# Patient Record
Sex: Male | Born: 1940
Health system: Southern US, Community
[De-identification: ages and names within clinical notes are randomized; demographics above are authoritative.]

## PROBLEM LIST (undated history)

## (undated) DIAGNOSIS — I484 Atypical atrial flutter: Secondary | ICD-10-CM

## (undated) DIAGNOSIS — K5900 Constipation, unspecified: Secondary | ICD-10-CM

## (undated) DIAGNOSIS — G629 Polyneuropathy, unspecified: Secondary | ICD-10-CM

## (undated) DIAGNOSIS — I739 Peripheral vascular disease, unspecified: Secondary | ICD-10-CM

## (undated) DIAGNOSIS — I2510 Atherosclerotic heart disease of native coronary artery without angina pectoris: Secondary | ICD-10-CM

## (undated) DIAGNOSIS — I779 Disorder of arteries and arterioles, unspecified: Secondary | ICD-10-CM

## (undated) DIAGNOSIS — F419 Anxiety disorder, unspecified: Secondary | ICD-10-CM

## (undated) DIAGNOSIS — R011 Cardiac murmur, unspecified: Secondary | ICD-10-CM

## (undated) DIAGNOSIS — I10 Essential (primary) hypertension: Secondary | ICD-10-CM

## (undated) DIAGNOSIS — Z952 Presence of prosthetic heart valve: Secondary | ICD-10-CM

## (undated) DIAGNOSIS — M1710 Unilateral primary osteoarthritis, unspecified knee: Secondary | ICD-10-CM

## (undated) DIAGNOSIS — H9190 Unspecified hearing loss, unspecified ear: Secondary | ICD-10-CM

## (undated) DIAGNOSIS — I350 Nonrheumatic aortic (valve) stenosis: Secondary | ICD-10-CM

## (undated) HISTORY — DX: Polyneuropathy, unspecified: G62.9

## (undated) HISTORY — PX: COLONOSCOPY: SHX174

## (undated) HISTORY — DX: Cardiac murmur, unspecified: R01.1

## (undated) HISTORY — DX: Nonrheumatic aortic (valve) stenosis: I35.0

## (undated) HISTORY — PX: EYE SURGERY: SHX253

## (undated) HISTORY — DX: Peripheral vascular disease, unspecified: I73.9

## (undated) HISTORY — DX: Disorder of arteries and arterioles, unspecified: I77.9

## (undated) HISTORY — DX: Unilateral primary osteoarthritis, unspecified knee: M17.10

## (undated) HISTORY — DX: Essential (primary) hypertension: I10

## (undated) HISTORY — DX: Anxiety disorder, unspecified: F41.9

## (undated) HISTORY — DX: Atypical atrial flutter: I48.4

## (undated) HISTORY — PX: HERNIA REPAIR: SHX51

---

## 2001-02-07 ENCOUNTER — Other Ambulatory Visit: Admission: RE | Admit: 2001-02-07 | Discharge: 2001-02-07 | Payer: Self-pay | Admitting: Dermatology

## 2003-11-26 ENCOUNTER — Other Ambulatory Visit: Admission: RE | Admit: 2003-11-26 | Discharge: 2003-11-26 | Payer: Self-pay | Admitting: Dermatology

## 2005-04-30 ENCOUNTER — Ambulatory Visit: Payer: Self-pay | Admitting: Gastroenterology

## 2006-06-11 ENCOUNTER — Ambulatory Visit (HOSPITAL_COMMUNITY): Admission: RE | Admit: 2006-06-11 | Discharge: 2006-06-11 | Payer: Self-pay | Admitting: Family Medicine

## 2006-07-12 ENCOUNTER — Ambulatory Visit (HOSPITAL_COMMUNITY): Admission: RE | Admit: 2006-07-12 | Discharge: 2006-07-12 | Payer: Self-pay | Admitting: Family Medicine

## 2006-10-18 ENCOUNTER — Ambulatory Visit: Payer: Self-pay | Admitting: Emergency Medicine

## 2007-09-27 ENCOUNTER — Ambulatory Visit: Payer: Self-pay | Admitting: Family Medicine

## 2008-10-18 ENCOUNTER — Ambulatory Visit (HOSPITAL_COMMUNITY): Admission: RE | Admit: 2008-10-18 | Discharge: 2008-10-18 | Payer: Self-pay | Admitting: Family Medicine

## 2009-04-13 HISTORY — PX: NECK SURGERY: SHX720

## 2010-10-14 ENCOUNTER — Ambulatory Visit: Payer: Self-pay | Admitting: Internal Medicine

## 2011-09-06 ENCOUNTER — Ambulatory Visit: Payer: Self-pay | Admitting: Medical

## 2011-09-14 ENCOUNTER — Ambulatory Visit: Payer: Self-pay

## 2013-02-09 ENCOUNTER — Ambulatory Visit: Payer: Self-pay

## 2013-08-09 ENCOUNTER — Ambulatory Visit: Payer: Self-pay | Admitting: Ophthalmology

## 2013-09-06 ENCOUNTER — Ambulatory Visit: Payer: Self-pay | Admitting: Ophthalmology

## 2013-11-06 LAB — HEMOGLOBIN: Hemoglobin: 14.6 (ref 13–17)

## 2013-11-06 LAB — LIPID PANEL
Chol/HDL Ratio: 2.4
Cholesterol, Total: 171 (ref ?–200)
HDL Cholesterol: 72 (ref 39–?)
LDL Cholesterol: 90 mg/dL (ref ?–100)
Triglycerides: 45 (ref ?–150)
VLDL: 9 mg/dL (ref ?–31)

## 2013-11-06 LAB — PSA: PSA: 2.3 (ref ?–4)

## 2013-11-06 LAB — TESTOSTERONE: Testosterone, Serum (Total): 495 (ref 300–890)

## 2013-12-13 ENCOUNTER — Encounter: Payer: Self-pay | Admitting: Podiatry

## 2013-12-13 ENCOUNTER — Other Ambulatory Visit: Payer: Self-pay | Admitting: *Deleted

## 2013-12-13 ENCOUNTER — Ambulatory Visit (INDEPENDENT_AMBULATORY_CARE_PROVIDER_SITE_OTHER): Payer: Medicare Other | Admitting: Podiatry

## 2013-12-13 VITALS — BP 137/73 | HR 75 | Resp 16 | Ht 74.0 in | Wt 182.0 lb

## 2013-12-13 DIAGNOSIS — M216X9 Other acquired deformities of unspecified foot: Secondary | ICD-10-CM

## 2013-12-13 DIAGNOSIS — M722 Plantar fascial fibromatosis: Secondary | ICD-10-CM

## 2013-12-13 DIAGNOSIS — M2040 Other hammer toe(s) (acquired), unspecified foot: Secondary | ICD-10-CM

## 2013-12-13 DIAGNOSIS — M2020 Hallux rigidus, unspecified foot: Secondary | ICD-10-CM

## 2013-12-13 DIAGNOSIS — M205X9 Other deformities of toe(s) (acquired), unspecified foot: Secondary | ICD-10-CM

## 2013-12-13 NOTE — Progress Notes (Signed)
   Subjective:    Patient ID: James Cardenas, male    DOB: 03-17-1941, 73 y.o.   MRN: [id number]  HPI Comments: Need another pair of orthotics the last pair was made in 2013 , switch them out a lot in different shoes. Had a question about the fungus.  Been using formula 3 on them , denies any pain in feet      Review of Systems     Objective:   Physical Exam: I have reviewed his past mental history medications allergies surgeries social history and review of systems. Pulses are strongly palpable bilateral. Neurologic sensorium is intact per since once the monofilament. Deep tendon reflexes are intact bilateral muscle strength is 5 over 5 dorsiflexors plantar flexors inverters everters all intrinsic musculature is intact. Orthopedic evaluation demonstrates all joints distal to the ankle a full range of motion without crepitation. Cutaneous evaluation demonstrates supple well hydrated cutis. Cavus foot deformity with hammertoe deformities are noted bilateral history of plantar fasciitis is present but not symptomatically this point.        Assessment & Plan:  Assessment: History of plantar fasciitis cavus foot deformity with hammertoe deformities bilateral.  Plan: New set up orthotics were scan today.

## 2014-01-01 ENCOUNTER — Encounter: Payer: Self-pay | Admitting: *Deleted

## 2014-01-17 ENCOUNTER — Ambulatory Visit (INDEPENDENT_AMBULATORY_CARE_PROVIDER_SITE_OTHER): Payer: Medicare Other | Admitting: Podiatry

## 2014-01-17 DIAGNOSIS — M722 Plantar fascial fibromatosis: Secondary | ICD-10-CM

## 2014-01-17 NOTE — Progress Notes (Signed)
Pt presents for orthotic pick up , patient states and feels good

## 2014-01-24 DIAGNOSIS — I10 Essential (primary) hypertension: Secondary | ICD-10-CM | POA: Insufficient documentation

## 2014-01-24 DIAGNOSIS — R011 Cardiac murmur, unspecified: Secondary | ICD-10-CM | POA: Insufficient documentation

## 2015-11-09 LAB — BASIC METABOLIC PANEL
BUN/Creatinine Ratio: 22
BUN: 20
Calcium: 8.9 mg/dL
Carbon Dioxide, Total: 25
Chloride: 101 mmol/L
Creat: 0.89
EGFR (Non-African Amer.): 84
Glucose: 105 — AB (ref ?–99)
Potassium: 4.5 mmol/L
Sodium: 139

## 2015-11-09 LAB — HEMOGLOBIN A1C: Hemoglobin A1C: 5.4 (ref 4.8–5.6)

## 2016-03-11 ENCOUNTER — Encounter: Payer: Self-pay | Admitting: Urology

## 2016-03-11 ENCOUNTER — Ambulatory Visit (INDEPENDENT_AMBULATORY_CARE_PROVIDER_SITE_OTHER): Payer: Medicare Other | Admitting: Urology

## 2016-03-11 ENCOUNTER — Other Ambulatory Visit: Payer: Self-pay

## 2016-03-11 VITALS — BP 156/78 | HR 77 | Ht 74.0 in | Wt 200.0 lb

## 2016-03-11 DIAGNOSIS — N400 Enlarged prostate without lower urinary tract symptoms: Secondary | ICD-10-CM | POA: Insufficient documentation

## 2016-03-11 DIAGNOSIS — N3281 Overactive bladder: Secondary | ICD-10-CM

## 2016-03-11 DIAGNOSIS — N5201 Erectile dysfunction due to arterial insufficiency: Secondary | ICD-10-CM | POA: Diagnosis not present

## 2016-03-11 DIAGNOSIS — N401 Enlarged prostate with lower urinary tract symptoms: Secondary | ICD-10-CM | POA: Diagnosis not present

## 2016-03-11 DIAGNOSIS — R350 Frequency of micturition: Secondary | ICD-10-CM | POA: Diagnosis not present

## 2016-03-11 LAB — BLADDER SCAN AMB NON-IMAGING: Scan Result: 37

## 2016-03-11 MED ORDER — MIRABEGRON ER 25 MG PO TB24: 25.0000 mg | ORAL_TABLET | Freq: Every day | ORAL | 11 refills | Status: DC

## 2016-03-11 NOTE — Progress Notes (Signed)
03/11/2016 11:05 AM   James Cardenas 09-Apr-1941 [id number]  Referring provider: Mariana Arn, MD [address] [place], [place] [address]  Chief Complaint  Patient presents with  . New Patient (Initial Visit)    Frequency    HPI: The patient is a 75 year old gentleman presents today to discuss his BPH with lower urinary tract symptoms. He is currently on Flomax 0.8 mg daily. He was on Flomax 0.4 mg daily this was increased about 1 week ago. He saw no change in his symptoms. His symptoms include urinary frequency up to 10 times per day, urgency with urge incontinence, nocturia 3. He denies any hesitancy, weak stream, intermittency, or having to strain to urinate.  He also takes generic sildenafil for erectile dysfunction which works well for him.   PMH: Past Medical History:  Diagnosis Date  . Hypertension     Surgical History: Past Surgical History:  Procedure Laterality Date  . NECK SURGERY      Home Medications:    Medication List       Accurate as of 03/11/16 11:05 AM. Always use your most recent med list.          ALPRAZolam 0.5 MG tablet Commonly known as:  XANAX TAKE 1 TABLET BY MOUTH 3 TIMES A DAY AS NEEDED FOR ANXIETY   aspirin EC 81 MG tablet Take by mouth.   losartan 50 MG tablet Commonly known as:  COZAAR Take 50 mg by mouth daily.   MULTI-VITAMINS Tabs Take by mouth.   Omega-3 1000 MG Caps Take by mouth.   sildenafil 20 MG tablet Commonly known as:  REVATIO Take 20 mg by mouth 3 (three) times daily.   tamsulosin 0.4 MG Caps capsule Commonly known as:  FLOMAX Take 0.8 mg by mouth daily.   Vitamin B 12 100 MCG Lozg Take by mouth.       Allergies: No Known Allergies  Family History: Family History  Problem Relation Age of Onset  . Prostate cancer Neg Hx   . Bladder Cancer Neg Hx   . Kidney cancer Neg Hx     Social History:  reports that he has never smoked. He has never used smokeless tobacco. He reports that he drinks  about 4.2 oz of alcohol per week . He reports that he does not use drugs.  ROS: UROLOGY Frequent Urination?: Yes Hard to postpone urination?: No Burning/pain with urination?: No Get up at night to urinate?: Yes Leakage of urine?: Yes Urine stream starts and stops?: No Trouble starting stream?: No Do you have to strain to urinate?: No Blood in urine?: No Urinary tract infection?: No Sexually transmitted disease?: No Injury to kidneys or bladder?: No Painful intercourse?: No Weak stream?: No Erection problems?: Yes Penile pain?: No  Gastrointestinal Nausea?: No Vomiting?: No Indigestion/heartburn?: No Diarrhea?: No Constipation?: No  Constitutional Fever: No Night sweats?: No Weight loss?: No Fatigue?: No  Skin Skin rash/lesions?: No Itching?: No  Eyes Blurred vision?: No Double vision?: No  Ears/Nose/Throat Sore throat?: No Sinus problems?: No  Hematologic/Lymphatic Swollen glands?: No Easy bruising?: No  Cardiovascular Leg swelling?: No Chest pain?: No  Respiratory Cough?: No Shortness of breath?: No  Endocrine Excessive thirst?: No  Musculoskeletal Back pain?: No Joint pain?: No  Neurological Headaches?: No Dizziness?: No  Psychologic Depression?: No Anxiety?: Yes  Physical Exam: BP (!) 156/78 (BP Location: Left Arm, Patient Position: Sitting, Cuff Size: Normal)   Pulse 77   Ht 6\' 2"  (1.88 m)   Wt  200 lb (90.7 kg)   BMI 25.68 kg/m   Constitutional:  Alert and oriented, No acute distress. HEENT: [place] AT, moist mucus membranes.  Trachea midline, no masses. Cardiovascular: No clubbing, cyanosis, or edema. Respiratory: Normal respiratory effort, no increased work of breathing. GI: Abdomen is soft, nontender, nondistended, no abdominal masses GU: No CVA tenderness. Normal phallus. Testicles descended bilaterally no masses. DRE: 2+ smooth benign. Skin: No rashes, bruises or suspicious lesions. Lymph: No cervical or inguinal  adenopathy. Neurologic: Grossly intact, no focal deficits, moving all 4 extremities. Psychiatric: Normal mood and affect.  Laboratory Data: No results found for: WBC, HGB, HCT, MCV, PLT  No results found for: CREATININE  No results found for: PSA  No results found for: TESTOSTERONE  No results found for: HGBA1C  Urinalysis No results found for: COLORURINE, APPEARANCEUR, LABSPEC, PHURINE, GLUCOSEU, HGBUR, BILIRUBINUR, KETONESUR, PROTEINUR, UROBILINOGEN, NITRITE, LEUKOCYTESUR   Assessment & Plan:    1. OAB Based on the patient's symptomatology, especially his urge incontinence,  are more consistant with an overactive bladder. We will try him on Myrbetriq 25 mg daily for this condition. He'll contact the office of this medication is too expensive to fill. A follow-up in 3 months to assess his progress. He will continue his Flomax and 0.4 mg a day since he did see some improvement in his symptoms with that but noticed no change with Flomax 0.8 mg.  2. BPH As above  3. ED Continue silidenfil per PCP  No Follow-up on file.  Nickie Retort, MD  [facility] [address] [place], [place] [address] [phone]

## 2016-04-14 ENCOUNTER — Other Ambulatory Visit: Payer: Self-pay | Admitting: Family Medicine

## 2016-04-14 MED ORDER — TAMSULOSIN HCL 0.4 MG PO CAPS: 0.8000 mg | ORAL_CAPSULE | Freq: Every day | ORAL | 3 refills | Status: DC

## 2016-04-15 ENCOUNTER — Ambulatory Visit (INDEPENDENT_AMBULATORY_CARE_PROVIDER_SITE_OTHER): Payer: Medicare Other

## 2016-04-15 ENCOUNTER — Encounter: Payer: Self-pay | Admitting: Podiatry

## 2016-04-15 ENCOUNTER — Ambulatory Visit (INDEPENDENT_AMBULATORY_CARE_PROVIDER_SITE_OTHER): Payer: Medicare Other | Admitting: Podiatry

## 2016-04-15 VITALS — BP 147/81 | HR 62 | Resp 16

## 2016-04-15 DIAGNOSIS — B351 Tinea unguium: Secondary | ICD-10-CM

## 2016-04-15 DIAGNOSIS — M722 Plantar fascial fibromatosis: Secondary | ICD-10-CM

## 2016-04-15 DIAGNOSIS — M79676 Pain in unspecified toe(s): Secondary | ICD-10-CM

## 2016-04-15 NOTE — Progress Notes (Signed)
   Subjective:    Patient ID: James Cardenas, male    DOB: 07/30/1940, 76 y.o.   MRN: [id number]  HPI: He presents today requesting new orthotics and requesting to have helped trimming his toenails. He has a history of plantar fasciitis and hammertoe deformity which she would like to have help with is well.  Review of Systems  Genitourinary: Positive for frequency.  Musculoskeletal: Positive for arthralgias.  All other systems reviewed and are negative.      Objective:   Physical Exam: Vital signs are stable alert and oriented 3. Pulses are palpable. Neurologic sensorium is intact deep tendon reflexes are intact muscle strength is normal bilateral. Orthopedic evaluation demonstrates rigid hammertoe deformities mild HAV deformities bilateral. Reactive tyloma was last flex to the bilateral foot. Mild tenderness on palpation plantar fascial calcaneal exercise bilateral. Radiographs demonstrate hammertoe deformities plan flex metatarsals.     Assessment & Plan:  Assessment: Hammertoe deformities plantar fasciitis bilateral. Painful elongated toenails bilateral.  Plan: Debridement of toenails 1 through 5 bilateral. He was rescanned for per orthotics today.

## 2016-05-04 ENCOUNTER — Other Ambulatory Visit: Payer: Self-pay

## 2016-05-04 DIAGNOSIS — N3281 Overactive bladder: Secondary | ICD-10-CM

## 2016-05-04 MED ORDER — MIRABEGRON ER 25 MG PO TB24: 25.0000 mg | ORAL_TABLET | Freq: Every day | ORAL | 3 refills | Status: DC

## 2016-05-28 DIAGNOSIS — R011 Cardiac murmur, unspecified: Secondary | ICD-10-CM | POA: Diagnosis not present

## 2016-05-28 DIAGNOSIS — N528 Other male erectile dysfunction: Secondary | ICD-10-CM | POA: Diagnosis not present

## 2016-05-28 DIAGNOSIS — I10 Essential (primary) hypertension: Secondary | ICD-10-CM | POA: Diagnosis not present

## 2016-05-28 DIAGNOSIS — N529 Male erectile dysfunction, unspecified: Secondary | ICD-10-CM | POA: Insufficient documentation

## 2016-05-28 DIAGNOSIS — R600 Localized edema: Secondary | ICD-10-CM | POA: Diagnosis not present

## 2016-05-28 DIAGNOSIS — N400 Enlarged prostate without lower urinary tract symptoms: Secondary | ICD-10-CM | POA: Diagnosis not present

## 2016-05-28 DIAGNOSIS — F411 Generalized anxiety disorder: Secondary | ICD-10-CM | POA: Diagnosis not present

## 2016-05-28 DIAGNOSIS — N3281 Overactive bladder: Secondary | ICD-10-CM | POA: Diagnosis not present

## 2016-05-31 DIAGNOSIS — M1611 Unilateral primary osteoarthritis, right hip: Secondary | ICD-10-CM | POA: Diagnosis not present

## 2016-05-31 DIAGNOSIS — M25651 Stiffness of right hip, not elsewhere classified: Secondary | ICD-10-CM | POA: Diagnosis not present

## 2016-05-31 DIAGNOSIS — M1711 Unilateral primary osteoarthritis, right knee: Secondary | ICD-10-CM | POA: Diagnosis not present

## 2016-05-31 DIAGNOSIS — M25561 Pain in right knee: Secondary | ICD-10-CM | POA: Diagnosis not present

## 2016-06-11 ENCOUNTER — Encounter: Payer: Self-pay | Admitting: Urology

## 2016-06-11 ENCOUNTER — Ambulatory Visit (INDEPENDENT_AMBULATORY_CARE_PROVIDER_SITE_OTHER): Payer: Medicare Other | Admitting: Urology

## 2016-06-11 VITALS — BP 156/83 | HR 66 | Ht 74.0 in | Wt 203.1 lb

## 2016-06-11 DIAGNOSIS — N401 Enlarged prostate with lower urinary tract symptoms: Secondary | ICD-10-CM

## 2016-06-11 DIAGNOSIS — N3281 Overactive bladder: Secondary | ICD-10-CM | POA: Diagnosis not present

## 2016-06-11 DIAGNOSIS — N529 Male erectile dysfunction, unspecified: Secondary | ICD-10-CM | POA: Diagnosis not present

## 2016-06-11 LAB — URINALYSIS, COMPLETE
Bilirubin, UA: NEGATIVE
Glucose, UA: NEGATIVE
Ketones, UA: NEGATIVE
Leukocytes, UA: NEGATIVE
Nitrite, UA: NEGATIVE
Protein, UA: NEGATIVE
Specific Gravity, UA: 1.025 (ref 1.005–1.030)
Urobilinogen, Ur: 1 mg/dL (ref 0.2–1.0)
pH, UA: 5.5 (ref 5.0–7.5)

## 2016-06-11 LAB — MICROSCOPIC EXAMINATION
Bacteria, UA: NONE SEEN
Epithelial Cells (non renal): NONE SEEN /hpf (ref 0–10)

## 2016-06-11 NOTE — Progress Notes (Signed)
06/11/2016 11:32 AM   James Cardenas [birth year] [id number]  Referring provider: Sharilyn Sites, MD [address] [place], [place] [address]  Chief Complaint  Patient presents with  . Follow-up    BPH w/lower UTS    HPI: The patient is a 76 year old gentleman presents today to discuss his BPH with lower urinary tract symptoms. He is currently on Flomax 0.8 mg daily. He was on Flomax 0.4 mg daily this was increased about 1 week ago. He saw no change in his symptoms. His symptoms include urinary frequency up to 10 times per day, urgency with urge incontinence, nocturia 3. He denies any hesitancy, weak stream, intermittency, or having to strain to urinate. DRE was 2+ benign. He was started on Myrbetriq 25 mg at his last visit for these symptoms.He has noted dramatic improvement with this medication. I PSS score today now is 3/1. He has minimal urgency and nocturia 1-2. He is very pleased with the change this medication has provider. His urge incontinence has essentially resolved. He decreased his Flomax to 0.4 mg as it was not helping him. He still does not think this medication has ever helped with his issues and is interested in stopping it today.  He also takes generic sildenafil for erectile dysfunction which works well for him.     PMH: Past Medical History:  Diagnosis Date  . Hypertension     Surgical History: Past Surgical History:  Procedure Laterality Date  . NECK SURGERY      Home Medications:  Allergies as of 06/11/2016   No Known Allergies     Medication List       Accurate as of 06/11/16 11:32 AM. Always use your most recent med list.          ALPRAZolam 0.5 MG tablet Commonly known as:  XANAX TAKE 1 TABLET BY MOUTH 3 TIMES A DAY AS NEEDED FOR ANXIETY   aspirin EC 81 MG tablet Take by mouth.   FINACEA 15 % Foam Generic drug:  Azelaic Acid Apply AS directed TO FACE daily   losartan 50 MG tablet Commonly known as:  COZAAR Take 50 mg by mouth  daily.   mirabegron ER 25 MG Tb24 tablet Commonly known as:  MYRBETRIQ Take 1 tablet (25 mg total) by mouth daily.   MULTI-VITAMINS Tabs Take by mouth.   Omega-3 1000 MG Caps Take by mouth.   sildenafil 20 MG tablet Commonly known as:  REVATIO Take 20 mg by mouth 3 (three) times daily.   tamsulosin 0.4 MG Caps capsule Commonly known as:  FLOMAX Take 2 capsules (0.8 mg total) by mouth daily.   Vitamin B 12 100 MCG Lozg Take by mouth.       Allergies: No Known Allergies  Family History: Family History  Problem Relation Age of Onset  . Prostate cancer Neg Hx   . Bladder Cancer Neg Hx   . Kidney cancer Neg Hx     Social History:  reports that he has never smoked. He has never used smokeless tobacco. He reports that he drinks about 4.2 oz of alcohol per week . He reports that he does not use drugs.  ROS: UROLOGY Frequent Urination?: No Hard to postpone urination?: No Burning/pain with urination?: No Get up at night to urinate?: Yes Leakage of urine?: No Urine stream starts and stops?: No Trouble starting stream?: No Do you have to strain to urinate?: No Blood in urine?: No Urinary tract infection?: No Sexually transmitted disease?: No Injury to  kidneys or bladder?: No Painful intercourse?: No Weak stream?: No Erection problems?: Yes Penile pain?: No  Gastrointestinal Nausea?: No Vomiting?: No Indigestion/heartburn?: No Diarrhea?: No Constipation?: Yes  Constitutional Fever: No Night sweats?: No Weight loss?: No Fatigue?: No  Skin Skin rash/lesions?: No Itching?: No  Eyes Blurred vision?: No Double vision?: No  Ears/Nose/Throat Sore throat?: No Sinus problems?: No  Hematologic/Lymphatic Swollen glands?: No Easy bruising?: No  Cardiovascular Leg swelling?: No Chest pain?: No  Respiratory Cough?: No Shortness of breath?: No  Endocrine Excessive thirst?: No  Musculoskeletal Back pain?: No Joint pain?:  No  Neurological Headaches?: No Dizziness?: No  Psychologic Depression?: No Anxiety?: Yes  Physical Exam: BP (!) 156/83   Pulse 66   Ht 6\' 2"  (1.88 m)   Wt 203 lb 1.6 oz (92.1 kg)   BMI 26.08 kg/m   Constitutional:  Alert and oriented, No acute distress. HEENT: [place] AT, moist mucus membranes.  Trachea midline, no masses. Cardiovascular: No clubbing, cyanosis, or edema. Respiratory: Normal respiratory effort, no increased work of breathing. GI: Abdomen is soft, nontender, nondistended, no abdominal masses GU: No CVA tenderness.  Skin: No rashes, bruises or suspicious lesions. Lymph: No cervical or inguinal adenopathy. Neurologic: Grossly intact, no focal deficits, moving all 4 extremities. Psychiatric: Normal mood and affect.  Laboratory Data: No results found for: WBC, HGB, HCT, MCV, PLT  No results found for: CREATININE  No results found for: PSA  No results found for: TESTOSTERONE  No results found for: HGBA1C  Urinalysis No results found for: COLORURINE, APPEARANCEUR, LABSPEC, PHURINE, GLUCOSEU, HGBUR, BILIRUBINUR, KETONESUR, PROTEINUR, UROBILINOGEN, NITRITE, LEUKOCYTESUR    Assessment & Plan:    1. OAB Continue Myrbetriq 25 mg daily  2. BPH This medication has not helped the patient's symptoms and he would like to stop it at this time. I think this is reasonable.  3. ED Continue silidenfil per PCP   Return in about 1 year (around 06/11/2017).  Nickie Retort, MD  [facility] [address] [place], [place] [address] [phone]

## 2016-06-22 DIAGNOSIS — M1711 Unilateral primary osteoarthritis, right knee: Secondary | ICD-10-CM | POA: Diagnosis not present

## 2016-07-02 ENCOUNTER — Encounter: Payer: Self-pay | Admitting: Family Medicine

## 2016-07-02 ENCOUNTER — Ambulatory Visit (INDEPENDENT_AMBULATORY_CARE_PROVIDER_SITE_OTHER): Payer: Medicare Other | Admitting: Family Medicine

## 2016-07-02 ENCOUNTER — Other Ambulatory Visit: Payer: Self-pay | Admitting: *Deleted

## 2016-07-02 VITALS — BP 144/71 | HR 59 | Temp 97.7°F | Resp 16 | Ht 74.0 in | Wt 202.0 lb

## 2016-07-02 DIAGNOSIS — K5909 Other constipation: Secondary | ICD-10-CM

## 2016-07-02 DIAGNOSIS — R011 Cardiac murmur, unspecified: Secondary | ICD-10-CM | POA: Diagnosis not present

## 2016-07-02 DIAGNOSIS — M150 Primary generalized (osteo)arthritis: Secondary | ICD-10-CM

## 2016-07-02 DIAGNOSIS — G609 Hereditary and idiopathic neuropathy, unspecified: Secondary | ICD-10-CM | POA: Diagnosis not present

## 2016-07-02 DIAGNOSIS — F418 Other specified anxiety disorders: Secondary | ICD-10-CM | POA: Insufficient documentation

## 2016-07-02 DIAGNOSIS — Z23 Encounter for immunization: Secondary | ICD-10-CM

## 2016-07-02 DIAGNOSIS — K5900 Constipation, unspecified: Secondary | ICD-10-CM | POA: Insufficient documentation

## 2016-07-02 DIAGNOSIS — M1711 Unilateral primary osteoarthritis, right knee: Secondary | ICD-10-CM | POA: Insufficient documentation

## 2016-07-02 DIAGNOSIS — N3281 Overactive bladder: Secondary | ICD-10-CM

## 2016-07-02 DIAGNOSIS — N400 Enlarged prostate without lower urinary tract symptoms: Secondary | ICD-10-CM | POA: Diagnosis not present

## 2016-07-02 DIAGNOSIS — M8949 Other hypertrophic osteoarthropathy, multiple sites: Secondary | ICD-10-CM

## 2016-07-02 DIAGNOSIS — Z7689 Persons encountering health services in other specified circumstances: Secondary | ICD-10-CM

## 2016-07-02 DIAGNOSIS — I7090 Unspecified atherosclerosis: Secondary | ICD-10-CM | POA: Insufficient documentation

## 2016-07-02 DIAGNOSIS — R600 Localized edema: Secondary | ICD-10-CM | POA: Insufficient documentation

## 2016-07-02 DIAGNOSIS — I10 Essential (primary) hypertension: Secondary | ICD-10-CM

## 2016-07-02 DIAGNOSIS — G629 Polyneuropathy, unspecified: Secondary | ICD-10-CM | POA: Insufficient documentation

## 2016-07-02 DIAGNOSIS — R7301 Impaired fasting glucose: Secondary | ICD-10-CM

## 2016-07-02 DIAGNOSIS — N529 Male erectile dysfunction, unspecified: Secondary | ICD-10-CM | POA: Diagnosis not present

## 2016-07-02 DIAGNOSIS — M159 Polyosteoarthritis, unspecified: Secondary | ICD-10-CM | POA: Insufficient documentation

## 2016-07-02 MED ORDER — LOSARTAN POTASSIUM 100 MG PO TABS: 100.0000 mg | ORAL_TABLET | Freq: Every day | ORAL | 3 refills | Status: DC

## 2016-07-02 MED ORDER — SILDENAFIL CITRATE 20 MG PO TABS: ORAL_TABLET | ORAL | 11 refills | Status: DC

## 2016-07-02 NOTE — Assessment & Plan Note (Signed)
Suspected functional constipation, likely med side effects on Myrbetriq - Recommend improve lifestyle, hydration, fiber - Can try OTC Docusate stool softener nightly 100mg , then if prefer can advance to Miralax OTC half to 1 cap powder full glass water, daily or maintenance or PRN adjustment

## 2016-07-02 NOTE — Assessment & Plan Note (Signed)
Stable chronic problem Now patient concern with reported increased murmur in past 2 years Last ECHO 2015 (LVEF 55%, mild MR, Moderate AS and AR) Will follow-up in future, consider repeat ECHO now in 2018

## 2016-07-02 NOTE — Assessment & Plan Note (Signed)
Consistent with likely age-related, possibly with HTN. Also consider possible neurogenic if has peripheral neuropathy. Otheriwse no DM, no significant vascular disease with normal cholesterol.  Plan: 1. Refilled generic Sildenafil 20mg  tabs, take 1-5 tabs (patient can continue to take 4-5 per dose in 24 hours), cautioned on side effects when to stop and seek immediate treatment - #50 tabs with +11 refills, printed - at discount pharmacy 2. Follow-up as needed

## 2016-07-02 NOTE — Patient Instructions (Signed)
Thank you for coming in to clinic today.  1. Keep up the good work, try to resume regular exercise and healthy  2. Refilled Sildenafil and Losartan - Keep checking BP occasionally, bring it in to review as needed  3. As discussed with anxiety - We can certainly continue your current treatment with Alprazolam as long as you are cautious with taking it only as needed, and not having side effects or complications - Keep using your existing pills for now, we do not prescribe this on the first visit  - Consider the following medications, read about these - Celexa (Citalopram), Lexapro (Escitalopram) - (preferred options) - Other options Zoloft (Sertraline), Duloxetine (Cymbalta)  For the neuropathy as discussed we can consider further evaluation and options - We will check some blood work again to make sure we aren't missing anything as the cause - Treatment options include Gabapentin (nerve pain medicine) start low doses and gradually increase, then the advanced version of this is Lyrica (this is controlled as well   Recommend start taking the baby Aspirin 81mg  daily to reduce risk of heart attack and stroke  Consider future discussion of Statin therapy (cholesterol lowering meds that also reduce risk of stroke/heart attack) - such as Rosuvastatin, Crestor, Atorvastatin, Lipitor   You will be due for FASTING BLOOD WORK (no food or drink after midnight before, only water or coffee without cream/sugar on the morning of)  - Please go ahead and schedule a "Lab Only" visit in the morning at the clinic for lab draw in 3 months before next Annual Physical - Make sure Lab Only appointment is at least 1-2 weeks before your next appointment, so that results will be available  For Lab Results, once available within 2-3 days of blood draw, you can can log in to MyChart online to view your results and a brief explanation. Also, we can discuss results at next follow-up visit.   Please schedule a  follow-up appointment with Dr. Parks Ranger in 3 months for Annual Physical, lab results, Neuropathy/Anxiety  If you have any other questions or concerns, please feel free to call the clinic or send a message through [place]. You may also schedule an earlier appointment if necessary.  [name], DO [place]

## 2016-07-02 NOTE — Assessment & Plan Note (Signed)
Stable chronic problem Improved with compression Follow-up labs, and discussion further at next visit, consider repeat ECHO

## 2016-07-02 NOTE — Assessment & Plan Note (Signed)
Mild elevated BP today, reviewed extensive home BP log well controlled No known complications  Plan: 1. Refilled Losartan 100mg  daily - advised continue this option as opposed to reduce back to 50mg , inc BP 130-140+ at this dose. He is asymptomatic on current dose. 2. Encouraged healthy lifestyle - resume exercise 3. Reviewed prior labs including last chemistry 05/2016, normal Cr 4. Follow-up 3 months for Annual Physical

## 2016-07-02 NOTE — Assessment & Plan Note (Signed)
Stable, without complication Followed by Urology ([facility]) - Last DRE mild +2 (2017), PSA 2.3 (2015) Off Flomax now, without problems Seems LUTS were mostly due to OAB, now improved on Myrbetriq

## 2016-07-02 NOTE — Assessment & Plan Note (Signed)
Stable, chronic >10 year situational anxiety, not consistent with GAD or panic disorder. No co-morbid depression / mood disorder. -GAD7: 1, not difficult / PHQ2: 0 - Controlled on PRN Xanax 0.5 - Failed: Prozac (side effects, did not like "anti-depressant") - No prior Psych / counseling - Checked [place] CSRS for past 1 year today 07/02/16 - only one refilled Xanax in 2017, previously 90 tab rx, no red flags, confirms infrequent use  Plan: 1. Discussion on chronic anxiety management, meds - focus on SSRI vs BDZ. He did not seem to be fully aware of differences, and actually had misinformation regarding safety of SSRI vs BDZ, he was concerned about "dependence" on a daily med. Given his infrequent situational anxiety and well control on low dose Xanax without withdrawal symptoms, seems reasonable to continue this with close monitoring. He understands risks of this medication now with regards to dependence, abuse potential, side effects, not daily long-term option, he understands I do not rx it on first visit, and he can continue his current rx Xanax PRN until next visit - if needing higher doses or other adjustments, may no longer prescribe this 2. Follow-up 3 months for Annual Physical - can review anxiety control at that time again, if needed refill will print at that time for infrequent PRN use only, he is interested in future trial of different SSRI for more stabilizing anxiety and even less use Xanax

## 2016-07-02 NOTE — Assessment & Plan Note (Signed)
Unclear exact etiology, bilateral stocking glove pattern, no other neuro deficits or CNS symptoms. Prior A1c 5.4 without known Pre-DM. Mild improvement on B12 supplement, no testing available. - No other significant imaging work-up, nerve testing, or other treatment  Plan: 1. Reviewed overview of neuropathy today - not focus of visit today, as we were establishing care. Reviewed work-up with labs, NCS, and meds gabapentin etc - he will think about about options 2. Future labs ordered including TSH, Vitamin B12, follow-up as planned 3 months for Annual Physical, consider neuro referral at that time, trial on gabapentin

## 2016-07-02 NOTE — Progress Notes (Signed)
Subjective:    Patient ID: James Cardenas, male    DOB: [birth year], 76 y.o.   MRN: [id number]  James Cardenas is a 76 y.o. male presenting on 07/02/2016 for [place] (pt recently moved from [facility] wanted a new PCP )  Previously established with [facility] as PCP, now here to change by patient preference. He moved from [place] about 1 year ago.  HPI   CHRONIC HTN: Reports BP has been well controlled in past. Has been on variable doses of Losartan either 50 to 100mg , more recently over past >1 year has been stable on Losartan 100mg  daily. He checks BP at home regularly at random intervals different times of day, has recorded readings today, avg 110-120s, occasional 130-140, DBP controlled as well. HR 60-80s - Request refill Losartan 90 days   Reports good compliance, took meds today. Tolerating well, w/o complaints.  OAB / Mild BPH without obstruction/LUTS / Erectile Dysfunction Followed by [facility] (Dr Pilar Jarvis) last saw 06/11/16, he was taken off Flomax 0.4mg  daily since no significant relief, thought unlikely to have significant BPH, last DRE +2 benign, and prior PSA has been normal. He was on higher dose Flomax up to 0.8 but had side effects and no improvement. Additionally taking Myrbetriq 25mg  daily doing well with improved urinary symptoms. - Today he reports doing well overall. No significant urinary complaints of urgency, worsening nocturia, dysuria. - Regarding ED, he takes Sildenafil up to 4-5 tablets per dose with very good results, denies any symptoms of chest pain, dyspnea, or other concerns when taking this, requesting printed refill today, he gets discount rx. He remains sexually active with his girlfriend. He is divorced.  Constipation: - Reports some more recent concerns about constipation over past few months, attributes this to new start Myrbetriq, asking if constipation is side effect. Otherwise no significant dietary changes. - He stays hydrated  mostly with water. Does not drink much soda. - Tried OTC laxative occasionally. Asking what to take now.  H/o Heart Murmur, congenital - Reports a chronic history of heart murmur, initially when young very faint 1/6 reported, then recently about 2 years ago stated was heard to be louder up to 2/6, and has had prior ECHO last in 2015 (results in [place] with LVEF 55%, mild MR, Moderate AS and AR, he was advised to repeat ECHO in 2017 but did not do this. - Denies any new symptoms from murmur today  Atherosclerotic Cardiovascular Risk / No history of Hyperlipidemia: - Reports his cholesterol in previous years has been normal. Elevated good HDL and low other numbers, never on statin therapy. He is taking Omega 3 fish oil twice daily. Last Cholesterol check 10/2013, results entered today. - Asking about resuming baby ASA 86, he took this previously then stopped but interested to resume - No significant history of MI, CVA, HLD  OSTEOARTHRITIS / DJD, Right Knee, Hip, other joints - Reports chronic history of some arthritis, worsening gradually with age. Primarily R knee joint, he is followed by [place] Dorise Hiss PA) last seen 06/22/16, he has been managed with NSAID PRN, and more recently series of steroid injections in R knee, future if not helping plan for synvisc or joint lubricant injection - Takes Ibuprofen 200mg  tabs takes 2 in AM and 2 in PM for knee pain, arthritis - He wears a R knee sleeve often - Uses Blue e-mu for leg and knee pains - Takes Glucosamine Chondroitin regularly - No prior joint  surgery - Previously more active with playing tennis, swimming, and biking, less activity since move to  within pat 1 year  Chronic Anxiety, Situational: - Reports prior anxiety since about 2010, had some minor chronic anxiety in past but was mostly mild, then in 2010 he had neck surgery, and had significant complication and had problems with hyperactivity, had claustrophobia,  easily had flares of anxiety based on certain situations. He describes "flares of anxiety worsening" rarely, intermittent episodes, usually provoked by acute stressor, or situations with lots of people, closed spaces, on airplane, among other scenarios, that trigger his anxiety, and does not experience "full panic attack", otherwise he has minimal mostly controlled anxiety on day to day basis, much improved after retirement. - He has been on Xanax intermittently over past 8+ years, initially was taking 0.5mg  TID for few weeks to months, then tapered down to BID then daily, and then went to PRN only. He has been on PRN xanax 0.5 for several years now and doing very well on it. Takes it variably, sometimes none for few weeks, currently not out, has >50 pills by report from old rx last year, does not believe it is expired. - Additionally other physician has tried to treat him with an "anti-depressant", old records reviewed it was Prozac 20mg . He admits several side effects on this, and ultimately had to stop taking it. - Denies any withdrawal from [place] if does not take it - Denies any depression or sad mood  Peripheral Neuropathy / Leg Swelling - Chronic problem for years, neuropathy in both lower legs ankle to feet, also has some tingling in hands/fingers. Mostly constant some intermittent worsening 70-80% numbness with paresthesia "pins and needles" ankles down to feet. Additionall has soreness and spasm and cramps in legs - He has had work-up in past but no clear answer about his neuropathy, asking several questions today about if swelling causes it. No history of blood sugar problems, never dx with DM - Tried compression socks with good improvement of swelling only. - Only improvement with B12 1000mg  daily, but not sure if low B12 on testing - Never on gabapentin - Admits exercise improves both neuropathy and swelling, states that now both worse since slowed down exercise, tennis, swimming  Health  Maintenance Colonoscopy 2011 - reported normal, no polyps, advised 10 yr next, interested in cologuard in future  Depression screen [facility] 2/9 07/02/2016  Decreased Interest 0  Down, Depressed, Hopeless 0  PHQ - 2 Score 0   GAD 7 : Generalized Anxiety Score 07/02/2016  Nervous, Anxious, on Edge 1  Control/stop worrying 0  Worry too much - different things 0  Trouble relaxing 0  Restless 0  Easily annoyed or irritable 0  Afraid - awful might happen 0  Total GAD 7 Score 1  Anxiety Difficulty Not difficult at all     Past Medical History:  Diagnosis Date  . Hypertension    Past Surgical History:  Procedure Laterality Date  . NECK SURGERY  2011   Social History   Social History  . Marital status: Divorced    Spouse name: N/A  . Number of children: N/A  . Years of education: N/A   Occupational History  . Retired Designer, multimedia, Public affairs consultant / heat treating)    Social History Main Topics  . Smoking status: Never Smoker  . Smokeless tobacco: Never Used  . Alcohol use 4.2 oz/week    7 Glasses of wine per week  . Drug use: No  .  Sexual activity: Yes   Other Topics Concern  . Not on file   Social History Narrative  . No narrative on file   Family History  Problem Relation Age of Onset  . Thyroid disease Mother   . Lung cancer Father   . Pancreatic cancer Brother   . Prostate cancer Neg Hx   . Bladder Cancer Neg Hx   . Kidney cancer Neg Hx    Current Outpatient Prescriptions on File Prior to Visit  Medication Sig  . ALPRAZolam (XANAX) 0.5 MG tablet TAKE 1 TABLET BY MOUTH 3 TIMES A DAY AS NEEDED FOR ANXIETY  . Cyanocobalamin (VITAMIN B 12) 100 MCG LOZG Take by mouth.  Marland Kitchen FINACEA 15 % FOAM Apply AS directed TO FACE daily  . mirabegron ER (MYRBETRIQ) 25 MG TB24 tablet Take 1 tablet (25 mg total) by mouth daily.  . Multiple Vitamin (MULTI-VITAMINS) TABS Take by mouth.  . Omega-3 1000 MG CAPS Take by mouth.  Marland Kitchen aspirin EC 81 MG tablet Take by mouth.   No current  facility-administered medications on file prior to visit.     Review of Systems  Constitutional: Negative for activity change, appetite change, chills, diaphoresis, fatigue, fever and unexpected weight change.  HENT: Negative for congestion, hearing loss and sinus pressure.   Eyes: Negative for visual disturbance.  Respiratory: Negative for cough, chest tightness, shortness of breath and wheezing.   Cardiovascular: Negative for chest pain, palpitations and leg swelling.  Gastrointestinal: Negative for abdominal pain, constipation, diarrhea, nausea and vomiting.  Endocrine: Negative for cold intolerance and polyuria.  Genitourinary: Positive for urgency (improved on medicine). Negative for decreased urine volume, difficulty urinating, dysuria, frequency, hematuria and testicular pain.  Musculoskeletal: Negative for arthralgias, back pain and neck pain.  Skin: Negative for rash.  Allergic/Immunologic: Negative for environmental allergies.  Neurological: Negative for dizziness, weakness, light-headedness, numbness and headaches.  Hematological: Negative for adenopathy.  Psychiatric/Behavioral: Negative for agitation, behavioral problems, decreased concentration, dysphoric mood, self-injury, sleep disturbance and suicidal ideas. The patient is nervous/anxious (mild, with occasional flares based on situations).    Per HPI unless specifically indicated above     Objective:    BP (!) 144/71   Pulse (!) 59   Temp 97.7 F (36.5 C) (Oral)   Resp 16   Ht 6\' 2"  (1.88 m)   Wt 202 lb (91.6 kg)   BMI 25.94 kg/m   Wt Readings from Last 3 Encounters:  07/02/16 202 lb (91.6 kg)  06/11/16 203 lb 1.6 oz (92.1 kg)  03/11/16 200 lb (90.7 kg)    Physical Exam  Constitutional: He is oriented to person, place, and time. He appears well-developed and well-nourished. No distress.  Well-appearing, comfortable, cooperative  HENT:  Head: Normocephalic and atraumatic.  Mouth/Throat: Oropharynx is clear  and moist.  Eyes: Conjunctivae are normal.  Cardiovascular: Normal rate.   Pulmonary/Chest: Effort normal.  Musculoskeletal: Normal range of motion.  Neurological: He is alert and oriented to person, place, and time.  Skin: Skin is warm and dry. No rash noted. He is not diaphoretic. No erythema.  Psychiatric: He has a normal mood and affect. His behavior is normal.  Well groomed, good eye contact, normal speech and thoughts. Mildly anxious appearing at times but overall seems comfortable, seems to have higher energy. Good mood.  Nursing note and vitals reviewed.       Assessment & Plan:   Problem List Items Addressed This Visit    Situational anxiety    Stable, chronic >  10 year situational anxiety, not consistent with GAD or panic disorder. No co-morbid depression / mood disorder. -GAD7: 1, not difficult / PHQ2: 0 - Controlled on PRN Xanax 0.5 - Failed: Prozac (side effects, did not like "anti-depressant") - No prior Psych / counseling - Checked [place] CSRS for past 1 year today 07/02/16 - only one refilled Xanax in 2017, previously 90 tab rx, no red flags, confirms infrequent use  Plan: 1. Discussion on chronic anxiety management, meds - focus on SSRI vs BDZ. He did not seem to be fully aware of differences, and actually had misinformation regarding safety of SSRI vs BDZ, he was concerned about "dependence" on a daily med. Given his infrequent situational anxiety and well control on low dose Xanax without withdrawal symptoms, seems reasonable to continue this with close monitoring. He understands risks of this medication now with regards to dependence, abuse potential, side effects, not daily long-term option, he understands I do not rx it on first visit, and he can continue his current rx Xanax PRN until next visit - if needing higher doses or other adjustments, may no longer prescribe this 2. Follow-up 3 months for Annual Physical - can review anxiety control at that time again, if needed  refill will print at that time for infrequent PRN use only, he is interested in future trial of different SSRI for more stabilizing anxiety and even less use Xanax       Relevant Orders   TSH   Peripheral neuropathy ([place])    Unclear exact etiology, bilateral stocking glove pattern, no other neuro deficits or CNS symptoms. Prior A1c 5.4 without known Pre-DM. Mild improvement on B12 supplement, no testing available. - No other significant imaging work-up, nerve testing, or other treatment  Plan: 1. Reviewed overview of neuropathy today - not focus of visit today, as we were establishing care. Reviewed work-up with labs, NCS, and meds gabapentin etc - he will think about about options 2. Future labs ordered including TSH, Vitamin B12, follow-up as planned 3 months for Annual Physical, consider neuro referral at that time, trial on gabapentin      Relevant Orders   Vitamin B12   TSH   Osteoarthritis of right knee   Osteoarthritis of multiple joints   OAB (overactive bladder)    Stable, improved control on Myrbetriq Followed by Urology ([facility])      Murmur, cardiac    Stable chronic problem Now patient concern with reported increased murmur in past 2 years Last ECHO 2015 (LVEF 55%, mild MR, Moderate AS and AR) Will follow-up in future, consider repeat ECHO now in 2018      Hypertension    Mild elevated BP today, reviewed extensive home BP log well controlled No known complications  Plan: 1. Refilled Losartan 100mg  daily - advised continue this option as opposed to reduce back to 50mg , inc BP 130-140+ at this dose. He is asymptomatic on current dose. 2. Encouraged healthy lifestyle - resume exercise 3. Reviewed prior labs including last chemistry 05/2016, normal Cr 4. Follow-up 3 months for Annual Physical      Relevant Medications   sildenafil (REVATIO) 20 MG tablet   Other Relevant Orders   Lipid panel   ED (erectile dysfunction)    Consistent with likely age-related, possibly  with HTN. Also consider possible neurogenic if has peripheral neuropathy. Otheriwse no DM, no significant vascular disease with normal cholesterol.  Plan: 1. Refilled generic Sildenafil 20mg  tabs, take 1-5 tabs (patient can continue to take 4-5 per  dose in 24 hours), cautioned on side effects when to stop and seek immediate treatment - #50 tabs with +11 refills, printed - at discount pharmacy 2. Follow-up as needed      Relevant Medications   sildenafil (REVATIO) 20 MG tablet   Constipation    Suspected functional constipation, likely med side effects on Myrbetriq - Recommend improve lifestyle, hydration, fiber - Can try OTC Docusate stool softener nightly 100mg , then if prefer can advance to Miralax OTC half to 1 cap powder full glass water, daily or maintenance or PRN adjustment      BPH without urinary obstruction    Stable, without complication Followed by Urology ([facility]) - Last DRE mild +2 (2017), PSA 2.3 (2015) Off Flomax now, without problems Seems LUTS were mostly due to OAB, now improved on Myrbetriq      Bilateral lower extremity edema    Stable chronic problem Improved with compression Follow-up labs, and discussion further at next visit, consider repeat ECHO      Atherosclerosis    No history of HLD. But with HTN, age, male risk factors, calculated ASCVD 10 yr Risk at elevated 18%. - Resume ASA 81mg  daily - Discussion on statin therapy for risk reduction, will defer for now, consider again in future      Relevant Medications   sildenafil (REVATIO) 20 MG tablet    Other Visit Diagnoses    Encounter to establish care with new doctor    -  Primary   Relevant Orders   Lipid panel   Need for pneumococcal vaccination       S/p pneumovax-23 in 2015, never had prevnar-13, now completed pneumonia vaccinations   Relevant Orders   Pneumococcal conjugate vaccine 13-valent IM (Completed)   Impaired fasting glucose       Relevant Orders   Hemoglobin A1c      Meds ordered  this encounter  Medications  . : losartan (COZAAR) 100 MG tablet    Sig: TAKE 1 TABLET (100 MG TOTAL) BY MOUTH EVERY MORNING.    Refill:  1  .           . sildenafil (REVATIO) 20 MG tablet    Sig: Take 1-5 pills about 30 min prior to sex.    Dispense:  50 tablet    Refill:  11  .                   Follow up plan: Return in about 3 months (around 10/02/2016) for Annual Physical, lab results, Neuropathy/Anxiety.  [name], [facility] 07/02/2016, 3:07 PM

## 2016-07-02 NOTE — Assessment & Plan Note (Signed)
Stable, improved control on Myrbetriq Followed by Urology ([facility])

## 2016-07-02 NOTE — Assessment & Plan Note (Signed)
No history of HLD. But with HTN, age, male risk factors, calculated ASCVD 10 yr Risk at elevated 18%. - Resume ASA 81mg  daily - Discussion on statin therapy for risk reduction, will defer for now, consider again in future

## 2016-08-24 DIAGNOSIS — S70361A Insect bite (nonvenomous), right thigh, initial encounter: Secondary | ICD-10-CM | POA: Diagnosis not present

## 2016-08-28 ENCOUNTER — Telehealth: Payer: Self-pay | Admitting: Family Medicine

## 2016-08-28 NOTE — Telephone Encounter (Signed)
Called pt to schedule Annual Wellness Visit with [place] for June :  - knb

## 2016-09-04 DIAGNOSIS — W57XXXD Bitten or stung by nonvenomous insect and other nonvenomous arthropods, subsequent encounter: Secondary | ICD-10-CM | POA: Diagnosis not present

## 2016-09-04 DIAGNOSIS — L089 Local infection of the skin and subcutaneous tissue, unspecified: Secondary | ICD-10-CM | POA: Diagnosis not present

## 2016-09-04 DIAGNOSIS — S70361D Insect bite (nonvenomous), right thigh, subsequent encounter: Secondary | ICD-10-CM | POA: Diagnosis not present

## 2016-09-16 DIAGNOSIS — M1711 Unilateral primary osteoarthritis, right knee: Secondary | ICD-10-CM | POA: Diagnosis not present

## 2016-09-16 DIAGNOSIS — M25562 Pain in left knee: Secondary | ICD-10-CM | POA: Diagnosis not present

## 2016-09-16 DIAGNOSIS — S70261D Insect bite (nonvenomous), right hip, subsequent encounter: Secondary | ICD-10-CM | POA: Diagnosis not present

## 2016-09-16 DIAGNOSIS — W57XXXD Bitten or stung by nonvenomous insect and other nonvenomous arthropods, subsequent encounter: Secondary | ICD-10-CM | POA: Diagnosis not present

## 2016-09-16 DIAGNOSIS — M1712 Unilateral primary osteoarthritis, left knee: Secondary | ICD-10-CM | POA: Diagnosis not present

## 2016-09-29 ENCOUNTER — Ambulatory Visit (INDEPENDENT_AMBULATORY_CARE_PROVIDER_SITE_OTHER): Payer: Medicare Other

## 2016-09-29 ENCOUNTER — Other Ambulatory Visit: Payer: Medicare Other

## 2016-09-29 VITALS — BP 132/70 | HR 78 | Temp 98.2°F | Ht 74.0 in | Wt 196.6 lb

## 2016-09-29 DIAGNOSIS — Z7689 Persons encountering health services in other specified circumstances: Secondary | ICD-10-CM

## 2016-09-29 DIAGNOSIS — I10 Essential (primary) hypertension: Secondary | ICD-10-CM | POA: Diagnosis not present

## 2016-09-29 DIAGNOSIS — F418 Other specified anxiety disorders: Secondary | ICD-10-CM | POA: Diagnosis not present

## 2016-09-29 DIAGNOSIS — R7301 Impaired fasting glucose: Secondary | ICD-10-CM

## 2016-09-29 DIAGNOSIS — Z0000 Encounter for general adult medical examination without abnormal findings: Secondary | ICD-10-CM | POA: Diagnosis not present

## 2016-09-29 DIAGNOSIS — G609 Hereditary and idiopathic neuropathy, unspecified: Secondary | ICD-10-CM

## 2016-09-29 LAB — TSH: TSH: 2.38 mIU/L (ref 0.40–4.50)

## 2016-09-29 NOTE — Patient Instructions (Signed)
James Cardenas , Thank you for taking time to come for your Medicare Wellness Visit. I appreciate your ongoing commitment to your health goals. Please review the following plan we discussed and let me know if I can assist you in the future.   Screening recommendations/referrals: Colonoscopy: Completed 1//2011 Recommended yearly ophthalmology/optometry visit for glaucoma screening and checkup Recommended yearly dental visit for hygiene and checkup  Vaccinations: Influenza vaccine: up to date, due 02/2017 Pneumococcal vaccine: up to date Tdap vaccine: up to date Shingles vaccine: up to date  Advanced directives: Please bring a copy in to the office at your convenience   Conditions/risks identified: Recommend drinking at least 4-5 glasses of water a day   Next appointment: Follow up on 10/06/2016 at [facility] with [place]. Follow up in one year for your annual wellness exam.   Preventive Care 75 Years and Older, Male Preventive care refers to lifestyle choices and visits with your health care provider that can promote health and wellness. What does preventive care include?  A yearly physical exam. This is also called an annual well check.  Dental exams once or twice a year.  Routine eye exams. Ask your health care provider how often you should have your eyes checked.  Personal lifestyle choices, including:  Daily care of your teeth and gums.  Regular physical activity.  Eating a healthy diet.  Avoiding tobacco and drug use.  Limiting alcohol use.  Practicing safe sex.  Taking low doses of aspirin every day.  Taking vitamin and mineral supplements as recommended by your health care provider. What happens during an annual well check? The services and screenings done by your health care provider during your annual well check will depend on your age, overall health, lifestyle risk factors, and family history of disease. Counseling  Your health care provider may ask you questions  about your:  Alcohol use.  Tobacco use.  Drug use.  Emotional well-being.  Home and relationship well-being.  Sexual activity.  Eating habits.  History of falls.  Memory and ability to understand (cognition).  Work and work Statistician. Screening  You may have the following tests or measurements:  Height, weight, and BMI.  Blood pressure.  Lipid and cholesterol levels. These may be checked every 5 years, or more frequently if you are over 70 years old.  Skin check.  Lung cancer screening. You may have this screening every year starting at age 30 if you have a 30-pack-year history of smoking and currently smoke or have quit within the past 15 years.  Fecal occult blood test (FOBT) of the stool. You may have this test every year starting at age 75.  Flexible sigmoidoscopy or colonoscopy. You may have a sigmoidoscopy every 5 years or a colonoscopy every 10 years starting at age 75.  Prostate cancer screening. Recommendations will vary depending on your family history and other risks.  Hepatitis C blood test.  Hepatitis B blood test.  Sexually transmitted disease (STD) testing.  Diabetes screening. This is done by checking your blood sugar (glucose) after you have not eaten for a while (fasting). You may have this done every 1-3 years.  Abdominal aortic aneurysm (AAA) screening. You may need this if you are a current or former smoker.  Osteoporosis. You may be screened starting at age 75 if you are at high risk. Talk with your health care provider about your test results, treatment options, and if necessary, the need for more tests. Vaccines  Your health care provider may recommend  certain vaccines, such as:  Influenza vaccine. This is recommended every year.  Tetanus, diphtheria, and acellular pertussis (Tdap, Td) vaccine. You may need a Td booster every 10 years.  Zoster vaccine. You may need this after age 75.  Pneumococcal 13-valent conjugate (PCV13)  vaccine. One dose is recommended after age 75.  Pneumococcal polysaccharide (PPSV23) vaccine. One dose is recommended after age 75. Talk to your health care provider about which screenings and vaccines you need and how often you need them. This information is not intended to replace advice given to you by your health care provider. Make sure you discuss any questions you have with your health care provider. Document Released: 04/26/2015 Document Revised: 12/18/2015 Document Reviewed: 01/29/2015 Elsevier Interactive Patient Education  2017 [place] Prevention in the Home Falls can cause injuries. They can happen to people of all ages. There are many things you can do to make your home safe and to help prevent falls. What can I do on the outside of my home?  Regularly fix the edges of walkways and driveways and fix any cracks.  Remove anything that might make you trip as you walk through a door, such as a raised step or threshold.  Trim any bushes or trees on the path to your home.  Use bright outdoor lighting.  Clear any walking paths of anything that might make someone trip, such as rocks or tools.  Regularly check to see if handrails are loose or broken. Make sure that both sides of any steps have handrails.  Any raised decks and porches should have guardrails on the edges.  Have any leaves, snow, or ice cleared regularly.  Use sand or salt on walking paths during winter.  Clean up any spills in your garage right away. This includes oil or grease spills. What can I do in the bathroom?  Use night lights.  Install grab bars by the toilet and in the tub and shower. Do not use towel bars as grab bars.  Use non-skid mats or decals in the tub or shower.  If you need to sit down in the shower, use a plastic, non-slip stool.  Keep the floor dry. Clean up any water that spills on the floor as soon as it happens.  Remove soap buildup in the tub or shower  regularly.  Attach bath mats securely with double-sided non-slip rug tape.  Do not have throw rugs and other things on the floor that can make you trip. What can I do in the bedroom?  Use night lights.  Make sure that you have a light by your bed that is easy to reach.  Do not use any sheets or blankets that are too big for your bed. They should not hang down onto the floor.  Have a firm chair that has side arms. You can use this for support while you get dressed.  Do not have throw rugs and other things on the floor that can make you trip. What can I do in the kitchen?  Clean up any spills right away.  Avoid walking on wet floors.  Keep items that you use a lot in easy-to-reach places.  If you need to reach something above you, use a strong step stool that has a grab bar.  Keep electrical cords out of the way.  Do not use floor polish or wax that makes floors slippery. If you must use wax, use non-skid floor wax.  Do not have throw rugs and other  things on the floor that can make you trip. What can I do with my stairs?  Do not leave any items on the stairs.  Make sure that there are handrails on both sides of the stairs and use them. Fix handrails that are broken or loose. Make sure that handrails are as long as the stairways.  Check any carpeting to make sure that it is firmly attached to the stairs. Fix any carpet that is loose or worn.  Avoid having throw rugs at the top or bottom of the stairs. If you do have throw rugs, attach them to the floor with carpet tape.  Make sure that you have a light switch at the top of the stairs and the bottom of the stairs. If you do not have them, ask someone to add them for you. What else can I do to help prevent falls?  Wear shoes that:  Do not have high heels.  Have rubber bottoms.  Are comfortable and fit you well.  Are closed at the toe. Do not wear sandals.  If you use a stepladder:  Make sure that it is fully  opened. Do not climb a closed stepladder.  Make sure that both sides of the stepladder are locked into place.  Ask someone to hold it for you, if possible.  Clearly mark and make sure that you can see:  Any grab bars or handrails.  First and last steps.  Where the edge of each step is.  Use tools that help you move around (mobility aids) if they are needed. These include:  Canes.  Walkers.  Scooters.  Crutches.  Turn on the lights when you go into a dark area. Replace any light bulbs as soon as they burn out.  Set up your furniture so you have a clear path. Avoid moving your furniture around.  If any of your floors are uneven, fix them.  If there are any pets around you, be aware of where they are.  Review your medicines with your doctor. Some medicines can make you feel dizzy. This can increase your chance of falling. Ask your doctor what other things that you can do to help prevent falls. This information is not intended to replace advice given to you by your health care provider. Make sure you discuss any questions you have with your health care provider. Document Released: 01/24/2009 Document Revised: 09/05/2015 Document Reviewed: 05/04/2014 Elsevier Interactive Patient Education  2017 [employer].

## 2016-09-29 NOTE — Progress Notes (Signed)
Subjective:   James Cardenas is a 76 y.o. male who presents for Medicare Annual/Subsequent preventive examination.  Review of Systems:   Cardiac Risk Factors include: male gender;hypertension;advanced age (>61men, >95 women)     Objective:    Vitals: BP 132/70 (BP Location: Left Arm, Patient Position: Sitting)   Pulse 78   Temp 98.2 F (36.8 C)   Ht 6\' 2"  (1.88 m)   Wt 196 lb 9.6 oz (89.2 kg)   BMI 25.24 kg/m   Body mass index is 25.24 kg/m.  Tobacco History  Smoking Status  . Never Smoker  Smokeless Tobacco  . Never Used     Counseling given: Not Answered   Past Medical History:  Diagnosis Date  . Arthritis of knee   . Hypertension   . Neuropathy    Past Surgical History:  Procedure Laterality Date  . NECK SURGERY  2011   Family History  Problem Relation Age of Onset  . Thyroid disease Mother   . Lung cancer Father   . Pancreatic cancer Brother   . Prostate cancer Neg Hx   . Bladder Cancer Neg Hx   . Kidney cancer Neg Hx    History  Sexual Activity  . Sexual activity: Yes    Outpatient Encounter Prescriptions as of 09/29/2016  Medication Sig  . ALPRAZolam (XANAX) 0.5 MG tablet TAKE 1 TABLET BY MOUTH 3 TIMES A DAY AS NEEDED FOR ANXIETY  . Cyanocobalamin (VITAMIN B 12) 100 MCG LOZG Take by mouth.  Marland Kitchen FINACEA 15 % FOAM Apply AS directed TO FACE daily  . glucosamine-chondroitin 500-400 MG tablet Take 1 tablet by mouth 3 (three) times daily.  Marland Kitchen ibuprofen (ADVIL,MOTRIN) 200 MG tablet Take 200 mg by mouth every 6 (six) hours as needed.  Marland Kitchen losartan (COZAAR) 100 MG tablet Take 1 tablet (100 mg total) by mouth daily.  . Menthol, Topical Analgesic, (BLUE-EMU MAXIMUM STRENGTH EX) Apply topically.  . mirabegron ER (MYRBETRIQ) 25 MG TB24 tablet Take 1 tablet (25 mg total) by mouth daily.  . Multiple Vitamin (MULTI-VITAMINS) TABS Take by mouth.  . Omega-3 1000 MG CAPS Take by mouth.  . sildenafil (REVATIO) 20 MG tablet Take 1-5 pills about 30 min prior to sex.    . tamsulosin (FLOMAX) 0.4 MG CAPS capsule Take 0.4 mg by mouth.  . triamcinolone cream (KENALOG) 0.1 % APPLY TO AFFECTED AREAS OF LEGS AND CHEST TWICE A DAY AS NEEDED 2WEEK ON 2 WEEKS OFF  . aspirin EC 81 MG tablet Take by mouth.   No facility-administered encounter medications on file as of 09/29/2016.     Activities of Daily Living In your present state of health, do you have any difficulty performing the following activities: 09/29/2016 07/02/2016  Hearing? N N  Vision? N N  Difficulty concentrating or making decisions? N N  Walking or climbing stairs? N Y  Dressing or bathing? N N  Doing errands, shopping? N N  Preparing Food and eating ? N -  Using the Toilet? N -  In the past six months, have you accidently leaked urine? N -  Do you have problems with loss of bowel control? N -  Managing your Medications? N -  Managing your Finances? N -  Housekeeping or managing your Housekeeping? N -  Some recent data might be hidden    Patient Care Team: Olin Hauser, DO as PCP - General (Family Medicine) Renata Caprice as Physician Assistant (Orthopedic Surgery)   Assessment:  Exercise Activities and Dietary recommendations Current Exercise Habits: Home exercise routine, Type of exercise: walking;strength training/weights, Time (Minutes): > 60, Frequency (Times/Week): 6, Weekly Exercise (Minutes/Week): 0, Intensity: Mild, Exercise limited by: None identified  Goals    . Increase water intake          Recommend drinking at least 4-5 glasses of water a day       Fall Risk Fall Risk  09/29/2016 07/02/2016  Falls in the past year? No No   Depression Screen PHQ 2/9 Scores 09/29/2016 07/02/2016  PHQ - 2 Score 0 0    Cognitive Function     6CIT Screen 09/29/2016  What Year? 0 points  What month? 0 points  What time? 0 points  Count back from 20 0 points  Months in reverse 0 points  Repeat phrase 0 points  Total Score 0    Immunization History   Administered Date(s) Administered  . Influenza-Unspecified 11/12/2015  . Pneumococcal Conjugate-13 07/02/2016  . Pneumococcal Polysaccharide-23 05/24/2008  . Tetanus 08/01/2010  . Zoster 04/13/2009   Screening Tests Health Maintenance  Topic Date Due  . INFLUENZA VACCINE  11/11/2016  . PNA vac Low Risk Adult (2 of 2 - PPSV23) 07/02/2017  . COLONOSCOPY  04/14/2019  . TETANUS/TDAP  07/23/2020      Plan:    I have personally reviewed and addressed the Medicare Annual Wellness questionnaire and have noted the following in the patient's chart:  A. Medical and social history B. Use of alcohol, tobacco or illicit drugs  C. Current medications and supplements D. Functional ability and status E.  Nutritional status F.  Physical activity G. Advance directives H. List of other physicians I.  Hospitalizations, surgeries, and ER visits in previous 12 months J.  [place] such as hearing and vision if needed, cognitive and depression L. Referrals and appointments   In addition, I have reviewed and discussed with patient certain preventive protocols, quality metrics, and best practice recommendations. A written personalized care plan for preventive services as well as general preventive health recommendations were provided to patient.   Signed,  Tyler Aas, LPN Nurse Health Advisor   MD Recommendations: none

## 2016-09-30 LAB — VITAMIN B12: Vitamin B-12: 920 pg/mL (ref 200–1100)

## 2016-09-30 LAB — LIPID PANEL
Cholesterol: 171 mg/dL (ref <200)
HDL: 73 mg/dL (ref >40)
LDL Cholesterol: 90 mg/dL (ref <100)
Total CHOL/HDL Ratio: 2.3 Ratio (ref <5.0)
Triglycerides: 40 mg/dL (ref <150)
VLDL: 8 mg/dL (ref <30)

## 2016-09-30 LAB — HEMOGLOBIN A1C
Hgb A1c MFr Bld: 5.4 % (ref <5.7)
Mean Plasma Glucose: 108 mg/dL

## 2016-10-06 ENCOUNTER — Ambulatory Visit (INDEPENDENT_AMBULATORY_CARE_PROVIDER_SITE_OTHER): Payer: Medicare Other | Admitting: Family Medicine

## 2016-10-06 ENCOUNTER — Encounter: Payer: Self-pay | Admitting: Family Medicine

## 2016-10-06 VITALS — BP 106/55 | HR 56 | Ht 74.0 in | Wt 193.2 lb

## 2016-10-06 DIAGNOSIS — R011 Cardiac murmur, unspecified: Secondary | ICD-10-CM

## 2016-10-06 DIAGNOSIS — I7090 Unspecified atherosclerosis: Secondary | ICD-10-CM | POA: Diagnosis not present

## 2016-10-06 DIAGNOSIS — G609 Hereditary and idiopathic neuropathy, unspecified: Secondary | ICD-10-CM

## 2016-10-06 DIAGNOSIS — F418 Other specified anxiety disorders: Secondary | ICD-10-CM | POA: Diagnosis not present

## 2016-10-06 DIAGNOSIS — I10 Essential (primary) hypertension: Secondary | ICD-10-CM

## 2016-10-06 MED ORDER — DULOXETINE HCL 30 MG PO CPEP: 30.0000 mg | ORAL_CAPSULE | Freq: Every day | ORAL | 5 refills | Status: DC

## 2016-10-06 MED ORDER — ALPRAZOLAM 0.5 MG PO TABS: ORAL_TABLET | ORAL | 2 refills | Status: DC

## 2016-10-06 NOTE — Assessment & Plan Note (Addendum)
Stable, chronic >10 year situational anxiety, not consistent with GAD or panic disorder. No co-morbid depression / mood disorder. -GAD7: unchanged 1, not difficult / PHQ2: 0 - Controlled on PRN Xanax 0.5, not daily dosing - Failed: Prozac (side effects) - No prior Psych / counseling - Checked [place] CSRS for past 1 year - only one refilled Xanax in 2017, previously 90 tab rx, no red flags, confirms infrequent use  Plan: 1. Reviewed again briefly discussion from last visit on chronic anxiety management, meds - focus on SSRI vs BDZ. See last A&P for details. 2. Agree to start on SNRI with Cymbalta 30mg  daily - new rx sent, counseling on potential side effects, benefits with nerve pain and anxiety management, hope to use less Xanax 3. Refilled Xanax #60 instead of 90, +2 refills, take BID PRN - advised on avoiding withdrawal and future taper if needed 4. Follow-up 3 months for Anxiety PHQ/GAD7 - consider switch to Lexapro for better anxiety control if needed

## 2016-10-06 NOTE — Assessment & Plan Note (Signed)
No hyperlipidemia on labs, controlled on lifestyle and fish oil ASCVD 10 year risk 16.2% based on age primarily, actually lower than average population at 18.6%  Plan: 1. Again advised to start ASA 81mg  daily as primary prevention 2. Agree to hold off on statin - patient concerned with rx med and side effects, consider this in future if risk increases, very low dose intermittent likely 3. Follow-up as planned

## 2016-10-06 NOTE — Patient Instructions (Addendum)
Thank you for coming to the clinic today.  1. Recommend to take the baby Aspirin 81mg  daily for reduce risk of future heart attack and stroke, cardiovascular risk, since we are not deciding to start the Statin cholesterol medicine at this time.  2. For anxiety and for neuropathy  Start treatment with Duloxetine (Cymbalta), take 30mg  capsule daily for next 4-6 weeks. As discussed most anxiety medications are also used for mood disorders such as depression, because they work on similar chemicals in your brain. It may take up to 3-4 weeks for the medicine to take full effect and for you to notice a difference, sometimes you may notice it working sooner, otherwise we may need to adjust the dose.  If after 4 weeks, not significantly improved anxiety control or if neuropathy not fully improved, we can increase to max dose of Cymbalta 60mg .  If need to stop, and only on this for 1-2 weeks, you can STOP this immediately.  If you are on it for >4 weeks then take it [place]  I have refilled the Xanax prescription for now, TWICE daily 60 pills,+2 refills for 3 month supply - this should be effective for you for a long time for now.  You can try the NerveFix if needed, it should not cause a problem. But prefer to start with new medication first. You may try ONLY Alpha Lipoic Acid if you prefer to take this supplement for nerve.  Please schedule a Follow-up Appointment to: Return in about 3 months (around 01/06/2017) for Anxiety PHQ/GAD7, Neuropathy.  If you have any other questions or concerns, please feel free to call the clinic or send a message through [place]. You may also schedule an earlier appointment if necessary.  Additionally, you may be receiving a survey about your experience at our clinic within a few days to 1 week by e-mail or mail. We value your feedback.  [name], DO [place]

## 2016-10-06 NOTE — Assessment & Plan Note (Signed)
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 Start Cymbalta 30mg  daily - consider titrate up to 60mg  daily 4-6 weeks or wait 3 months 2. Follow-up 3 months - consider other options as well such as gabapentin, TCA low dose, muscle relaxant - however patient less inclined to take multiple rx medications, he prefers supplement. May consider ginseng, ginger, however he is already on supplements for nerve health. May add separate Alpha Lipoic Acid instead of NerveFix if needed. Lastly further work-up with referral to Neurology, N[facility] if needed

## 2016-10-06 NOTE — Assessment & Plan Note (Signed)
Improved control No known complications  Plan: 1. Continue Losartan 100mg  daily 2. Encouraged healthy lifestyle - resume exercise 3. Follow-up 3 months

## 2016-10-06 NOTE — Assessment & Plan Note (Signed)
Stable chronic problem, still with murmur Last ECHO 2015 (LVEF 55%, mild MR, Moderate AS and AR)  Will follow-up in future, consider repeat ECHO within 1 year, or q 3 years as discussed, sooner if symptoms

## 2016-10-06 NOTE — Progress Notes (Signed)
Subjective:    Patient ID: James Cardenas, male    DOB: [birth year], 76 y.o.   MRN: [id number]  James Cardenas is a 76 y.o. male presenting on 10/06/2016 for Annual Exam  HPI   Specialist: Urology - Dr Pilar Jarvis ([facility]) Orthopedics - Dorise Hiss PA, [facility]  CHRONIC HTN: Improved control BP at home, monitoring BP Meds - Losartan 100mg  daily Reports good compliance, took meds today. Tolerating well, w/o complaints.  PMH - OAB / Mild BPH without obstruction/LUTS / Erectile Dysfunction. OA/DJD  H/o Heart Murmur, congenital - Reports a chronic history of heart murmur, initially when young very faint 1/6 reported, then recently about 2 years ago stated was heard to be louder up to 2/6, and has had prior ECHO last in 2015 (results in [place] with LVEF 55%, mild MR, Moderate AS and AR, he was advised to repeat ECHO in 2017. - Denies any new symptoms from murmur today  Atherosclerotic Cardiovascular Risk / No history of Hyperlipidemia: - Today again he admits that he is not interested in statin. He continues to take Omega 3 Fish Oil supplement - He has resumed Aspirin 81mg  daily - Recent cholesterol results 09/2016, normal range - No significant history of MI, CVA, HLD  Chronic Anxiety, Situational: - Last visit with me 07/02/16, for same problem to establish care, he was not refilled on Xanax that time, had enough to last, recommended SSRI therapy, he was given recommendations, see prior notes for background information. - Today patient reports he still is interested to continue Xanax, but open to trying new med as well for stabilizing anxiety, as he only takes Xanax PRN still, often 90 pills may last several months, rarely takes twice daily. Denies any symptoms while on medicine, no dizziness, grogginess. Denies any withdrawal symptoms if does not take benzo for weeks. - Failed prozac in past - Denies any depression or sad mood  Peripheral Neuropathy / Leg Swelling - Last visit with  me 07/02/16, for same problem when established care, had initial labs for diagnostics, see prior notes for background information. - Interval update with lab results Vitamin B12, TSH, A1c all in normal range, unlikely etiology for neuropathy - Today patient reports concern that this may be "idiopathic" and unable to identify cause from his prior research and discussions with other doctors, he has never seen Neurology before. He has tried taking other supplement OTC NerveFix includes Alpha Lipoic Acid - Still taking Vitamin B12 1000mg  daily - Never on gabapentin - Admits exercise improves both neuropathy and swelling, states that now both worse since slowed down exercise, tennis, swimming  Health Maintenance Colon CA Screening: last colonoscopy 2011 - reported normal, no polyps, advised 10 yr next, interested in cologuard in future Prostate CA Screening: Followed by Urology   Depression screen [facility] 2/9 10/06/2016 09/29/2016 07/02/2016  Decreased Interest 0 0 0  Down, Depressed, Hopeless 0 0 0  PHQ - 2 Score 0 0 0   GAD 7 : Generalized Anxiety Score 10/06/2016 07/02/2016  Nervous, Anxious, on Edge 1 1  Control/stop worrying 0 0  Worry too much - different things 0 0  Trouble relaxing 0 0  Restless 0 0  Easily annoyed or irritable 0 0  Afraid - awful might happen 0 0  Total GAD 7 Score 1 1  Anxiety Difficulty Not difficult at all Not difficult at all     Past Medical History:  Diagnosis Date  . Arthritis of knee   . Hypertension   .  Neuropathy    Past Surgical History:  Procedure Laterality Date  . NECK SURGERY  2011   Social History   Social History  . Marital status: Divorced    Spouse name: N/A  . Number of children: N/A  . Years of education: N/A   Occupational History  . Retired Designer, multimedia, Public affairs consultant / heat treating)    Social History Main Topics  . Smoking status: Never Smoker  . Smokeless tobacco: Never Used  . Alcohol use 4.8 oz/week    7 Glasses of wine, 1  Cans of beer per week  . Drug use: No  . Sexual activity: Yes   Other Topics Concern  . Not on file   Social History Narrative  . No narrative on file   Family History  Problem Relation Age of Onset  . Thyroid disease Mother   . Lung cancer Father   . Pancreatic cancer Brother   . Prostate cancer Neg Hx   . Bladder Cancer Neg Hx   . Kidney cancer Neg Hx    Current Outpatient Prescriptions on File Prior to Visit  Medication Sig  . aspirin EC 81 MG tablet Take by mouth.  . Cyanocobalamin (VITAMIN B 12) 100 MCG LOZG Take by mouth.  Marland Kitchen FINACEA 15 % FOAM Apply AS directed TO FACE daily  . glucosamine-chondroitin 500-400 MG tablet Take 1 tablet by mouth 3 (three) times daily.  Marland Kitchen ibuprofen (ADVIL,MOTRIN) 200 MG tablet Take 200 mg by mouth every 6 (six) hours as needed.  Marland Kitchen losartan (COZAAR) 100 MG tablet Take 1 tablet (100 mg total) by mouth daily.  . Menthol, Topical Analgesic, (BLUE-EMU MAXIMUM STRENGTH EX) Apply topically.  . mirabegron ER (MYRBETRIQ) 25 MG TB24 tablet Take 1 tablet (25 mg total) by mouth daily.  . Multiple Vitamin (MULTI-VITAMINS) TABS Take by mouth.  . Omega-3 1000 MG CAPS Take by mouth.  . sildenafil (REVATIO) 20 MG tablet Take 1-5 pills about 30 min prior to sex.  . triamcinolone cream (KENALOG) 0.1 % APPLY TO AFFECTED AREAS OF LEGS AND CHEST TWICE A DAY AS NEEDED 2WEEK ON 2 WEEKS OFF   No current facility-administered medications on file prior to visit.     Review of Systems  Constitutional: Negative for activity change, appetite change, chills, diaphoresis, fatigue, fever and unexpected weight change.  HENT: Negative for congestion, hearing loss and sinus pressure.   Eyes: Negative for visual disturbance.  Respiratory: Negative for cough, chest tightness, shortness of breath and wheezing.   Cardiovascular: Negative for chest pain, palpitations and leg swelling.  Gastrointestinal: Negative for abdominal pain, constipation, diarrhea, nausea and vomiting.    Endocrine: Negative for cold intolerance and polyuria.  Genitourinary: Positive for urgency (improved on medicine). Negative for decreased urine volume, difficulty urinating, dysuria, frequency, hematuria and testicular pain.  Musculoskeletal: Negative for arthralgias, back pain and neck pain.  Skin: Negative for rash.  Allergic/Immunologic: Negative for environmental allergies.  Neurological: Negative for dizziness, weakness, light-headedness, numbness and headaches.  Hematological: Negative for adenopathy.  Psychiatric/Behavioral: Negative for agitation, behavioral problems, decreased concentration, dysphoric mood, self-injury, sleep disturbance and suicidal ideas. The patient is nervous/anxious (mild, with occasional flares based on situations).    Per HPI unless specifically indicated above     Objective:    BP (!) 106/55 (BP Location: Left Arm, Patient Position: Sitting, Cuff Size: Normal)   Pulse (!) 56   Ht 6\' 2"  (1.88 m)   Wt 193 lb 3.2 oz (87.6 kg)   BMI 24.81 kg/m  Wt Readings from Last 3 Encounters:  10/06/16 193 lb 3.2 oz (87.6 kg)  09/29/16 196 lb 9.6 oz (89.2 kg)  07/02/16 202 lb (91.6 kg)    Physical Exam  Constitutional: He is oriented to person, place, and time. He appears well-developed and well-nourished. No distress.  Well-appearing, comfortable, cooperative  HENT:  Head: Normocephalic and atraumatic.  Mouth/Throat: Oropharynx is clear and moist.  Frontal / maxillary sinuses non-tender. Nares patent without purulence or edema. Bilateral TMs clear without erythema, effusion or bulging. Oropharynx clear without erythema, exudates, edema or asymmetry.  Eyes: Conjunctivae are normal. Right eye exhibits no discharge. Left eye exhibits no discharge.  Neck: Normal range of motion. Neck supple. No thyromegaly present.  Carotid bruit with radiation from heart murmur bilateral  Cardiovascular: Normal rate, regular rhythm and intact distal pulses.   Murmur (2/6  systolic murmur loudest at left sternal border with radiation to carotids) heard. Pulmonary/Chest: Effort normal and breath sounds normal. No respiratory distress. He has no wheezes. He has no rales.  Musculoskeletal: Normal range of motion. He exhibits no edema.  Lymphadenopathy:    He has no cervical adenopathy.  Neurological: He is alert and oriented to person, place, and time.  Skin: Skin is warm and dry. No rash noted. He is not diaphoretic. No erythema.  Psychiatric: He has a normal mood and affect. His behavior is normal.  Well groomed, good eye contact, normal speech and thoughts. Mildly anxious appearing at times but overall seems comfortable, seems to have higher energy. Good mood.  Nursing note and vitals reviewed.   Recent Results (from the past 2160 hour(s))  Lipid panel     Status: None   Collection Time: 09/29/16  8:48 AM  Result Value Ref Range   Cholesterol 171 <200 mg/dL   Triglycerides 40 <150 mg/dL   HDL 73 >40 mg/dL   Total CHOL/HDL Ratio 2.3 <5.0 Ratio   VLDL 8 <30 mg/dL   LDL Cholesterol 90 <100 mg/dL  Vitamin B12     Status: None   Collection Time: 09/29/16  8:48 AM  Result Value Ref Range   Vitamin B-12 920 200 - 1,100 pg/mL  TSH     Status: None   Collection Time: 09/29/16  8:48 AM  Result Value Ref Range   TSH 2.38 0.40 - 4.50 mIU/L  Hemoglobin A1c     Status: None   Collection Time: 09/29/16  8:48 AM  Result Value Ref Range   Hgb A1c MFr Bld 5.4 <5.7 %    Comment:   For the purpose of screening for the presence of diabetes:   <5.7%       Consistent with the absence of diabetes 5.7-6.4 %   Consistent with increased risk for diabetes (prediabetes) >=6.5 %     Consistent with diabetes   This assay result is consistent with a decreased risk of diabetes.   Currently, no consensus exists regarding use of hemoglobin A1c for diagnosis of diabetes in children.   According to American Diabetes Association (ADA) guidelines, hemoglobin A1c <7.0%  represents optimal control in non-pregnant diabetic patients. Different metrics may apply to specific patient populations. Standards of Medical Care in Diabetes (ADA).      Mean Plasma Glucose 108 mg/dL        Assessment & Plan:   Problem List Items Addressed This Visit    Situational anxiety    Stable, chronic >10 year situational anxiety, not consistent with GAD or panic disorder. No co-morbid depression / mood  disorder. -GAD7: unchanged 1, not difficult / PHQ2: 0 - Controlled on PRN Xanax 0.5, not daily dosing - Failed: Prozac (side effects) - No prior Psych / counseling - Checked [place] CSRS for past 1 year - only one refilled Xanax in 2017, previously 90 tab rx, no red flags, confirms infrequent use  Plan: 1. Reviewed again briefly discussion from last visit on chronic anxiety management, meds - focus on SSRI vs BDZ. See last A&P for details. 2. Agree to start on SNRI with Cymbalta 30mg  daily - new rx sent, counseling on potential side effects, benefits with nerve pain and anxiety management, hope to use less Xanax 3. Refilled Xanax #60 instead of 90, +2 refills, take BID PRN - advised on avoiding withdrawal and future taper if needed 4. Follow-up 3 months for Anxiety PHQ/GAD7 - consider switch to Lexapro for better anxiety control if needed      Relevant Medications   DULoxetine (CYMBALTA) 30 MG capsule   ALPRAZolam (XANAX) 0.5 MG tablet   Peripheral neuropathy - Primary    Still remains unclear exact etiology, suspected idiopathic, bilateral stocking glove pattern - Labs unremarkable B12, A1c, TSH, chemistry  Plan: 1. Start Cymbalta 30mg  daily - consider titrate up to 60mg  daily 4-6 weeks or wait 3 months 2. Follow-up 3 months - consider other options as well such as gabapentin, TCA low dose, muscle relaxant - however patient less inclined to take multiple rx medications, he prefers supplement. May consider ginseng, ginger, however he is already on supplements for nerve  health. May add separate Alpha Lipoic Acid instead of NerveFix if needed. Lastly further work-up with referral to Neurology, NCS if needed      Relevant Medications   DULoxetine (CYMBALTA) 30 MG capsule   ALPRAZolam (XANAX) 0.5 MG tablet   Murmur, cardiac    Stable chronic problem, still with murmur Last ECHO 2015 (LVEF 55%, mild MR, Moderate AS and AR)  Will follow-up in future, consider repeat ECHO within 1 year, or q 3 years as discussed, sooner if symptoms      Hypertension    Improved control No known complications  Plan: 1. Continue Losartan 100mg  daily 2. Encouraged healthy lifestyle - resume exercise 3. Follow-up 3 months      Atherosclerosis    No hyperlipidemia on labs, controlled on lifestyle and fish oil ASCVD 10 year risk 16.2% based on age primarily, actually lower than average population at 18.6%  Plan: 1. Again advised to start ASA 81mg  daily as primary prevention 2. Agree to hold off on statin - patient concerned with rx med and side effects, consider this in future if risk increases, very low dose intermittent likely 3. Follow-up as planned         Meds ordered this encounter  Medications  .           .           .           . docusate sodium (COLACE) 100 MG capsule    Sig: Take 100 mg by mouth daily as needed for mild constipation.  . Polyethylene Glycol 3350 (MIRALAX PO)    Sig: Take by mouth as needed.  . DULoxetine (CYMBALTA) 30 MG capsule    Sig: Take 1 capsule (30 mg total) by mouth daily.    Dispense:  30 capsule    Refill:  5  . ALPRAZolam (XANAX) 0.5 MG tablet    Sig: TAKE 1 TABLET BY MOUTH 2 TIMES A  DAY AS NEEDED FOR ANXIETY    Dispense:  60 tablet    Refill:  2    Follow up plan: Return in about 3 months (around 01/06/2017) for Anxiety PHQ/GAD7, Neuropathy.  [name], [facility] 10/06/2016, 11:46 PM

## 2016-11-09 ENCOUNTER — Ambulatory Visit (INDEPENDENT_AMBULATORY_CARE_PROVIDER_SITE_OTHER): Payer: Medicare Other | Admitting: Family Medicine

## 2016-11-09 ENCOUNTER — Encounter: Payer: Self-pay | Admitting: Family Medicine

## 2016-11-09 VITALS — BP 119/54 | HR 68 | Temp 98.6°F | Resp 16 | Ht 74.0 in | Wt 188.0 lb

## 2016-11-09 DIAGNOSIS — G609 Hereditary and idiopathic neuropathy, unspecified: Secondary | ICD-10-CM

## 2016-11-09 DIAGNOSIS — F418 Other specified anxiety disorders: Secondary | ICD-10-CM | POA: Diagnosis not present

## 2016-11-09 MED ORDER — ESCITALOPRAM OXALATE 10 MG PO TABS: 10.0000 mg | ORAL_TABLET | Freq: Every day | ORAL | 3 refills | Status: DC

## 2016-11-09 NOTE — Assessment & Plan Note (Signed)
Suspected idiopathic, bilateral stocking glove pattern - Labs unremarkable B12, A1c, TSH, chemistry - Failed SNRI Duloxetine (side effects)  Plan: 1. Taper off Duloxetine now - then switch to Lexapro for anxiety less likely to help neuropathy 2. Consider other options such as gabapentin, TCA low dose, muscle relaxant - however patient less inclined to take multiple rx medications, he prefers supplement. May consider ginseng, ginger, however he is already on supplements for nerve health. May try his NerveFix med OTC vs add Alpha Lipoic Acid if needed 3. Follow-up 6 weeks - will offer referral to Neurology, NCS if needed

## 2016-11-09 NOTE — Assessment & Plan Note (Addendum)
Stable without worsening chronic >10 year situational anxiety, not consistent with GAD or panic disorder. No co-morbid depression / mood disorder. -GAD7: unchanged - Failed: Prozac and Duloxetine (side effects) - No prior Psych / counseling  Plan: 1. Taper off Duloxetine qod for 1 week then off for 1-2 weeks then start new SSRI Lexapro 10mg  -  Reviewed benefits/risk/potential side effects,  2. May continue Alprazolam PRN - current rx from last visit should last >6 to 12 months - advised not permanent solution, reviewed counseling on withdrawal and risk symptoms 3. Follow-up 6 weeks for Anxiety PHQ/GAD7 - if fails another SSRI agent, may consider monitoring for period of time, may offer [facility] Controlled Substance Contract for monitoring Alprazolam in interval

## 2016-11-09 NOTE — Patient Instructions (Addendum)
Thank you for coming to the clinic today.   1. Taper down off Duloxetine 30mg  - take one every OTHER day for 1 week, then stop completely.  2. Wait 2 weeks then start new medicine Escitalopram (Lexapro) 10mg  - take once daily in morning with food everyday, and it may take up to 4-6 weeks for full effect, goal to stabilize anxiety, and reduce minor anxieties. This one has limited side effects and should be well tolerated. - If you do get side effects by about 1-2 weeks on med recommend to reduce dose by HALF - cut tabs in HALF for once daily 5mg  for about 4 more weeks.  Can continue to take Alprazolam as needed for now  May go ahead and try the NerveFix medicine and let me know how this goes, if not improving we will likely consider referral to Neurology for nerve conduction testing.  Please schedule a Follow-up Appointment to: Return in about 6 weeks (around 12/21/2016) for Anxiety GAD7, peripheral neuropathy.  If you have any other questions or concerns, please feel free to call the clinic or send a message through [place]. You may also schedule an earlier appointment if necessary.  Additionally, you may be receiving a survey about your experience at our clinic within a few days to 1 week by e-mail or mail. We value your feedback.  [name], DO [place]

## 2016-11-09 NOTE — Progress Notes (Signed)
Subjective:    Patient ID: James Cardenas, male    DOB: 01/29/1941, 76 y.o.   MRN: [id number]  James Cardenas is a 76 y.o. male presenting on 11/09/2016 for Medication Problem (pt was Rx Duloxetine gets light HA, anxiety not improved making  him lethargic)   HPI  FOLLOW-UP Chronic Anxiety, Situational: - Last visit with me 10/06/16, for same problem, he was started on SNRI Duloxetine 30mg  daily with plan to control anxiety and also help peripheral neuropathy, also refilled on Xanax for rare PRN use, see prior notes for background information. - Interval update he tried Duloxetine but had difficulty tolerating it, he has continued to take it for past 4 weeks, but experiencing mild dizzy spells intermittently, mild headaches, described tired and "lethargic" in his words, felt like it did not help any of his minor anxieties at all, and he was concerned it would not help against any major anxiety. He did note some marginal improvement to neuropathy see below. - He also reviews prior SSRI treatment with Fluoxetine 20mg  in past per other PCP in [place] before transferring care here, however those symptoms were more severe with dizziness feeling like he was "spinning" and anxiety was worse, causing panic - Today he is asking about DC Duloxetine due to side effects, and asking proper way to do this - He has not taken any Alprazolam since last visit - He states no significant major stressor since last visit. He reiterates that he only needs Alprazolam if significant anxiety with major events such as divorce, air travel, expected events with large crowds or if panic attack - Denies any depression or sad mood  Peripheral Neuropathy - Last visit with me 10/06/16, for same problem, had initial labs for diagnostics negative, unclear etiology, see prior notes for background information. - He tried Duloxetine with only marginal improvement of neuropathy, otherwise still persistent and chronic neuropathy  without change. He has not tried the OTC NerveFix supplement yet, waiting to ask today - Still taking Vitamin B12 1000mg  daily - Never on gabapentin - Admits exercise improves both neuropathy and swelling - Admits some intermittent episodes of pain and numbness with neuropathy - Denies injury,   GAD 7 : Generalized Anxiety Score 10/06/2016 07/02/2016  Nervous, Anxious, on Edge 1 1  Control/stop worrying 0 0  Worry too much - different things 0 0  Trouble relaxing 0 0  Restless 0 0  Easily annoyed or irritable 0 0  Afraid - awful might happen 0 0  Total GAD 7 Score 1 1  Anxiety Difficulty Not difficult at all Not difficult at all     Social History  Substance Use Topics  . Smoking status: Never Smoker  . Smokeless tobacco: Never Used  . Alcohol use 4.8 oz/week    7 Glasses of wine, 1 Cans of beer per week    Review of Systems Per HPI unless specifically indicated above     Objective:    BP (!) 119/54   Pulse 68   Temp 98.6 F (37 C) (Oral)   Resp 16   Ht 6\' 2"  (1.88 m)   Wt 188 lb (85.3 kg)   BMI 24.14 kg/m   Wt Readings from Last 3 Encounters:  11/09/16 188 lb (85.3 kg)  10/06/16 193 lb 3.2 oz (87.6 kg)  09/29/16 196 lb 9.6 oz (89.2 kg)    Physical Exam  Constitutional: He is oriented to person, place, and time. He appears well-developed and well-nourished. No distress.  Well-appearing, comfortable, cooperative  HENT:  Head: Normocephalic and atraumatic.  Mouth/Throat: Oropharynx is clear and moist.  Eyes: Conjunctivae are normal. Right eye exhibits no discharge. Left eye exhibits no discharge.  Cardiovascular: Normal rate, regular rhythm and intact distal pulses.   Pulmonary/Chest: Effort normal and breath sounds normal. No respiratory distress. He has no wheezes. He has no rales.  Musculoskeletal: Normal range of motion. He exhibits no edema.  Neurological: He is alert and oriented to person, place, and time.  Skin: Skin is warm and dry. No rash noted. He is  not diaphoretic. No erythema.  Psychiatric: He has a normal mood and affect. His behavior is normal.  Well groomed, good eye contact, normal speech and thoughts. Mildly anxious appearing at times but seems less anxious today, but overall seems comfortable, seems to have stable high energy similar to previous apt. Good mood.  Nursing note and vitals reviewed.  Results for orders placed or performed in visit on 09/29/16  Lipid panel  Result Value Ref Range   Cholesterol 171 <200 mg/dL   Triglycerides 40 <150 mg/dL   HDL 73 >40 mg/dL   Total CHOL/HDL Ratio 2.3 <5.0 Ratio   VLDL 8 <30 mg/dL   LDL Cholesterol 90 <100 mg/dL  Vitamin B12  Result Value Ref Range   Vitamin B-12 920 200 - 1,100 pg/mL  TSH  Result Value Ref Range   TSH 2.38 0.40 - 4.50 mIU/L  Hemoglobin A1c  Result Value Ref Range   Hgb A1c MFr Bld 5.4 <5.7 %   Mean Plasma Glucose 108 mg/dL      Assessment & Plan:   Problem List Items Addressed This Visit    Situational anxiety - Primary    Stable without worsening chronic >10 year situational anxiety, not consistent with GAD or panic disorder. No co-morbid depression / mood disorder. -GAD7: unchanged - Failed: Prozac and Duloxetine (side effects) - No prior Psych / counseling  Plan: 1. Taper off Duloxetine qod for 1 week then off for 1-2 weeks then start new SSRI Lexapro 10mg  -  Reviewed benefits/risk/potential side effects,  2. May continue Alprazolam PRN - current rx from last visit should last >6 to 12 months - advised not permanent solution, reviewed counseling on withdrawal and risk symptoms 3. Follow-up 6 weeks for Anxiety PHQ/GAD7 - if fails another SSRI agent, may consider monitoring for period of time, may offer [facility] [employer] Substance Contract for monitoring Alprazolam in interval      Relevant Medications   escitalopram (LEXAPRO) 10 MG tablet   Peripheral neuropathy    Suspected idiopathic, bilateral stocking glove pattern - Labs unremarkable B12, A1c,  TSH, chemistry - Failed SNRI Duloxetine (side effects)  Plan: 1. Taper off Duloxetine now - then switch to Lexapro for anxiety less likely to help neuropathy 2. Consider other options such as gabapentin, TCA low dose, muscle relaxant - however patient less inclined to take multiple rx medications, he prefers supplement. May consider ginseng, ginger, however he is already on supplements for nerve health. May try his NerveFix med OTC vs add Alpha Lipoic Acid if needed 3. Follow-up 6 weeks - will offer referral to Neurology, NCS if needed      Relevant Medications   escitalopram (LEXAPRO) 10 MG tablet      Meds ordered this encounter  Medications  . escitalopram (LEXAPRO) 10 MG tablet    Sig: Take 1 tablet (10 mg total) by mouth daily.    Dispense:  30 tablet    Refill:  3      Follow up plan: Return in about 6 weeks (around 12/21/2016) for Anxiety GAD7, peripheral neuropathy.  [name], [facility] 11/09/2016, 10:22 PM

## 2017-02-12 DIAGNOSIS — Z23 Encounter for immunization: Secondary | ICD-10-CM | POA: Diagnosis not present

## 2017-03-15 DIAGNOSIS — B001 Herpesviral vesicular dermatitis: Secondary | ICD-10-CM | POA: Diagnosis not present

## 2017-03-15 DIAGNOSIS — L300 Nummular dermatitis: Secondary | ICD-10-CM | POA: Diagnosis not present

## 2017-03-15 DIAGNOSIS — L718 Other rosacea: Secondary | ICD-10-CM | POA: Diagnosis not present

## 2017-03-15 DIAGNOSIS — D0439 Carcinoma in situ of skin of other parts of face: Secondary | ICD-10-CM | POA: Diagnosis not present

## 2017-03-15 DIAGNOSIS — X32XXXA Exposure to sunlight, initial encounter: Secondary | ICD-10-CM | POA: Diagnosis not present

## 2017-03-15 DIAGNOSIS — D225 Melanocytic nevi of trunk: Secondary | ICD-10-CM | POA: Diagnosis not present

## 2017-03-15 DIAGNOSIS — D485 Neoplasm of uncertain behavior of skin: Secondary | ICD-10-CM | POA: Diagnosis not present

## 2017-03-15 DIAGNOSIS — L570 Actinic keratosis: Secondary | ICD-10-CM | POA: Diagnosis not present

## 2017-04-14 ENCOUNTER — Telehealth: Payer: Self-pay | Admitting: Urology

## 2017-04-14 DIAGNOSIS — L03032 Cellulitis of left toe: Secondary | ICD-10-CM | POA: Diagnosis not present

## 2017-04-14 NOTE — Telephone Encounter (Signed)
Pt called about a refill of Myrbetriq 25 mg, that he would like to be raised to 50 mg, if possible.  He will run out before his next appt the end of February.  Please call pt at [phone]

## 2017-04-15 MED ORDER — MIRABEGRON ER 50 MG PO TB24: 50.0000 mg | ORAL_TABLET | Freq: Every day | ORAL | 1 refills | Status: DC

## 2017-04-15 NOTE — Telephone Encounter (Signed)
Per Dr. Pilar Jarvis pt can have myrbetriq 50mg . Rx sent to mail order pharmacy.

## 2017-04-16 ENCOUNTER — Ambulatory Visit (INDEPENDENT_AMBULATORY_CARE_PROVIDER_SITE_OTHER): Payer: Medicare Other | Admitting: Family Medicine

## 2017-04-16 ENCOUNTER — Encounter: Payer: Self-pay | Admitting: Family Medicine

## 2017-04-16 VITALS — BP 132/68 | HR 67 | Temp 98.2°F | Resp 16 | Ht 74.0 in | Wt 194.0 lb

## 2017-04-16 DIAGNOSIS — F418 Other specified anxiety disorders: Secondary | ICD-10-CM | POA: Diagnosis not present

## 2017-04-16 DIAGNOSIS — G609 Hereditary and idiopathic neuropathy, unspecified: Secondary | ICD-10-CM

## 2017-04-16 MED ORDER — BUSPIRONE HCL 5 MG PO TABS: 5.0000 mg | ORAL_TABLET | Freq: Two times a day (BID) | ORAL | 2 refills | Status: DC

## 2017-04-16 NOTE — Progress Notes (Signed)
Subjective:    Patient ID: James Cardenas, male    DOB: [birth year], 77 y.o.   MRN: [id number]  James Cardenas is a 77 y.o. male presenting on 04/16/2017 for No chief complaint on file.   HPI   FOLLOW-UP Chronic Anxiety, Situational: - Last visit with me 11/09/16, for same problem, he was switched off SNRI Duloxetine 30mg  daily to Lexapro 10mg  instead with plan to control anxiety continued on rare Xanax PRN only situational, see prior notes for background information. - Interval update he has stopped Lexapro as well, failed both Duloxetine and Lexapro describes constellation of side effects dizzy, headache and feeling "lethargic", lexapro was not as severe as Duloxetine, in past failed Fluoxetine with worst symptoms - Today he is asking about long-term Xanax for PRN use only, see below says he has 0 symptoms of daily anxiety/depression, only situational rare occurrences  - He states no significant major stressor since last visit. He reiterates that he only needs Alprazolam if significant anxiety with major events such as divorce, air travel, expected events with large crowds or if panic attack - Denies any depression or sad mood  Peripheral Neuropathy, Chronic - Last visit with me 09/2016 and 10/2016 for same topic discussed at those visits, work up with lab tests showed unremarkable normal results for B12, TSH, A1c and chemistry, he has declined rx management and preferred to use OTC herbal / supplemental treatment instead, see prior notes for background information. - Interval update with persistent problem for years - Today patient reports he is interested in further testing and rx treatment, he has tried OTC CBD Oil instead of the NerveFix supplement, seems to help some with pain but does not resolve the numbness and tingling - Describes Bottoms of feet feel "half asleep numb", sometimes involving hands, mostly lower extremity, also has episodic muscle cramps in lower legs, used to be more  frequent now only few times or less most days - Taking OTC B12 supplement, asking if he should stop since lab was normal - Never on gabapentin - Admits exercise improves both neuropathy and swelling - Admits some intermittent episodes of pain and numbness with neuropathy - Denies injury, trauma redness rash  OSTEOARTHRITIS / DJD, Right Knee, Hip, other joints - Reports no change since last visit. Still takes Ibuprofen PRN, uses Blue emu and Glucosamine Chondroitin, see last note for background details  Additional complaint: Constipation - he takes regular colace most days limited relief, tries miralax has to take up to 2 capfuls to get good results, clean out, then will stop taking and uses intermittently  Depression screen [facility] 2/9 04/16/2017 10/06/2016 09/29/2016  Decreased Interest 0 0 0  Down, Depressed, Hopeless 0 0 0  PHQ - 2 Score 0 0 0  Altered sleeping 0 - -  Tired, decreased energy 0 - -  Change in appetite 0 - -  Feeling bad or failure about yourself  0 - -  Trouble concentrating 0 - -  Moving slowly or fidgety/restless 0 - -  Suicidal thoughts 0 - -  PHQ-9 Score 0 - -  Difficult doing work/chores Not difficult at all - -   GAD 7 : Generalized Anxiety Score 04/16/2017 10/06/2016 07/02/2016  Nervous, Anxious, on Edge 0 1 1  Control/stop worrying 0 0 0  Worry too much - different things 0 0 0  Trouble relaxing 0 0 0  Restless 0 0 0  Easily annoyed or irritable - 0 0  Afraid - awful might  happen 0 0 0  Total GAD 7 Score - 1 1  Anxiety Difficulty Not difficult at all Not difficult at all Not difficult at all    Social History   Tobacco Use  . Smoking status: Never Smoker  . Smokeless tobacco: Never Used  Substance Use Topics  . Alcohol use: Yes    Alcohol/week: 4.8 oz    Types: 7 Glasses of wine, 1 Cans of beer per week  . Drug use: No    Review of Systems Per HPI unless specifically indicated above     Objective:    BP 132/68   Pulse 67   Temp 98.2 F (36.8  C) (Oral)   Resp 16   Ht 6\' 2"  (1.88 m)   Wt 194 lb (88 kg)   BMI 24.91 kg/m   Wt Readings from Last 3 Encounters:  04/16/17 194 lb (88 kg)  11/09/16 188 lb (85.3 kg)  10/06/16 193 lb 3.2 oz (87.6 kg)    Physical Exam  Constitutional: He is oriented to person, place, and time. He appears well-developed and well-nourished. No distress.  Well-appearing, comfortable, cooperative  HENT:  Head: Normocephalic and atraumatic.  Mouth/Throat: Oropharynx is clear and moist.  Eyes: Conjunctivae are normal. Right eye exhibits no discharge. Left eye exhibits no discharge.  Cardiovascular: Normal rate, regular rhythm and intact distal pulses.  Pulmonary/Chest: Effort normal and breath sounds normal. No respiratory distress. He has no wheezes. He has no rales.  Musculoskeletal: Normal range of motion. He exhibits no edema.  Neurological: He is alert and oriented to person, place, and time.  Skin: Skin is warm and dry. No rash noted. He is not diaphoretic. No erythema.  Psychiatric: He has a normal mood and affect. His behavior is normal.  Well groomed, good eye contact, normal speech and thoughts. Mildly anxious appearing at times but seems stable, stable high energy similar to previous apt. Good mood.  Nursing note and vitals reviewed.  Results for orders placed or performed in visit on 09/29/16  Lipid panel  Result Value Ref Range   Cholesterol 171 <200 mg/dL   Triglycerides 40 <150 mg/dL   HDL 73 >40 mg/dL   Total CHOL/HDL Ratio 2.3 <5.0 Ratio   VLDL 8 <30 mg/dL   LDL Cholesterol 90 <100 mg/dL  Vitamin B12  Result Value Ref Range   Vitamin B-12 920 200 - 1,100 pg/mL  TSH  Result Value Ref Range   TSH 2.38 0.40 - 4.50 mIU/L  Hemoglobin A1c  Result Value Ref Range   Hgb A1c MFr Bld 5.4 <5.7 %   Mean Plasma Glucose 108 mg/dL      Assessment & Plan:   Problem List Items Addressed This Visit    Peripheral neuropathy - Primary    Suspected idiopathic, bilateral stocking glove  pattern, no improvement so far - Labs unremarkable B12, A1c, TSH, chemistry - Failed SNRI Duloxetine (side effects), Lexapro  Plan: 1. Discussed multiple possible etiology - likely needs more diagnostic testing, and offered treatment he declines rx med at this time will discuss with Neurologist, he preferred to use OTC herbal/supplements - now on CBD Oil, stopped NerveFix - Referral to [facility] for further evaluation and Nerve Conduction Study, diagnostic and treat - would consider gabapentin, low dose TCA, muscle relaxant - however he is very sensitive to med side effects - Follow-up as needed within 3 months for update      Relevant Medications   busPIRone (BUSPAR) 5 MG tablet  Other Relevant Orders   Ambulatory referral to Neurology   Situational anxiety    Stable without worsening chronic >10 year situational anxiety, not consistent with GAD or panic disorder. No co-morbid depression / mood disorder. -GAD7 and PHQ both 0 - Failed: Prozac 20, Duloxetine 30, Lexapro 10 (side effects) - No prior Psych / counseling  Plan: 1. Switch from SSRI SNRI to now trial on Buspar 5mg  BID daily initially few weeks if not effective or if prefer only PRN use may switch to PRN - may need dose titration 2. May continue Alprazolam PRN - current rx from last visit should last >6 to 12 months - advised not permanent solution, reviewed counseling on withdrawal and risk symptoms 3. Follow-up within 3 months Anxiety PHQ/GAD7      Relevant Medications   busPIRone (BUSPAR) 5 MG tablet      Orders Placed This Encounter  Procedures  . Ambulatory referral to Neurology    Referral Priority:   Routine    Referral Type:   Consultation    Referral Reason:   Specialty Services Required    Requested Specialty:   Neurology    Number of Visits Requested:   1     Meds ordered this encounter  Medications  . busPIRone (BUSPAR) 5 MG tablet    Sig: Take 1 tablet (5 mg total) by mouth 2 (two)  times daily. Take every day 3-4 weeks, if preferred switch to only for situational anxiety    Dispense:  60 tablet    Refill:  2    Follow up plan: Return in about 3 months (around 07/15/2017) for neuropathy, anxiety med adjust.  [name], DO [facility] 04/17/2017, 10:17 AM

## 2017-04-16 NOTE — Patient Instructions (Addendum)
Thank you for coming to the office today.   1.  Referral to Neurology specialist for evaluation and Nerve Conduction Testing  [facility]   Address: [address] [place], [place] [address] Hours: 8AM-5PM Phone: [phone]  STOP Vitamin B12 for now to see if any changes  CBD Oil is fine if it helps  STOP Lexapro  Start Buspar 5mg  take one pill twice daily for now if you feel like helping or need higher dose let me know, or if too much and want to reduce to only as needed for situational anxiety that is fine too  May continue existing Alprazolam we can continue this in future if no other med works  2. Leg cramps - Try spoonful of yellow mustard to relieve leg cramps or try daily to prevent the problem  - OTC natural option is Hyland's Leg Cramps (Dissolving tablet) take as needed for muscle cramps  Please schedule a Follow-up Appointment to: Return in about 3 months (around 07/15/2017) for neuropathy, anxiety med adjust.  If you have any other questions or concerns, please feel free to call the office or send a message through [facility]. You may also schedule an earlier appointment if necessary.  Additionally, you may be receiving a survey about your experience at our office within a few days to 1 week by e-mail or mail. We value your feedback.  James Putnam, DO [facility]

## 2017-04-17 NOTE — Assessment & Plan Note (Signed)
Stable without worsening chronic >10 year situational anxiety, not consistent with GAD or panic disorder. No co-morbid depression / mood disorder. -GAD7 and PHQ both 0 - Failed: Prozac 20, Duloxetine 30, Lexapro 10 (side effects) - No prior Psych / counseling  Plan: 1. Switch from SSRI SNRI to now trial on Buspar 5mg  BID daily initially few weeks if not effective or if prefer only PRN use may switch to PRN - may need dose titration 2. May continue Alprazolam PRN - current rx from last visit should last >6 to 12 months - advised not permanent solution, reviewed counseling on withdrawal and risk symptoms 3. Follow-up within 3 months Anxiety PHQ/GAD7

## 2017-04-17 NOTE — Assessment & Plan Note (Signed)
Suspected idiopathic, bilateral stocking glove pattern, no improvement so far - Labs unremarkable B12, A1c, TSH, chemistry - Failed SNRI Duloxetine (side effects), Lexapro  Plan: 1. Discussed multiple possible etiology - likely needs more diagnostic testing, and offered treatment he declines rx med at this time will discuss with Neurologist, he preferred to use OTC herbal/supplements - now on CBD Oil, stopped NerveFix - Referral to [facility] for further evaluation and Nerve Conduction Study, diagnostic and treat - would consider gabapentin, low dose TCA, muscle relaxant - however he is very sensitive to med side effects - Follow-up as needed within 3 months for update

## 2017-04-26 ENCOUNTER — Encounter: Payer: Self-pay | Admitting: Podiatry

## 2017-04-26 ENCOUNTER — Ambulatory Visit (INDEPENDENT_AMBULATORY_CARE_PROVIDER_SITE_OTHER): Payer: Medicare Other | Admitting: Podiatry

## 2017-04-26 DIAGNOSIS — B351 Tinea unguium: Secondary | ICD-10-CM

## 2017-04-26 DIAGNOSIS — M79676 Pain in unspecified toe(s): Secondary | ICD-10-CM | POA: Diagnosis not present

## 2017-04-26 NOTE — Progress Notes (Signed)
He presents today concerned about the third toe on his left foot lateral border.  He states that is still red and swollen and has been that way for the past couple of weeks.  He states that he was prescribed some antibiotics which seemed to have made it considerably better.  And he was able to trim the nail back a little bit further.  Objective: Vital signs are stable he is alert and oriented x3.  Pulses are palpable.  Toenails are all trimmed back and many of them are bleeding plate.  He has hammertoe deformities are rigid in nature that are resulting in distal clavi and nail dystrophy's.  Assessment: Deformity nail dystrophy's.  Plan: Discussed nail care with him in great detail today and discussed surgical intervention regarding the deformities.  Follow-up with him on an as-needed basis.

## 2017-05-05 ENCOUNTER — Telehealth: Payer: Self-pay | Admitting: Urology

## 2017-05-05 NOTE — Telephone Encounter (Signed)
Pt called [facility] x2 asking for someone to please call him back, he has questions about his Rx. Pt did not leave any specifics at all except name D.O.B and return #. Please advise. Thanks.

## 2017-05-05 NOTE — Telephone Encounter (Signed)
No answer

## 2017-05-07 NOTE — Telephone Encounter (Signed)
Spoke with pt in reference to medication questions. All questions answered. Pt voiced understanding.

## 2017-05-10 DIAGNOSIS — D0439 Carcinoma in situ of skin of other parts of face: Secondary | ICD-10-CM | POA: Diagnosis not present

## 2017-05-10 DIAGNOSIS — L905 Scar conditions and fibrosis of skin: Secondary | ICD-10-CM | POA: Diagnosis not present

## 2017-05-13 ENCOUNTER — Telehealth: Payer: Self-pay | Admitting: Family Medicine

## 2017-05-13 NOTE — Telephone Encounter (Signed)
Left message for patient to call back  

## 2017-05-13 NOTE — Telephone Encounter (Signed)
Pt said he was contacted bu [facility] and it's going to be a while before he can be seen.  He asked to have another referral done to somewhere closer and maybe he can be seen sooner [phone]

## 2017-05-13 NOTE — Telephone Encounter (Addendum)
Reviewed chart.  Please notify patient of my recommendations:   The appointment with [facility] is scheduled for 06/14/17 with Dr Andrey Spearman.  This is 1 month away. I would strongly recommend that he keep this appointment. As this is a chronic problem. I do not think we can get him established any sooner with local Neurology, such as [facility]  If he absolutely just does not want to travel to [place]. He may let [place] know and we can switch the referral, but it may be the same time period >4 weeks or possibly even longer.  [name], [facility] 05/13/2017, 12:32 PM

## 2017-05-14 NOTE — Telephone Encounter (Signed)
Pt refused to go to [facility] due to transportation,  paperwork were faxed to [facility] neurology on 05/14/2017.

## 2017-05-20 ENCOUNTER — Ambulatory Visit (INDEPENDENT_AMBULATORY_CARE_PROVIDER_SITE_OTHER): Payer: Medicare Other | Admitting: Urology

## 2017-05-20 ENCOUNTER — Encounter: Payer: Self-pay | Admitting: Urology

## 2017-05-20 VITALS — BP 142/82 | HR 70 | Ht 74.0 in | Wt 196.6 lb

## 2017-05-20 DIAGNOSIS — N3281 Overactive bladder: Secondary | ICD-10-CM | POA: Diagnosis not present

## 2017-05-20 DIAGNOSIS — N529 Male erectile dysfunction, unspecified: Secondary | ICD-10-CM

## 2017-05-20 MED ORDER — SILDENAFIL CITRATE 20 MG PO TABS: ORAL_TABLET | ORAL | 6 refills | Status: DC

## 2017-05-20 NOTE — Progress Notes (Signed)
05/20/2017 8:59 AM   James Cardenas 1941/04/05 [id number]  Referring provider: Sharilyn Sites, MD [address] [place], [place] [address]  Chief Complaint  Patient presents with  . Benign Prostatic Hypertrophy    HPI: The patient is a 77 year old gentleman presents today to discuss his BPH with lower urinary tract symptoms. Hehas been on Flomax 0.8 mg daily in the past. He saw no change in his symptoms. His previous symptoms included urinary frequency up to 10 times per day, urgency with urge incontinence, nocturia 3. He denies any hesitancy, weak stream, intermittency, or having to strain to urinate. DRE previously was 2+ benign. He was started on Myrbetriq 25 mg previously.He noted a dramatic improvement with this medication. IPSS score at last visit was 3/1. He had minimal urgency and nocturia 1-2. He was very pleased with the change this medication had provided. His urge incontinence has essentially resolved. He stopped his flomax at his previous visit.  He recently had to increase his dose to 50 mg as it stopped working as well.  Since being on Myrbetriq 50 mg, he voids very well.  His frequency, nocturia, and urge incontinence is for the most part well controlled.  He notes 80% improvement since starting this medication.  He is very happy with it.  He also takes generic sildenafil for erectile dysfunction which works well for him.    He is requesting a refill at this time.  He does not take nitrates for chest pain.  He has no cardiac issues.  5 pills works well for him.    PMH: Past Medical History:  Diagnosis Date  . Arthritis of knee   . Hypertension   . Neuropathy     Surgical History: Past Surgical History:  Procedure Laterality Date  . NECK SURGERY  2011    Home Medications:  Allergies as of 05/20/2017   No Known Allergies     Medication List        Accurate as of 05/20/17  8:59 AM. Always use your most recent med list.          ALPRAZolam 0.5 MG  tablet Commonly known as:  XANAX TAKE 1 TABLET BY MOUTH 2 TIMES A DAY AS NEEDED FOR ANXIETY   aspirin EC 81 MG tablet Take by mouth.   BLUE-EMU MAXIMUM STRENGTH EX Apply topically.   busPIRone 5 MG tablet Commonly known as:  BUSPAR Take 1 tablet (5 mg total) by mouth 2 (two) times daily. Take every day 3-4 weeks, if preferred switch to only for situational anxiety   clindamycin 1 % gel Commonly known as:  CLINDAGEL APPLY TWICE A DAY AS DIRECTED   docusate sodium 100 MG capsule Commonly known as:  COLACE Take 100 mg by mouth daily as needed for mild constipation.   FINACEA 15 % Foam Generic drug:  Azelaic Acid Apply AS directed TO FACE daily   glucosamine-chondroitin 500-400 MG tablet Take 1 tablet by mouth 3 (three) times daily.   ibuprofen 200 MG tablet Commonly known as:  ADVIL,MOTRIN Take 200 mg by mouth every 6 (six) hours as needed.   losartan 100 MG tablet Commonly known as:  COZAAR Take 1 tablet (100 mg total) by mouth daily.   mirabegron ER 50 MG Tb24 tablet Commonly known as:  MYRBETRIQ Take 1 tablet (50 mg total) by mouth daily.   MIRALAX PO Take by mouth as needed.   MULTI-VITAMINS Tabs Take by mouth.   Omega-3 1000 MG Caps Take by mouth.  sildenafil 20 MG tablet Commonly known as:  REVATIO Take 1-5 pills about 30 min prior to sex.   sildenafil 20 MG tablet Commonly known as:  REVATIO Take 1 to 5 tabs PO daily prn   tretinoin 0.1 % cream Commonly known as:  RETIN-A APPLY A PEA SIZE AMOUNT TO FACE NIGHTLY   triamcinolone cream 0.1 % Commonly known as:  KENALOG APPLY TO AFFECTED AREAS OF LEGS AND CHEST TWICE A DAY AS NEEDED 2WEEK ON 2 WEEKS OFF   Vitamin B 12 100 MCG Lozg Take by mouth.       Allergies: No Known Allergies  Family History: Family History  Problem Relation Age of Onset  . Thyroid disease Mother   . Lung cancer Father   . Pancreatic cancer Brother   . Prostate cancer Neg Hx   . Bladder Cancer Neg Hx   . Kidney  cancer Neg Hx     Social History:  reports that  has never smoked. he has never used smokeless tobacco. He reports that he drinks about 4.8 oz of alcohol per week. He reports that he does not use drugs.  ROS: UROLOGY Frequent Urination?: No Hard to postpone urination?: No Burning/pain with urination?: No Get up at night to urinate?: No Leakage of urine?: No Urine stream starts and stops?: No Trouble starting stream?: No Do you have to strain to urinate?: No Blood in urine?: No Urinary tract infection?: No Sexually transmitted disease?: No Injury to kidneys or bladder?: No Painful intercourse?: No Weak stream?: No Erection problems?: No Penile pain?: No  Gastrointestinal Nausea?: No Vomiting?: No Indigestion/heartburn?: No Diarrhea?: No Constipation?: Yes  Constitutional Fever: No Night sweats?: No Weight loss?: No Fatigue?: No  Skin Skin rash/lesions?: No Itching?: No  Eyes Blurred vision?: No Double vision?: No  Ears/Nose/Throat Sore throat?: No Sinus problems?: No  Hematologic/Lymphatic Swollen glands?: No Easy bruising?: No  Cardiovascular Leg swelling?: No Chest pain?: No  Respiratory Cough?: No Shortness of breath?: No  Endocrine Excessive thirst?: No  Musculoskeletal Back pain?: No Joint pain?: No  Neurological Headaches?: No Dizziness?: No  Psychologic Depression?: No Anxiety?: No  Physical Exam: BP (!) 142/82 (BP Location: Right Arm, Patient Position: Sitting, Cuff Size: Normal)   Pulse 70   Ht 6\' 2"  (1.88 m)   Wt 196 lb 9.6 oz (89.2 kg)   BMI 25.24 kg/m   Constitutional:  Alert and oriented, No acute distress. HEENT: [place] AT, moist mucus membranes.  Trachea midline, no masses. Cardiovascular: No clubbing, cyanosis, or edema. Respiratory: Normal respiratory effort, no increased work of breathing. GI: Abdomen is soft, nontender, nondistended, no abdominal masses GU: No CVA tenderness.  Skin: No rashes, bruises or suspicious  lesions. Lymph: No cervical or inguinal adenopathy. Neurologic: Grossly intact, no focal deficits, moving all 4 extremities. Psychiatric: Normal mood and affect.  Laboratory Data: No results found for: WBC, HGB, HCT, MCV, PLT  Lab Results  Component Value Date   CREATININE 0.89 11/09/2015    Lab Results  Component Value Date   PSA 2.30 11/06/2013    No results found for: TESTOSTERONE  Lab Results  Component Value Date   HGBA1C 5.4 09/29/2016    Urinalysis    Component Value Date/Time   APPEARANCEUR Clear 06/11/2016 1117   GLUCOSEU Negative 06/11/2016 1117   BILIRUBINUR Negative 06/11/2016 1117   PROTEINUR Negative 06/11/2016 1117   NITRITE Negative 06/11/2016 1117   LEUKOCYTESUR Negative 06/11/2016 1117     Assessment & Plan:    1.  OAB Continue Myrbetriq 50 mg daily  2. ED Will refill sildenafil today  Return in about 1 year (around 05/20/2018).  Nickie Retort, MD  [facility] [address], [place], [place] [address] [phone]

## 2017-05-24 ENCOUNTER — Ambulatory Visit (INDEPENDENT_AMBULATORY_CARE_PROVIDER_SITE_OTHER): Payer: Medicare Other | Admitting: Podiatry

## 2017-05-24 ENCOUNTER — Encounter: Payer: Self-pay | Admitting: Podiatry

## 2017-05-24 DIAGNOSIS — M2041 Other hammer toe(s) (acquired), right foot: Secondary | ICD-10-CM

## 2017-05-24 DIAGNOSIS — M79675 Pain in left toe(s): Secondary | ICD-10-CM

## 2017-05-24 DIAGNOSIS — L84 Corns and callosities: Secondary | ICD-10-CM | POA: Diagnosis not present

## 2017-05-24 DIAGNOSIS — B351 Tinea unguium: Secondary | ICD-10-CM | POA: Diagnosis not present

## 2017-05-24 DIAGNOSIS — M2042 Other hammer toe(s) (acquired), left foot: Secondary | ICD-10-CM

## 2017-05-24 NOTE — Progress Notes (Signed)
This patient presents the office with chief complaint of painful toenail on the outside border third toe.  He was seen by Dr. Milinda Pointer approximately 4 weeks ago who worked on his nail and told him to make an appointment with me.  He presents the office today stating that he is having pain and discomfort on the tip of his third toe, left foot. He says Dr. Milinda Pointer worked on his nail, but the pain continues.  He presents the office today for an evaluation of his painful third toe, left foot.  Patient says he has neuropathy.   General Appearance  Alert, conversant and in no acute stress.  Vascular  Dorsalis pedis right is palpable and dorsalis pedis left foot is not palpable. Posterior tibial pulse right is not  palpable and is  palpable left foot.  Capillary return is within normal limits  bilaterally. Temperature is within normal limits  Bilaterally.  Neurologic  Senn-Weinstein monofilament wire test within normal limits  bilaterally. Muscle power within normal limits bilaterally.  Nails Normal appearing nails both feet which are ingrowing except hallux nails  The hallux nails are thick disfigured and ingrown. Marked incurvation lateral border third toe left foot.  Orthopedic  No limitations of motion of motion feet bilaterally.  No crepitus or effusions noted.  Hammer toes 2-5  B/L.  Skin  normotropic skin with no porokeratosis noted bilaterally.  No signs of infections or ulcers noted. Clavi noted distal aspect 2,3 left foot.   Clavi left foot  Ingrowing toenail 3rd toenail .    ROV  Debride nail 3rd left.  Debride clavi 3rd toe left foot.   RTC 10 weeks.   Gardiner Barefoot DPM

## 2017-06-07 ENCOUNTER — Ambulatory Visit (INDEPENDENT_AMBULATORY_CARE_PROVIDER_SITE_OTHER): Payer: Medicare Other | Admitting: Podiatry

## 2017-06-07 ENCOUNTER — Ambulatory Visit: Payer: Medicare Other

## 2017-06-07 ENCOUNTER — Encounter: Payer: Self-pay | Admitting: Podiatry

## 2017-06-07 DIAGNOSIS — M2042 Other hammer toe(s) (acquired), left foot: Secondary | ICD-10-CM

## 2017-06-07 DIAGNOSIS — M2041 Other hammer toe(s) (acquired), right foot: Secondary | ICD-10-CM

## 2017-06-07 DIAGNOSIS — L97521 Non-pressure chronic ulcer of other part of left foot limited to breakdown of skin: Secondary | ICD-10-CM

## 2017-06-07 DIAGNOSIS — L84 Corns and callosities: Secondary | ICD-10-CM

## 2017-06-07 NOTE — Progress Notes (Signed)
This patient presents the office with chief complaint of painful toenail on the fourth toe left foot.  He has been having pain to the third and fourth toes left foot when pressure is applied by the bedsheet.  He initially had problems with this nail was seen by Dr. Milinda Pointer and there was seen by myself for the same.  He now returns 2 weeks later and still is having difficulty with the third and fourth toenails on the left foot .  He points to the outside toenail on the third toe  left foot and points to the inside of the fourth toe left foot as the sites of his pain  E says he is returning to the office today discuss further treatment for these 2 toes on his left foot.   General Appearance  Alert, conversant and in no acute stress.  Vascular  Dorsalis pedis right is palpable and dorsalis pedis left foot is not palpable. Posterior tibial pulse right is not  palpable and is  palpable left foot.  Capillary return is within normal limits  bilaterally. Temperature is within normal limits  Bilaterally.  Neurologic  Senn-Weinstein monofilament wire test within normal limits  bilaterally. Muscle power within normal limits bilaterally.  Nails Normal appearing nails both feet which are ingrowing except hallux nails  The hallux nails are thick disfigured and ingrown. Marked incurvation lateral border third toe left foot.  Orthopedic  No limitations of motion of motion feet bilaterally.  No crepitus or effusions noted.  Hammer toes 2-5  B/L.  Skin  normotropic skin with no porokeratosis noted bilaterally.  No signs of infections or ulcers noted. Clavi noted distal aspect ,3 left foot. Small  ulceration noted at the distal aspect of the medial nail groove fourth toe left foot.  No infection or drainage noted. Callus noted at the site of the lateral border third toenail, left foot.    Ulceration fourth toe left foot.   ROV  Debride callus third toe left.  Neosporin/DSD applied fourth toe left.  Neosporin/DSD.   Patient was told to use Vaseline at the site of the callus third toe and to peroxide the fourth toe, followed by bandaging.   RTC 10 weeks.   Gardiner Barefoot DPM

## 2017-06-08 DIAGNOSIS — G629 Polyneuropathy, unspecified: Secondary | ICD-10-CM | POA: Diagnosis not present

## 2017-06-08 DIAGNOSIS — R202 Paresthesia of skin: Secondary | ICD-10-CM | POA: Diagnosis not present

## 2017-06-08 DIAGNOSIS — M720 Palmar fascial fibromatosis [Dupuytren]: Secondary | ICD-10-CM | POA: Diagnosis not present

## 2017-06-08 DIAGNOSIS — Z79899 Other long term (current) drug therapy: Secondary | ICD-10-CM | POA: Diagnosis not present

## 2017-06-08 DIAGNOSIS — M79604 Pain in right leg: Secondary | ICD-10-CM | POA: Diagnosis not present

## 2017-06-08 DIAGNOSIS — R200 Anesthesia of skin: Secondary | ICD-10-CM | POA: Diagnosis not present

## 2017-06-08 DIAGNOSIS — E559 Vitamin D deficiency, unspecified: Secondary | ICD-10-CM | POA: Diagnosis not present

## 2017-06-08 DIAGNOSIS — M79605 Pain in left leg: Secondary | ICD-10-CM | POA: Diagnosis not present

## 2017-06-10 DIAGNOSIS — Z79899 Other long term (current) drug therapy: Secondary | ICD-10-CM | POA: Diagnosis not present

## 2017-06-14 ENCOUNTER — Ambulatory Visit: Payer: Medicare Other | Admitting: Diagnostic Neuroimaging

## 2017-06-15 DIAGNOSIS — M79605 Pain in left leg: Secondary | ICD-10-CM | POA: Diagnosis not present

## 2017-06-15 DIAGNOSIS — M79604 Pain in right leg: Secondary | ICD-10-CM | POA: Diagnosis not present

## 2017-06-15 DIAGNOSIS — Z79899 Other long term (current) drug therapy: Secondary | ICD-10-CM | POA: Diagnosis not present

## 2017-06-16 DIAGNOSIS — M720 Palmar fascial fibromatosis [Dupuytren]: Secondary | ICD-10-CM | POA: Diagnosis not present

## 2017-06-16 DIAGNOSIS — M67442 Ganglion, left hand: Secondary | ICD-10-CM | POA: Diagnosis not present

## 2017-06-30 ENCOUNTER — Encounter: Payer: Self-pay | Admitting: Oncology

## 2017-06-30 ENCOUNTER — Inpatient Hospital Stay: Payer: Medicare Other

## 2017-06-30 ENCOUNTER — Other Ambulatory Visit: Payer: Self-pay

## 2017-06-30 ENCOUNTER — Inpatient Hospital Stay: Payer: Medicare Other | Attending: Oncology | Admitting: Oncology

## 2017-06-30 VITALS — BP 145/75 | HR 68 | Temp 97.6°F | Resp 18 | Wt 199.0 lb

## 2017-06-30 DIAGNOSIS — Z79899 Other long term (current) drug therapy: Secondary | ICD-10-CM | POA: Diagnosis not present

## 2017-06-30 DIAGNOSIS — D890 Polyclonal hypergammaglobulinemia: Principal | ICD-10-CM

## 2017-06-30 DIAGNOSIS — G629 Polyneuropathy, unspecified: Secondary | ICD-10-CM

## 2017-06-30 DIAGNOSIS — D472 Monoclonal gammopathy: Secondary | ICD-10-CM

## 2017-06-30 DIAGNOSIS — Z800 Family history of malignant neoplasm of digestive organs: Secondary | ICD-10-CM

## 2017-06-30 DIAGNOSIS — Z801 Family history of malignant neoplasm of trachea, bronchus and lung: Secondary | ICD-10-CM

## 2017-06-30 LAB — CBC WITH DIFFERENTIAL/PLATELET
Basophils Absolute: 0 10*3/uL (ref 0–0.1)
Basophils Relative: 1 %
Eosinophils Absolute: 0.3 10*3/uL (ref 0–0.7)
Eosinophils Relative: 6 %
HCT: 40.5 % (ref 40.0–52.0)
Hemoglobin: 14 g/dL (ref 13.0–18.0)
Lymphocytes Relative: 16 %
Lymphs Abs: 0.7 10*3/uL — ABNORMAL LOW (ref 1.0–3.6)
MCH: 33.4 pg (ref 26.0–34.0)
MCHC: 34.5 g/dL (ref 32.0–36.0)
MCV: 96.8 fL (ref 80.0–100.0)
Monocytes Absolute: 0.5 10*3/uL (ref 0.2–1.0)
Monocytes Relative: 11 %
Neutro Abs: 2.8 10*3/uL (ref 1.4–6.5)
Neutrophils Relative %: 66 %
Platelets: 188 10*3/uL (ref 150–440)
RBC: 4.18 MIL/uL — ABNORMAL LOW (ref 4.40–5.90)
RDW: 13.7 % (ref 11.5–14.5)
WBC: 4.3 10*3/uL (ref 3.8–10.6)

## 2017-06-30 LAB — COMPREHENSIVE METABOLIC PANEL
ALT: 34 U/L (ref 17–63)
AST: 43 U/L — ABNORMAL HIGH (ref 15–41)
Albumin: 3.9 g/dL (ref 3.5–5.0)
Alkaline Phosphatase: 77 U/L (ref 38–126)
Anion gap: 7 (ref 5–15)
BUN: 25 mg/dL — ABNORMAL HIGH (ref 6–20)
CO2: 23 mmol/L (ref 22–32)
Calcium: 9 mg/dL (ref 8.9–10.3)
Chloride: 106 mmol/L (ref 101–111)
Creatinine, Ser: 0.81 mg/dL (ref 0.61–1.24)
GFR calc Af Amer: >60 mL/min (ref >60)
GFR calc non Af Amer: >60 mL/min (ref >60)
Glucose, Bld: 109 mg/dL — ABNORMAL HIGH (ref 65–99)
Potassium: 4.3 mmol/L (ref 3.5–5.1)
Sodium: 136 mmol/L (ref 135–145)
Total Bilirubin: 1.2 mg/dL (ref 0.3–1.2)
Total Protein: 7.4 g/dL (ref 6.5–8.1)

## 2017-06-30 NOTE — Progress Notes (Signed)
Hematology/Oncology Consult note [name] [phone] [phone]   Patient Care Team: Olin Hauser, DO as PCP - General (Family Medicine) Renata Caprice as Physician Assistant (Orthopedic Surgery)  REFERRING PROVIDER: Dr.Shah  CHIEF COMPLAINTS/PURPOSE OF CONSULTATION:  Evaluation of elevated M spike.  HISTORY OF PRESENTING ILLNESS:  James Cardenas is a  77 y.o.  male with PMH listed below who was referred to me for evaluation of elevated M spike.  Patient follows up with [facility] clinic Neurology Dr. Brigitte Pulse for evaluation of neuropathy.  Patient states that symptom has started in 2060 has slightly progressed.  Mostly focus on posterior lower extremity, feels like bilateral aching, soreness, tightness.  Intermittently he will also have numbness and tingling in his feet without burning and pain.  He has had extensive workup done including normal thyroid function, normal B12, ESR, ANA, rheumatoid factor, diabetes screening, heavy metal screening.Marland Kitchen  He has had SPEP and immunofixation which showed IgG monoclonal antibody with kappa light chain specificity, M spike 0.7 g/dL.  Patient denies any bone pain.  He has good energy level and remains very active.  No raynaud's syndrome    Review of Systems  Constitutional: Negative for chills, fever, malaise/fatigue and weight loss.  HENT: Negative for ear discharge and ear pain.   Eyes: Negative for blurred vision, double vision and pain.  Respiratory: Negative for cough, hemoptysis and sputum production.   Cardiovascular: Negative for chest pain and orthopnea.  Gastrointestinal: Negative for nausea and vomiting.  Genitourinary: Negative for dysuria and urgency.  Musculoskeletal: Negative for myalgias and neck pain.  Skin: Negative for rash.  Neurological: Negative for tingling, tremors and focal weakness.  Psychiatric/Behavioral: Negative for depression.    MEDICAL HISTORY:  Past  Medical History:  Diagnosis Date  . Arthritis of knee   . Hypertension   . Neuropathy     SURGICAL HISTORY: Past Surgical History:  Procedure Laterality Date  . NECK SURGERY  2011    SOCIAL HISTORY: Social History   Socioeconomic History  . Marital status: Divorced    Spouse name: Not on file  . Number of children: Not on file  . Years of education: Not on file  . Highest education level: Not on file  Social Needs  . Financial resource strain: Not on file  . Food insecurity - worry: Not on file  . Food insecurity - inability: Not on file  . Transportation needs - medical: Not on file  . Transportation needs - non-medical: Not on file  Occupational History  . Occupation: Retired Designer, multimedia, Public affairs consultant / heat treating)  Tobacco Use  . Smoking status: Never Smoker  . Smokeless tobacco: Never Used  Substance and Sexual Activity  . Alcohol use: Yes    Alcohol/week: 4.8 oz    Types: 7 Glasses of wine, 1 Cans of beer per week  . Drug use: No  . Sexual activity: Yes  Other Topics Concern  . Not on file  Social History Narrative  . Not on file    FAMILY HISTORY: Family History  Problem Relation Age of Onset  . Thyroid disease Mother   . Lung cancer Father   . Pancreatic cancer Brother   . Prostate cancer Neg Hx   . Bladder Cancer Neg Hx   . Kidney cancer Neg Hx     ALLERGIES:  has No Known Allergies.  MEDICATIONS:  Current Outpatient Medications  Medication Sig Dispense Refill  . ALPRAZolam (XANAX) 0.5 MG tablet TAKE  1 TABLET BY MOUTH 2 TIMES A DAY AS NEEDED FOR ANXIETY 60 tablet 2  . Cyanocobalamin (VITAMIN B 12) 100 MCG LOZG Take by mouth.    Marland Kitchen FINACEA 15 % FOAM Apply AS directed TO FACE daily  6  . glucosamine-chondroitin 500-400 MG tablet Take 1 tablet by mouth 3 (three) times daily.    Marland Kitchen ibuprofen (ADVIL,MOTRIN) 200 MG tablet Take 200 mg by mouth every 6 (six) hours as needed.    Marland Kitchen losartan (COZAAR) 100 MG tablet Take 1 tablet (100 mg total) by mouth  daily. 90 tablet 3  . mirabegron ER (MYRBETRIQ) 50 MG TB24 tablet Take 1 tablet (50 mg total) by mouth daily. 90 tablet 1  . Multiple Vitamin (MULTI-VITAMINS) TABS Take by mouth.    . Omega-3 1000 MG CAPS Take by mouth.    . sildenafil (REVATIO) 20 MG tablet Take 1 to 5 tabs PO daily prn 50 tablet 6  . triamcinolone cream (KENALOG) 0.1 % APPLY TO AFFECTED AREAS OF LEGS AND CHEST TWICE A DAY AS NEEDED 2WEEK ON 2 WEEKS OFF  0  . Vitamin D, Ergocalciferol, (DRISDOL) 50000 units CAPS capsule Take by mouth.    . busPIRone (BUSPAR) 5 MG tablet Take 1 tablet (5 mg total) by mouth 2 (two) times daily. Take every day 3-4 weeks, if preferred switch to only for situational anxiety (Patient not taking: Reported on 06/30/2017) 60 tablet 2  . clindamycin (CLINDAGEL) 1 % gel APPLY TWICE A DAY AS DIRECTED  3  . docusate sodium (COLACE) 100 MG capsule Take 100 mg by mouth daily as needed for mild constipation.    . Menthol, Topical Analgesic, (BLUE-EMU MAXIMUM STRENGTH EX) Apply topically.    . Polyethylene Glycol 3350 (MIRALAX PO) Take by mouth as needed.    . tretinoin (RETIN-A) 0.1 % cream APPLY A PEA SIZE AMOUNT TO FACE NIGHTLY  0   No current facility-administered medications for this visit.      PHYSICAL EXAMINATION: ECOG PERFORMANCE STATUS: 0 - Asymptomatic Vitals:   06/30/17 1125  BP: (!) 145/75  Pulse: 68  Resp: 18  Temp: 97.6 F (36.4 C)   Filed Weights   06/30/17 1125  Weight: 199 lb (90.3 kg)    Physical Exam  Constitutional: He is oriented to person, place, and time. No distress.  HENT:  Head: Normocephalic and atraumatic.  Mouth/Throat: No oropharyngeal exudate.  Eyes: Conjunctivae and EOM are normal. Pupils are equal, round, and reactive to light. No scleral icterus.  Neck: Normal range of motion. Neck supple.  Cardiovascular:  Murmur heard. Pulmonary/Chest: Effort normal and breath sounds normal. No respiratory distress. He has no wheezes.  Abdominal: Soft. Bowel sounds are  normal. He exhibits no distension.  Musculoskeletal: Normal range of motion. He exhibits no edema.  Lymphadenopathy:    He has no cervical adenopathy.  Neurological: He is oriented to person, place, and time. Coordination normal.  Skin: Skin is warm.  Psychiatric: Affect and judgment normal.     LABORATORY DATA:  I have reviewed the data as listed Lab Results  Component Value Date   WBC 4.3 06/30/2017   HGB 14.0 06/30/2017   HCT 40.5 06/30/2017   MCV 96.8 06/30/2017   PLT 188 06/30/2017   Recent Labs    06/30/17 1210  NA 136  K 4.3  CL 106  CO2 23  GLUCOSE 109*  BUN 25*  CREATININE 0.81  CALCIUM 9.0  GFRNONAA >60  GFRAA >60  PROT 7.4  ALBUMIN 3.9  AST 43*  ALT 34  ALKPHOS 77  BILITOT 1.2       ASSESSMENT & PLAN:  1. Gammopathy with multiple M spikes   2. Neuropathy    Discussed with patient will repeat his SPEP, IFE, light chain ratio to confirm diagnosis. Likely MGUS, and explained to him that this is a pre myeloma state. Only very small portion of patient progressed to active multiple myeloma, roughly 1% per year.  Will discuss with him about results in 2 weeks.   All questions were answered. The patient knows to call the clinic with any problems questions or concerns.  Return of visit: 2 weeks.  Thank you for this kind referral and the opportunity to participate in the care of this patient. A copy of today's note is routed to referring provider    Earlie Server, MD, PhD Hematology Oncology [facility] Pager- [phone] 06/30/2017

## 2017-06-30 NOTE — Progress Notes (Signed)
Pt in as a new patient visit.  Pt states "abnormal blood test".

## 2017-07-01 LAB — KAPPA/LAMBDA LIGHT CHAINS
Kappa free light chain: 20.6 mg/L — ABNORMAL HIGH (ref 3.3–19.4)
Kappa, lambda light chain ratio: 1.57 (ref 0.26–1.65)
Lambda free light chains: 13.1 mg/L (ref 5.7–26.3)

## 2017-07-02 LAB — MULTIPLE MYELOMA PANEL, SERUM
Albumin SerPl Elph-Mcnc: 3.9 g/dL (ref 2.9–4.4)
Albumin/Glob SerPl: 1.4 (ref 0.7–1.7)
Alpha 1: 0.2 g/dL (ref 0.0–0.4)
Alpha2 Glob SerPl Elph-Mcnc: 0.5 g/dL (ref 0.4–1.0)
B-Globulin SerPl Elph-Mcnc: 1 g/dL (ref 0.7–1.3)
Gamma Glob SerPl Elph-Mcnc: 1.3 g/dL (ref 0.4–1.8)
Globulin, Total: 3 g/dL (ref 2.2–3.9)
IgA: 263 mg/dL (ref 61–437)
IgG (Immunoglobin G), Serum: 1459 mg/dL (ref 700–1600)
IgM (Immunoglobulin M), Srm: 41 mg/dL (ref 15–143)
M Protein SerPl Elph-Mcnc: 0.5 g/dL — ABNORMAL HIGH
Total Protein ELP: 6.9 g/dL (ref 6.0–8.5)

## 2017-07-06 ENCOUNTER — Encounter (INDEPENDENT_AMBULATORY_CARE_PROVIDER_SITE_OTHER): Payer: Self-pay | Admitting: Vascular Surgery

## 2017-07-06 ENCOUNTER — Ambulatory Visit (INDEPENDENT_AMBULATORY_CARE_PROVIDER_SITE_OTHER): Payer: Medicare Other | Admitting: Vascular Surgery

## 2017-07-06 ENCOUNTER — Encounter (INDEPENDENT_AMBULATORY_CARE_PROVIDER_SITE_OTHER): Payer: Medicare Other | Admitting: Vascular Surgery

## 2017-07-06 VITALS — BP 118/59 | HR 59 | Resp 16 | Ht 74.0 in | Wt 200.0 lb

## 2017-07-06 DIAGNOSIS — M79604 Pain in right leg: Secondary | ICD-10-CM

## 2017-07-06 DIAGNOSIS — I10 Essential (primary) hypertension: Secondary | ICD-10-CM | POA: Diagnosis not present

## 2017-07-06 DIAGNOSIS — G609 Hereditary and idiopathic neuropathy, unspecified: Secondary | ICD-10-CM | POA: Diagnosis not present

## 2017-07-06 DIAGNOSIS — M79605 Pain in left leg: Secondary | ICD-10-CM | POA: Diagnosis not present

## 2017-07-06 DIAGNOSIS — M79609 Pain in unspecified limb: Secondary | ICD-10-CM | POA: Insufficient documentation

## 2017-07-06 DIAGNOSIS — R600 Localized edema: Secondary | ICD-10-CM | POA: Diagnosis not present

## 2017-07-06 NOTE — Patient Instructions (Signed)
Peripheral Vascular Disease Peripheral vascular disease (PVD) is a disease of the blood vessels that are not part of your heart and brain. A simple term for PVD is poor circulation. In most cases, PVD narrows the blood vessels that carry blood from your heart to the rest of your body. This can result in a decreased supply of blood to your arms, legs, and internal organs, like your stomach or kidneys. However, it most often affects a person's lower legs and feet. There are two types of PVD.  Organic PVD. This is the more common type. It is caused by damage to the structure of blood vessels.  Functional PVD. This is caused by conditions that make blood vessels contract and tighten (spasm).  Without treatment, PVD tends to get worse over time. PVD can also lead to acute ischemic limb. This is when an arm or limb suddenly has trouble getting enough blood. This is a medical emergency. What are the causes? Each type of PVD has many different causes. The most common cause of PVD is buildup of a fatty material (plaque) inside of your arteries (atherosclerosis). Small amounts of plaque can break off from the walls of the blood vessels and become lodged in a smaller artery. This blocks blood flow and can cause acute ischemic limb. Other common causes of PVD include:  Blood clots that form inside of blood vessels.  Injuries to blood vessels.  Diseases that cause inflammation of blood vessels or cause blood vessel spasms.  Health behaviors and health history that increase your risk of developing PVD.  What increases the risk? You may have a greater risk of PVD if you:  Have a family history of PVD.  Have certain medical conditions, including: ? High cholesterol. ? Diabetes. ? High blood pressure (hypertension). ? Coronary heart disease. ? Past problems with blood clots. ? Past injury, such as burns or a broken bone. These may have damaged blood vessels in your limbs. ? Buerger disease. This is  caused by inflamed blood vessels in your hands and feet. ? Some forms of arthritis. ? Rare birth defects that affect the arteries in your legs.  Use tobacco.  Do not get enough exercise.  Are obese.  Are age 50 or older.  What are the signs or symptoms? PVD may cause many different symptoms. Your symptoms depend on what part of your body is not getting enough blood. Some common signs and symptoms include:  Cramps in your lower legs. This may be a symptom of poor leg circulation (claudication).  Pain and weakness in your legs while you are physically active that goes away when you rest (intermittent claudication).  Leg pain when at rest.  Leg numbness, tingling, or weakness.  Coldness in a leg or foot, especially when compared with the other leg.  Skin or hair changes. These can include: ? Hair loss. ? Shiny skin. ? Pale or bluish skin. ? Thick toenails.  Inability to get or maintain an erection (erectile dysfunction).  People with PVD are more prone to developing ulcers and sores on their toes, feet, or legs. These may take longer than normal to heal. How is this diagnosed? Your health care provider may diagnose PVD from your signs and symptoms. The health care provider will also do a physical exam. You may have tests to find out what is causing your PVD and determine its severity. Tests may include:  Blood pressure recordings from your arms and legs and measurements of the strength of your pulses (  pulse volume recordings).  Imaging studies using sound waves to take pictures of the blood flow through your blood vessels (Doppler ultrasound).  Injecting a dye into your blood vessels before having imaging studies using: ? X-rays (angiogram or arteriogram). ? Computer-generated X-rays (CT angiogram). ? A powerful electromagnetic field and a computer (magnetic resonance angiogram or MRA).  How is this treated? Treatment for PVD depends on the cause of your condition and the  severity of your symptoms. It also depends on your age. Underlying causes need to be treated and controlled. These include long-lasting (chronic) conditions, such as diabetes, high cholesterol, and high blood pressure. You may need to first try making lifestyle changes and taking medicines. Surgery may be needed if these do not work. Lifestyle changes may include:  Quitting smoking.  Exercising regularly.  Following a low-fat, low-cholesterol diet.  Medicines may include:  Blood thinners to prevent blood clots.  Medicines to improve blood flow.  Medicines to improve your blood cholesterol levels.  Surgical procedures may include:  A procedure that uses an inflated balloon to open a blocked artery and improve blood flow (angioplasty).  A procedure to put in a tube (stent) to keep a blocked artery open (stent implant).  Surgery to reroute blood flow around a blocked artery (peripheral bypass surgery).  Surgery to remove dead tissue from an infected wound on the affected limb.  Amputation. This is surgical removal of the affected limb. This may be necessary in cases of acute ischemic limb that are not improved through medical or surgical treatments.  Follow these instructions at home:  Take medicines only as directed by your health care provider.  Do not use any tobacco products, including cigarettes, chewing tobacco, or electronic cigarettes. If you need help quitting, ask your health care provider.  Lose weight if you are overweight, and maintain a healthy weight as directed by your health care provider.  Eat a diet that is low in fat and cholesterol. If you need help, ask your health care provider.  Exercise regularly. Ask your health care provider to suggest some good activities for you.  Use compression stockings or other mechanical devices as directed by your health care provider.  Take good care of your feet. ? Wear comfortable shoes that fit well. ? Check your feet  often for any cuts or sores. Contact a health care provider if:  You have cramps in your legs while walking.  You have leg pain when you are at rest.  You have coldness in a leg or foot.  Your skin changes.  You have erectile dysfunction.  You have cuts or sores on your feet that are not healing. Get help right away if:  Your arm or leg turns cold and blue.  Your arms or legs become red, warm, swollen, painful, or numb.  You have chest pain or trouble breathing.  You suddenly have weakness in your face, arm, or leg.  You become very confused or lose the ability to speak.  You suddenly have a very bad headache or lose your vision. This information is not intended to replace advice given to you by your health care provider. Make sure you discuss any questions you have with your health care provider. Document Released: 05/07/2004 Document Revised: 09/05/2015 Document Reviewed: 09/07/2013 Elsevier Interactive Patient Education  2017 [employer]  

## 2017-07-06 NOTE — Assessment & Plan Note (Signed)
Unclear the etiology.  Compression stockings and leg elevation.  Venous reflux study as below.

## 2017-07-06 NOTE — Assessment & Plan Note (Signed)
Although vascular disease is rarely a primary cause, it could be exacerbating his peripheral neuropathy.

## 2017-07-06 NOTE — Assessment & Plan Note (Signed)

## 2017-07-06 NOTE — Progress Notes (Signed)
Patient ID: James Cardenas, male   DOB: Aug 28, 1940, 77 y.o.   MRN: [id number]  Chief Complaint  Patient presents with  . New Patient (Initial Visit)    BLE Pain    HPI James Cardenas is a 77 y.o. male.  I am asked to see the patient by E. Ouma, NP for evaluation of leg pain.  The patient reports an ongoing workup for neuropathy.  This largely affects his legs but he does get some tingling and numbness in his hands as well.  This has been a progressive problems over months to years.  No sign of diabetes.  No clear cause of the symptoms.  He complains of puffiness in his feet as well as a pins and needles sensation in his feet.  He denies ulceration or infection.  He stays fairly active although he does have chronic burning in his legs.  To assess his vascular status as a potential cause or exacerbating status, he was referred to our office.   Past Medical History:  Diagnosis Date  . Anxiety   . Arthritis of knee   . Hypertension   . Murmur, cardiac   . Neuropathy     Past Surgical History:  Procedure Laterality Date  . NECK SURGERY  2011    Family History  Problem Relation Age of Onset  . Thyroid disease Mother   . Lung cancer Father   . Pancreatic cancer Brother   . Prostate cancer Neg Hx   . Bladder Cancer Neg Hx   . Kidney cancer Neg Hx     Social History Social History   Tobacco Use  . Smoking status: Never Smoker  . Smokeless tobacco: Never Used  Substance Use Topics  . Alcohol use: Yes    Alcohol/week: 4.8 oz    Types: 7 Glasses of wine, 1 Cans of beer per week  . Drug use: No    No Known Allergies  Current Outpatient Medications  Medication Sig Dispense Refill  . ALPRAZolam (XANAX) 0.5 MG tablet TAKE 1 TABLET BY MOUTH 2 TIMES A DAY AS NEEDED FOR ANXIETY 60 tablet 2  . busPIRone (BUSPAR) 5 MG tablet Take 1 tablet (5 mg total) by mouth 2 (two) times daily. Take every day 3-4 weeks, if preferred switch to only for situational anxiety (Patient not taking:  Reported on 06/30/2017) 60 tablet 2  . clindamycin (CLINDAGEL) 1 % gel APPLY TWICE A DAY AS DIRECTED  3  . Cyanocobalamin (VITAMIN B 12) 100 MCG LOZG Take by mouth.    . docusate sodium (COLACE) 100 MG capsule Take 100 mg by mouth daily as needed for mild constipation.    Marland Kitchen FINACEA 15 % FOAM Apply AS directed TO FACE daily  6  . glucosamine-chondroitin 500-400 MG tablet Take 1 tablet by mouth 3 (three) times daily.    Marland Kitchen ibuprofen (ADVIL,MOTRIN) 200 MG tablet Take 200 mg by mouth every 6 (six) hours as needed.    Marland Kitchen losartan (COZAAR) 100 MG tablet Take 1 tablet (100 mg total) by mouth daily. 90 tablet 3  . Menthol, Topical Analgesic, (BLUE-EMU MAXIMUM STRENGTH EX) Apply topically.    . mirabegron ER (MYRBETRIQ) 50 MG TB24 tablet Take 1 tablet (50 mg total) by mouth daily. 90 tablet 1  . Multiple Vitamin (MULTI-VITAMINS) TABS Take by mouth.    . Omega-3 1000 MG CAPS Take by mouth.    . Polyethylene Glycol 3350 (MIRALAX PO) Take by mouth as needed.    Marland Kitchen  sildenafil (REVATIO) 20 MG tablet Take 1 to 5 tabs PO daily prn 50 tablet 6  . tretinoin (RETIN-A) 0.1 % cream APPLY A PEA SIZE AMOUNT TO FACE NIGHTLY  0  . triamcinolone cream (KENALOG) 0.1 % APPLY TO AFFECTED AREAS OF LEGS AND CHEST TWICE A DAY AS NEEDED 2WEEK ON 2 WEEKS OFF  0  . Vitamin D, Ergocalciferol, (DRISDOL) 50000 units CAPS capsule Take by mouth.     No current facility-administered medications for this visit.       REVIEW OF SYSTEMS (Negative unless checked)  Constitutional: []Weight loss  []Fever  []Chills Cardiac: []Chest pain   []Chest pressure   []Palpitations   []Shortness of breath when laying flat   []Shortness of breath at rest   []Shortness of breath with exertion. Vascular:  []Pain in legs with walking   []Pain in legs at rest   []Pain in legs when laying flat   []Claudication   []Pain in feet when walking  []Pain in feet at rest  []Pain in feet when laying flat   []History of DVT   []Phlebitis   []Swelling in legs    []Varicose veins   []Non-healing ulcers Pulmonary:   []Uses home oxygen   []Productive cough   []Hemoptysis   []Wheeze  []COPD   []Asthma Neurologic:  []Dizziness  []Blackouts   []Seizures   []History of stroke   []History of TIA  []Aphasia   []Temporary blindness   []Dysphagia   []Weakness or numbness in arms   [x]Weakness or numbness in legs Musculoskeletal:  [x]Arthritis   []Joint swelling   []Joint pain   []Low back pain Hematologic:  []Easy bruising  []Easy bleeding   []Hypercoagulable state   []Anemic  []Hepatitis Gastrointestinal:  []Blood in stool   []Vomiting blood  []Gastroesophageal reflux/heartburn   []Abdominal pain Genitourinary:  []Chronic kidney disease   []Difficult urination  []Frequent urination  []Burning with urination   []Hematuria Skin:  []Rashes   []Ulcers   []Wounds Psychological:  []History of anxiety   [] History of major depression.    Physical Exam BP (!) 118/59 (BP Location: Right Arm, Patient Position: Sitting)   Pulse (!) 59   Resp 16   Ht 6' 2" (1.88 m)   Wt 200 lb (90.7 kg)   BMI 25.68 kg/m  Gen:  WD/WN, NAD.  Appears younger than stated age Head: [place]/AT, No temporalis wasting. Prominent temp pulse not noted. Ear/Nose/Throat: Hearing grossly intact, nares w/o erythema or drainage, oropharynx w/o Erythema/Exudate Eyes: Conjunctiva clear, sclera non-icteric  Neck: trachea midline.   Pulmonary:  Good air movement, respirations not labored, no use of accessory muscles Cardiac: RRR, no JVD Vascular:  Vessel Right Left  Radial Palpable Palpable                          PT Palpable  1+ palpable  DP Palpable Palpable    Musculoskeletal: M/S 5/5 throughout.  Extremities without ischemic changes.  No deformity or atrophy.  Trace lower extremity edema. Neurologic: Sensation grossly intact in extremities.  Symmetrical.  Speech is fluent. Motor exam as listed above. Psychiatric: Judgment intact, Mood & affect appropriate for pt's clinical  situation. Dermatologic: No rashes or ulcers noted.  No cellulitis or open wounds.  Radiology No results found.  Labs Recent Results (from the past 2160 hour(s))  Multiple Myeloma Panel (SPEP&IFE w/QIG)     Status: Abnormal   Collection Time: 06/30/17 12:05 PM  Result Value Ref  Range   IgG (Immunoglobin G), Serum 1,459 700 - 1,600 mg/dL   IgA 263 61 - 437 mg/dL   IgM (Immunoglobulin M), Srm 41 15 - 143 mg/dL   Total Protein ELP 6.9 6.0 - 8.5 g/dL   Albumin SerPl Elph-Mcnc 3.9 2.9 - 4.4 g/dL   Alpha 1 0.2 0.0 - 0.4 g/dL   Alpha2 Glob SerPl Elph-Mcnc 0.5 0.4 - 1.0 g/dL   B-Globulin SerPl Elph-Mcnc 1.0 0.7 - 1.3 g/dL   Gamma Glob SerPl Elph-Mcnc 1.3 0.4 - 1.8 g/dL   M Protein SerPl Elph-Mcnc 0.5 (H) Not Observed g/dL   Globulin, Total 3.0 2.2 - 3.9 g/dL   Albumin/Glob SerPl 1.4 0.7 - 1.7   IFE 1 Comment     Comment: (NOTE) Immunofixation shows IgG monoclonal protein with kappa light chain specificity. Please note that samples from patients receiving DARZALEX(R) (daratumumab) treatment can appear as an "IgG kappa" and mask a complete response. If this patient is receiving DARA, this IFE assay interference can be removed by ordering test number [id number]-"Immunofixation, Daratumumab-Specific, Serum" and submitting a new sample for testing or by calling the lab to add this test to the current sample.    Please Note Comment     Comment: (NOTE) Protein electrophoresis scan will follow via computer, mail, or courier delivery. Performed At: [facility] [place], [place] [id number] Rush Farmer MD [id number] Performed at [facility], [address] [place], [place] [address]   CBC with Differential/Platelet     Status: Abnormal   Collection Time: 06/30/17 12:10 PM  Result Value Ref Range   WBC 4.3 3.8 - 10.6 K/uL   RBC 4.18 (L) 4.40 - 5.90 MIL/uL   Hemoglobin 14.0 13.0 - 18.0 g/dL   HCT 40.5 40.0 - 52.0 %   MCV 96.8 80.0 - 100.0 fL   MCH  33.4 26.0 - 34.0 pg   MCHC 34.5 32.0 - 36.0 g/dL   RDW 13.7 11.5 - 14.5 %   Platelets 188 150 - 440 K/uL   Neutrophils Relative % 66 %   Neutro Abs 2.8 1.4 - 6.5 K/uL   Lymphocytes Relative 16 %   Lymphs Abs 0.7 (L) 1.0 - 3.6 K/uL   Monocytes Relative 11 %   Monocytes Absolute 0.5 0.2 - 1.0 K/uL   Eosinophils Relative 6 %   Eosinophils Absolute 0.3 0 - 0.7 K/uL   Basophils Relative 1 %   Basophils Absolute 0.0 0 - 0.1 K/uL    Comment: Performed at [facility], [address] [place], [place] [address]  Kappa/lambda light chains     Status: Abnormal   Collection Time: 06/30/17 12:10 PM  Result Value Ref Range   Kappa free light chain 20.6 (H) 3.3 - 19.4 mg/L   Lamda free light chains 13.1 5.7 - 26.3 mg/L   Kappa, lamda light chain ratio 1.57 0.26 - 1.65    Comment: (NOTE) Performed At: [facility] [place], [place] [id number] Rush Farmer MD [phone] Performed at [facility], [address] [place], [place] [address]   Comprehensive metabolic panel     Status: Abnormal   Collection Time: 06/30/17 12:10 PM  Result Value Ref Range   Sodium 136 135 - 145 mmol/L   Potassium 4.3 3.5 - 5.1 mmol/L   Chloride 106 101 - 111 mmol/L   CO2 23 22 - 32 mmol/L   Glucose, Bld 109 (H) 65 - 99 mg/dL   BUN 25 (H)  6 - 20 mg/dL   Creatinine, Ser 0.81 0.61 - 1.24 mg/dL   Calcium 9.0 8.9 - 10.3 mg/dL   Total Protein 7.4 6.5 - 8.1 g/dL   Albumin 3.9 3.5 - 5.0 g/dL   AST 43 (H) 15 - 41 U/L   ALT 34 17 - 63 U/L   Alkaline Phosphatase 77 38 - 126 U/L   Total Bilirubin 1.2 0.3 - 1.2 mg/dL   GFR calc non Af Amer >60 >60 mL/min   GFR calc Af Amer >60 >60 mL/min    Comment: (NOTE) The eGFR has been calculated using the CKD EPI equation. This calculation has not been validated in all clinical situations. eGFR's persistently <60 mL/min signify possible Chronic Kidney Disease.    Anion gap 7 5 - 15    Comment: Performed at [facility], [address] [place], [place] [address]    Assessment/Plan:  Hypertension blood pressure control important in reducing the progression of atherosclerotic disease. On appropriate oral medications.   Peripheral neuropathy (HCC) Although vascular disease is rarely a primary cause, it could be exacerbating his peripheral neuropathy.  Bilateral lower extremity edema Unclear the etiology.  Compression stockings and leg elevation.  Venous reflux study as below.  Pain in limb  Recommend:  The patient has atypical pain symptoms for pure atherosclerotic disease. However, on physical exam there is evidence of mixed venous and arterial disease, given the diminished pulses and the edema associated with venous changes of the legs.  Noninvasive studies including ABI's and venous ultrasound of the legs will be obtained and the patient will follow up with me to review these studies.  The patient should continue walking and begin a more formal exercise program. The patient should continue his antiplatelet therapy and aggressive treatment of the lipid abnormalities.  The patient should begin wearing graduated compression socks 15-20 mmHg strength to control edema.       Leotis Pain 07/06/2017, 3:35 PM   This note was created with Dragon medical transcription system.  Any errors from dictation are unintentional.

## 2017-07-06 NOTE — Assessment & Plan Note (Signed)
blood pressure control important in reducing the progression of atherosclerotic disease. On appropriate oral medications.  

## 2017-07-19 ENCOUNTER — Other Ambulatory Visit: Payer: Self-pay | Admitting: Oncology

## 2017-07-19 ENCOUNTER — Other Ambulatory Visit: Payer: Self-pay

## 2017-07-19 ENCOUNTER — Inpatient Hospital Stay: Payer: Medicare Other | Attending: Oncology | Admitting: Oncology

## 2017-07-19 ENCOUNTER — Encounter: Payer: Self-pay | Admitting: Oncology

## 2017-07-19 VITALS — BP 152/75 | HR 67 | Temp 97.8°F | Wt 194.2 lb

## 2017-07-19 DIAGNOSIS — D472 Monoclonal gammopathy: Secondary | ICD-10-CM | POA: Diagnosis not present

## 2017-07-19 DIAGNOSIS — G629 Polyneuropathy, unspecified: Secondary | ICD-10-CM | POA: Diagnosis not present

## 2017-07-19 NOTE — Progress Notes (Signed)
Patient here today for follow up.   

## 2017-07-19 NOTE — Progress Notes (Signed)
Hematology/Oncology follow up  note [facility] [phone] [phone]) [phone]   Patient Care Team: Olin Hauser, DO as PCP - General (Family Medicine) Renata Caprice as Physician Assistant (Orthopedic Surgery)  REFERRING PROVIDER: Dr.Shah  CHIEF COMPLAINTS/PURPOSE OF CONSULTATION:  Evaluation of elevated M spike.  HISTORY OF PRESENTING ILLNESS:  James Cardenas is a  77 y.o.  male with PMH listed below who was referred to me for evaluation of elevated M spike.  Patient follows up with [facility] Neurology Dr. Bobbye Riggs for evaluation of neuropathy.  Patient states that symptom has started in 2060 has slightly progressed.  Mostly focus on posterior lower extremity, feels like bilateral aching, soreness, tightness.  Intermittently he will also have numbness and tingling in his feet without burning and pain.  He has had extensive workup done including normal thyroid function, normal B12, ESR, ANA, rheumatoid factor, diabetes screening, heavy metal screening.Marland Kitchen  He has had SPEP and immunofixation which showed IgG monoclonal antibody with kappa light chain specificity, M spike 0.7 g/dL.  Patient denies any bone pain.  He has good energy level and remains very active.  No raynaud's syndrome   INTERVAL HISTORY James Cardenas is a 77 y.o. male who has above history reviewed by me today presents for follow up visit for management of elevated M spike. He has had lab work done last visit and presents to discuss lab results. He has no new complaints. Repeat SPEP showed monoclonal Spike at 0.5g/dl, IgG Kappa. Free light chain ration is normal.   Review of Systems  Constitutional: Negative for chills, fever, malaise/fatigue and weight loss.  HENT: Negative for ear discharge and ear pain.   Eyes: Negative for blurred vision, double vision and pain.  Respiratory: Negative for cough, hemoptysis and sputum production.   Cardiovascular: Negative for chest  pain and orthopnea.  Gastrointestinal: Negative for nausea and vomiting.  Genitourinary: Negative for dysuria and urgency.  Musculoskeletal: Negative for myalgias and neck pain.  Skin: Negative for rash.  Neurological: Negative for tingling, tremors and focal weakness.  Psychiatric/Behavioral: Negative for depression.    MEDICAL HISTORY:  Past Medical History:  Diagnosis Date  . Anxiety   . Arthritis of knee   . Hypertension   . Murmur, cardiac   . Neuropathy     SURGICAL HISTORY: Past Surgical History:  Procedure Laterality Date  . NECK SURGERY  2011    SOCIAL HISTORY: Social History   Socioeconomic History  . Marital status: Divorced    Spouse name: Not on file  . Number of children: Not on file  . Years of education: Not on file  . Highest education level: Not on file  Occupational History  . Occupation: Retired Designer, multimedia, Public affairs consultant / heat treating)  Social Needs  . Financial resource strain: Not on file  . Food insecurity:    Worry: Not on file    Inability: Not on file  . Transportation needs:    Medical: Not on file    Non-medical: Not on file  Tobacco Use  . Smoking status: Never Smoker  . Smokeless tobacco: Never Used  Substance and Sexual Activity  . Alcohol use: Yes    Alcohol/week: 4.8 oz    Types: 7 Glasses of wine, 1 Cans of beer per week  . Drug use: No  . Sexual activity: Yes  Lifestyle  . Physical activity:    Days per week: Not on file    Minutes per session: Not on  file  . Stress: Not on file  Relationships  . Social connections:    Talks on phone: Not on file    Gets together: Not on file    Attends religious service: Not on file    Active member of club or organization: Not on file    Attends meetings of clubs or organizations: Not on file    Relationship status: Not on file  . Intimate partner violence:    Fear of current or ex partner: Not on file    Emotionally abused: Not on file    Physically abused: Not on file     Forced sexual activity: Not on file  Other Topics Concern  . Not on file  Social History Narrative  . Not on file    FAMILY HISTORY: Family History  Problem Relation Age of Onset  . Thyroid disease Mother   . Lung cancer Father   . Pancreatic cancer Brother   . Prostate cancer Neg Hx   . Bladder Cancer Neg Hx   . Kidney cancer Neg Hx     ALLERGIES:  has No Known Allergies.  MEDICATIONS:  Current Outpatient Medications  Medication Sig Dispense Refill  . ALPRAZolam (XANAX) 0.5 MG tablet TAKE 1 TABLET BY MOUTH 2 TIMES A DAY AS NEEDED FOR ANXIETY 60 tablet 2  . busPIRone (BUSPAR) 5 MG tablet Take 1 tablet (5 mg total) by mouth 2 (two) times daily. Take every day 3-4 weeks, if preferred switch to only for situational anxiety (Patient not taking: Reported on 06/30/2017) 60 tablet 2  . clindamycin (CLINDAGEL) 1 % gel APPLY TWICE A DAY AS DIRECTED  3  . Cyanocobalamin (VITAMIN B 12) 100 MCG LOZG Take by mouth.    . docusate sodium (COLACE) 100 MG capsule Take 100 mg by mouth daily as needed for mild constipation.    Marland Kitchen FINACEA 15 % FOAM Apply AS directed TO FACE daily  6  . glucosamine-chondroitin 500-400 MG tablet Take 1 tablet by mouth 3 (three) times daily.    Marland Kitchen ibuprofen (ADVIL,MOTRIN) 200 MG tablet Take 200 mg by mouth every 6 (six) hours as needed.    Marland Kitchen losartan (COZAAR) 100 MG tablet Take 1 tablet (100 mg total) by mouth daily. 90 tablet 3  . Menthol, Topical Analgesic, (BLUE-EMU MAXIMUM STRENGTH EX) Apply topically.    . mirabegron ER (MYRBETRIQ) 50 MG TB24 tablet Take 1 tablet (50 mg total) by mouth daily. 90 tablet 1  . Multiple Vitamin (MULTI-VITAMINS) TABS Take by mouth.    . Omega-3 1000 MG CAPS Take by mouth.    . Polyethylene Glycol 3350 (MIRALAX PO) Take by mouth as needed.    . sildenafil (REVATIO) 20 MG tablet Take 1 to 5 tabs PO daily prn 50 tablet 6  . tretinoin (RETIN-A) 0.1 % cream APPLY A PEA SIZE AMOUNT TO FACE NIGHTLY  0  . triamcinolone cream (KENALOG) 0.1 %  APPLY TO AFFECTED AREAS OF LEGS AND CHEST TWICE A DAY AS NEEDED 2WEEK ON 2 WEEKS OFF  0  . Vitamin D, Ergocalciferol, (DRISDOL) 50000 units CAPS capsule Take by mouth.     No current facility-administered medications for this visit.      PHYSICAL EXAMINATION: ECOG PERFORMANCE STATUS: 0 - Asymptomatic Vitals:   07/19/17 1450  BP: (!) 152/75  Pulse: 67  Temp: 97.8 F (36.6 C)   Filed Weights   07/19/17 1450  Weight: 194 lb 4 oz (88.1 kg)    Physical Exam  Constitutional: He  is oriented to person, place, and time. No distress.  HENT:  Head: Normocephalic and atraumatic.  Mouth/Throat: No oropharyngeal exudate.  Eyes: Pupils are equal, round, and reactive to light. Conjunctivae and EOM are normal. No scleral icterus.  Neck: Normal range of motion. Neck supple. No JVD present.  Cardiovascular:  Murmur heard. Pulmonary/Chest: Effort normal and breath sounds normal. No respiratory distress. He has no wheezes.  Abdominal: Soft. Bowel sounds are normal. He exhibits no distension. There is no rebound and no guarding.  Musculoskeletal: Normal range of motion. He exhibits no edema.  Lymphadenopathy:    He has no cervical adenopathy.  Neurological: He is oriented to person, place, and time. Coordination normal.  Skin: Skin is warm. No rash noted. No erythema.  Psychiatric: Memory, affect and judgment normal.     LABORATORY DATA:  I have reviewed the data as listed Lab Results  Component Value Date   WBC 4.3 06/30/2017   HGB 14.0 06/30/2017   HCT 40.5 06/30/2017   MCV 96.8 06/30/2017   PLT 188 06/30/2017   Recent Labs    06/30/17 1210  NA 136  K 4.3  CL 106  CO2 23  GLUCOSE 109*  BUN 25*  CREATININE 0.81  CALCIUM 9.0  GFRNONAA >60  GFRAA >60  PROT 7.4  ALBUMIN 3.9  AST 43*  ALT 34  ALKPHOS 77  BILITOT 1.2       ASSESSMENT & PLAN:  1. MGUS (monoclonal gammopathy of unknown significance)   2. Neuropathy    Lab work up discussed with patient, he has IgG  Kappa MGUS, and this is a pre myeloma state. Each year a small portion of patient progressed to active multiple myeloma, roughly 1% per year.  MGUS not explaining his neuropathy, continue follow up with neurology for evaluation and mangemant.   For his MGUS, I recommend surveillance and follow up with [place] every 6 months. With repeat labs.   All questions were answered. The patient knows to call the clinic with any problems questions or concerns.  Return of visit: 6 months.  Thank you for this kind referral and the opportunity to participate in the care of this patient. A copy of today's note is routed to referring provider    Earlie Server, MD, PhD Hematology Oncology [facility] Pager- [phone] 07/19/2017

## 2017-07-27 ENCOUNTER — Encounter (INDEPENDENT_AMBULATORY_CARE_PROVIDER_SITE_OTHER): Payer: Self-pay | Admitting: Vascular Surgery

## 2017-07-27 ENCOUNTER — Ambulatory Visit (INDEPENDENT_AMBULATORY_CARE_PROVIDER_SITE_OTHER): Payer: Medicare Other

## 2017-07-27 ENCOUNTER — Ambulatory Visit (INDEPENDENT_AMBULATORY_CARE_PROVIDER_SITE_OTHER): Payer: Medicare Other | Admitting: Vascular Surgery

## 2017-07-27 VITALS — BP 138/74 | HR 57 | Resp 14 | Ht 75.0 in | Wt 196.0 lb

## 2017-07-27 DIAGNOSIS — R600 Localized edema: Secondary | ICD-10-CM | POA: Diagnosis not present

## 2017-07-27 DIAGNOSIS — M79604 Pain in right leg: Secondary | ICD-10-CM

## 2017-07-27 DIAGNOSIS — G609 Hereditary and idiopathic neuropathy, unspecified: Secondary | ICD-10-CM | POA: Diagnosis not present

## 2017-07-27 DIAGNOSIS — M79605 Pain in left leg: Secondary | ICD-10-CM

## 2017-07-27 DIAGNOSIS — I10 Essential (primary) hypertension: Secondary | ICD-10-CM | POA: Diagnosis not present

## 2017-07-27 NOTE — Assessment & Plan Note (Signed)
His noninvasive studies today show completely normal arterial waveforms with elevated pressures which may be due to noncompressible vessels, but no evidence of significant arterial insufficiency in either lower extremity.  Venous duplex reveals no evidence of deep venous thrombosis or superficial thrombophlebitis in either lower extremity.  There was some deep venous reflux and reflux at the saphenofemoral junction on the right but no significant superficial venous reflux was identified. It does not appear in his case that there is any evidence of significant vascular disease contributing to his lower extremity symptoms.  I will see him back on an as-needed basis.

## 2017-07-27 NOTE — Progress Notes (Signed)
MRN : [id number]  James Cardenas is a 77 y.o. (July 20, 1940) male who presents with chief complaint of  Chief Complaint  Patient presents with  . Follow-up    ABI and Reflux u/s f/u  .  History of Present Illness: Patient returns today in follow up of lower extremity pain and neuropathy.  No major changes from his last visit a few weeks ago.  His noninvasive studies today show completely normal arterial waveforms with elevated pressures which may be due to noncompressible vessels, but no evidence of significant arterial insufficiency in either lower extremity.  Venous duplex reveals no evidence of deep venous thrombosis or superficial thrombophlebitis in either lower extremity.  There was some deep venous reflux and reflux at the saphenofemoral junction on the right but no significant superficial venous reflux was identified.  Past Medical History:  Diagnosis Date  . Anxiety   . Arthritis of knee   . Hypertension   . Murmur, cardiac   . Neuropathy          Past Surgical History:  Procedure Laterality Date  . NECK SURGERY  2011         Family History  Problem Relation Age of Onset  . Thyroid disease Mother   . Lung cancer Father   . Pancreatic cancer Brother   . Prostate cancer Neg Hx   . Bladder Cancer Neg Hx   . Kidney cancer Neg Hx     Social History Social History        Tobacco Use  . Smoking status: Never Smoker  . Smokeless tobacco: Never Used  Substance Use Topics  . Alcohol use: Yes    Alcohol/week: 4.8 oz    Types: 7 Glasses of wine, 1 Cans of beer per week  . Drug use: No    No Known Allergies        Current Outpatient Medications  Medication Sig Dispense Refill  . ALPRAZolam (XANAX) 0.5 MG tablet TAKE 1 TABLET BY MOUTH 2 TIMES A DAY AS NEEDED FOR ANXIETY 60 tablet 2  . busPIRone (BUSPAR) 5 MG tablet Take 1 tablet (5 mg total) by mouth 2 (two) times daily. Take every day 3-4 weeks, if preferred switch to only for  situational anxiety (Patient not taking: Reported on 06/30/2017) 60 tablet 2  . clindamycin (CLINDAGEL) 1 % gel APPLY TWICE A DAY AS DIRECTED  3  . Cyanocobalamin (VITAMIN B 12) 100 MCG LOZG Take by mouth.    . docusate sodium (COLACE) 100 MG capsule Take 100 mg by mouth daily as needed for mild constipation.    Marland Kitchen FINACEA 15 % FOAM Apply AS directed TO FACE daily  6  . glucosamine-chondroitin 500-400 MG tablet Take 1 tablet by mouth 3 (three) times daily.    Marland Kitchen ibuprofen (ADVIL,MOTRIN) 200 MG tablet Take 200 mg by mouth every 6 (six) hours as needed.    Marland Kitchen losartan (COZAAR) 100 MG tablet Take 1 tablet (100 mg total) by mouth daily. 90 tablet 3  . Menthol, Topical Analgesic, (BLUE-EMU MAXIMUM STRENGTH EX) Apply topically.    . mirabegron ER (MYRBETRIQ) 50 MG TB24 tablet Take 1 tablet (50 mg total) by mouth daily. 90 tablet 1  . Multiple Vitamin (MULTI-VITAMINS) TABS Take by mouth.    . Omega-3 1000 MG CAPS Take by mouth.    . Polyethylene Glycol 3350 (MIRALAX PO) Take by mouth as needed.    . sildenafil (REVATIO) 20 MG tablet Take 1 to 5 tabs PO daily  prn 50 tablet 6  . tretinoin (RETIN-A) 0.1 % cream APPLY A PEA SIZE AMOUNT TO FACE NIGHTLY  0  . triamcinolone cream (KENALOG) 0.1 % APPLY TO AFFECTED AREAS OF LEGS AND CHEST TWICE A DAY AS NEEDED 2WEEK ON 2 WEEKS OFF  0  . Vitamin D, Ergocalciferol, (DRISDOL) 50000 units CAPS capsule Take by mouth.     No current facility-administered medications for this visit.       REVIEW OF SYSTEMS (Negative unless checked)  Constitutional: '[]' Weight loss  '[]' Fever  '[]' Chills Cardiac: '[]' Chest pain   '[]' Chest pressure   '[]' Palpitations   '[]' Shortness of breath when laying flat   '[]' Shortness of breath at rest   '[]' Shortness of breath with exertion. Vascular:  '[]' Pain in legs with walking   '[]' Pain in legs at rest   '[]' Pain in legs when laying flat   '[]' Claudication   '[]' Pain in feet when walking  '[]' Pain in feet at rest  '[]' Pain in feet when  laying flat   '[]' History of DVT   '[]' Phlebitis   '[]' Swelling in legs   '[]' Varicose veins   '[]' Non-healing ulcers Pulmonary:   '[]' Uses home oxygen   '[]' Productive cough   '[]' Hemoptysis   '[]' Wheeze  '[]' COPD   '[]' Asthma Neurologic:  '[]' Dizziness  '[]' Blackouts   '[]' Seizures   '[]' History of stroke   '[]' History of TIA  '[]' Aphasia   '[]' Temporary blindness   '[]' Dysphagia   '[]' Weakness or numbness in arms   '[x]' Weakness or numbness in legs Musculoskeletal:  '[x]' Arthritis   '[]' Joint swelling   '[]' Joint pain   '[]' Low back pain Hematologic:  '[]' Easy bruising  '[]' Easy bleeding   '[]' Hypercoagulable state   '[]' Anemic  '[]' Hepatitis Gastrointestinal:  '[]' Blood in stool   '[]' Vomiting blood  '[]' Gastroesophageal reflux/heartburn   '[]' Abdominal pain Genitourinary:  '[]' Chronic kidney disease   '[]' Difficult urination  '[]' Frequent urination  '[]' Burning with urination   '[]' Hematuria Skin:  '[]' Rashes   '[]' Ulcers   '[]' Wounds Psychological:  '[]' History of anxiety   '[]'  History of major depression.    Physical Examination  BP 138/74 (BP Location: Right Arm, Patient Position: Sitting)   Pulse (!) 57   Resp 14   Ht '6\' 3"'  (1.905 m)   Wt 88.9 kg (196 lb)   BMI 24.50 kg/m  Gen:  WD/WN, NAD.  Appears younger than stated age Head: [place]/AT, No temporalis wasting. Ear/Nose/Throat: Hearing grossly intact, nares w/o erythema or drainage Eyes: Conjunctiva clear. Sclera non-icteric Neck: Supple.  Trachea midline Pulmonary:  Good air movement, no use of accessory muscles.  Cardiac: RRR, no JVD Vascular:  Vessel Right Left  Radial Palpable Palpable                          PT Palpable  1+ palpable  DP Palpable Palpable    Musculoskeletal: M/S 5/5 throughout.  No deformity or atrophy.  No significant lower extremity edema today. Neurologic: Sensation grossly intact in extremities.  Symmetrical.  Speech is fluent.  Psychiatric: Judgment intact, Mood & affect appropriate for pt's clinical situation. Dermatologic: No rashes or ulcers noted.  No cellulitis or open  wounds.       Labs Recent Results (from the past 2160 hour(s))  Multiple Myeloma Panel (SPEP&IFE w/QIG)     Status: Abnormal   Collection Time: 06/30/17 12:05 PM  Result Value Ref Range   IgG (Immunoglobin G), Serum 1,459 700 - 1,600 mg/dL   IgA 263 61 - 437 mg/dL   IgM (Immunoglobulin M), Srm 41 15 - 143  mg/dL   Total Protein ELP 6.9 6.0 - 8.5 g/dL   Albumin SerPl Elph-Mcnc 3.9 2.9 - 4.4 g/dL   Alpha 1 0.2 0.0 - 0.4 g/dL   Alpha2 Glob SerPl Elph-Mcnc 0.5 0.4 - 1.0 g/dL   B-Globulin SerPl Elph-Mcnc 1.0 0.7 - 1.3 g/dL   Gamma Glob SerPl Elph-Mcnc 1.3 0.4 - 1.8 g/dL   M Protein SerPl Elph-Mcnc 0.5 (H) Not Observed g/dL   Globulin, Total 3.0 2.2 - 3.9 g/dL   Albumin/Glob SerPl 1.4 0.7 - 1.7   IFE 1 Comment     Comment: (NOTE) Immunofixation shows IgG monoclonal protein with kappa light chain specificity. Please note that samples from patients receiving DARZALEX(R) (daratumumab) treatment can appear as an "IgG kappa" and mask a complete response. If this patient is receiving DARA, this IFE assay interference can be removed by ordering test number [id number]-"Immunofixation, Daratumumab-Specific, Serum" and submitting a new sample for testing or by calling the lab to add this test to the current sample.    Please Note Comment     Comment: (NOTE) Protein electrophoresis scan will follow via computer, mail, or courier delivery. Performed At: [facility] [place], [place] [id number] Rush Farmer MD [id number] Performed at [facility], [address] [place], [place] [address]   CBC with Differential/Platelet     Status: Abnormal   Collection Time: 06/30/17 12:10 PM  Result Value Ref Range   WBC 4.3 3.8 - 10.6 K/uL   RBC 4.18 (L) 4.40 - 5.90 MIL/uL   Hemoglobin 14.0 13.0 - 18.0 g/dL   HCT 40.5 40.0 - 52.0 %   MCV 96.8 80.0 - 100.0 fL   MCH 33.4 26.0 - 34.0 pg   MCHC 34.5 32.0 - 36.0 g/dL   RDW 13.7 11.5 - 14.5 %   Platelets 188 150 -  440 K/uL   Neutrophils Relative % 66 %   Neutro Abs 2.8 1.4 - 6.5 K/uL   Lymphocytes Relative 16 %   Lymphs Abs 0.7 (L) 1.0 - 3.6 K/uL   Monocytes Relative 11 %   Monocytes Absolute 0.5 0.2 - 1.0 K/uL   Eosinophils Relative 6 %   Eosinophils Absolute 0.3 0 - 0.7 K/uL   Basophils Relative 1 %   Basophils Absolute 0.0 0 - 0.1 K/uL    Comment: Performed at [facility], [address] [place], [place] [address]  Kappa/lambda light chains     Status: Abnormal   Collection Time: 06/30/17 12:10 PM  Result Value Ref Range   Kappa free light chain 20.6 (H) 3.3 - 19.4 mg/L   Lamda free light chains 13.1 5.7 - 26.3 mg/L   Kappa, lamda light chain ratio 1.57 0.26 - 1.65    Comment: (NOTE) Performed At: [facility] [place], [place] [id number] Rush Farmer MD [phone] Performed at [facility], [address] [place], [place] [address]   Comprehensive metabolic panel     Status: Abnormal   Collection Time: 06/30/17 12:10 PM  Result Value Ref Range   Sodium 136 135 - 145 mmol/L   Potassium 4.3 3.5 - 5.1 mmol/L   Chloride 106 101 - 111 mmol/L   CO2 23 22 - 32 mmol/L   Glucose, Bld 109 (H) 65 - 99 mg/dL   BUN 25 (H) 6 - 20 mg/dL   Creatinine, Ser 0.81 0.61 - 1.24 mg/dL   Calcium 9.0 8.9 - 10.3 mg/dL   Total Protein 7.4 6.5 - 8.1 g/dL  Albumin 3.9 3.5 - 5.0 g/dL   AST 43 (H) 15 - 41 U/L   ALT 34 17 - 63 U/L   Alkaline Phosphatase 77 38 - 126 U/L   Total Bilirubin 1.2 0.3 - 1.2 mg/dL   GFR calc non Af Amer >60 >60 mL/min   GFR calc Af Amer >60 >60 mL/min    Comment: (NOTE) The eGFR has been calculated using the CKD EPI equation. This calculation has not been validated in all clinical situations. eGFR's persistently <60 mL/min signify possible Chronic Kidney Disease.    Anion gap 7 5 - 15    Comment: Performed at [facility], [address], [place], [place] [address]    Radiology No results  found.  Assessment/Plan Hypertension blood pressure control important in reducing the progression of atherosclerotic disease. On appropriate oral medications.   Peripheral neuropathy (HCC) Although vascular disease is rarely a primary cause, it could be exacerbating his peripheral neuropathy.  In his case, with his reasonably normal studies I do not think that is likely.   Pain in limb His noninvasive studies today show completely normal arterial waveforms with elevated pressures which may be due to noncompressible vessels, but no evidence of significant arterial insufficiency in either lower extremity.  Venous duplex reveals no evidence of deep venous thrombosis or superficial thrombophlebitis in either lower extremity.  There was some deep venous reflux and reflux at the saphenofemoral junction on the right but no significant superficial venous reflux was identified. It does not appear in his case that there is any evidence of significant vascular disease contributing to his lower extremity symptoms.  I will see him back on an as-needed basis.    Leotis Pain, MD  07/27/2017 3:50 PM    This note was created with Dragon medical transcription system.  Any errors from dictation are purely unintentional

## 2017-07-29 ENCOUNTER — Ambulatory Visit (INDEPENDENT_AMBULATORY_CARE_PROVIDER_SITE_OTHER): Payer: Medicare Other | Admitting: Podiatry

## 2017-07-29 ENCOUNTER — Encounter: Payer: Self-pay | Admitting: Podiatry

## 2017-07-29 DIAGNOSIS — B351 Tinea unguium: Secondary | ICD-10-CM | POA: Diagnosis not present

## 2017-07-29 DIAGNOSIS — M79676 Pain in unspecified toe(s): Secondary | ICD-10-CM

## 2017-07-29 NOTE — Progress Notes (Signed)
Complaint:  Visit Type: Patient returns to my office for continued preventative foot care services. Complaint: Patient states" my nails have grown long and thick and become painful to walk and wear shoes" Patient has been previously treated for clavi/ulcer third toe left foot which has healed. The patient presents for preventative foot care services. No changes to ROS  Podiatric Exam: Vascular: dorsalis pedis and posterior tibial pulses are palpable bilateral. Capillary return is immediate. Temperature gradient is WNL. Skin turgor WNL  Sensorium: Normal Semmes Weinstein monofilament test. Normal tactile sensation bilaterally. Nail Exam: Pt has thick disfigured discolored nails with subungual debris noted bilateral entire nail hallux through fifth toenails Ulcer Exam: There is no evidence of ulcer or pre-ulcerative changes or infection. Orthopedic Exam: Muscle tone and strength are WNL. No limitations in general ROM. No crepitus or effusions noted. Foot type and digits show no abnormalities. Bony prominences are unremarkable. Skin: No Porokeratosis. No infection or ulcers.  Healing ulcer 3rd toe left foot.  Diagnosis:  Onychomycosis, , Pain in right toe, pain in left toes  Treatment & Plan Procedures and Treatment: Consent by patient was obtained for treatment procedures.   Debridement of mycotic and hypertrophic toenails, 1 through 5 bilateral and clearing of subungual debris. No ulceration, no infection noted.  Return Visit-Office Procedure: Patient instructed to return to the office for a follow up visit 3 months for continued evaluation and treatment.    Gardiner Barefoot DPM

## 2017-08-02 ENCOUNTER — Ambulatory Visit: Payer: Medicare Other | Admitting: Podiatry

## 2017-08-10 DIAGNOSIS — D485 Neoplasm of uncertain behavior of skin: Secondary | ICD-10-CM | POA: Diagnosis not present

## 2017-08-10 DIAGNOSIS — L570 Actinic keratosis: Secondary | ICD-10-CM | POA: Diagnosis not present

## 2017-08-10 DIAGNOSIS — M720 Palmar fascial fibromatosis [Dupuytren]: Secondary | ICD-10-CM | POA: Diagnosis not present

## 2017-08-10 DIAGNOSIS — L300 Nummular dermatitis: Secondary | ICD-10-CM | POA: Diagnosis not present

## 2017-08-10 DIAGNOSIS — K130 Diseases of lips: Secondary | ICD-10-CM | POA: Diagnosis not present

## 2017-08-10 DIAGNOSIS — C44319 Basal cell carcinoma of skin of other parts of face: Secondary | ICD-10-CM | POA: Diagnosis not present

## 2017-08-10 DIAGNOSIS — F411 Generalized anxiety disorder: Secondary | ICD-10-CM | POA: Diagnosis not present

## 2017-08-10 DIAGNOSIS — D472 Monoclonal gammopathy: Secondary | ICD-10-CM | POA: Diagnosis not present

## 2017-08-10 DIAGNOSIS — G600 Hereditary motor and sensory neuropathy: Secondary | ICD-10-CM | POA: Diagnosis not present

## 2017-08-10 DIAGNOSIS — X32XXXA Exposure to sunlight, initial encounter: Secondary | ICD-10-CM | POA: Diagnosis not present

## 2017-08-10 DIAGNOSIS — E559 Vitamin D deficiency, unspecified: Secondary | ICD-10-CM | POA: Diagnosis not present

## 2017-08-13 ENCOUNTER — Other Ambulatory Visit: Payer: Self-pay | Admitting: Family Medicine

## 2017-08-13 DIAGNOSIS — I10 Essential (primary) hypertension: Secondary | ICD-10-CM

## 2017-10-04 ENCOUNTER — Encounter: Payer: Self-pay | Admitting: Podiatry

## 2017-10-04 ENCOUNTER — Ambulatory Visit (INDEPENDENT_AMBULATORY_CARE_PROVIDER_SITE_OTHER): Payer: Medicare Other | Admitting: Podiatry

## 2017-10-04 DIAGNOSIS — B351 Tinea unguium: Secondary | ICD-10-CM

## 2017-10-04 DIAGNOSIS — M79676 Pain in unspecified toe(s): Secondary | ICD-10-CM | POA: Diagnosis not present

## 2017-10-04 DIAGNOSIS — M2041 Other hammer toe(s) (acquired), right foot: Secondary | ICD-10-CM

## 2017-10-04 DIAGNOSIS — M2042 Other hammer toe(s) (acquired), left foot: Secondary | ICD-10-CM

## 2017-10-04 NOTE — Progress Notes (Signed)
Complaint:  Visit Type: Patient returns to my office for continued preventative foot care services. Complaint: Patient states" my nails have grown long and thick and become painful to walk and wear shoes" Patient has been previously treated for clavi/ulcer third toe left foot which has healed. The patient presents for preventative foot care services. No changes to ROS  Podiatric Exam: Vascular: dorsalis pedis and posterior tibial pulses are palpable bilateral. Capillary return is immediate. Temperature gradient is WNL. Skin turgor WNL  Sensorium: Normal Semmes Weinstein monofilament test. Normal tactile sensation bilaterally. Nail Exam: Pt has thick disfigured discolored nails with subungual debris noted bilateral entire nail hallux through fifth toenails Ulcer Exam: There is no evidence of ulcer or pre-ulcerative changes or infection. Orthopedic Exam: Muscle tone and strength are WNL. No limitations in general ROM. No crepitus or effusions noted. Foot type and digits show no abnormalities. Hallux malleus  B/L  Hammer toes  B/l. Skin: No Porokeratosis. No infection or ulcers.    Diagnosis:  Onychomycosis, , Pain in right toe, pain in left toes  Treatment & Plan Procedures and Treatment: Consent by patient was obtained for treatment procedures.   Debridement of mycotic and hypertrophic toenails, 1 through 5 bilateral and clearing of subungual debris. No ulceration, no infection noted.  Return Visit-Office Procedure: Patient instructed to return to the office for a follow up visit 3 months for continued evaluation and treatment.    Gardiner Barefoot DPM

## 2017-10-05 DIAGNOSIS — C44319 Basal cell carcinoma of skin of other parts of face: Secondary | ICD-10-CM | POA: Diagnosis not present

## 2017-10-12 ENCOUNTER — Encounter: Payer: Self-pay | Admitting: Family Medicine

## 2017-10-12 ENCOUNTER — Ambulatory Visit (INDEPENDENT_AMBULATORY_CARE_PROVIDER_SITE_OTHER): Payer: Medicare Other | Admitting: Family Medicine

## 2017-10-12 VITALS — BP 156/72 | HR 61 | Temp 98.4°F | Resp 16 | Ht 75.0 in | Wt 196.0 lb

## 2017-10-12 DIAGNOSIS — F418 Other specified anxiety disorders: Secondary | ICD-10-CM

## 2017-10-12 DIAGNOSIS — R011 Cardiac murmur, unspecified: Secondary | ICD-10-CM | POA: Diagnosis not present

## 2017-10-12 DIAGNOSIS — M720 Palmar fascial fibromatosis [Dupuytren]: Secondary | ICD-10-CM

## 2017-10-12 DIAGNOSIS — D472 Monoclonal gammopathy: Secondary | ICD-10-CM | POA: Diagnosis not present

## 2017-10-12 DIAGNOSIS — R7309 Other abnormal glucose: Secondary | ICD-10-CM | POA: Diagnosis not present

## 2017-10-12 DIAGNOSIS — I10 Essential (primary) hypertension: Secondary | ICD-10-CM | POA: Diagnosis not present

## 2017-10-12 DIAGNOSIS — N529 Male erectile dysfunction, unspecified: Secondary | ICD-10-CM | POA: Diagnosis not present

## 2017-10-12 DIAGNOSIS — I350 Nonrheumatic aortic (valve) stenosis: Secondary | ICD-10-CM | POA: Diagnosis not present

## 2017-10-12 DIAGNOSIS — G609 Hereditary and idiopathic neuropathy, unspecified: Secondary | ICD-10-CM

## 2017-10-12 MED ORDER — SILDENAFIL CITRATE 20 MG PO TABS: ORAL_TABLET | ORAL | 8 refills | Status: DC

## 2017-10-12 MED ORDER — ALPRAZOLAM 0.5 MG PO TABS: ORAL_TABLET | ORAL | 2 refills | Status: DC

## 2017-10-12 NOTE — Patient Instructions (Addendum)
Thank you for coming to the office today.  Keep up good work with following Neurology for further neuropathy testing, I agree with testing so far.  Refilled Alprazolam directly to [facility]  Rx refilled Sildenafil as needed  They will call you with E[facility]cardiogram at the [facility] - stay tuned for results, and then we may determine if need Cardiology input - this is for Aortic Valve, calcification or stenosis   A1c sugar check today - stay tuned for mychart result    Please schedule a Follow-up Appointment to: Return in about 6 months (around 04/14/2018) for Updates neuropathy, ECHO/aortic stenosis, anxiety, f/u Oncology.  If you have any other questions or concerns, please feel free to call the office or send a message through [place]. You may also schedule an earlier appointment if necessary.  Additionally, you may be receiving a survey about your experience at our office within a few days to 1 week by e-mail or mail. We value your feedback.  [name], DO [place]

## 2017-10-12 NOTE — Assessment & Plan Note (Signed)
Suspected idiopathic vs hereditary etiology Followed by [facility] Neurology Rufina Falco NP Has had extensive work-up EMG, heavy metal, A1c, B12, TSH, other labs, SPEP/UPEP, vascular studies - Failed SNRI Duloxetine (side effects), Lexapro, supplements  Plan: 1. Continue current plan per Neurology with Alpha Lipoic Acid, and monitoring and f/u in 6 months - Re-advised him that may not be able to give exact diagnosis, but our goal was to exclude other diagnoses

## 2017-10-12 NOTE — Assessment & Plan Note (Signed)
Incidentally diagnosed with work-up for neuropathy Followed by [facility] Dr Tasia Catchings Agree with surveillance q 6 months, patient to continue labs through them

## 2017-10-12 NOTE — Progress Notes (Signed)
Subjective:    Patient ID: James Cardenas, male    DOB: [birth year], 77 y.o.   MRN: [id number]  James Cardenas is a 77 y.o. male presenting on 10/12/2017 for Peripheral Neuropathy and Anxiety   HPI   FOLLOW-UPChronic Anxiety, Situational: - Last visit with me 04/16/17, for same problem, treated with previous failures of duloxetine and lexapro, and then switched from SNRI/SSRI to Buspar trial, see prior notes for background information. - Interval update with again he has intolerance on chronic med for anxiety, he had side effects felt "loopy and in [employer]" while on Buspar, tried 51m BID then down to daily and it was still not helping prevent his acute situational anxiety - Today patient reports he remains off Buspar and asking about alternatives or if he can just remain on Alprazolam as back up plan. Similar to previous discussions, he only takes Alprazolam (Xanax) 0.564mBID PRN only situational anxiety, often he can go weeks without taking it, but certain circumstances, doctors visit dentist visit and certain trips he may need it. He was given prior rx 60 pills with 2 refills has lasted 1 year approximately. He describes panic type symptoms if this flares up. - He denies ever having any withdrawal from these medications before - Denies any depression or sad mood  Peripheral Neuropathy, Chronic Previously referred by me to neurology 04/2017, [facility], last visit 08/10/17, interval updates with s/p EMG studies 06/2017 showed abnormal reading consistent with generalized severe sensorimotor polyneuropathy, has taken Alpha lipoic acid with improvement mildly, he was referred to Vascular Dr DeLucky Cowboyor ABI and venous [place] was essentially negative for any significant vascular disease. Of note other testing for heavy metals for neurotoxicity was initial mild abnormal, upon stopping fish oil repeat was normal. - Today he reports no new concerns, still has  persistent neuropathy. Taking Alpha Lipoic acid, some improvement. He is just observing it's course now, and will follow-up with Neuro, they think it is more hereditary or idiopathic problem at this time. - Admits some intermittent episodes of pain and numbness with neuropathy - Denies injury,trauma redness rash  MGUS Other blood work showed mild elevated M protein spike 0.7 on protein electrophoresis SPEP, referred to Oncology, [facility] YuTasia Catchingsrepeat SPEP showed 0.5 and discussion was that this is a type of "Pre-myeloma state" with IgG Kappa MGUS, and rarely does this advance to progress to Multiple Myeloma. They will follow-up with labs in future, with surveillance q 6 months for now.  Aortic Stenosis / regurgitation, Moderate / Heart Murmur Chronic problem, he reports prior heart murmur for years, was told that this has not changed much, and no new concerns. He is asking about update on this. [facility] FaUbaldo Glassinghowed moderate aortic stenosis, tricuspid valve and also moderate aortic regurg with preserved LVEF >55% Denies shortness of breath, chest pain, dizziness, near syncope  HTN Home readings normal 110-120/60-70s, and elevated acutely in office. He attributes to anxiety, situational Taking Losartan 10013maily Admits rarely episode of dizziness  Vitamin D Deficiency Previous lab work-up with Vitamin D was low at 27.5 in 05/2017, he was given rx Vitamin D3 50k weekly for 6 weeks, and then advised to reduce dose to Vitamin D3 600iu daily maintenance, he has not taken this.  Dupuytren's Flexion Contracture, Left hand 4th finger [facility], has been advised  that he may monitor this problem and if worsening contracture of tendon then may require surgical fix.  Additional complaint: Constipation - he still takes medicines including colace regularly and sometimes either sennokot or miralax with better  results   Health Maintenance:  Colon CA Screening: last colonoscopy 2011 - reported normal, no polyps, advised 10 yr next, interested in cologuard in future - he agrees reconsider Cologuard by year 2020.  Prostate CA Screening: Followed by Urology   Depression screen [facility] 2/9 10/12/2017 04/16/2017 10/06/2016  Decreased Interest 0 0 0  Down, Depressed, Hopeless 0 0 0  PHQ - 2 Score 0 0 0  Altered sleeping - 0 -  Tired, decreased energy - 0 -  Change in appetite - 0 -  Feeling bad or failure about yourself  - 0 -  Trouble concentrating - 0 -  Moving slowly or fidgety/restless - 0 -  Suicidal thoughts - 0 -  PHQ-9 Score - 0 -  Difficult doing work/chores - Not difficult at all -   GAD 7 : Generalized Anxiety Score 04/16/2017 10/06/2016 07/02/2016  Nervous, Anxious, on Edge 0 1 1  Control/stop worrying 0 0 0  Worry too much - different things 0 0 0  Trouble relaxing 0 0 0  Restless 0 0 0  Easily annoyed or irritable - 0 0  Afraid - awful might happen 0 0 0  Total GAD 7 Score - 1 1  Anxiety Difficulty Not difficult at all Not difficult at all Not difficult at all    Past Medical History:  Diagnosis Date  . Anxiety   . Arthritis of knee   . Hypertension   . Murmur, cardiac   . Neuropathy    Past Surgical History:  Procedure Laterality Date  . NECK SURGERY  2011   Social History   Socioeconomic History  . Marital status: Divorced    Spouse name: Not on file  . Number of children: Not on file  . Years of education: Not on file  . Highest education level: Not on file  Occupational History  . Occupation: Retired Designer, multimedia, Public affairs consultant / heat treating)  Social Needs  . Financial resource strain: Not on file  . Food insecurity:    Worry: Not on file    Inability: Not on file  . Transportation needs:    Medical: Not on file    Non-medical: Not on file  Tobacco Use  . Smoking status: Never Smoker  . Smokeless tobacco: Never Used  Substance and Sexual Activity  .  Alcohol use: Yes    Alcohol/week: 4.8 oz    Types: 7 Glasses of wine, 1 Cans of beer per week  . Drug use: No  . Sexual activity: Yes  Lifestyle  . Physical activity:    Days per week: Not on file    Minutes per session: Not on file  . Stress: Not on file  Relationships  . Social connections:    Talks on phone: Not on file    Gets together: Not on file    Attends religious service: Not on file    Active member of club or organization: Not on file    Attends meetings of clubs or organizations: Not on file    Relationship status: Not on file  . Intimate partner violence:    Fear of current or ex partner: Not on file    Emotionally abused: Not on file    Physically abused: Not on file  Forced sexual activity: Not on file  Other Topics Concern  . Not on file  Social History Narrative  . Not on file   Family History  Problem Relation Age of Onset  . Thyroid disease Mother   . Lung cancer Father   . Pancreatic cancer Brother   . Prostate cancer Neg Hx   . Bladder Cancer Neg Hx   . Kidney cancer Neg Hx    Current Outpatient Medications on File Prior to Visit  Medication Sig  . Acetylcarn-Alpha Lipoic Acid 400-200 MG CAPS Take by mouth.  Marland Kitchen aspirin EC 81 MG tablet Take 81 mg by mouth daily.  . Calcium Carbonate-Vitamin D 600-200 MG-UNIT TABS Take by mouth.  . clindamycin (CLINDAGEL) 1 % gel APPLY TWICE A DAY AS DIRECTED  . docusate sodium (COLACE) 100 MG capsule Take 100 mg by mouth daily as needed for mild constipation.  Marland Kitchen FINACEA 15 % FOAM Apply AS directed TO FACE daily  . glucosamine-chondroitin 500-400 MG tablet Take 1 tablet by mouth 3 (three) times daily.  Marland Kitchen losartan (COZAAR) 100 MG tablet TAKE ONE TABLET BY MOUTH ONCE DAILY  . mirabegron ER (MYRBETRIQ) 50 MG TB24 tablet Take 1 tablet (50 mg total) by mouth daily.  . Multiple Vitamin (MULTI-VITAMINS) TABS Take by mouth.  . Omega-3 1000 MG CAPS Take by mouth.  . Polyethylene Glycol 3350 (MIRALAX PO) Take by mouth as  needed.  . tretinoin (RETIN-A) 0.1 % cream APPLY A PEA SIZE AMOUNT TO FACE NIGHTLY  . Cyanocobalamin (VITAMIN B 12) 100 MCG LOZG Take by mouth.  Marland Kitchen ibuprofen (ADVIL,MOTRIN) 200 MG tablet Take 200 mg by mouth every 6 (six) hours as needed.  . Menthol, Topical Analgesic, (BLUE-EMU MAXIMUM STRENGTH EX) Apply topically.  . triamcinolone cream (KENALOG) 0.1 % APPLY TO AFFECTED AREAS OF LEGS AND CHEST TWICE A DAY AS NEEDED 2WEEK ON 2 WEEKS OFF  . Vitamin D, Ergocalciferol, (DRISDOL) 50000 units CAPS capsule Take 50,000 Units by mouth every 7 (seven) days.   No current facility-administered medications on file prior to visit.     Review of Systems  Constitutional: Negative for activity change, appetite change, chills, diaphoresis, fatigue and fever.  HENT: Negative for congestion and hearing loss.   Eyes: Negative for visual disturbance.  Respiratory: Negative for apnea, cough, choking, chest tightness, shortness of breath and wheezing.   Cardiovascular: Negative for chest pain, palpitations and leg swelling.  Gastrointestinal: Negative for abdominal pain, anal bleeding, blood in stool, constipation, diarrhea, nausea and vomiting.  Endocrine: Negative for cold intolerance.  Genitourinary: Negative for difficulty urinating, dysuria, frequency and hematuria.  Musculoskeletal: Negative for arthralgias, back pain and neck pain.  Skin: Negative for rash.  Allergic/Immunologic: Negative for environmental allergies.  Neurological: Negative for dizziness, weakness, light-headedness, numbness and headaches.       Neuropathy with tingling, numbness pain at times  Hematological: Negative for adenopathy.  Psychiatric/Behavioral: Negative for behavioral problems, dysphoric mood, self-injury, sleep disturbance and suicidal ideas. The patient is nervous/anxious.    Per HPI unless specifically indicated above     Objective:    BP (!) 156/72   Pulse 61   Temp 98.4 F (36.9 C) (Oral)   Resp 16   Ht 6' 3"  (1.905 m)   Wt 196 lb (88.9 kg)   BMI 24.50 kg/m   Wt Readings from Last 3 Encounters:  10/12/17 196 lb (88.9 kg)  07/27/17 196 lb (88.9 kg)  07/19/17 194 lb 4 oz (88.1 kg)  Physical Exam  Constitutional: He is oriented to person, place, and time. He appears well-developed and well-nourished. No distress.  Well-appearing, comfortable, cooperative  HENT:  Head: Normocephalic and atraumatic.  Mouth/Throat: Oropharynx is clear and moist.  Frontal / maxillary sinuses non-tender. Nares patent without purulence or edema. Bilateral TMs clear without significant cerumen only debris noted, also R TM with some clear to opaque effusion, without erythema or bulging. Oropharynx clear without erythema, exudates, edema or asymmetry.  Eyes: Pupils are equal, round, and reactive to light. Conjunctivae and EOM are normal. Right eye exhibits no discharge. Left eye exhibits no discharge.  Neck: Normal range of motion. Neck supple. No thyromegaly present.  Bilateral Carotid bruit heard radiating from heart  Cardiovascular: Normal rate, regular rhythm and intact distal pulses.  Murmur (2/6 systolic, increased over 2nd ICS space and radiates to carotids) heard. Pulmonary/Chest: Effort normal and breath sounds normal. No respiratory distress. He has no wheezes. He has no rales.  Abdominal: Soft. Bowel sounds are normal. He exhibits no distension and no mass. There is no tenderness.  Musculoskeletal: Normal range of motion. He exhibits no edema or tenderness.  Upper / Lower Extremities: - Normal muscle tone, strength bilateral upper extremities 5/5, lower extremities 5/5  Left hand - 4th finger palmar aspect with very tight flexor tendon consistent with dupuytren's flexion contracture, non tender, and also cyst of tendon 5th finger, still has some mobility  Lymphadenopathy:    He has no cervical adenopathy.  Neurological: He is alert and oriented to person, place, and time.  Distal sensation intact to light  touch all extremities  Skin: Skin is warm and dry. No rash noted. He is not diaphoretic. No erythema.  Psychiatric: He has a normal mood and affect. His behavior is normal.  Well groomed, good eye contact, normal speech and thoughts  Nursing note and vitals reviewed.  Results for orders placed or performed in visit on 06/30/17  CBC with Differential/Platelet  Result Value Ref Range   WBC 4.3 3.8 - 10.6 K/uL   RBC 4.18 (L) 4.40 - 5.90 MIL/uL   Hemoglobin 14.0 13.0 - 18.0 g/dL   HCT 40.5 40.0 - 52.0 %   MCV 96.8 80.0 - 100.0 fL   MCH 33.4 26.0 - 34.0 pg   MCHC 34.5 32.0 - 36.0 g/dL   RDW 13.7 11.5 - 14.5 %   Platelets 188 150 - 440 K/uL   Neutrophils Relative % 66 %   Neutro Abs 2.8 1.4 - 6.5 K/uL   Lymphocytes Relative 16 %   Lymphs Abs 0.7 (L) 1.0 - 3.6 K/uL   Monocytes Relative 11 %   Monocytes Absolute 0.5 0.2 - 1.0 K/uL   Eosinophils Relative 6 %   Eosinophils Absolute 0.3 0 - 0.7 K/uL   Basophils Relative 1 %   Basophils Absolute 0.0 0 - 0.1 K/uL  Kappa/lambda light chains  Result Value Ref Range   Kappa free light chain 20.6 (H) 3.3 - 19.4 mg/L   Lamda free light chains 13.1 5.7 - 26.3 mg/L   Kappa, lamda light chain ratio 1.57 0.26 - 1.65  Multiple Myeloma Panel (SPEP&IFE w/QIG)  Result Value Ref Range   IgG (Immunoglobin G), Serum 1,459 700 - 1,600 mg/dL   IgA 263 61 - 437 mg/dL   IgM (Immunoglobulin M), Srm 41 15 - 143 mg/dL   Total Protein ELP 6.9 6.0 - 8.5 g/dL   Albumin SerPl Elph-Mcnc 3.9 2.9 - 4.4 g/dL   Alpha 1 0.2 0.0 -  0.4 g/dL   Alpha2 Glob SerPl Elph-Mcnc 0.5 0.4 - 1.0 g/dL   B-Globulin SerPl Elph-Mcnc 1.0 0.7 - 1.3 g/dL   Gamma Glob SerPl Elph-Mcnc 1.3 0.4 - 1.8 g/dL   M Protein SerPl Elph-Mcnc 0.5 (H) Not Observed g/dL   Globulin, Total 3.0 2.2 - 3.9 g/dL   Albumin/Glob SerPl 1.4 0.7 - 1.7   IFE 1 Comment    Please Note Comment   Comprehensive metabolic panel  Result Value Ref Range   Sodium 136 135 - 145 mmol/L   Potassium 4.3 3.5 - 5.1 mmol/L    Chloride 106 101 - 111 mmol/L   CO2 23 22 - 32 mmol/L   Glucose, Bld 109 (H) 65 - 99 mg/dL   BUN 25 (H) 6 - 20 mg/dL   Creatinine, Ser 0.81 0.61 - 1.24 mg/dL   Calcium 9.0 8.9 - 10.3 mg/dL   Total Protein 7.4 6.5 - 8.1 g/dL   Albumin 3.9 3.5 - 5.0 g/dL   AST 43 (H) 15 - 41 U/L   ALT 34 17 - 63 U/L   Alkaline Phosphatase 77 38 - 126 U/L   Total Bilirubin 1.2 0.3 - 1.2 mg/dL   GFR calc non Af Amer >60 >60 mL/min   GFR calc Af Amer >60 >60 mL/min   Anion gap 7 5 - 15      Assessment & Plan:   Problem List Items Addressed This Visit    Dupuytren's contracture of left hand    Clinically consistent with Dupuytren's Flexion Contracture Left 4th finger Moderate severity still has good range of motion and function He will follow-up as planned with Ortho in future, especially sooner if progressive problem, understands may require surgical intervention      ED (erectile dysfunction)    Consistent with likely age-related, possibly with HTN. Also consider possible neurogenic if has peripheral neuropathy. Otheriwse no DM, no significant vascular disease with normal cholesterol.  Plan: 1. Refilled generic Sildenafil 52m tabs, take up to 5 tabs with #50 pills and refills since med is effective - printed, he will take to pharmacy      Relevant Medications   sildenafil (REVATIO) 20 MG tablet   Hypertension    Mildly elevated initial BP, decline repeat manual, home readings are normal Most consistent with acute situational anxiety elevating BP No known complications    Plan:  1. Continue current BP regimen - Losartan 1075mdaily - agree to not reduce dose from 5079mnless symptomatic with hypotension or other side effects 2. Encourage improved lifestyle - low sodium diet, regular exercise 3. Continue monitor BP outside office, bring readings to next visit, if persistently >140/90 or new symptoms notify office sooner 4. Follow-up 6 months       Relevant Medications   sildenafil  (REVATIO) 20 MG tablet   aspirin EC 81 MG tablet   MGUS (monoclonal gammopathy of unknown significance)    Incidentally diagnosed with work-up for neuropathy [facility] Yu Tasia Catchingsree with surveillance q 6 months, patient to continue labs through them      Moderate aortic stenosis    Stable without evidence of worsening symptoms [facility] FatUbaldo Glassingven persistent heart murmur and concern, request repeat ECHO for surveillance on aortic valve Ordered ECHOcardiogram first, and then pending result will consider referral to Cardiology he request [facility] in future      Relevant Medications   sildenafil (REVATIO) 20 MG tablet   aspirin  EC 81 MG tablet   Other Relevant Orders   ECHOCARDIOGRAM COMPLETE   Murmur, cardiac    See A&P for aortic stenosis - Ordered ECHO      Relevant Orders   ECHOCARDIOGRAM COMPLETE   Peripheral neuropathy    Suspected idiopathic vs hereditary etiology Follow[facility] Rufina Falco NP Has had extensive work-up EMG, heavy metal, A1c, B12, TSH, other labs, SPEP/UPEP, vascular studies - Failed SNRI Duloxetine (side effects), Lexapro, supplements  Plan: 1. Continue current plan per Neurology with Alpha Lipoic Acid, and monitoring and f/u in 6 months - Re-advised him that may not be able to give exact diagnosis, but our goal was to exclude other diagnoses      Relevant Medications   ALPRAZolam (XANAX) 0.5 MG tablet   Situational anxiety - Primary    Stable without worsening Chronic situational anxiety with some panic symptoms rarely  Controlled on PRN Alprazolam (Xanax) only No co-morbid depression / mood disorder. - Failed: Prozac 20, Duloxetine 30, Lexapro 10 (side effects), Buspar 5 1-2 times daily (side effect/intolerance) - No prior Psych / counseling  Plan: 1. Discussion on chronic use BDZ Alprazolam (Xanax) again and discussed potential risk for withdrawal and side effects or other  complications - Limited other options, and confirmed with [place] CSRS PMP Aware that he is taking appropriately and infrequently filling rx - Agree to rx controlled Alprazolam 0.80m BID #60 +2 refills - lasting him up to 1 year - Written, [facility] and [facility] - printed given patient copy and one we will scan - In future may check UDS Follow-up 6 months      Relevant Medications   ALPRAZolam (XANAX) 0.5 MG tablet    Other Visit Diagnoses    Abnormal glucose       Prior normal A1c 5.4 in past but still elevated glucose on chemistry test, will draw non fasting lab A1c today, defer other blood work at this time   Relevant Orders   Hemoglobin A1c      Meds ordered this encounter  Medications  . ALPRAZolam (XANAX) 0.5 MG tablet    Sig: TAKE 1 TABLET BY MOUTH 2 TIMES A DAY AS NEEDED FOR ANXIETY    Dispense:  60 tablet    Refill:  2  . sildenafil (REVATIO) 20 MG tablet    Sig: Take up to 5 pills about 30 min prior to sex    Dispense:  50 tablet    Refill:  8   Orders Placed This Encounter  Procedures  . Hemoglobin A1c  . ECHOCARDIOGRAM COMPLETE    Standing Status:   Future    Standing Expiration Date:   01/13/2019    Order Specific Question:   Where [facility]    [facility]   Order Specific Question:   Please indicate who you request to read the echo results.    Answer[facility]   [facility]    Order Specific Question:   Perflutren DEFINITY (image enhancing agent) should be administered unless hypersensitivity or allergy exist    Answer:   Administer Perflutren    Order Specific Question:   Expected Date:    Answer:   1 month    Follow up plan: Return in about 6 months (around 04/14/2018) for Updates neuropathy, ECHO/aortic stenosis, anxiety, f/u Oncology.  ANobie Putnam [facility] Group 10/12/2017, 3:46 PM

## 2017-10-12 NOTE — Assessment & Plan Note (Addendum)
Clinically consistent with Dupuytren's Flexion Contracture Left 4th finger Moderate severity still has good range of motion and function He will follow-up as planned with Ortho in future, especially sooner if progressive problem, understands may require surgical intervention

## 2017-10-12 NOTE — Assessment & Plan Note (Signed)
Stable without worsening Chronic situational anxiety with some panic symptoms rarely  Controlled on PRN Alprazolam (Xanax) only No co-morbid depression / mood disorder. - Failed: Prozac 20, Duloxetine 30, Lexapro 10 (side effects), Buspar 5 1-2 times daily (side effect/intolerance) - No prior Psych / counseling  Plan: 1. Discussion on chronic use BDZ Alprazolam (Xanax) again and discussed potential risk for withdrawal and side effects or other complications - Limited other options, and confirmed with  CSRS PMP Aware that he is taking appropriately and infrequently filling rx - Agree to rx controlled Alprazolam 0.5mg  BID #60 +2 refills - lasting him up to 1 year - Written, reviewed and signed [facility] Non Opioid Controlled Substance agreement - printed given patient copy and one we will scan - In future may check UDS Follow-up 6 months

## 2017-10-12 NOTE — Assessment & Plan Note (Signed)
Mildly elevated initial BP, decline repeat manual, home readings are normal Most consistent with acute situational anxiety elevating BP No known complications    Plan:  1. Continue current BP regimen - Losartan 100mg  daily - agree to not reduce dose from 50mg  unless symptomatic with hypotension or other side effects 2. Encourage improved lifestyle - low sodium diet, regular exercise 3. Continue monitor BP outside office, bring readings to next visit, if persistently >140/90 or new symptoms notify office sooner 4. Follow-up 6 months

## 2017-10-12 NOTE — Assessment & Plan Note (Signed)
See A&P for aortic stenosis - Ordered ECHO

## 2017-10-12 NOTE — Assessment & Plan Note (Signed)
Consistent with likely age-related, possibly with HTN. Also consider possible neurogenic if has peripheral neuropathy. Otheriwse no DM, no significant vascular disease with normal cholesterol.  Plan: 1. Refilled generic Sildenafil 20mg  tabs, take up to 5 tabs with #50 pills and refills since med is effective - printed, he will take to pharmacy

## 2017-10-12 NOTE — Assessment & Plan Note (Signed)
Stable without evidence of worsening symptoms Previously established on ECHO [facility] Dr Ubaldo Glassing Given persistent heart murmur and concern, request repeat ECHO for surveillance on aortic valve Ordered ECHOcardiogram first, and then pending result will consider referral to Cardiology he request [facility] in future

## 2017-10-13 LAB — HEMOGLOBIN A1C
Hgb A1c MFr Bld: 5.5 % of total Hgb (ref <5.7)
Mean Plasma Glucose: 111 (calc)
eAG (mmol/L): 6.2 (calc)

## 2017-11-05 ENCOUNTER — Telehealth: Payer: Self-pay | Admitting: Urology

## 2017-11-05 MED ORDER — MIRABEGRON ER 50 MG PO TB24: 50.0000 mg | ORAL_TABLET | Freq: Every day | ORAL | 3 refills | Status: DC

## 2017-11-05 NOTE — Telephone Encounter (Signed)
Patient let his RX for myrbetriq run out and is now waiting for a refill for his 90 days supply to be filled by his mail order pharmacy, they are supposed to be sending that today some time? In the mean time he wants to know if he can get a 30 day supply sent over to the local [facility] until this can be done or get samples today. He said he will be out in a few days.  Please call the patient with an answer. I explained that we are in clinic today and that we require a 48-72 hour refill request but I would see what we could do.   Sharyn Lull

## 2017-11-05 NOTE — Telephone Encounter (Signed)
Patient notified Rx was sent to pharmacy and he will come get sample until the mail order is sent.

## 2017-11-05 NOTE — Telephone Encounter (Signed)
Pt left message today about Rx refill.  He didn't mention which medication.  He said he needs an emergency 30 day supply to get him through.  I assume he may use a mail order pharmacy and is going to run out before he gets meds.  Please give pt a call.

## 2017-11-22 ENCOUNTER — Telehealth: Payer: Self-pay | Admitting: Urology

## 2017-11-22 NOTE — Telephone Encounter (Signed)
Pharmacy calling on behalf of pt, states they have not received Rx refill for Mybetriq 50mg  even tho chart states pharmacy received the fax. Pharmacy asks if someone one to actually call the Rx in to [phone], phramacist apologizes that he hasn't received the original order via Escribe. Please advise. Thanks.

## 2017-11-23 ENCOUNTER — Ambulatory Visit (INDEPENDENT_AMBULATORY_CARE_PROVIDER_SITE_OTHER): Payer: Medicare Other

## 2017-11-23 VITALS — BP 130/72 | HR 74 | Temp 98.4°F | Resp 16 | Ht 75.0 in | Wt 203.6 lb

## 2017-11-23 DIAGNOSIS — Z0000 Encounter for general adult medical examination without abnormal findings: Secondary | ICD-10-CM | POA: Diagnosis not present

## 2017-11-23 NOTE — Addendum Note (Signed)
Addended by: Tyler Aas A on: 11/23/2017 01:48 PM   Modules accepted: Orders

## 2017-11-23 NOTE — Progress Notes (Signed)
Subjective:   James Cardenas is a 77 y.o. male who presents for Medicare Annual/Subsequent preventive examination.  Review of Systems:    Cardiac Risk Factors include: advanced age (>69men, >43 women);dyslipidemia;hypertension;male gender     Objective:    Vitals: BP 130/72 (BP Location: Left Arm, Patient Position: Sitting)   Pulse 74   Temp 98.4 F (36.9 C) (Oral)   Resp 16   Ht 6\' 3"  (1.905 m)   Wt 203 lb 9.6 oz (92.4 kg)   BMI 25.45 kg/m   Body mass index is 25.45 kg/m.  Advanced Directives 11/23/2017 07/19/2017 06/30/2017 09/29/2016  Does Patient Have a Medical Advance Directive? Yes Yes Yes Yes  Type of Paramedic of [place];Living will Living will;Healthcare Power of [place];Living will [place];Living will  Copy of [place] in Chart? No - copy requested - - No - copy requested    Tobacco Social History   Tobacco Use  Smoking Status Never Smoker  Smokeless Tobacco Never Used     Counseling given: Not Answered   Clinical Intake:  Pre-visit preparation completed: Yes  Pain : No/denies pain     Nutritional Status: BMI 25 -29 Overweight Nutritional Risks: None Diabetes: No  How often do you need to have someone help you when you read instructions, pamphlets, or other written materials from your doctor or pharmacy?: 1 - Never What is the last grade level you completed in school?: bachelors   Interpreter Needed?: No  Information entered by :: Antoni Stefan,LPN   Past Medical History:  Diagnosis Date  . Anxiety   . Arthritis of knee   . Hypertension   . Murmur, cardiac   . Neuropathy    Past Surgical History:  Procedure Laterality Date  . NECK SURGERY  2011   Family History  Problem Relation Age of Onset  . Thyroid disease Mother   . Lung cancer Father   . Pancreatic cancer Brother   . Alzheimer's disease Sister   . Prostate cancer Neg Hx   . Bladder  Cancer Neg Hx   . Kidney cancer Neg Hx    Social History   Socioeconomic History  . Marital status: Divorced    Spouse name: Not on file  . Number of children: Not on file  . Years of education: Not on file  . Highest education level: Bachelor's degree (e.g., BA, AB, BS)  Occupational History  . Occupation: Retired Designer, multimedia, Public affairs consultant / heat treating)  Social Needs  . Financial resource strain: Not hard at all  . Food insecurity:    Worry: Never true    Inability: Never true  . Transportation needs:    Medical: No    Non-medical: No  Tobacco Use  . Smoking status: Never Smoker  . Smokeless tobacco: Never Used  Substance and Sexual Activity  . Alcohol use: Yes    Alcohol/week: 8.0 standard drinks    Types: 7 Glasses of wine, 1 Cans of beer per week  . Drug use: No  . Sexual activity: Yes  Lifestyle  . Physical activity:    Days per week: 3 days    Minutes per session: 30 min  . Stress: Not at all  Relationships  . Social connections:    Talks on phone: More than three times a week    Gets together: More than three times a week    Attends religious service: More than 4 times per year  Active member of club or organization: No    Attends meetings of clubs or organizations: Never    Relationship status: Divorced  Other Topics Concern  . Not on file  Social History Narrative  . Not on file    Outpatient Encounter Medications as of 11/23/2017  Medication Sig  . Acetylcarn-Alpha Lipoic Acid 400-200 MG CAPS Take by mouth.  . ALPRAZolam (XANAX) 0.5 MG tablet TAKE 1 TABLET BY MOUTH 2 TIMES A DAY AS NEEDED FOR ANXIETY  . aspirin EC 81 MG tablet Take 81 mg by mouth daily.  . Cholecalciferol (VITAMIN D3) 1000 units CAPS Take 500 Units by mouth.  . Cyanocobalamin (VITAMIN B 12) 100 MCG LOZG Take by mouth.  . docusate sodium (COLACE) 100 MG capsule Take 100 mg by mouth daily as needed for mild constipation.  Marland Kitchen FINACEA 15 % FOAM Apply AS directed TO FACE daily  .  glucosamine-chondroitin 500-400 MG tablet Take 1 tablet by mouth 3 (three) times daily.  Marland Kitchen ibuprofen (ADVIL,MOTRIN) 200 MG tablet Take 200 mg by mouth every 6 (six) hours as needed.  Marland Kitchen losartan (COZAAR) 100 MG tablet TAKE ONE TABLET BY MOUTH ONCE DAILY  . Menthol, Topical Analgesic, (BLUE-EMU MAXIMUM STRENGTH EX) Apply topically.  . mirabegron ER (MYRBETRIQ) 50 MG TB24 tablet Take 1 tablet (50 mg total) by mouth daily.  . Multiple Vitamin (MULTI-VITAMINS) TABS Take by mouth.  . Omega-3 1000 MG CAPS Take by mouth.  . Polyethylene Glycol 3350 (MIRALAX PO) Take by mouth as needed.  . sildenafil (REVATIO) 20 MG tablet Take up to 5 pills about 30 min prior to sex  . tretinoin (RETIN-A) 0.1 % cream APPLY A PEA SIZE AMOUNT TO FACE NIGHTLY  . triamcinolone cream (KENALOG) 0.1 % APPLY TO AFFECTED AREAS OF LEGS AND CHEST TWICE A DAY AS NEEDED 2WEEK ON 2 WEEKS OFF  . Calcium Carbonate-Vitamin D 600-200 MG-UNIT TABS Take by mouth.  . clindamycin (CLINDAGEL) 1 % gel APPLY TWICE A DAY AS DIRECTED  . Vitamin D, Ergocalciferol, (DRISDOL) 50000 units CAPS capsule Take 50,000 Units by mouth every 7 (seven) days.   No facility-administered encounter medications on file as of 11/23/2017.     Activities of Daily Living In your present state of health, do you have any difficulty performing the following activities: 11/23/2017 10/12/2017  Hearing? Y N  Comment difficulty with high frequency noises  -  Vision? N N  Difficulty concentrating or making decisions? N N  Walking or climbing stairs? N N  Dressing or bathing? N N  Doing errands, shopping? N N  Preparing Food and eating ? N -  Using the Toilet? N -  In the past six months, have you accidently leaked urine? N -  Do you have problems with loss of bowel control? N -  Managing your Medications? N -  Managing your Finances? N -  Housekeeping or managing your Housekeeping? N -  Some recent data might be hidden    Patient Care Team: Olin Hauser, DO as PCP - General (Family Medicine) Renata Caprice as Physician Assistant (Orthopedic Surgery) Nickie Retort, MD as Consulting Physician (Urology) Gardiner Barefoot, DPM as Consulting Physician (Podiatry) Stark Klein Bing Neighbors, NP as Nurse Practitioner (Neurology)   Assessment:   This is a routine wellness examination for Ona.  Exercise Activities and Dietary recommendations Current Exercise Habits: Home exercise routine(works outside 3 days a week as well ), Time (Minutes): 30, Frequency (Times/Week): 3, Weekly Exercise (Minutes/Week): 90, Intensity:  Moderate, Exercise limited by: None identified  Goals    . Increase water intake     Recommend drinking at least 6-8 glasses of water a day        Fall Risk Fall Risk  11/23/2017 10/12/2017 09/29/2016 07/02/2016  Falls in the past year? No No No No   Is the patient's home free of loose throw rugs in walkways, pet beds, electrical cords, etc?   yes      Grab bars in the bathroom? no      Handrails on the stairs?   yes      Adequate lighting?   yes  Timed Get Up and Go Performed: Completed in  8 seconds with no use of assistive devices, steady gait. No intervention needed at this time.   Depression Screen PHQ 2/9 Scores 11/23/2017 10/12/2017 04/16/2017 10/06/2016  PHQ - 2 Score 0 0 0 0  PHQ- 9 Score - - 0 -    Cognitive Function     6CIT Screen 11/23/2017 09/29/2016  What Year? 0 points 0 points  What month? 0 points 0 points  What time? 0 points 0 points  Count back from 20 0 points 0 points  Months in reverse 0 points 0 points  Repeat phrase 2 points 0 points  Total Score 2 0    Immunization History  Administered Date(s) Administered  . Influenza, High Dose Seasonal PF 02/12/2017  . Influenza-Unspecified 11/12/2015  . Pneumococcal Conjugate-13 07/02/2016  . Pneumococcal Polysaccharide-23 05/24/2008  . Tetanus 08/01/2010  . Zoster 04/13/2009    Qualifies for Shingles Vaccine? Yes, discussed shingrix  vaccine   Screening Tests Health Maintenance  Topic Date Due  . INFLUENZA VACCINE  11/11/2017  . TETANUS/TDAP  07/31/2020  . PNA vac Low Risk Adult  Completed   Cancer Screenings: Lung: Low Dose CT Chest recommended if Age 52-80 years, 30 pack-year currently smoking OR have quit w/in 15years. Patient does not qualify. Colorectal: no longer required  Additional Screenings:  Hepatitis C Screening:not indicated       Plan:    I have personally reviewed and addressed the Medicare Annual Wellness questionnaire and have noted the following in the patient's chart:  A. Medical and social history B. Use of alcohol, tobacco or illicit drugs  C. Current medications and supplements D. Functional ability and status E.  Nutritional status F.  Physical activity G. Advance directives H. List of other physicians I.  Hospitalizations, surgeries, and ER visits in previous 12 months J.  [place] such as hearing and vision if needed, cognitive and depression L. Referrals and appointments   In addition, I have reviewed and discussed with patient certain preventive protocols, quality metrics, and best practice recommendations. A written personalized care plan for preventive services as well as general preventive health recommendations were provided to patient.   Signed,  Tyler Aas, LPN Nurse Health Advisor   Nurse Notes:none

## 2017-11-23 NOTE — Patient Instructions (Addendum)
James Cardenas , Thank you for taking time to come for yourMedicare Wellness Visit. I appreciate your ongoing commitment to your health goals. Please review the following plan we discussed and let me know if I can assist you in the future.   Screening recommendations/referrals: Colonoscopy: Completed 04/2009, no longer required Recommended yearly ophthalmology/optometry visit for glaucoma screening and checkup Recommended yearly dental visit for hygiene and checkup  Vaccinations: Influenza vaccine: up to date, due 12/2017 Pneumococcal vaccine: up to date  Tdap vaccine: up to date Shingles vaccine: shingrix eligible, check with your insurance company for coverage    Advanced directives: Please bring a copy of your health care power of attorney and living will to the office at your convenience.  Conditions/risks identified: Recommend drinking at least 6-8 glasses of water a day   Next appointment: Follow up on 04/19/2018 at 10:40am with Dr.Karamalegos. Follow up in one year for your annual wellness exam.   Preventive Care 65 Years and Older, Male Preventive care refers to lifestyle choices and visits with your health care provider that can promote health and wellness. What does preventive care include?  A yearly physical exam. This is also called an annual well check.  Dental exams once or twice a year.  Routine eye exams. Ask your health care provider how often you should have your eyes checked.  Personal lifestyle choices, including:  Daily care of your teeth and gums.  Regular physical activity.  Eating a healthy diet.  Avoiding tobacco and drug use.  Limiting alcohol use.  Practicing safe sex.  Taking low doses of aspirin every day.  Taking vitamin and mineral supplements as recommended by your health care provider. What happens during an annual well check? The services and screenings done by your health care provider during your annual well check will depend on your  age, overall health, lifestyle risk factors, and family history of disease. Counseling  Your health care provider may ask you questions about your:  Alcohol use.  Tobacco use.  Drug use.  Emotional well-being.  Home and relationship well-being.  Sexual activity.  Eating habits.  History of falls.  Memory and ability to understand (cognition).  Work and work Statistician. Screening  You may have the following tests or measurements:  Height, weight, and BMI.  Blood pressure.  Lipid and cholesterol levels. These may be checked every 5 years, or more frequently if you are over 43 years old.  Skin check.  Lung cancer screening. You may have this screening every year starting at age 10 if you have a 30-pack-year history of smoking and currently smoke or have quit within the past 15 years.  Fecal occult blood test (FOBT) of the stool. You may have this test every year starting at age 47.  Flexible sigmoidoscopy or colonoscopy. You may have a sigmoidoscopy every 5 years or a colonoscopy every 10 years starting at age 84.  Prostate cancer screening. Recommendations will vary depending on your family history and other risks.  Hepatitis C blood test.  Hepatitis B blood test.  Sexually transmitted disease (STD) testing.  Diabetes screening. This is done by checking your blood sugar (glucose) after you have not eaten for a while (fasting). You may have this done every 1-3 years.  Abdominal aortic aneurysm (AAA) screening. You may need this if you are a current or former smoker.  Osteoporosis. You may be screened starting at age 27 if you are at high risk. Talk with your health care provider about your test  results, treatment options, and if necessary, the need for more tests. Vaccines  Your health care provider may recommend certain vaccines, such as:  Influenza vaccine. This is recommended every year.  Tetanus, diphtheria, and acellular pertussis (Tdap, Td) vaccine. You  may need a Td booster every 10 years.  Zoster vaccine. You may need this after age 75.  Pneumococcal 13-valent conjugate (PCV13) vaccine. One dose is recommended after age 39.  Pneumococcal polysaccharide (PPSV23) vaccine. One dose is recommended after age 51. Talk to your health care provider about which screenings and vaccines you need and how often you need them. This information is not intended to replace advice given to you by your health care provider. Make sure you discuss any questions you have with your health care provider. Document Released: 04/26/2015 Document Revised: 12/18/2015 Document Reviewed: 01/29/2015 Elsevier Interactive Patient Education  2017 [place] Prevention in the Home Falls can cause injuries. They can happen to people of all ages. There are many things you can do to make your home safe and to help prevent falls. What can I do on the outside of my home?  Regularly fix the edges of walkways and driveways and fix any cracks.  Remove anything that might make you trip as you walk through a door, such as a raised step or threshold.  Trim any bushes or trees on the path to your home.  Use bright outdoor lighting.  Clear any walking paths of anything that might make someone trip, such as rocks or tools.  Regularly check to see if handrails are loose or broken. Make sure that both sides of any steps have handrails.  Any raised decks and porches should have guardrails on the edges.  Have any leaves, snow, or ice cleared regularly.  Use sand or salt on walking paths during winter.  Clean up any spills in your garage right away. This includes oil or grease spills. What can I do in the bathroom?  Use night lights.  Install grab bars by the toilet and in the tub and shower. Do not use towel bars as grab bars.  Use non-skid mats or decals in the tub or shower.  If you need to sit down in the shower, use a plastic, non-slip stool.  Keep the floor  dry. Clean up any water that spills on the floor as soon as it happens.  Remove soap buildup in the tub or shower regularly.  Attach bath mats securely with double-sided non-slip rug tape.  Do not have throw rugs and other things on the floor that can make you trip. What can I do in the bedroom?  Use night lights.  Make sure that you have a light by your bed that is easy to reach.  Do not use any sheets or blankets that are too big for your bed. They should not hang down onto the floor.  Have a firm chair that has side arms. You can use this for support while you get dressed.  Do not have throw rugs and other things on the floor that can make you trip. What can I do in the kitchen?  Clean up any spills right away.  Avoid walking on wet floors.  Keep items that you use a lot in easy-to-reach places.  If you need to reach something above you, use a strong step stool that has a grab bar.  Keep electrical cords out of the way.  Do not use floor polish or wax that makes  floors slippery. If you must use wax, use non-skid floor wax.  Do not have throw rugs and other things on the floor that can make you trip. What can I do with my stairs?  Do not leave any items on the stairs.  Make sure that there are handrails on both sides of the stairs and use them. Fix handrails that are broken or loose. Make sure that handrails are as long as the stairways.  Check any carpeting to make sure that it is firmly attached to the stairs. Fix any carpet that is loose or worn.  Avoid having throw rugs at the top or bottom of the stairs. If you do have throw rugs, attach them to the floor with carpet tape.  Make sure that you have a light switch at the top of the stairs and the bottom of the stairs. If you do not have them, ask someone to add them for you. What else can I do to help prevent falls?  Wear shoes that:  Do not have high heels.  Have rubber bottoms.  Are comfortable and fit you  well.  Are closed at the toe. Do not wear sandals.  If you use a stepladder:  Make sure that it is fully opened. Do not climb a closed stepladder.  Make sure that both sides of the stepladder are locked into place.  Ask someone to hold it for you, if possible.  Clearly mark and make sure that you can see:  Any grab bars or handrails.  First and last steps.  Where the edge of each step is.  Use tools that help you move around (mobility aids) if they are needed. These include:  Canes.  Walkers.  Scooters.  Crutches.  Turn on the lights when you go into a dark area. Replace any light bulbs as soon as they burn out.  Set up your furniture so you have a clear path. Avoid moving your furniture around.  If any of your floors are uneven, fix them.  If there are any pets around you, be aware of where they are.  Review your medicines with your doctor. Some medicines can make you feel dizzy. This can increase your chance of falling. Ask your doctor what other things that you can do to help prevent falls. This information is not intended to replace advice given to you by your health care provider. Make sure you discuss any questions you have with your health care provider. Document Released: 01/24/2009 Document Revised: 09/05/2015 Document Reviewed: 05/04/2014 Elsevier Interactive Patient Education  2017 [employer].

## 2017-12-01 DIAGNOSIS — H6121 Impacted cerumen, right ear: Secondary | ICD-10-CM | POA: Diagnosis not present

## 2017-12-01 DIAGNOSIS — H93299 Other abnormal auditory perceptions, unspecified ear: Secondary | ICD-10-CM | POA: Diagnosis not present

## 2017-12-06 ENCOUNTER — Encounter: Payer: Self-pay | Admitting: Podiatry

## 2017-12-06 ENCOUNTER — Ambulatory Visit (INDEPENDENT_AMBULATORY_CARE_PROVIDER_SITE_OTHER): Payer: Medicare Other | Admitting: Podiatry

## 2017-12-06 DIAGNOSIS — M2041 Other hammer toe(s) (acquired), right foot: Secondary | ICD-10-CM | POA: Diagnosis not present

## 2017-12-06 DIAGNOSIS — M2042 Other hammer toe(s) (acquired), left foot: Secondary | ICD-10-CM | POA: Diagnosis not present

## 2017-12-06 DIAGNOSIS — B351 Tinea unguium: Secondary | ICD-10-CM | POA: Diagnosis not present

## 2017-12-06 DIAGNOSIS — M79676 Pain in unspecified toe(s): Secondary | ICD-10-CM | POA: Diagnosis not present

## 2017-12-06 NOTE — Progress Notes (Signed)
Complaint:  Visit Type: Patient returns to my office for continued preventative foot care services. Complaint: Patient states" my nails have grown long and thick and become painful to walk and wear shoes" Patient has been previously treated for clavi/ulcer third toe left foot which has healed. The patient presents for preventative foot care services. No changes to ROS  Podiatric Exam: Vascular: dorsalis pedis and posterior tibial pulses are palpable bilateral. Capillary return is immediate. Temperature gradient is WNL. Skin turgor WNL  Sensorium: Normal Semmes Weinstein monofilament test. Normal tactile sensation bilaterally. Nail Exam: Pt has thick disfigured discolored nails with subungual debris noted bilateral entire nail hallux through fifth toenails Ulcer Exam: There is no evidence of ulcer or pre-ulcerative changes or infection. Orthopedic Exam: Muscle tone and strength are WNL. No limitations in general ROM. No crepitus or effusions noted. Foot type and digits show no abnormalities. Hallux malleus  B/L  Hammer toes  B/l. Skin: No Porokeratosis. No infection or ulcers.    Diagnosis:  Onychomycosis, , Pain in right toe, pain in left toes  Treatment & Plan Procedures and Treatment: Consent by patient was obtained for treatment procedures.   Debridement of mycotic and hypertrophic toenails, 1 through 5 bilateral and clearing of subungual debris. No ulceration, no infection noted.  Return Visit-Office Procedure: Patient instructed to return to the office for a follow up visit 10 weeks  for continued evaluation and treatment.    Gardiner Barefoot DPM

## 2017-12-15 DIAGNOSIS — H6980 Other specified disorders of Eustachian tube, unspecified ear: Secondary | ICD-10-CM | POA: Diagnosis not present

## 2017-12-15 DIAGNOSIS — H903 Sensorineural hearing loss, bilateral: Secondary | ICD-10-CM | POA: Diagnosis not present

## 2017-12-15 DIAGNOSIS — H60391 Other infective otitis externa, right ear: Secondary | ICD-10-CM | POA: Diagnosis not present

## 2017-12-15 DIAGNOSIS — H6121 Impacted cerumen, right ear: Secondary | ICD-10-CM | POA: Diagnosis not present

## 2017-12-31 DIAGNOSIS — Z23 Encounter for immunization: Secondary | ICD-10-CM | POA: Diagnosis not present

## 2018-01-11 DIAGNOSIS — D485 Neoplasm of uncertain behavior of skin: Secondary | ICD-10-CM | POA: Diagnosis not present

## 2018-01-11 DIAGNOSIS — D2271 Melanocytic nevi of right lower limb, including hip: Secondary | ICD-10-CM | POA: Diagnosis not present

## 2018-01-11 DIAGNOSIS — D225 Melanocytic nevi of trunk: Secondary | ICD-10-CM | POA: Diagnosis not present

## 2018-01-11 DIAGNOSIS — D2261 Melanocytic nevi of right upper limb, including shoulder: Secondary | ICD-10-CM | POA: Diagnosis not present

## 2018-01-11 DIAGNOSIS — X32XXXA Exposure to sunlight, initial encounter: Secondary | ICD-10-CM | POA: Diagnosis not present

## 2018-01-11 DIAGNOSIS — L438 Other lichen planus: Secondary | ICD-10-CM | POA: Diagnosis not present

## 2018-01-11 DIAGNOSIS — Z85828 Personal history of other malignant neoplasm of skin: Secondary | ICD-10-CM | POA: Diagnosis not present

## 2018-01-11 DIAGNOSIS — D2262 Melanocytic nevi of left upper limb, including shoulder: Secondary | ICD-10-CM | POA: Diagnosis not present

## 2018-01-11 DIAGNOSIS — D2272 Melanocytic nevi of left lower limb, including hip: Secondary | ICD-10-CM | POA: Diagnosis not present

## 2018-01-11 DIAGNOSIS — L570 Actinic keratosis: Secondary | ICD-10-CM | POA: Diagnosis not present

## 2018-01-11 DIAGNOSIS — Z08 Encounter for follow-up examination after completed treatment for malignant neoplasm: Secondary | ICD-10-CM | POA: Diagnosis not present

## 2018-01-11 DIAGNOSIS — C4441 Basal cell carcinoma of skin of scalp and neck: Secondary | ICD-10-CM | POA: Diagnosis not present

## 2018-01-11 DIAGNOSIS — L821 Other seborrheic keratosis: Secondary | ICD-10-CM | POA: Diagnosis not present

## 2018-01-17 ENCOUNTER — Other Ambulatory Visit: Payer: Self-pay

## 2018-01-17 ENCOUNTER — Encounter: Payer: Self-pay | Admitting: Oncology

## 2018-01-17 ENCOUNTER — Inpatient Hospital Stay: Payer: Medicare Other | Attending: Oncology | Admitting: Oncology

## 2018-01-17 ENCOUNTER — Inpatient Hospital Stay: Payer: Medicare Other

## 2018-01-17 VITALS — BP 115/66 | HR 61 | Temp 96.8°F | Resp 18 | Wt 197.7 lb

## 2018-01-17 DIAGNOSIS — G629 Polyneuropathy, unspecified: Secondary | ICD-10-CM

## 2018-01-17 DIAGNOSIS — D72810 Lymphocytopenia: Secondary | ICD-10-CM

## 2018-01-17 DIAGNOSIS — D472 Monoclonal gammopathy: Secondary | ICD-10-CM

## 2018-01-17 DIAGNOSIS — Z79899 Other long term (current) drug therapy: Secondary | ICD-10-CM | POA: Insufficient documentation

## 2018-01-17 LAB — CBC WITH DIFFERENTIAL/PLATELET
Basophils Absolute: 0 10*3/uL (ref 0–0.1)
Basophils Relative: 1 %
Eosinophils Absolute: 0.3 10*3/uL (ref 0–0.7)
Eosinophils Relative: 6 %
HCT: 40.9 % (ref 40.0–52.0)
Hemoglobin: 13.9 g/dL (ref 13.0–18.0)
Lymphocytes Relative: 17 %
Lymphs Abs: 0.8 10*3/uL — ABNORMAL LOW (ref 1.0–3.6)
MCH: 33.3 pg (ref 26.0–34.0)
MCHC: 33.9 g/dL (ref 32.0–36.0)
MCV: 98 fL (ref 80.0–100.0)
Monocytes Absolute: 0.3 10*3/uL (ref 0.2–1.0)
Monocytes Relative: 8 %
Neutro Abs: 3.1 10*3/uL (ref 1.4–6.5)
Neutrophils Relative %: 68 %
Platelets: 190 10*3/uL (ref 150–440)
RBC: 4.17 MIL/uL — ABNORMAL LOW (ref 4.40–5.90)
RDW: 13.5 % (ref 11.5–14.5)
WBC: 4.6 10*3/uL (ref 3.8–10.6)

## 2018-01-17 LAB — COMPREHENSIVE METABOLIC PANEL
ALT: 21 U/L (ref 0–44)
AST: 28 U/L (ref 15–41)
Albumin: 4.1 g/dL (ref 3.5–5.0)
Alkaline Phosphatase: 75 U/L (ref 38–126)
Anion gap: 6 (ref 5–15)
BUN: 21 mg/dL (ref 8–23)
CO2: 27 mmol/L (ref 22–32)
Calcium: 9.1 mg/dL (ref 8.9–10.3)
Chloride: 103 mmol/L (ref 98–111)
Creatinine, Ser: 1.02 mg/dL (ref 0.61–1.24)
GFR calc Af Amer: >60 mL/min (ref >60)
GFR calc non Af Amer: >60 mL/min (ref >60)
Glucose, Bld: 155 mg/dL — ABNORMAL HIGH (ref 70–99)
Potassium: 4.4 mmol/L (ref 3.5–5.1)
Sodium: 136 mmol/L (ref 135–145)
Total Bilirubin: 1 mg/dL (ref 0.3–1.2)
Total Protein: 7.1 g/dL (ref 6.5–8.1)

## 2018-01-17 NOTE — Progress Notes (Signed)
Patient here for follow up. No concerns voiced.  °

## 2018-01-17 NOTE — Progress Notes (Signed)
Hematology/Oncology follow up  note [facility] [phone] [phone]   Patient Care Team: Olin Hauser, DO as PCP - General (Family Medicine) Renata Caprice as Physician Assistant (Orthopedic Surgery) Nickie Retort, MD as Consulting Physician (Urology) Gardiner Barefoot, DPM as Consulting Physician (Podiatry) Stark Klein Bing Neighbors, NP as Nurse Practitioner (Neurology)  REFERRING PROVIDER: Dr.Shah  CHIEF COMPLAINTS/PURPOSE OF CONSULTATION:  Evaluation of elevated M spike.  HISTORY OF PRESENTING ILLNESS:  James Cardenas is a  77 y.o.  male with PMH listed below who was referred to me for evaluation of elevated M spike.  Patient follows up with [facility] clinic Neurology Dr. Bobbye Riggs for evaluation of neuropathy.  Patient states that symptom has started in 2060 has slightly progressed.  Mostly focus on posterior lower extremity, feels like bilateral aching, soreness, tightness.  Intermittently he will also have numbness and tingling in his feet without burning and pain.  He has had extensive workup done including normal thyroid function, normal B12, ESR, ANA, rheumatoid factor, diabetes screening, heavy metal screening.Marland Kitchen  He has had SPEP and immunofixation which showed IgG monoclonal antibody with kappa light chain specificity, M spike 0.7 g/dL.  Patient denies any bone pain.  He has good energy level and remains very active.  No raynaud's syndrome   INTERVAL HISTORY James Cardenas is a 77 y.o. male who has above history reviewed by me today present for follow-up visit for management of MGUS. During interval, patient denies any recent admissions.  Feeling well at baseline. Reports that he has chronic lower extremity neuropathy has been stable.  No other new complaints.   Review of Systems  Constitutional: Negative for chills, fever, malaise/fatigue and weight loss.  HENT: Negative for ear discharge, ear pain, nosebleeds and  sore throat.   Eyes: Negative for blurred vision, double vision, photophobia, pain and redness.  Respiratory: Negative for cough, hemoptysis, sputum production, shortness of breath and wheezing.   Cardiovascular: Negative for chest pain, palpitations and orthopnea.  Gastrointestinal: Negative for abdominal pain, blood in stool, nausea and vomiting.  Genitourinary: Negative for dysuria and urgency.  Musculoskeletal: Negative for back pain, myalgias and neck pain.  Skin: Negative for itching and rash.  Neurological: Negative for dizziness, tingling, tremors and focal weakness.  Endo/Heme/Allergies: Negative for environmental allergies. Does not bruise/bleed easily.  Psychiatric/Behavioral: Negative for depression.    MEDICAL HISTORY:  Past Medical History:  Diagnosis Date  . Anxiety   . Arthritis of knee   . Hypertension   . Murmur, cardiac   . Neuropathy     SURGICAL HISTORY: Past Surgical History:  Procedure Laterality Date  . NECK SURGERY  2011    SOCIAL HISTORY: Social History   Socioeconomic History  . Marital status: Divorced    Spouse name: Not on file  . Number of children: Not on file  . Years of education: Not on file  . Highest education level: Bachelor's degree (e.g., BA, AB, BS)  Occupational History  . Occupation: Retired Designer, multimedia, Public affairs consultant / heat treating)  Social Needs  . Financial resource strain: Not hard at all  . Food insecurity:    Worry: Never true    Inability: Never true  . Transportation needs:    Medical: No    Non-medical: No  Tobacco Use  . Smoking status: Never Smoker  . Smokeless tobacco: Never Used  Substance and Sexual Activity  . Alcohol use: Yes    Alcohol/week: 8.0 standard drinks    Types:  7 Glasses of wine, 1 Cans of beer per week  . Drug use: No  . Sexual activity: Yes  Lifestyle  . Physical activity:    Days per week: 3 days    Minutes per session: 30 min  . Stress: Not at all  Relationships  . Social  connections:    Talks on phone: More than three times a week    Gets together: More than three times a week    Attends religious service: More than 4 times per year    Active member of club or organization: No    Attends meetings of clubs or organizations: Never    Relationship status: Divorced  . Intimate partner violence:    Fear of current or ex partner: No    Emotionally abused: No    Physically abused: No    Forced sexual activity: No  Other Topics Concern  . Not on file  Social History Narrative  . Not on file    FAMILY HISTORY: Family History  Problem Relation Age of Onset  . Thyroid disease Mother   . Lung cancer Father   . Pancreatic cancer Brother   . Alzheimer's disease Sister   . Prostate cancer Neg Hx   . Bladder Cancer Neg Hx   . Kidney cancer Neg Hx     ALLERGIES:  has No Known Allergies.  MEDICATIONS:  Current Outpatient Medications  Medication Sig Dispense Refill  . Acetylcarn-Alpha Lipoic Acid 400-200 MG CAPS Take by mouth daily.     Marland Kitchen ALPRAZolam (XANAX) 0.5 MG tablet TAKE 1 TABLET BY MOUTH 2 TIMES A DAY AS NEEDED FOR ANXIETY 60 tablet 2  . aspirin EC 81 MG tablet Take 81 mg by mouth daily.    . Cholecalciferol (VITAMIN D3) 1000 units CAPS Take 500 Units by mouth.    . clindamycin (CLINDAGEL) 1 % gel APPLY TWICE A DAY AS DIRECTED  3  . docusate sodium (COLACE) 100 MG capsule Take 100 mg by mouth daily as needed for mild constipation.    Marland Kitchen FINACEA 15 % FOAM Apply AS directed TO FACE daily  6  . glucosamine-chondroitin 500-400 MG tablet Take 1 tablet by mouth 3 (three) times daily.    Marland Kitchen ibuprofen (ADVIL,MOTRIN) 200 MG tablet Take 200 mg by mouth every 6 (six) hours as needed.    Marland Kitchen losartan (COZAAR) 100 MG tablet TAKE ONE TABLET BY MOUTH ONCE DAILY 90 tablet 2  . mirabegron ER (MYRBETRIQ) 50 MG TB24 tablet Take 1 tablet (50 mg total) by mouth daily. 90 tablet 3  . Multiple Vitamin (MULTI-VITAMINS) TABS Take by mouth.    . Omega-3 1000 MG CAPS Take by  mouth.    . Polyethylene Glycol 3350 (MIRALAX PO) Take by mouth as needed.    . sildenafil (REVATIO) 20 MG tablet Take up to 5 pills about 30 min prior to sex 50 tablet 8  . Calcium Carbonate-Vitamin D 600-200 MG-UNIT TABS Take by mouth.    . Cyanocobalamin (VITAMIN B 12) 100 MCG LOZG Take by mouth.    . fluticasone (FLONASE) 50 MCG/ACT nasal spray SPRAY 2 SPRAYS INTO EACH NOSTRIL EVERY DAY  12  . Menthol, Topical Analgesic, (BLUE-EMU MAXIMUM STRENGTH EX) Apply topically.    . tretinoin (RETIN-A) 0.1 % cream APPLY A PEA SIZE AMOUNT TO FACE NIGHTLY  0  . triamcinolone cream (KENALOG) 0.1 % APPLY TO AFFECTED AREAS OF LEGS AND CHEST TWICE A DAY AS NEEDED 2WEEK ON 2 WEEKS OFF  0  .  Vitamin D, Ergocalciferol, (DRISDOL) 50000 units CAPS capsule Take 50,000 Units by mouth every 7 (seven) days.     No current facility-administered medications for this visit.      PHYSICAL EXAMINATION: ECOG PERFORMANCE STATUS: 0 - Asymptomatic Vitals:   01/17/18 1436  BP: 115/66  Pulse: 61  Resp: 18  Temp: (!) 96.8 F (36 C)   Filed Weights   01/17/18 1436  Weight: 197 lb 11.2 oz (89.7 kg)    Physical Exam  Constitutional: He is oriented to person, place, and time. No distress.  HENT:  Head: Normocephalic and atraumatic.  Nose: Nose normal.  Mouth/Throat: Oropharynx is clear and moist. No oropharyngeal exudate.  Eyes: Pupils are equal, round, and reactive to light. Conjunctivae and EOM are normal. No scleral icterus.  Neck: Normal range of motion. Neck supple. No JVD present.  Cardiovascular: Normal rate and regular rhythm.  Murmur heard. Pulmonary/Chest: Effort normal and breath sounds normal. No respiratory distress. He has no wheezes. He has no rales. He exhibits no tenderness.  Abdominal: Soft. Bowel sounds are normal. He exhibits no distension. There is no tenderness. There is no rebound and no guarding.  Musculoskeletal: Normal range of motion. He exhibits no edema.  Lymphadenopathy:    He  has no cervical adenopathy.  Neurological: He is alert and oriented to person, place, and time. No cranial nerve deficit. He exhibits normal muscle tone. Coordination normal.  Skin: Skin is warm and dry. No rash noted. He is not diaphoretic. No erythema.  Psychiatric: Memory, affect and judgment normal.     LABORATORY DATA:  I have reviewed the data as listed Lab Results  Component Value Date   WBC 4.6 01/17/2018   HGB 13.9 01/17/2018   HCT 40.9 01/17/2018   MCV 98.0 01/17/2018   PLT 190 01/17/2018   Recent Labs    06/30/17 1210 01/17/18 1411  NA 136 136  K 4.3 4.4  CL 106 103  CO2 23 27  GLUCOSE 109* 155*  BUN 25* 21  CREATININE 0.81 1.02  CALCIUM 9.0 9.1  GFRNONAA >60 >60  GFRAA >60 >60  PROT 7.4 7.1  ALBUMIN 3.9 4.1  AST 43* 28  ALT 34 21  ALKPHOS 77 75  BILITOT 1.2 1.0   09/29/2017 Vitamin B12 920 Labs done at [facility] 06/08/2017 Folate 22.3    ASSESSMENT & PLAN:  1. MGUS (monoclonal gammopathy of unknown significance)   2. Lymphocytopenia    #Lab work-up reviewed and discussed with patient. IgG Kappa MGUS, and this is a pre myeloma state. Each year a small portion of patient progressed to active multiple myeloma, roughly 1% per year. Recommend lab surveillance and follow up every 6 months.   CBC showed chronic lymphocytopenia, persistent. Check hepatitis panel, flowcytometry  All questions were answered. The patient knows to call the clinic with any problems questions or concerns.  Return of visit: 6 months.  Earlie Server, MD, PhD Hematology Oncology [facility] at [facility] Pager- [phone] 01/17/2018

## 2018-01-18 LAB — MULTIPLE MYELOMA PANEL, SERUM
Albumin SerPl Elph-Mcnc: 3.7 g/dL (ref 2.9–4.4)
Albumin/Glob SerPl: 1.3 (ref 0.7–1.7)
Alpha 1: 0.2 g/dL (ref 0.0–0.4)
Alpha2 Glob SerPl Elph-Mcnc: 0.5 g/dL (ref 0.4–1.0)
B-Globulin SerPl Elph-Mcnc: 1 g/dL (ref 0.7–1.3)
Gamma Glob SerPl Elph-Mcnc: 1.2 g/dL (ref 0.4–1.8)
Globulin, Total: 2.9 g/dL (ref 2.2–3.9)
IgA: 242 mg/dL (ref 61–437)
IgG (Immunoglobin G), Serum: 1357 mg/dL (ref 700–1600)
IgM (Immunoglobulin M), Srm: 30 mg/dL (ref 15–143)
M Protein SerPl Elph-Mcnc: 0.6 g/dL — ABNORMAL HIGH
Total Protein ELP: 6.6 g/dL (ref 6.0–8.5)

## 2018-02-08 DIAGNOSIS — G63 Polyneuropathy in diseases classified elsewhere: Secondary | ICD-10-CM | POA: Diagnosis not present

## 2018-02-08 DIAGNOSIS — D472 Monoclonal gammopathy: Secondary | ICD-10-CM | POA: Diagnosis not present

## 2018-02-14 ENCOUNTER — Encounter: Payer: Self-pay | Admitting: Podiatry

## 2018-02-14 ENCOUNTER — Ambulatory Visit (INDEPENDENT_AMBULATORY_CARE_PROVIDER_SITE_OTHER): Payer: Medicare Other | Admitting: Podiatry

## 2018-02-14 DIAGNOSIS — M79676 Pain in unspecified toe(s): Secondary | ICD-10-CM

## 2018-02-14 DIAGNOSIS — B351 Tinea unguium: Secondary | ICD-10-CM

## 2018-02-14 DIAGNOSIS — G629 Polyneuropathy, unspecified: Secondary | ICD-10-CM

## 2018-02-14 NOTE — Progress Notes (Signed)
Complaint:  Visit Type: Patient returns to my office for continued preventative foot care services. Complaint: Patient states" my nails have grown long and thick and become painful to walk and wear shoes" Patient has been previously treated for clavi/ulcer third toe left foot which has healed. The patient presents for preventative foot care services. No changes to ROS  Podiatric Exam: Vascular: dorsalis pedis and posterior tibial pulses are palpable bilateral. Capillary return is immediate. Temperature gradient is WNL. Skin turgor WNL  Sensorium: Normal Semmes Weinstein monofilament test. Normal tactile sensation bilaterally. Nail Exam: Pt has thick disfigured discolored nails with subungual debris noted bilateral entire nail hallux through fifth toenails Ulcer Exam: There is no evidence of ulcer or pre-ulcerative changes or infection. Orthopedic Exam: Muscle tone and strength are WNL. No limitations in general ROM. No crepitus or effusions noted. Foot type and digits show no abnormalities. Hallux malleus  B/L  Hammer toes  B/l. Skin: No Porokeratosis. No infection or ulcers.    Diagnosis:  Onychomycosis, , Pain in right toe, pain in left toes  Treatment & Plan Procedures and Treatment: Consent by patient was obtained for treatment procedures.   Debridement of mycotic and hypertrophic toenails, 1 through 5 bilateral and clearing of subungual debris. No ulceration, no infection noted.  Patient relates cramping big toe left foot. Return Visit-Office Procedure: Patient instructed to return to the office for a follow up visit 10 weeks  for continued evaluation and treatment.    Samarrah Tranchina DPM 

## 2018-02-23 DIAGNOSIS — L538 Other specified erythematous conditions: Secondary | ICD-10-CM | POA: Diagnosis not present

## 2018-02-23 DIAGNOSIS — L230 Allergic contact dermatitis due to metals: Secondary | ICD-10-CM | POA: Diagnosis not present

## 2018-02-23 DIAGNOSIS — C4441 Basal cell carcinoma of skin of scalp and neck: Secondary | ICD-10-CM | POA: Diagnosis not present

## 2018-02-23 DIAGNOSIS — L820 Inflamed seborrheic keratosis: Secondary | ICD-10-CM | POA: Diagnosis not present

## 2018-04-19 ENCOUNTER — Encounter: Payer: Self-pay | Admitting: Family Medicine

## 2018-04-19 ENCOUNTER — Other Ambulatory Visit: Payer: Self-pay | Admitting: Family Medicine

## 2018-04-19 ENCOUNTER — Ambulatory Visit (INDEPENDENT_AMBULATORY_CARE_PROVIDER_SITE_OTHER): Payer: Medicare Other | Admitting: Family Medicine

## 2018-04-19 VITALS — BP 114/67 | HR 61 | Temp 98.2°F | Resp 16 | Ht 75.0 in | Wt 200.6 lb

## 2018-04-19 DIAGNOSIS — R7309 Other abnormal glucose: Secondary | ICD-10-CM

## 2018-04-19 DIAGNOSIS — I350 Nonrheumatic aortic (valve) stenosis: Secondary | ICD-10-CM | POA: Diagnosis not present

## 2018-04-19 DIAGNOSIS — M159 Polyosteoarthritis, unspecified: Secondary | ICD-10-CM

## 2018-04-19 DIAGNOSIS — M150 Primary generalized (osteo)arthritis: Secondary | ICD-10-CM

## 2018-04-19 DIAGNOSIS — N3281 Overactive bladder: Secondary | ICD-10-CM

## 2018-04-19 DIAGNOSIS — N400 Enlarged prostate without lower urinary tract symptoms: Secondary | ICD-10-CM

## 2018-04-19 DIAGNOSIS — I10 Essential (primary) hypertension: Secondary | ICD-10-CM

## 2018-04-19 DIAGNOSIS — F418 Other specified anxiety disorders: Secondary | ICD-10-CM

## 2018-04-19 DIAGNOSIS — G609 Hereditary and idiopathic neuropathy, unspecified: Secondary | ICD-10-CM

## 2018-04-19 DIAGNOSIS — D472 Monoclonal gammopathy: Secondary | ICD-10-CM | POA: Diagnosis not present

## 2018-04-19 DIAGNOSIS — E559 Vitamin D deficiency, unspecified: Secondary | ICD-10-CM

## 2018-04-19 DIAGNOSIS — M8949 Other hypertrophic osteoarthropathy, multiple sites: Secondary | ICD-10-CM

## 2018-04-19 MED ORDER — LOSARTAN POTASSIUM 100 MG PO TABS: 100.0000 mg | ORAL_TABLET | Freq: Every day | ORAL | 1 refills | Status: DC

## 2018-04-19 NOTE — Assessment & Plan Note (Signed)
Mildly elevated initial BP, decline repeat manual, home readings are normal - with known white coat hypertension Most consistent with acute situational anxiety elevating BP No known complications     Plan:  1. Continue current BP regimen - Losartan 100mg  daily 2. Encourage improved lifestyle - low sodium diet, regular exercise 3. Continue monitor BP outside office, bring readings to next visit, if persistently >140/90 or new symptoms notify office sooner 4. Follow-up 6 months with labs

## 2018-04-19 NOTE — Patient Instructions (Addendum)
Thank you for coming to the office today.  Re ordered Losartan 100mg  daily - sent refill to [facility]  Ordered an ECHOcardiogram again - sent to Cardiologist at [facility] - they should contact you with scheduling this, if not heard back in 2 weeks then feel free to call them and arrange this test.  [facility]) [facility] [place], [place] [address] [address]: [phone]   - Follow-up with other specialists Urology, Neurology, Podiatry, Oncology  DUE for [place] (no food or drink after midnight before the lab appointment, only water or coffee without cream/sugar on the morning of)  SCHEDULE "Lab Only" visit in the morning at the clinic for lab draw in 6 MONTHS   - Make sure Lab Only appointment is at about 1 week before your next appointment, so that results will be available  For Lab Results, once available within 2-3 days of blood draw, you can can log in to MyChart online to view your results and a brief explanation. Also, we can discuss results at next follow-up visit.   Please schedule a Follow-up Appointment to: Return in about 6 months (around 10/18/2018) for Yearly Medicare Checkup.  If you have any other questions or concerns, please feel free to call the office or send a message through [facility]. You may also schedule an earlier appointment if necessary.  Additionally, you may be receiving a survey about your experience at our office within a few days to 1 week by e-mail or mail. We value your feedback.  [name], DO [place]

## 2018-04-19 NOTE — Progress Notes (Signed)
Subjective:    Patient ID: James Cardenas, male    DOB: 01-31-1941, 78 y.o.   MRN: [id number]  James Cardenas is a 78 y.o. male presenting on 04/19/2018 for Peripheral Neuropathy   HPI  FOLLOW-UPChronic Anxiety, Situational: Last visit 10/2017 see background information, at that time signed controlled non opioid agreement He is doing well on Alprazolam, has plenty left, expires 10/2018, rarely takes, he has never had dependence or withdrawal - see prior visit for other SSRI SNRI meds tried and failed w/ side effects, also with buspar - He only takes Alprazolam (Xanax) 0.68m BID PRN only situational anxiety, often he can go weeks without taking it, but certain circumstances, doctors visit dentist visit and certain trips he may need it. He was given prior rx 60 pills with 2 refills has lasted 1 year approximately. He describes panic type symptoms if this flares up. - He denies ever having any withdrawal from these medications before - Denies any depression or sad mood  Peripheral Neuropathy, Chronic Previously had vascular work up negative with imaging [facility], continued on Alpha Lipoic Acid and Vitamin D3 daily - Today he states now that symptoms of neuropathy have changed. No longer has the upper thigh sharper pain, and now he has more lower extremity symptoms of some numbness - Still has some difficulty with ambulation and walking. Also he admits history of used to play tennis but had L knee problem w/ arthritis in past, he has stopped tennis and this type of activity - He has had work-up from Vascular Specialist had Ultrasound and imaging and ultimately advised did not have vascular disease - He continues with current course, no other concerns at this time.  [facility] YTasia Catchings last seen 01/2016, advised for q 6 month lab monitoring surveillance for IgG Kappa MGUS as a pre-myeloma state.  Aortic Stenosis / regurgitation, Moderate / Heart  Murmur Last visit 10/2017 we discussed this chronic problem, see background note for last discussion. ECHOcardiogram was ordered but never scheduled due to a scheduling que error. However patient did not follow-up on this issue and he is interested to have it tested now. - this is a chronic problem - he denies any significant change or new symptoms - [facility] see prior results Denies shortness of breath, chest pain, dizziness, near syncope  CHRONIC HTN with white coat hypertension Home readings normal, he did not take Alprazolam today this morning before coming. He admits white coat hypertension usually feels anxious coming to doctor. Home readings normal 110-120/60-70s on avg reviewed home record, attributed to situational anxiety Taking Losartan 1073mdaily Denies CP, dyspnea, HA, edema, dizziness / lightheadedness  OAB / Urinary Urge Followed by [facility] - he was given Myrbetriq 2589maily worked well and then seemed to be less effective, then they have increased it up to 71m82mth better results again now he questions what to do next if this doesn't work, he has apt with them in follow-up  Health Maintenance: UTD Flu vaccine 03/2018  Depression screen PHQ [facility] 04/19/2018 04/19/2018 11/23/2017  Decreased Interest 0 0 0  Down, Depressed, Hopeless 0 0 0  PHQ - 2 Score 0 0 0  Altered sleeping 0 - -  Tired, decreased energy 0 - -  Change in appetite 0 - -  Feeling bad or failure about yourself  0 - -  Trouble concentrating 0 - -  Moving slowly or  fidgety/restless 0 - -  Suicidal thoughts 0 - -  PHQ-9 Score 0 - -  Difficult doing work/chores Not difficult at all - -   GAD 7 : Generalized Anxiety Score 04/16/2017 10/06/2016 07/02/2016  Nervous, Anxious, on Edge 0 1 1  Control/stop worrying 0 0 0  Worry too much - different things 0 0 0  Trouble relaxing 0 0 0  Restless 0 0 0  Easily annoyed or irritable - 0 0  Afraid - awful might happen 0 0 0  Total GAD 7 Score  - 1 1  Anxiety Difficulty Not difficult at all Not difficult at all Not difficult at all     Social History   Tobacco Use  . Smoking status: Never Smoker  . Smokeless tobacco: Never Used  Substance Use Topics  . Alcohol use: Yes    Alcohol/week: 8.0 standard drinks    Types: 7 Glasses of wine, 1 Cans of beer per week  . Drug use: No    Review of Systems Per HPI unless specifically indicated above     Objective:    BP 114/67 (BP Location: Left Arm, Cuff Size: Normal)   Pulse 61   Temp 98.2 F (36.8 C) (Oral)   Resp 16   Ht _0  (1.905 m)   Wt 200 lb 9.6 oz (91 kg)   BMI 25.07 kg/m   Wt Readings from Last 3 Encounters:  04/19/18 200 lb 9.6 oz (91 kg)  01/17/18 197 lb 11.2 oz (89.7 kg)  11/23/17 203 lb 9.6 oz (92.4 kg)    Physical Exam Vitals signs and nursing note reviewed.  Constitutional:      General: He is not in acute distress.    Appearance: He is well-developed. He is not diaphoretic.     Comments: Well-appearing, comfortable, cooperative  HENT:     Head: Normocephalic and atraumatic.  Eyes:     Conjunctiva/sclera: Conjunctivae normal.  Neck:     Musculoskeletal: Normal range of motion and neck supple.     Thyroid: No thyromegaly.     Comments: Bilateral Carotid bruit heard radiating from heart Cardiovascular:     Rate and Rhythm: Normal rate and regular rhythm.     Heart sounds: Murmur (2/6 systolic, increased over 2nd ICS space and radiates to carotids - stable from previous) present.  Pulmonary:     Effort: Pulmonary effort is normal. No respiratory distress.     Breath sounds: Normal breath sounds. No wheezing or rales.  Musculoskeletal: Normal range of motion.     Comments: Upper / Lower Extremities: - Normal muscle tone, strength bilateral upper extremities 5/5, lower extremities 5/5  Lymphadenopathy:     Cervical: No cervical adenopathy.  Skin:    General: Skin is warm and dry.     Findings: No erythema or rash.  Neurological:     Mental  Status: He is alert and oriented to person, place, and time.     Comments: Distal sensation intact to light touch all extremities  Psychiatric:        Behavior: Behavior normal.     Comments: Well groomed, good eye contact, normal speech and thoughts. Mildly anxious at baseline.    Results for orders placed or performed in visit on 01/17/18  Multiple Myeloma Panel (SPEP&IFE w/QIG)  Result Value Ref Range   IgG (Immunoglobin G), Serum 1,357 700 - 1,600 mg/dL   IgA 242 61 - 437 mg/dL   IgM (Immunoglobulin M), Srm 30 15 - 143 mg/dL  Total Protein ELP 6.6 6.0 - 8.5 g/dL   Albumin SerPl Elph-Mcnc 3.7 2.9 - 4.4 g/dL   Alpha 1 0.2 0.0 - 0.4 g/dL   Alpha2 Glob SerPl Elph-Mcnc 0.5 0.4 - 1.0 g/dL   B-Globulin SerPl Elph-Mcnc 1.0 0.7 - 1.3 g/dL   Gamma Glob SerPl Elph-Mcnc 1.2 0.4 - 1.8 g/dL   M Protein SerPl Elph-Mcnc 0.6 (H) Not Observed g/dL   Globulin, Total 2.9 2.2 - 3.9 g/dL   Albumin/Glob SerPl 1.3 0.7 - 1.7   IFE 1 Comment    Please Note Comment   Comprehensive metabolic panel  Result Value Ref Range   Sodium 136 135 - 145 mmol/L   Potassium 4.4 3.5 - 5.1 mmol/L   Chloride 103 98 - 111 mmol/L   CO2 27 22 - 32 mmol/L   Glucose, Bld 155 (H) 70 - 99 mg/dL   BUN 21 8 - 23 mg/dL   Creatinine, Ser 1.02 0.61 - 1.24 mg/dL   Calcium 9.1 8.9 - 10.3 mg/dL   Total Protein 7.1 6.5 - 8.1 g/dL   Albumin 4.1 3.5 - 5.0 g/dL   AST 28 15 - 41 U/L   ALT 21 0 - 44 U/L   Alkaline Phosphatase 75 38 - 126 U/L   Total Bilirubin 1.0 0.3 - 1.2 mg/dL   GFR calc non Af Amer >60 >60 mL/min   GFR calc Af Amer >60 >60 mL/min   Anion gap 6 5 - 15  CBC with Differential/Platelet  Result Value Ref Range   WBC 4.6 3.8 - 10.6 K/uL   RBC 4.17 (L) 4.40 - 5.90 MIL/uL   Hemoglobin 13.9 13.0 - 18.0 g/dL   HCT 40.9 40.0 - 52.0 %   MCV 98.0 80.0 - 100.0 fL   MCH 33.3 26.0 - 34.0 pg   MCHC 33.9 32.0 - 36.0 g/dL   RDW 13.5 11.5 - 14.5 %   Platelets 190 150 - 440 K/uL   Neutrophils Relative % 68 %   Neutro Abs  3.1 1.4 - 6.5 K/uL   Lymphocytes Relative 17 %   Lymphs Abs 0.8 (L) 1.0 - 3.6 K/uL   Monocytes Relative 8 %   Monocytes Absolute 0.3 0.2 - 1.0 K/uL   Eosinophils Relative 6 %   Eosinophils Absolute 0.3 0 - 0.7 K/uL   Basophils Relative 1 %   Basophils Absolute 0.0 0 - 0.1 K/uL      Assessment & Plan:   Problem List Items Addressed This Visit    Hypertension - Primary    Mildly elevated initial BP, decline repeat manual, home readings are normal - with known white coat hypertension Most consistent with acute situational anxiety elevating BP No known complications     Plan:  1. Continue current BP regimen - Losartan 142m daily 2. Encourage improved lifestyle - low sodium diet, regular exercise 3. Continue monitor BP outside office, bring readings to next visit, if persistently >140/90 or new symptoms notify office sooner 4. Follow-up 6 months with labs      Relevant Medications   losartan (COZAAR) 100 MG tablet   MGUS (monoclonal gammopathy of unknown significance)    [facility] YTasia CatchingsWith q 6 month lab surveillance      Moderate aortic stenosis    Stable without evidence of worsening symptoms clinically [facility]  He never had ECHO scheduled from 10/2017  Plan Re ordered ECHOcardiogram -  arranged to be at [facility] reading, and notified our referral coordinator, they will schedule this and confirm w/ patient now  Follow-up results of ECHO sooner, and may warrant referral to Cardiology for 2nd opinion if needed      Relevant Medications   losartan (COZAAR) 100 MG tablet   Other Relevant Orders   ECHOCARDIOGRAM COMPLETE   OAB (overactive bladder)    Remains improved on higher dose Myrbetriq 39m daily Follow-up as scheduled w/ Urology - may consider alternative options if needed      Peripheral neuropathy    Changed in neuropathy symptoms, without worsening Suspected  idiopathic vs hereditary etiology still, has ruled out several other possibilities [facility] Has had extensive work-up EMG, heavy metal, A1c, B12, TSH, other labs, SPEP/UPEP, vascular studies - Failed SNRI Duloxetine (side effects), Lexapro, supplements  Plan: 1. Continue current plan per Neurology with Alpha Lipoic Acid and Vitamin D - follow as scheduled      Situational anxiety    Stable without worsening Chronic situational anxiety with some panic symptoms rarely  Controlled on PRN Alprazolam (Xanax) only No co-morbid depression / mood disorder. - Failed: Prozac 20, Duloxetine 30, Lexapro 10 (side effects), Buspar 5 1-2 times daily (side effect/intolerance) - No prior Psych / counseling  Last controlled non opiate contract 10/2017. Reviewed PMP AWARE for past 2 years, appropriate  Plan: 1. Discussion on chronic use BDZ Alprazolam (Xanax) again and discussed potential risk for withdrawal and side effects or other complications - Agree to continue current Alprazolam 0.562mBID #60 +2 refills - lasting him up to 1 year - no new rx at this time, if needed he may notify our office, current rx will expire 10/2018 Follow-up 6 months         Meds ordered this encounter  Medications  . losartan (COZAAR) 100 MG tablet    Sig: Take 1 tablet (100 mg total) by mouth daily.    Dispense:  90 tablet    Refill:  1    Add refills   Orders Placed This Encounter  Procedures  . ECHOCARDIOGRAM COMPLETE    Standing Status:   Future    Standing Expiration Date:   07/19/2019    Order Specific Question:   Where should this test be performed    Answer:   [facility]    Order Specific Question:   Perflutren DEFINITY (image enhancing agent) should be administered unless hypersensitivity or allergy exist    Answer:   Administer Perflutren    Order Specific Question:   Reason for exam-Echo    Answer:   Aortic Stenosis 424.1 / 135.0    Order Specific Question:   Reason for exam-Echo     Answer:   Murmur  785.2 / R01.1    Follow up plan: Return in about 6 months (around 10/18/2018) for Yearly Medicare Checkup.  Future labs ordered for 10/2018 - only A1c, Lipid, PSA, Lipid - other labs drawn by Heme/Onc and other specialists  [facility] 04/19/2018, 11:17 AM

## 2018-04-19 NOTE — Assessment & Plan Note (Signed)
Remains improved on higher dose Myrbetriq 50mg  daily Follow-up as scheduled w/ Urology - may consider alternative options if needed

## 2018-04-19 NOTE — Assessment & Plan Note (Signed)
Stable without evidence of worsening symptoms clinically Previously established on ECHO 2014 per [facility] Dr Ubaldo Glassing with last ECHO  He never had ECHO scheduled from 10/2017  Plan Re ordered ECHOcardiogram - arranged to be at [facility] reading, and notified our referral coordinator, they will schedule this and confirm w/ patient now  Follow-up results of ECHO sooner, and may warrant referral to Cardiology for 2nd opinion if needed

## 2018-04-19 NOTE — Assessment & Plan Note (Signed)
Changed in neuropathy symptoms, without worsening Suspected idiopathic vs hereditary etiology still, has ruled out several other possibilities Followed by [facility] Has had extensive work-up EMG, heavy metal, A1c, B12, TSH, other labs, SPEP/UPEP, vascular studies - Failed SNRI Duloxetine (side effects), Lexapro, supplements  Plan: 1. Continue current plan per Neurology with Alpha Lipoic Acid and Vitamin D - follow as scheduled 

## 2018-04-19 NOTE — Assessment & Plan Note (Signed)
Followed by [facility]/Onc Dr Yu With q 6 month lab surveillance 

## 2018-04-19 NOTE — Assessment & Plan Note (Signed)
Stable without worsening Chronic situational anxiety with some panic symptoms rarely  Controlled on PRN Alprazolam (Xanax) only No co-morbid depression / mood disorder. - Failed: Prozac 20, Duloxetine 30, Lexapro 10 (side effects), Buspar 5 1-2 times daily (side effect/intolerance) - No prior Psych / counseling  Last controlled non opiate contract 10/2017. Reviewed PMP AWARE for past 2 years, appropriate  Plan: 1. Discussion on chronic use BDZ Alprazolam (Xanax) again and discussed potential risk for withdrawal and side effects or other complications - Agree to continue current Alprazolam 0.5mg  BID #60 +2 refills - lasting him up to 1 year - no new rx at this time, if needed he may notify our office, current rx will expire 10/2018 Follow-up 6 months

## 2018-04-29 ENCOUNTER — Ambulatory Visit
Admission: RE | Admit: 2018-04-29 | Discharge: 2018-04-29 | Disposition: A | Discharge status: Home / Self Care | Payer: Medicare Other | Source: Ambulatory Visit | Attending: Family Medicine | Admitting: Family Medicine

## 2018-04-29 DIAGNOSIS — I350 Nonrheumatic aortic (valve) stenosis: Secondary | ICD-10-CM | POA: Insufficient documentation

## 2018-04-29 DIAGNOSIS — I10 Essential (primary) hypertension: Secondary | ICD-10-CM | POA: Diagnosis not present

## 2018-04-29 DIAGNOSIS — I272 Pulmonary hypertension, unspecified: Secondary | ICD-10-CM | POA: Diagnosis not present

## 2018-04-29 DIAGNOSIS — F419 Anxiety disorder, unspecified: Secondary | ICD-10-CM | POA: Diagnosis not present

## 2018-04-29 DIAGNOSIS — I351 Nonrheumatic aortic (valve) insufficiency: Secondary | ICD-10-CM | POA: Diagnosis not present

## 2018-04-29 NOTE — Progress Notes (Signed)
*  PRELIMINARY RESULTS* Echocardiogram 2D Echocardiogram has been performed.  James Cardenas 04/29/2018, 10:30 AM

## 2018-05-02 ENCOUNTER — Encounter: Payer: Self-pay | Admitting: Podiatry

## 2018-05-02 ENCOUNTER — Ambulatory Visit (INDEPENDENT_AMBULATORY_CARE_PROVIDER_SITE_OTHER): Payer: Medicare Other | Admitting: Podiatry

## 2018-05-02 ENCOUNTER — Encounter: Payer: Self-pay | Admitting: Family Medicine

## 2018-05-02 ENCOUNTER — Telehealth: Payer: Self-pay | Admitting: Family Medicine

## 2018-05-02 DIAGNOSIS — I272 Pulmonary hypertension, unspecified: Secondary | ICD-10-CM | POA: Insufficient documentation

## 2018-05-02 DIAGNOSIS — B351 Tinea unguium: Secondary | ICD-10-CM

## 2018-05-02 DIAGNOSIS — I350 Nonrheumatic aortic (valve) stenosis: Secondary | ICD-10-CM

## 2018-05-02 DIAGNOSIS — M79676 Pain in unspecified toe(s): Secondary | ICD-10-CM | POA: Diagnosis not present

## 2018-05-02 DIAGNOSIS — I119 Hypertensive heart disease without heart failure: Secondary | ICD-10-CM | POA: Insufficient documentation

## 2018-05-02 DIAGNOSIS — G629 Polyneuropathy, unspecified: Secondary | ICD-10-CM

## 2018-05-02 DIAGNOSIS — I351 Nonrheumatic aortic (valve) insufficiency: Secondary | ICD-10-CM

## 2018-05-02 NOTE — Telephone Encounter (Signed)
Patient was notified of ECHOcardiogram results, see result note for further details. Briefly with moderate-severe aortic stenosis, I have recommended referral to [facility] for 2nd opinion and he agrees, will place referral now.  [name], [facility] 05/02/2018, 3:28 PM

## 2018-05-02 NOTE — Progress Notes (Signed)
Complaint:  Visit Type: Patient returns to my office for continued preventative foot care services. Complaint: Patient states" my nails have grown long and thick and become painful to walk and wear shoes" Patient has been previously treated for clavi/ulcer third toe left foot which has healed. The patient presents for preventative foot care services. No changes to ROS  Podiatric Exam: Vascular: dorsalis pedis and posterior tibial pulses are palpable bilateral. Capillary return is immediate. Temperature gradient is WNL. Skin turgor WNL  Sensorium: Normal Semmes Weinstein monofilament test. Normal tactile sensation bilaterally. Nail Exam: Pt has thick disfigured discolored nails with subungual debris noted bilateral entire nail hallux through fifth toenails Ulcer Exam: There is no evidence of ulcer or pre-ulcerative changes or infection. Orthopedic Exam: Muscle tone and strength are WNL. No limitations in general ROM. No crepitus or effusions noted. Foot type and digits show no abnormalities. Hallux malleus  B/L  Hammer toes  B/l. Skin: No Porokeratosis. No infection or ulcers.    Diagnosis:  Onychomycosis, , Pain in right toe, pain in left toes  Treatment & Plan Procedures and Treatment: Consent by patient was obtained for treatment procedures.   Debridement of mycotic and hypertrophic toenails, 1 through 5 bilateral and clearing of subungual debris. No ulceration, no infection noted.  Patient relates cramping big toe left foot. Return Visit-Office Procedure: Patient instructed to return to the office for a follow up visit 10 weeks  for continued evaluation and treatment.    Gardiner Barefoot DPM

## 2018-05-11 DIAGNOSIS — H903 Sensorineural hearing loss, bilateral: Secondary | ICD-10-CM | POA: Diagnosis not present

## 2018-05-11 DIAGNOSIS — H6980 Other specified disorders of Eustachian tube, unspecified ear: Secondary | ICD-10-CM | POA: Diagnosis not present

## 2018-05-21 NOTE — Progress Notes (Signed)
05/23/2018  2:37 PM   James Cardenas 02/09/1941 [id number]  Referring provider: Olin Hauser, DO [address] [place], [place] [address]  Chief Complaint  Patient presents with  . Follow-up    HPI: James Cardenas is a 78 y.o. male who presents today for the care and management of BPH with lower urinary tract symptoms and erectile dysfunction.  He was previously seen at this practice by Dr. Pilar Jarvis, and had success with Myrbetriq 50 mg and sildenafil.    Patient reports that the Myrbetriq is currently working well; his IPSS today is 8/2-3.  Patient was concerned if any of his medications conflicted with Myrbetriq, and about side effects.  He notes he has had constipation more regularly than he used to, once or twice a week, as well as back aches.    Patient admits that the sildenafil is inconsistently effective and wondered if there might be an alternative.  IPSS    Row Name 05/23/18 1400         International Prostate Symptom Score   How often have you had the sensation of not emptying your bladder?  Less than 1 in 5     How often have you had to urinate less than every two hours?  About half the time     How often have you found you stopped and started again several times when you urinated?  Less than 1 in 5 times     How often have you found it difficult to postpone urination?  Less than 1 in 5 times     How often have you had a weak urinary stream?  Less than 1 in 5 times     How often have you had to strain to start urination?  Not at All     How many times did you typically get up at night to urinate?  1 Time     Total IPSS Score  8       Quality of Life due to urinary symptoms   If you were to spend the rest of your life with your urinary condition just the way it is now how would you feel about that?  Mixed        Score:  1-7 Mild 8-19 Moderate 20-35 Severe   PMH: Past Medical History:  Diagnosis Date  . Anxiety   . Arthritis of knee   . Hypertension     . Murmur, cardiac   . Neuropathy     Surgical History: Past Surgical History:  Procedure Laterality Date  . NECK SURGERY  2011    Home Medications:  Allergies as of 05/23/2018   No Known Allergies     Medication List       Accurate as of May 23, 2018  2:37 PM. Always use your most recent med list.        Acetylcarn-Alpha Lipoic Acid 400-200 MG Caps Take by mouth daily.   ALPRAZolam 0.5 MG tablet Commonly known as:  XANAX TAKE 1 TABLET BY MOUTH 2 TIMES A DAY AS NEEDED FOR ANXIETY   aspirin EC 81 MG tablet Take 81 mg by mouth daily.   Azelaic Acid 15 % cream APPLY TOPICALLY TO THE AFFECTED AREA OF THE FACE EVERY DAY   BLUE-EMU MAXIMUM STRENGTH EX Apply topically.   clindamycin 1 % gel Commonly known as:  CLINDAGEL APPLY TWICE A DAY AS DIRECTED   fluticasone 50 MCG/ACT nasal spray Commonly known as:  FLONASE SPRAY 2  SPRAYS INTO EACH NOSTRIL EVERY DAY   glucosamine-chondroitin 500-400 MG tablet Take 1 tablet by mouth 3 (three) times daily.   ibuprofen 200 MG tablet Commonly known as:  ADVIL,MOTRIN Take 200 mg by mouth every 6 (six) hours as needed.   losartan 100 MG tablet Commonly known as:  COZAAR Take 1 tablet (100 mg total) by mouth daily.   mirabegron ER 50 MG Tb24 tablet Commonly known as:  MYRBETRIQ Take 1 tablet (50 mg total) by mouth daily.   MIRALAX PO Take by mouth as needed.   MULTI-VITAMINS Tabs Take by mouth.   Omega-3 1000 MG Caps Take by mouth.   sildenafil 20 MG tablet Commonly known as:  REVATIO Take up to 5 pills about 30 min prior to sex   tretinoin 0.1 % cream Commonly known as:  RETIN-A APPLY A PEA SIZE AMOUNT TO FACE NIGHTLY       Allergies: No Known Allergies  Family History: Family History  Problem Relation Age of Onset  . Thyroid disease Mother   . Lung cancer Father   . Pancreatic cancer Brother   . Alzheimer's disease Sister   . Prostate cancer Neg Hx   . Bladder Cancer Neg Hx   . Kidney cancer  Neg Hx     Social History:  reports that he has never smoked. He has never used smokeless tobacco. He reports current alcohol use of about 8.0 standard drinks of alcohol per week. He reports that he does not use drugs.  ROS: UROLOGY Frequent Urination?: Yes Hard to postpone urination?: No Burning/pain with urination?: No Get up at night to urinate?: Yes Leakage of urine?: No Urine stream starts and stops?: No Trouble starting stream?: No Do you have to strain to urinate?: No Blood in urine?: No Urinary tract infection?: No Sexually transmitted disease?: No Injury to kidneys or bladder?: No Painful intercourse?: No Weak stream?: No Erection problems?: Yes Penile pain?: No  Gastrointestinal Nausea?: No Vomiting?: No Indigestion/heartburn?: No Diarrhea?: No Constipation?: Yes  Constitutional Fever: No Night sweats?: No Weight loss?: No Fatigue?: No  Skin Skin rash/lesions?: No Itching?: No  Eyes Blurred vision?: No Double vision?: No  Ears/Nose/Throat Sore throat?: No Sinus problems?: Yes  Hematologic/Lymphatic Swollen glands?: No Easy bruising?: No  Cardiovascular Leg swelling?: No Chest pain?: No  Respiratory Cough?: No Shortness of breath?: No  Endocrine Excessive thirst?: No  Musculoskeletal Back pain?: No Joint pain?: No  Neurological Headaches?: No Dizziness?: No  Psychologic Depression?: No Anxiety?: No  Physical Exam: BP 135/75 (BP Location: Left Arm, Patient Position: Sitting, Cuff Size: Normal)   Pulse 64   Ht 6\' 3"  (1.905 m)   Wt 194 lb (88 kg)   BMI 24.25 kg/m   Constitutional:  Well nourished. Alert and oriented, No acute distress. Cardiovascular: No clubbing, cyanosis, or edema. Respiratory: Normal respiratory effort, no increased work of breathing. Skin: No rashes, bruises or suspicious lesions. Neurologic: Grossly intact, no focal deficits, moving all 4 extremities. Psychiatric: Normal mood and affect.  Laboratory  Data: Lab Results  Component Value Date   WBC 4.6 01/17/2018   HGB 13.9 01/17/2018   HCT 40.9 01/17/2018   MCV 98.0 01/17/2018   PLT 190 01/17/2018   Lab Results  Component Value Date   CREATININE 1.02 01/17/2018   Lab Results  Component Value Date   HGBA1C 5.5 10/12/2017   Assessment & Plan:    1. Overactive bladder - Continue Myrbetriq 50 mg daily  2. Erectile dysfunction - Continue sildenafil;  he was informed that PDE 5 inhibitors across the board are equally effective and this would be his least costly medication for ED  Return in about 1 year (around 05/24/2019) for Follow up.  Abbie Sons, [place] [address], [place], [place] [address] [phone]  I, Adele Schilder, am acting as a scribe for John Giovanni, MD.    I, Abbie Sons, MD, have reviewed all documentation for this visit. The documentation on 05/23/18 for the exam, diagnosis, procedures, and orders are all accurate and complete.

## 2018-05-23 ENCOUNTER — Encounter: Payer: Self-pay | Admitting: Urology

## 2018-05-23 ENCOUNTER — Ambulatory Visit (INDEPENDENT_AMBULATORY_CARE_PROVIDER_SITE_OTHER): Payer: Medicare Other | Admitting: Urology

## 2018-05-23 VITALS — BP 135/75 | HR 64 | Ht 75.0 in | Wt 194.0 lb

## 2018-05-23 DIAGNOSIS — N3281 Overactive bladder: Secondary | ICD-10-CM | POA: Diagnosis not present

## 2018-05-23 DIAGNOSIS — N401 Enlarged prostate with lower urinary tract symptoms: Secondary | ICD-10-CM | POA: Diagnosis not present

## 2018-05-23 DIAGNOSIS — N529 Male erectile dysfunction, unspecified: Secondary | ICD-10-CM

## 2018-05-23 NOTE — Patient Instructions (Signed)
Benign Prostatic Hyperplasia ° °Benign prostatic hyperplasia (BPH) is an enlarged prostate gland that is caused by the normal aging process and not by cancer. The prostate is a walnut-sized gland that is involved in the production of semen. It is located in front of the rectum and below the bladder. The bladder stores urine and the urethra is the tube that carries the urine out of the body. The prostate may get bigger as a man gets older. °An enlarged prostate can press on the urethra. This can make it harder to pass urine. The build-up of urine in the bladder can cause infection. Back pressure and infection may progress to bladder damage and kidney (renal) failure. °What are the causes? °This condition is part of a normal aging process. However, not all men develop problems from this condition. If the prostate enlarges away from the urethra, urine flow will not be blocked. If it enlarges toward the urethra and compresses it, there will be problems passing urine. °What increases the risk? °This condition is more likely to develop in men over the age of 50 years. °What are the signs or symptoms? °Symptoms of this condition include: °· Getting up often during the night to urinate. °· Needing to urinate frequently during the day. °· Difficulty starting urine flow. °· Decrease in size and strength of your urine stream. °· Leaking (dribbling) after urinating. °· Inability to pass urine. This needs immediate treatment. °· Inability to completely empty your bladder. °· Pain when you pass urine. This is more common if there is also an infection. °· Urinary tract infection (UTI). °How is this diagnosed? °This condition is diagnosed based on your medical history, a physical exam, and your symptoms. Tests will also be done, such as: °· A post-void bladder scan. This measures any amount of urine that may remain in your bladder after you finish urinating. °· A digital rectal exam. In a rectal exam, your health care provider  checks your prostate by putting a lubricated, gloved finger into your rectum to feel the back of your prostate gland. This exam detects the size of your gland and any abnormal lumps or growths. °· An exam of your urine (urinalysis). °· A prostate specific antigen (PSA) screening. This is a blood test used to screen for prostate cancer. °· An ultrasound. This test uses sound waves to electronically produce a picture of your prostate gland. °Your health care provider may refer you to a specialist in kidney and prostate diseases (urologist). °How is this treated? °Once symptoms begin, your health care provider will monitor your condition (active surveillance or watchful waiting). Treatment for this condition will depend on the severity of your condition. Treatment may include: °· Observation and yearly exams. This may be the only treatment needed if your condition and symptoms are mild. °· Medicines to relieve your symptoms, including: °? Medicines to shrink the prostate. °? Medicines to relax the muscle of the prostate. °· Surgery in severe cases. Surgery may include: °? Prostatectomy. In this procedure, the prostate tissue is removed completely through an open incision or with a laparascope or robotics. °? Transurethral resection of the prostate (TURP). In this procedure, a tool is inserted through the opening at the tip of the penis (urethra). It is used to cut away tissue of the inner core of the prostate. The pieces are removed through the same opening of the penis. This removes the blockage. °? Transurethral incision (TUIP). In this procedure, small cuts are made in the prostate. This lessens   the prostate's pressure on the urethra. ? Transurethral microwave thermotherapy (TUMT). This procedure uses microwaves to create heat. The heat destroys and removes a small amount of prostate tissue. ? Transurethral needle ablation (TUNA). This procedure uses radio frequencies to destroy and remove a small amount of  prostate tissue. ? Interstitial laser coagulation ([place]). This procedure uses a laser to destroy and remove a small amount of prostate tissue. ? Transurethral electrovaporization (TUVP). This procedure uses electrodes to destroy and remove a small amount of prostate tissue. ? Prostatic urethral lift. This procedure inserts an implant to push the lobes of the prostate away from the urethra. Follow these instructions at home:  Take over-the-counter and prescription medicines only as told by your health care provider.  Monitor your symptoms for any changes. Contact your health care provider with any changes.  Avoid drinking large amounts of liquid before going to bed or out in public.  Avoid or reduce how much caffeine or alcohol you drink.  Give yourself time when you urinate.  Keep all follow-up visits as told by your health care provider. This is important. Contact a health care provider if:  You have unexplained back pain.  Your symptoms do not get better with treatment.  You develop side effects from the medicine you are taking.  Your urine becomes very dark or has a bad smell.  Your lower abdomen becomes distended and you have trouble passing your urine. Get help right away if:  You have a fever or chills.  You suddenly cannot urinate.  You feel lightheaded, or very dizzy, or you faint.  There are large amounts of blood or clots in the urine.  Your urinary problems become hard to manage.  You develop moderate to severe low back or flank pain. The flank is the side of your body between the ribs and the hip. These symptoms may represent a serious problem that is an emergency. Do not wait to see if the symptoms will go away. Get medical help right away. Call your local emergency services (911 in the [place] Do not drive yourself to the hospital. Summary  Benign prostatic hyperplasia (BPH) is an enlarged prostate that is caused by the normal aging process and not by  cancer.  An enlarged prostate can press on the urethra. This can make it hard to pass urine.  This condition is part of a normal aging process and is more likely to develop in men over the age of 55 years.  Get help right away if you suddenly cannot urinate. This information is not intended to replace advice given to you by your health care provider. Make sure you discuss any questions you have with your health care provider. Document Released: 03/30/2005 Document Revised: 05/04/2016 Document Reviewed: 05/04/2016 Elsevier Interactive Patient Education  2019 [employer] Overactive Bladder, Adult  Overactive bladder refers to a condition in which a person has a sudden need to pass urine. The person may leak urine if he or she cannot get to the bathroom fast enough (urinary incontinence). A person with this condition may also wake up several times in the night to go to the bathroom. Overactive bladder is associated with poor nerve signals between your bladder and your brain. Your bladder may get the signal to empty before it is full. You may also have very sensitive muscles that make your bladder squeeze too soon. These symptoms might interfere with daily work or social activities. What are the causes? This condition may be associated with or  caused by:  Urinary tract infection.  Infection of nearby tissues, such as the prostate.  Prostate enlargement.  Surgery on the uterus or urethra.  Bladder stones, inflammation, or tumors.  Drinking too much caffeine or alcohol.  Certain medicines, especially medicines that get rid of extra fluid in the body (diuretics).  Muscle or nerve weakness, especially from: ? A spinal cord injury. ? Stroke. ? Multiple sclerosis. ? Parkinson's disease.  Diabetes.  Constipation. What increases the risk? You may be at greater risk for overactive bladder if you:  Are an older adult.  Smoke.  Are going through menopause.  Have prostate  problems.  Have a neurological disease, such as stroke, dementia, Parkinson's disease, or multiple sclerosis (MS).  Eat or drink things that irritate the bladder. These include alcohol, spicy food, and caffeine.  Are overweight or obese. What are the signs or symptoms? Symptoms of this condition include:  Sudden, strong urge to urinate.  Leaking urine.  Urinating 8 or more times a day.  Waking up to urinate 2 or more times a night. How is this diagnosed? Your health care provider may suspect overactive bladder based on your symptoms. He or she will diagnose this condition by:  A physical exam and medical history.  Blood or urine tests. You might need bladder or urine tests to help determine what is causing your overactive bladder. You might also need to see a health care provider who specializes in urinary tract problems (urologist). How is this treated? Treatment for overactive bladder depends on the cause of your condition and whether it is mild or severe. You can also make lifestyle changes at home. Options include:  Bladder training. This may include: ? Learning to control the urge to urinate by following a schedule that directs you to urinate at regular intervals (timed voiding). ? Doing Kegel exercises to strengthen your pelvic floor muscles, which support your bladder. Toning these muscles can help you control urination, even if your bladder muscles are overactive.  Special devices. This may include: ? Biofeedback, which uses sensors to help you become aware of your body's signals. ? Electrical stimulation, which uses electrodes placed inside the body (implanted) or outside the body. These electrodes send gentle pulses of electricity to strengthen the nerves or muscles that control the bladder. ? Women may use a plastic device that fits into the vagina and supports the bladder (pessary).  Medicines. ? Antibiotics to treat bladder infection. ? Antispasmodics to stop the  bladder from releasing urine at the wrong time. ? Tricyclic antidepressants to relax bladder muscles. ? Injections of botulinum toxin type A directly into the bladder tissue to relax bladder muscles.  Lifestyle changes. This may include: ? Weight loss. Talk to your health care provider about weight loss methods that would work best for you. ? Diet changes. This may include reducing how much alcohol and caffeine you consume, or drinking fluids at different times of the day. ? Not smoking. Do not use any products that contain nicotine or tobacco, such as cigarettes and e-cigarettes. If you need help quitting, ask your health care provider.  Surgery. ? A device may be implanted to help manage the nerve signals that control urination. ? An electrode may be implanted to stimulate electrical signals in the bladder. ? A procedure may be done to change the shape of the bladder. This is done only in very severe cases. Follow these instructions at home: Lifestyle  Make any diet or lifestyle changes that are recommended by  your health care provider. These may include: ? Drinking less fluid or drinking fluids at different times of the day. ? Cutting down on caffeine or alcohol. ? Doing Kegel exercises. ? Losing weight if needed. ? Eating a healthy and balanced diet to prevent constipation. This may include:  Eating foods that are high in fiber, such as fresh fruits and vegetables, whole grains, and beans.  Limiting foods that are high in fat and processed sugars, such as fried and sweet foods. General instructions  Take over-the-counter and prescription medicines only as told by your health care provider.  If you were prescribed an antibiotic medicine, take it as told by your health care provider. Do not stop taking the antibiotic even if you start to feel better.  Use any implants or pessary as told by your health care provider.  If needed, wear pads to absorb urine leakage.  Keep a journal  or log to track how much and when you drink and when you feel the need to urinate. This will help your health care provider monitor your condition.  Keep all follow-up visits as told by your health care provider. This is important. Contact a health care provider if:  You have a fever.  Your symptoms do not get better with treatment.  Your pain and discomfort get worse.  You have more frequent urges to urinate. Get help right away if:  You are not able to control your bladder. Summary  Overactive bladder refers to a condition in which a person has a sudden need to pass urine.  Several conditions may lead to an overactive bladder.  Treatment for overactive bladder depends on the cause and severity of your condition.  Follow your health care provider's instructions about lifestyle changes, doing Kegel exercises, keeping a journal, and taking medicines. This information is not intended to replace advice given to you by your health care provider. Make sure you discuss any questions you have with your health care provider. Document Released: 01/24/2009 Document Revised: 04/15/2017 Document Reviewed: 04/15/2017 Elsevier Interactive Patient Education  2019 [employer].

## 2018-05-24 ENCOUNTER — Ambulatory Visit: Payer: Medicare Other | Admitting: Urology

## 2018-06-21 DIAGNOSIS — H26493 Other secondary cataract, bilateral: Secondary | ICD-10-CM | POA: Diagnosis not present

## 2018-06-28 DIAGNOSIS — I340 Nonrheumatic mitral (valve) insufficiency: Secondary | ICD-10-CM | POA: Diagnosis not present

## 2018-06-28 DIAGNOSIS — I2510 Atherosclerotic heart disease of native coronary artery without angina pectoris: Secondary | ICD-10-CM | POA: Diagnosis not present

## 2018-06-28 DIAGNOSIS — R9431 Abnormal electrocardiogram [ECG] [EKG]: Secondary | ICD-10-CM | POA: Diagnosis not present

## 2018-06-28 DIAGNOSIS — R071 Chest pain on breathing: Secondary | ICD-10-CM | POA: Diagnosis not present

## 2018-06-28 DIAGNOSIS — I10 Essential (primary) hypertension: Secondary | ICD-10-CM | POA: Diagnosis not present

## 2018-06-28 DIAGNOSIS — I350 Nonrheumatic aortic (valve) stenosis: Secondary | ICD-10-CM | POA: Diagnosis not present

## 2018-06-28 DIAGNOSIS — E782 Mixed hyperlipidemia: Secondary | ICD-10-CM | POA: Diagnosis not present

## 2018-06-28 DIAGNOSIS — I493 Ventricular premature depolarization: Secondary | ICD-10-CM | POA: Diagnosis not present

## 2018-06-28 DIAGNOSIS — I351 Nonrheumatic aortic (valve) insufficiency: Secondary | ICD-10-CM | POA: Diagnosis not present

## 2018-06-30 ENCOUNTER — Encounter: Payer: Self-pay | Admitting: Cardiovascular Disease

## 2018-06-30 DIAGNOSIS — R079 Chest pain, unspecified: Secondary | ICD-10-CM | POA: Diagnosis not present

## 2018-07-11 ENCOUNTER — Telehealth: Payer: Self-pay | Admitting: Cardiovascular Disease

## 2018-07-11 ENCOUNTER — Ambulatory Visit: Payer: 59 | Admitting: Podiatry

## 2018-07-11 NOTE — Telephone Encounter (Signed)
Left voicemail message for patient to call back for screening questions and review of appointment information.

## 2018-07-12 NOTE — Telephone Encounter (Signed)
Spoke with patient regarding current situation and needing to switch to a virtual visit with either telephone or video. He verbalized understanding and was agreeable with video visit if provider sends him text with link to access. He spoke at length about his previous testing, results, and options for potential surgery. So he reports that he has seen doctor at [facility] and also at [facility] He states that at [facility] he had a stress test and EKG done and would like [place] to have those records for provider to review. Advised that we would try to get those for the provider to review if possible. Reviewed that Dr. Rockey Situ would review all of his testing, history, and would discuss this with him at visit. We were on the phone for 37 minutes. Obtained verbal consent for the video visit and he had no further questions.   YOUR CARDIOLOGY TEAM HAS ARRANGED FOR AN E-VISIT FOR YOUR APPOINTMENT - PLEASE REVIEW IMPORTANT INFORMATION BELOW SEVERAL DAYS PRIOR TO YOUR APPOINTMENT  Due to the recent COVID-19 pandemic, we are transitioning in-person office visits to tele-medicine visits in an effort to decrease unnecessary exposure to our patients and staff. Medicare and most insurances are covering these visits without a copay needed. We also encourage you to sign up for MyChart if you have not already done so. You will need a smartphone if possible. For patients that do not have this, we can still complete the visit using a regular telephone but do prefer a smartphone to enable video when possible. You may have a close family member that lives with you that can help. If possible, we also ask that you have a blood pressure cuff and scale at home to measure your blood pressure, heart rate and weight prior to your scheduled appointment. Patients with clinical needs that need an in-person evaluation and testing will still be able to come to the office if absolutely necessary. If you have any questions, feel free to call our office.  2-3  DAYS BEFORE YOUR APPOINTMENT  You will receive a telephone call from one of our [place] team members - your caller ID may say "Unknown caller." If this is a video visit, we will confirm that you have been able to download the WebEx app. We will remind you check your blood pressure, heart rate and weight prior to your scheduled appointment. If you have an Apple Watch or Kardia, please upload any pertinent ECG strips the day before or morning of your appointment to [place]. Our staff will also make sure you have reviewed the consent and agree to move forward with your scheduled tele-health visit.     THE DAY OF YOUR APPOINTMENT  Approximately 15 minutes prior to your scheduled appointment, you will receive a telephone call from one of [place] team - your caller ID may say "Unknown caller."  Our staff will confirm medications, vital signs for the day and any symptoms you may be experiencing. Please have this information available prior to the time of visit start. It may also be helpful for you to have a pad of paper and pen handy for any instructions given during your visit. They will also walk you through joining the WebEx smartphone meeting if this is a video visit.    CONSENT FOR TELE-HEALTH VISIT - PLEASE RVIEW  I hereby voluntarily request, consent and authorize [facility] and its employed or contracted physicians, physician assistants, nurse practitioners or other licensed health care professionals (the Practitioner), to provide me with telemedicine health care  services (the "Services") as deemed necessary by the treating Practitioner. I acknowledge and consent to receive the Services by the Practitioner via telemedicine. I understand that the telemedicine visit will involve communicating with the Practitioner through live audiovisual communication technology and the disclosure of certain medical information by electronic transmission. I acknowledge that I have been given the opportunity to  request an in-person assessment or other available alternative prior to the telemedicine visit and am voluntarily participating in the telemedicine visit.  I understand that I have the right to withhold or withdraw my consent to the use of telemedicine in the course of my care at any time, without affecting my right to future care or treatment, and that the Practitioner or I may terminate the telemedicine visit at any time. I understand that I have the right to inspect all information obtained and/or recorded in the course of the telemedicine visit and may receive copies of available information for a reasonable fee.  I understand that some of the potential risks of receiving the Services via telemedicine include:  [place] Kitchen Delay or interruption in medical evaluation due to technological equipment failure or disruption; . Information transmitted may not be sufficient (e.g. poor resolution of images) to allow for appropriate medical decision making by the Practitioner; and/or  . In rare instances, security protocols could fail, causing a breach of personal health information.  Furthermore, I acknowledge that it is my responsibility to provide information about my medical history, conditions and care that is complete and accurate to the best of my ability. I acknowledge that Practitioner's advice, recommendations, and/or decision may be based on factors not within their control, such as incomplete or inaccurate data provided by me or distortions of diagnostic images or specimens that may result from electronic transmissions. I understand that the practice of medicine is not an exact science and that Practitioner makes no warranties or guarantees regarding treatment outcomes. I acknowledge that I will receive a copy of this consent concurrently upon execution via email to the email address I last provided but may also request a printed copy by calling the office of [facility].    I understand that my insurance  will be billed for this visit.   I have read or had this consent read to me. . I understand the contents of this consent, which adequately explains the benefits and risks of the Services being provided via telemedicine.  . I have been provided ample opportunity to ask questions regarding this consent and the Services and have had my questions answered to my satisfaction. . I give my informed consent for the services to be provided through the use of telemedicine in my medical care  By participating in this telemedicine visit I agree to the above.

## 2018-07-15 NOTE — Progress Notes (Addendum)
Virtual Visit via Video Note   This visit type was conducted due to national recommendations for restrictions regarding the COVID-19 Pandemic (e.g. social distancing) in an effort to limit this patient's exposure and mitigate transmission in our community.  Due to her co-morbid illnesses, this patient is at least at moderate risk for complications without adequate follow up.  This format is felt to be most appropriate for this patient at this time.  All issues noted in this document were discussed and addressed.  A limited physical exam was performed with this format.  Please refer to the patient's chart for her consent to telehealth for [facility].    Date:  07/18/2018   ID:  James Cardenas, DOB Mar 06, 1941, MRN [id number]  Patient Location:  [address] [address]   Provider location:   [facility], [place] office  PCP:  Olin Hauser, DO  Cardiologist:  Arvid Right [facility]  Chief Complaint: Severe aortic valve stenosis, chest tightness  New Patient  History of Present Illness:    James Cardenas is a 78 y.o. male who presents via audio/video conferencing for a telehealth visit today.   The patient does not symptoms concerning for COVID-19 infection (fever, chills, cough, or new SHORTNESS OF BREATH).   Patient has a past medical history of Aortic valve stenosis , severe Anxiety GERD Essential hypertension Overactive bladder Peripheral neuropathy MGUS (monoclonal gammopathy of unknown significance) Who presents by referral from Dr. Parks Ranger for new patient evaluation for aortic valve stenosis, shortness of breath chest tightness  Reports that he was very active for many years with swimming running biking Has slowed down in recent years secondary to neuropathy in legs starting 3 years ago,  GERD sx, congestion sleeping on left side He does have anxiety  Recent echocardiogram ordered by primary care through the hospital system  [facility] Read by outside physician documenting severe aortic valve stenosis, Normal ejection fraction Images reviewed personally by myself showing mean gradient 43 peak gradient 71 peak velocity 422 cm/s estimated valve area 0.5  Reports this is a surprise to him, Newly had a murmur but did not appreciate this pathology documented  Reports having some fullness left side of his chest, sometimes when he is laying on his left side in bed has more fullness and pressure Some symptoms on exertion as well   Prior CV studies:   The following studies were reviewed today:  Testing details below  Outside stress test June 30, 2018 Reported as equivocal with normal ejection fraction ejection fraction 54% small mild fixed inferior wall defect Performed by Dr. Humphrey Rolls  Echocardiogram April 29, 2018 Left ventricle: The cavity size was mildly dilated. There was   moderate concentric hypertrophy. Systolic function was normal.   The estimated ejection fraction was 55%. Wall motion was normal;   there were no regional wall motion abnormalities. Doppler   parameters are consistent with abnormal left ventricular   relaxation (grade 1 diastolic dysfunction). - Aortic valve: There was moderate regurgitation. - Pulmonary arteries: PA peak pressure: 46 mm Hg (S).  - The right ventricular systolic pressure was increased consistent   with moderate pulmonary hypertension. [place] normal LVEF,   moderate LVH, with mild MR, and mild to moderate AR and severe   aortic stenosis.  Echocardiogram 2015 performed through [facility] Moderate aortic valve stenosis peak gradient 44 mmHg, moderate aortic valve regurgitation, mild MR Normal ejection fraction EF greater than 55%  Past Medical History:  Diagnosis Date  .  Anxiety   . Arthritis of knee   . Hypertension   . Murmur, cardiac   . Neuropathy    Past Surgical History:  Procedure Laterality Date  . NECK SURGERY  2011     Current Meds  Medication Sig   . Acetylcarn-Alpha Lipoic Acid 400-200 MG CAPS Take by mouth daily.   Marland Kitchen ALPRAZolam (XANAX) 0.5 MG tablet TAKE 1 TABLET BY MOUTH 2 TIMES A DAY AS NEEDED FOR ANXIETY  . aspirin EC 81 MG tablet Take 81 mg by mouth daily.  . Azelaic Acid 15 % cream APPLY TOPICALLY TO THE AFFECTED AREA OF THE FACE EVERY DAY  . clindamycin (CLINDAGEL) 1 % gel APPLY TWICE A DAY AS DIRECTED  . fluticasone (FLONASE) 50 MCG/ACT nasal spray SPRAY 2 SPRAYS INTO EACH NOSTRIL EVERY DAY  . glucosamine-chondroitin 500-400 MG tablet Take 1 tablet by mouth 3 (three) times daily.  Marland Kitchen ibuprofen (ADVIL,MOTRIN) 200 MG tablet Take 200 mg by mouth every 6 (six) hours as needed.  Marland Kitchen losartan (COZAAR) 100 MG tablet Take 1 tablet (100 mg total) by mouth daily.  . Menthol, Topical Analgesic, (BLUE-EMU MAXIMUM STRENGTH EX) Apply topically.  . mirabegron ER (MYRBETRIQ) 50 MG TB24 tablet Take 1 tablet (50 mg total) by mouth daily.  . Multiple Vitamin (MULTI-VITAMINS) TABS Take by mouth.  . Omega-3 1000 MG CAPS Take by mouth.  . Polyethylene Glycol 3350 (MIRALAX PO) Take by mouth as needed.  . sildenafil (REVATIO) 20 MG tablet Take up to 5 pills about 30 min prior to sex  . tretinoin (RETIN-A) 0.1 % cream APPLY A PEA SIZE AMOUNT TO FACE NIGHTLY     Allergies:   Patient has no known allergies.   Social History   Tobacco Use  . Smoking status: Never Smoker  . Smokeless tobacco: Never Used  Substance Use Topics  . Alcohol use: Yes    Alcohol/week: 8.0 standard drinks    Types: 7 Glasses of wine, 1 Cans of beer per week  . Drug use: No     Current Outpatient Medications on File Prior to Visit  Medication Sig Dispense Refill  . Acetylcarn-Alpha Lipoic Acid 400-200 MG CAPS Take by mouth daily.     Marland Kitchen ALPRAZolam (XANAX) 0.5 MG tablet TAKE 1 TABLET BY MOUTH 2 TIMES A DAY AS NEEDED FOR ANXIETY 60 tablet 2  . aspirin EC 81 MG tablet Take 81 mg by mouth daily.    . Azelaic Acid 15 % cream APPLY TOPICALLY TO THE AFFECTED AREA OF THE FACE  EVERY DAY    . clindamycin (CLINDAGEL) 1 % gel APPLY TWICE A DAY AS DIRECTED  3  . fluticasone (FLONASE) 50 MCG/ACT nasal spray SPRAY 2 SPRAYS INTO EACH NOSTRIL EVERY DAY  12  . glucosamine-chondroitin 500-400 MG tablet Take 1 tablet by mouth 3 (three) times daily.    Marland Kitchen ibuprofen (ADVIL,MOTRIN) 200 MG tablet Take 200 mg by mouth every 6 (six) hours as needed.    Marland Kitchen losartan (COZAAR) 100 MG tablet Take 1 tablet (100 mg total) by mouth daily. 90 tablet 1  . Menthol, Topical Analgesic, (BLUE-EMU MAXIMUM STRENGTH EX) Apply topically.    . mirabegron ER (MYRBETRIQ) 50 MG TB24 tablet Take 1 tablet (50 mg total) by mouth daily. 90 tablet 3  . Multiple Vitamin (MULTI-VITAMINS) TABS Take by mouth.    . Omega-3 1000 MG CAPS Take by mouth.    . Polyethylene Glycol 3350 (MIRALAX PO) Take by mouth as needed.    . sildenafil (  REVATIO) 20 MG tablet Take up to 5 pills about 30 min prior to sex 50 tablet 8  . tretinoin (RETIN-A) 0.1 % cream APPLY A PEA SIZE AMOUNT TO FACE NIGHTLY  0   No current facility-administered medications on file prior to visit.      Family Hx: The patient's family history includes Alzheimer's disease in his sister; Lung cancer in his father; Pancreatic cancer in his brother; Thyroid disease in his mother. There is no history of Prostate cancer, Bladder Cancer, or Kidney cancer.  ROS:   Please see the history of present illness.    Review of Systems  Constitutional: Negative.   Respiratory: Positive for shortness of breath.   Cardiovascular: Negative.        Chest tightness  Gastrointestinal: Negative.   Musculoskeletal: Negative.   Neurological: Negative.   Psychiatric/Behavioral: Negative.   All other systems reviewed and are negative.     Labs/Other Tests and Data Reviewed:    Recent Labs: 01/17/2018: ALT 21; BUN 21; Creatinine, Ser 1.02; Hemoglobin 13.9; Platelets 190; Potassium 4.4; Sodium 136   Recent Lipid Panel Lab Results  Component Value Date/Time   CHOL 171  09/29/2016 08:48 AM   CHOL 171 11/06/2013   TRIG 40 09/29/2016 08:48 AM   TRIG 45 11/06/2013   HDL 73 09/29/2016 08:48 AM   HDL 72 11/06/2013   CHOLHDL 2.3 09/29/2016 08:48 AM   LDLCALC 90 09/29/2016 08:48 AM    Wt Readings from Last 3 Encounters:  05/23/18 194 lb (88 kg)  04/19/18 200 lb 9.6 oz (91 kg)  01/17/18 197 lb 11.2 oz (89.7 kg)     Exam:    Vital Signs: Vital signs as detailed above in HPI Well nourished, well developed male in no acute distress. Constitutional:  oriented to person, place, and time. No distress.  Head: Normocephalic and atraumatic.  Eyes:  no discharge. No scleral icterus.  Neck: Normal range of motion. Neck supple.  Pulmonary/Chest: No audible wheezing, no distress, appears comfortable Musculoskeletal: Normal range of motion.  no  tenderness or deformity.  Neurological:   Coordination normal. Full exam not performed Skin:  No rash Psychiatric:  normal mood and affect. behavior is normal. Thought content normal.    ASSESSMENT & PLAN:    Severe aortic valve stenosis Recent echocardiogram results pulled up and reviewed personally by myself, also discussed with him on web visit today Anxious about fullness in his chest which seems to be getting worse He has been less active secondary to neuropathy Long discussion with him concerning various modalities for surgery on his aortic valve including open heart surgery, minimally invasive incision and lastly TAVR He is most receptive to TAVR and like to proceed with this if a candidate He would like to have cardiac catheterization performed in [place] if possible He lives alone and has several dogs Son lives out of state.  Son might be able to come down for procedure later Recommended right and left heart catheterization then referral to TAVR clinic  Anxiety Managed by primary care, long discussion with him, reassurance provided  Essential hypertension Blood pressure is well controlled on today's  visit. No changes made to the medications. Reports of blood pressure tends to trend upwards in the setting of anxiety  MGUS (monoclonal gammopathy of unknown significance) Managed by primary care   COVID-19 Education: The signs and symptoms of COVID-19 were discussed with the patient and how to seek care for testing (follow up with PCP or arrange E-visit).  The importance of social distancing was discussed today.  Patient Risk:   After full review of this patients clinical status, I feel that they are at least moderate risk at this time.  Time:   Today, I have spent 25 minutes with the patient with telehealth technology discussing aortic valve stenosis, various methods for repair/surgery Discussed timing, other various testing that might be needed   Total encounter time more than 60 minutes  Greater than 50% was spent in counseling and coordination of care with the patient .    Medication Adjustments/Labs and Tests Ordered: Current medicines are reviewed at length with the patient today.  Concerns regarding medicines are outlined above.   Tests Ordered: No tests ordered  Medication Changes: No changes made  Disposition: Follow-up in 1 month,  after cardiac catheterization   Signed, Ida Rogue, MD  07/18/2018 12:53 PM    [place] [facility] [address], [place], [place] [address]

## 2018-07-18 ENCOUNTER — Other Ambulatory Visit: Payer: Self-pay

## 2018-07-18 ENCOUNTER — Telehealth (INDEPENDENT_AMBULATORY_CARE_PROVIDER_SITE_OTHER): Payer: Medicare Other | Admitting: Cardiovascular Disease

## 2018-07-18 DIAGNOSIS — F419 Anxiety disorder, unspecified: Secondary | ICD-10-CM | POA: Diagnosis not present

## 2018-07-18 DIAGNOSIS — D472 Monoclonal gammopathy: Secondary | ICD-10-CM

## 2018-07-18 DIAGNOSIS — F418 Other specified anxiety disorders: Secondary | ICD-10-CM

## 2018-07-18 DIAGNOSIS — I350 Nonrheumatic aortic (valve) stenosis: Secondary | ICD-10-CM

## 2018-07-18 DIAGNOSIS — I10 Essential (primary) hypertension: Secondary | ICD-10-CM

## 2018-07-18 MED ORDER — FUROSEMIDE 20 MG PO TABS: 20.0000 mg | ORAL_TABLET | ORAL | 6 refills | Status: DC | PRN

## 2018-07-18 NOTE — Progress Notes (Signed)
Appointment scheduled for 08/11/2018 @ 11:20am

## 2018-07-18 NOTE — Patient Instructions (Addendum)
Medication Instructions:  Your physician has recommended you make the following change in your medication:  1. START Furosemide 20 mg as needed daily for leg swelling.  If you need a refill on your cardiac medications before your next appointment, please call your pharmacy.    Lab work: CBC and BMP to be done over at the [facility] and check in at the front desk.    If you have labs (blood work) drawn today and your tests are completely normal, you will receive your results only by: Marland Kitchen MyChart Message (if you have MyChart) OR . A paper copy in the mail If you have any lab test that is abnormal or we need to change your treatment, we will call you to review the results.   Testing/Procedures:  [facility] Cardiac Cath Instructions   You are scheduled for a Cardiac Cath on:_Aprili 16th_  Please arrive at 08:30 am on the day of your procedure  Please expect a call from our [facility] to pre-register you  Do not eat/drink anything after midnight  Someone will need to drive you home  It is recommended someone be with you for the first 24 hours after your procedure  Wear clothes that are easy to get on/off and wear slip on shoes if possible   Medications bring a current list of all medications with you  _XX__ Do not take these medications before your procedure: Furosemide on the day of your procedure.   _XX__ You may take all of your other medications the morning of your procedure with enough water to swallow safely   Day of your procedure: Arrive at the [facility] entrance.  Free valet service is available.  After entering the [facility] please check-in at the registration desk (1st desk on your right) to receive your armband. After receiving your armband someone will escort you to the cardiac cath/special procedures waiting area.  The usual length of stay after your procedure is about 2 to 3 hours.  This can vary.  If you have any questions,  please call our office at [phone], or you may call the cardiac cath lab at [facility] directly at [phone]    Follow-Up: At [facility], you and your health needs are our priority.  As part of our continuing mission to provide you with exceptional heart care, we have created designated Provider Care Teams.  These Care Teams include your primary Cardiologist (physician) and Advanced Practice Providers (APPs -  Physician Assistants and Nurse Practitioners) who all work together to provide you with the care you need, when you need it.  . You will need a follow up appointment in 2 weeks post procedure with telephone or video  . Providers on your designated Care Team:   . Murray Hodgkins, NP . Christell Faith, PA-C . Marrianne Mood, PA-C  Any Other Special Instructions Will Be Listed Below (If Applicable).  For educational health videos Log in to : [email or web address] Or : [email or web address], password : [email or web address]      Coronary Angiogram A coronary angiogram is an X-ray procedure that is used to examine the arteries in the heart. In this procedure, a dye (contrast dye) is injected through a long, thin tube (catheter). The catheter is inserted through the groin, wrist, or arm. The dye is injected into each artery, then X-rays are taken to show if there is a blockage in the arteries of the heart. This procedure can also show if you have valve disease or a  disease of the aorta, and it can be used to check the overall function of your heart muscle. You may have a coronary angiogram if:  You are having chest pain, or other symptoms of angina, and you are at risk for heart disease.  You have an abnormal electrocardiogram (ECG) or stress test.  You have chest pain and heart failure.  You are having irregular heart rhythms.  You and your health care provider determine that the benefits of the test information outweigh the risks of the procedure. Let your health care provider know about:  Any  allergies you have, including allergies to contrast dye.  All medicines you are taking, including vitamins, herbs, eye drops, creams, and over-the-counter medicines.  Any problems you or family members have had with anesthetic medicines.  Any blood disorders you have.  Any surgeries you have had.  History of kidney problems or kidney failure.  Any medical conditions you have.  Whether you are pregnant or may be pregnant. What are the risks? Generally, this is a safe procedure. However, problems may occur, including:  Infection.  Allergic reaction to medicines or dyes that are used.  Bleeding from the access site or other locations.  Kidney injury, especially in people with impaired kidney function.  Stroke (rare).  Heart attack (rare).  Damage to other structures or organs. What happens before the procedure? Staying hydrated Follow instructions from your health care provider about hydration, which may include:  Up to 2 hours before the procedure - you may continue to drink clear liquids, such as water, clear fruit juice, black coffee, and plain tea. Eating and drinking restrictions Follow instructions from your health care provider about eating and drinking, which may include:  8 hours before the procedure - stop eating heavy meals or foods such as meat, fried foods, or fatty foods.  6 hours before the procedure - stop eating light meals or foods, such as toast or cereal.  2 hours before the procedure - stop drinking clear liquids. General instructions  Ask your health care provider about: ? Changing or stopping your regular medicines. This is especially important if you are taking diabetes medicines or blood thinners. ? Taking medicines such as ibuprofen. These medicines can thin your blood. Do not take these medicines before your procedure if your health care provider instructs you not to, though aspirin may be recommended prior to coronary angiograms.  Plan to  have someone take you home from the hospital or clinic.  You may need to have blood tests or X-rays done. What happens during the procedure?  An IV tube will be inserted into one of your veins.  You will be given one or more of the following: ? A medicine to help you relax (sedative). ? A medicine to numb the area where the catheter will be inserted into an artery (local anesthetic).  To reduce your risk of infection: ? Your health care team will wash or sanitize their hands. ? Your skin will be washed with soap. ? Hair may be removed from the area where the catheter will be inserted.  You will be connected to a continuous ECG monitor.  The catheter will be inserted into an artery. The location may be in your groin, in your wrist, or in the fold of your arm (near your elbow).  A type of X-ray (fluoroscopy) will be used to help guide the catheter to the opening of the blood vessel that is being examined.  A dye will be injected  into the catheter, and X-rays will be taken. The dye will help to show where any narrowing or blockages are located in the heart arteries.  Tell your health care provider if you have any chest pain or trouble breathing during the procedure.  If blockages are found, your health care provider may perform another procedure, such as inserting a coronary stent. The procedure may vary among health care providers and hospitals. What happens after the procedure?  After the procedure, you will need to keep the area still for a few hours, or for as long as told by your health care provider. If the procedure is done through the groin, you will be instructed to not bend and not cross your legs.  The insertion site will be checked frequently.  The pulse in your foot or wrist will be checked frequently.  You may have additional blood tests, X-rays, and a test that records the electrical activity of your heart (ECG).  Do not drive for 24 hours if you were given a  sedative. Summary  A coronary angiogram is an X-ray procedure that is used to look into the arteries in the heart.  During the procedure, a dye (contrast dye) is injected through a long, thin tube (catheter). The catheter is inserted through the groin, wrist, or arm.  Tell your health care provider about any allergies you have, including allergies to contrast dye.  After the procedure, you will need to keep the area still for a few hours, or for as long as told by your health care provider. This information is not intended to replace advice given to you by your health care provider. Make sure you discuss any questions you have with your health care provider. Document Released: 10/04/2002 Document Revised: 01/10/2016 Document Reviewed: 01/10/2016 Elsevier Interactive Patient Education  2019 [employer].    Coronary Angioplasty, Care After This sheet gives you information about how to care for yourself after your procedure. Your health care provider may also give you more specific instructions. If you have problems or questions, contact your health care provider. What can I expect after the procedure? After your procedure, it is common to have:  Bruising at the catheter insertion site. This usually fades within 1-2 weeks.  Blood collecting in the tissue (hematoma) that may be painful to the touch. It should become smaller and less tender within 1-2 weeks. Follow these instructions at home: Medicines  Take over-the-counter and prescription medicines only as told by your health care provider.  Blood thinners may be prescribed after your procedure to improve blood flow. Bathing  You may shower 24-48 hours after the procedure or as told by your health care provider.  Do not take baths, swim, or use a hot tub until your health care provider approves. Insertion site care   Follow instructions from your health care provider about how to take care of your insertion site. Make sure  you: ? Wash your hands with soap and water before you change your bandage (dressing). If soap and water are not available, use hand sanitizer. ? Change your dressing as told by your health care provider. ? Gently wash the site with plain soap and water. ? Use a clean towel to pat the area dry. ? Do not rub the site, because this may cause bleeding. ? Do not apply powder or lotion to the site.  Check your insertion site every day for signs of infection. Check for: ? More redness, swelling, or pain. ? More fluid or  blood. ? Warmth. ? Pus or a bad smell. Lifestyle   Make any lifestyle changes as recommended by your health care provider. This may include: ? Not using any products that contain nicotine or tobacco, such as cigarettes and e-cigarettes. If you need help quitting, ask your health care provider. ? Managing your weight. ? Getting regular exercise. ? Managing your blood pressure. ? Limiting your alcohol intake. ? Managing other health problems, such as diabetes.  Eat a heart-healthy diet. This should include plenty of fresh fruits and vegetables. Avoid foods that are: ? High in salt (sodium). ? Canned or highly processed. ? High in saturated fat or sugar. ? Fried. General instructions  Do not lift over 10 lb (4.5 kg) for 5 days after your procedure or as told by your health care provider.  Ask your health care provider when it is okay to: ? Return to work or school. ? Resume usual physical activities or sports. ? Resume sexual activity.  Keep all follow-up visits as told by your health care provider. This is important. Contact a health care provider if:  You have a fever.  You have chills.  You have increased bleeding from the insertion site. Hold pressure on the site. Get help right away if:  You develop chest pain or shortness of breath, feel faint, or pass out.  You have unusual pain at the insertion site.  You have redness, warmth, or swelling at the  insertion site.  You have drainage (other than a small amount of blood on the dressing) from the insertion site.  The insertion site is bleeding, and the bleeding does not stop after 30 minutes of holding steady pressure on the site.  You develop bleeding from any other place, such as from the rectum. There may be bright red blood in your urine or stool, or it may appear as black, tarry stool. This information is not intended to replace advice given to you by your health care provider. Make sure you discuss any questions you have with your health care provider. Document Released: 10/16/2004 Document Revised: 07/27/2016 Document Reviewed: 11/03/2015 Elsevier Interactive Patient Education  2019 [employer].

## 2018-07-20 ENCOUNTER — Other Ambulatory Visit: Payer: Self-pay

## 2018-07-20 ENCOUNTER — Other Ambulatory Visit
Admission: RE | Admit: 2018-07-20 | Discharge: 2018-07-20 | Disposition: A | Discharge status: Home / Self Care | Payer: Medicare Other | Source: Ambulatory Visit | Attending: Cardiovascular Disease | Admitting: Cardiovascular Disease

## 2018-07-20 DIAGNOSIS — I350 Nonrheumatic aortic (valve) stenosis: Secondary | ICD-10-CM | POA: Insufficient documentation

## 2018-07-20 LAB — BASIC METABOLIC PANEL
Anion gap: 8 (ref 5–15)
BUN: 27 mg/dL — ABNORMAL HIGH (ref 8–23)
CO2: 23 mmol/L (ref 22–32)
Calcium: 9.1 mg/dL (ref 8.9–10.3)
Chloride: 108 mmol/L (ref 98–111)
Creatinine, Ser: 0.98 mg/dL (ref 0.61–1.24)
GFR calc Af Amer: >60 mL/min (ref >60)
GFR calc non Af Amer: >60 mL/min (ref >60)
Glucose, Bld: 111 mg/dL — ABNORMAL HIGH (ref 70–99)
Potassium: 4.6 mmol/L (ref 3.5–5.1)
Sodium: 139 mmol/L (ref 135–145)

## 2018-07-20 LAB — CBC WITH DIFFERENTIAL/PLATELET
Abs Immature Granulocytes: 0.01 10*3/uL (ref 0.00–0.07)
Basophils Absolute: 0 10*3/uL (ref 0.0–0.1)
Basophils Relative: 1 %
Eosinophils Absolute: 0.2 10*3/uL (ref 0.0–0.5)
Eosinophils Relative: 4 %
HCT: 43 % (ref 39.0–52.0)
Hemoglobin: 14.4 g/dL (ref 13.0–17.0)
Immature Granulocytes: 0 %
Lymphocytes Relative: 15 %
Lymphs Abs: 0.7 10*3/uL (ref 0.7–4.0)
MCH: 32.1 pg (ref 26.0–34.0)
MCHC: 33.5 g/dL (ref 30.0–36.0)
MCV: 96 fL (ref 80.0–100.0)
Monocytes Absolute: 0.5 10*3/uL (ref 0.1–1.0)
Monocytes Relative: 11 %
Neutro Abs: 3.3 10*3/uL (ref 1.7–7.7)
Neutrophils Relative %: 69 %
Platelets: 205 10*3/uL (ref 150–400)
RBC: 4.48 MIL/uL (ref 4.22–5.81)
RDW: 13.3 % (ref 11.5–15.5)
WBC: 4.7 10*3/uL (ref 4.0–10.5)
nRBC: 0 % (ref 0.0–0.2)

## 2018-07-21 ENCOUNTER — Other Ambulatory Visit: Payer: Medicare Other

## 2018-07-21 ENCOUNTER — Ambulatory Visit: Payer: Medicare Other | Admitting: Oncology

## 2018-07-24 ENCOUNTER — Other Ambulatory Visit: Payer: Self-pay

## 2018-07-25 ENCOUNTER — Other Ambulatory Visit: Payer: Self-pay

## 2018-07-25 ENCOUNTER — Inpatient Hospital Stay: Payer: Medicare Other | Attending: Oncology

## 2018-07-25 ENCOUNTER — Inpatient Hospital Stay: Payer: Medicare Other | Admitting: Oncology

## 2018-07-25 DIAGNOSIS — G629 Polyneuropathy, unspecified: Secondary | ICD-10-CM | POA: Diagnosis not present

## 2018-07-25 DIAGNOSIS — D72810 Lymphocytopenia: Secondary | ICD-10-CM | POA: Diagnosis not present

## 2018-07-25 DIAGNOSIS — Z79899 Other long term (current) drug therapy: Secondary | ICD-10-CM | POA: Diagnosis not present

## 2018-07-25 DIAGNOSIS — D472 Monoclonal gammopathy: Secondary | ICD-10-CM

## 2018-07-25 LAB — CBC WITH DIFFERENTIAL/PLATELET
Abs Immature Granulocytes: 0.01 10*3/uL (ref 0.00–0.07)
Basophils Absolute: 0 10*3/uL (ref 0.0–0.1)
Basophils Relative: 1 %
Eosinophils Absolute: 0.2 10*3/uL (ref 0.0–0.5)
Eosinophils Relative: 4 %
HCT: 41.6 % (ref 39.0–52.0)
Hemoglobin: 14 g/dL (ref 13.0–17.0)
Immature Granulocytes: 0 %
Lymphocytes Relative: 17 %
Lymphs Abs: 0.7 10*3/uL (ref 0.7–4.0)
MCH: 32.1 pg (ref 26.0–34.0)
MCHC: 33.7 g/dL (ref 30.0–36.0)
MCV: 95.4 fL (ref 80.0–100.0)
Monocytes Absolute: 0.4 10*3/uL (ref 0.1–1.0)
Monocytes Relative: 10 %
Neutro Abs: 2.9 10*3/uL (ref 1.7–7.7)
Neutrophils Relative %: 68 %
Platelets: 178 10*3/uL (ref 150–400)
RBC: 4.36 MIL/uL (ref 4.22–5.81)
RDW: 13.3 % (ref 11.5–15.5)
WBC: 4.3 10*3/uL (ref 4.0–10.5)
nRBC: 0 % (ref 0.0–0.2)

## 2018-07-25 LAB — COMPREHENSIVE METABOLIC PANEL
ALT: 22 U/L (ref 0–44)
AST: 28 U/L (ref 15–41)
Albumin: 4.1 g/dL (ref 3.5–5.0)
Alkaline Phosphatase: 68 U/L (ref 38–126)
Anion gap: 8 (ref 5–15)
BUN: 27 mg/dL — ABNORMAL HIGH (ref 8–23)
CO2: 23 mmol/L (ref 22–32)
Calcium: 8.8 mg/dL — ABNORMAL LOW (ref 8.9–10.3)
Chloride: 106 mmol/L (ref 98–111)
Creatinine, Ser: 0.89 mg/dL (ref 0.61–1.24)
GFR calc Af Amer: >60 mL/min (ref >60)
GFR calc non Af Amer: >60 mL/min (ref >60)
Glucose, Bld: 102 mg/dL — ABNORMAL HIGH (ref 70–99)
Potassium: 4.3 mmol/L (ref 3.5–5.1)
Sodium: 137 mmol/L (ref 135–145)
Total Bilirubin: 1.4 mg/dL — ABNORMAL HIGH (ref 0.3–1.2)
Total Protein: 7.2 g/dL (ref 6.5–8.1)

## 2018-07-26 ENCOUNTER — Other Ambulatory Visit: Payer: Self-pay

## 2018-07-26 ENCOUNTER — Other Ambulatory Visit: Payer: Self-pay | Admitting: *Deleted

## 2018-07-26 ENCOUNTER — Encounter: Payer: Self-pay | Admitting: Oncology

## 2018-07-26 ENCOUNTER — Inpatient Hospital Stay (HOSPITAL_BASED_OUTPATIENT_CLINIC_OR_DEPARTMENT_OTHER): Payer: Medicare Other | Admitting: Oncology

## 2018-07-26 ENCOUNTER — Inpatient Hospital Stay: Payer: Medicare Other | Admitting: Oncology

## 2018-07-26 DIAGNOSIS — D472 Monoclonal gammopathy: Secondary | ICD-10-CM | POA: Diagnosis not present

## 2018-07-26 DIAGNOSIS — G629 Polyneuropathy, unspecified: Secondary | ICD-10-CM

## 2018-07-26 DIAGNOSIS — D72810 Lymphocytopenia: Secondary | ICD-10-CM

## 2018-07-26 LAB — MULTIPLE MYELOMA PANEL, SERUM
Albumin SerPl Elph-Mcnc: 3.8 g/dL (ref 2.9–4.4)
Albumin/Glob SerPl: 1.4 (ref 0.7–1.7)
Alpha 1: 0.2 g/dL (ref 0.0–0.4)
Alpha2 Glob SerPl Elph-Mcnc: 0.5 g/dL (ref 0.4–1.0)
B-Globulin SerPl Elph-Mcnc: 1 g/dL (ref 0.7–1.3)
Gamma Glob SerPl Elph-Mcnc: 1.2 g/dL (ref 0.4–1.8)
Globulin, Total: 2.8 g/dL (ref 2.2–3.9)
IgA: 244 mg/dL (ref 61–437)
IgG (Immunoglobin G), Serum: 1299 mg/dL (ref 603–1613)
IgM (Immunoglobulin M), Srm: 33 mg/dL (ref 15–143)
M Protein SerPl Elph-Mcnc: 0.7 g/dL — ABNORMAL HIGH
Total Protein ELP: 6.6 g/dL (ref 6.0–8.5)

## 2018-07-26 LAB — HEPATITIS PANEL, ACUTE
HCV Ab: 0.1 s/co ratio (ref 0.0–0.9)
Hep A IgM: NEGATIVE
Hep B C IgM: NEGATIVE
Hepatitis B Surface Ag: NEGATIVE

## 2018-07-26 LAB — GAMMA GT: GGT: 26 U/L (ref 7–50)

## 2018-07-26 NOTE — Progress Notes (Signed)
Called patient today for WebEx visit.  Patient states no new concerns today

## 2018-07-26 NOTE — Progress Notes (Addendum)
HEMATOLOGY-ONCOLOGY TeleHEALTH VISIT PROGRESS NOTE  I connected with James Cardenas on 07/26/18 at  8:30 AM EDT by video enabled telemedicine visit and verified that I am speaking with the correct person using two identifiers. I discussed the limitations, risks, security and privacy concerns of performing an evaluation and management service by telemedicine and the availability of in-person appointments. I also discussed with the patient that there may be a patient responsible charge related to this service. The patient expressed understanding and agreed to proceed.   Other persons participating in the visit and their role in the encounter:  Geraldine Solar, CMA, check in patient     Patient's location: Home  Provider's location: Home   Chief Complaint: Follow up for MGUS, lymphocytopenia.    INTERVAL HISTORY James Cardenas is a 78 y.o. male who has above history reviewed by me today presents for follow up visit for management of MGUS and lymphocytopenia.  Problems and complaints are listed below:  He reports feeling well at base line.  Denies any fever, chills, unintentional weight loss, new bone pain. He follows up with neurology for chronic neuropathy since 2016. No recent infection, hospitalization.    Review of Systems  Constitutional: Negative for appetite change, chills, fatigue, fever and unexpected weight change.  HENT:   Negative for hearing loss and voice change.   Eyes: Negative for eye problems and icterus.  Respiratory: Negative for chest tightness, cough and shortness of breath.   Cardiovascular: Negative for chest pain and leg swelling.  Gastrointestinal: Negative for abdominal distention and abdominal pain.  Endocrine: Negative for hot flashes.  Genitourinary: Negative for difficulty urinating, dysuria and frequency.   Musculoskeletal: Negative for arthralgias.  Skin: Negative for itching and rash.  Neurological: Positive for numbness. Negative for light-headedness.   Hematological: Negative for adenopathy. Does not bruise/bleed easily.  Psychiatric/Behavioral: Negative for confusion.    Past Medical History:  Diagnosis Date  . Anxiety   . Arthritis of knee   . Hypertension   . Murmur, cardiac   . Neuropathy    Past Surgical History:  Procedure Laterality Date  . NECK SURGERY  2011    Family History  Problem Relation Age of Onset  . Thyroid disease Mother   . Lung cancer Father   . Pancreatic cancer Brother   . Alzheimer's disease Sister   . Prostate cancer Neg Hx   . Bladder Cancer Neg Hx   . Kidney cancer Neg Hx     Social History   Socioeconomic History  . Marital status: Divorced    Spouse name: Not on file  . Number of children: Not on file  . Years of education: Not on file  . Highest education level: Bachelor's degree (e.g., BA, AB, BS)  Occupational History  . Occupation: Retired Designer, multimedia, Public affairs consultant / heat treating)  Social Needs  . Financial resource strain: Not hard at all  . Food insecurity:    Worry: Never true    Inability: Never true  . Transportation needs:    Medical: No    Non-medical: No  Tobacco Use  . Smoking status: Never Smoker  . Smokeless tobacco: Never Used  Substance and Sexual Activity  . Alcohol use: Yes    Alcohol/week: 8.0 standard drinks    Types: 7 Glasses of wine, 1 Cans of beer per week  . Drug use: No  . Sexual activity: Yes  Lifestyle  . Physical activity:    Days per week: 3 days  Minutes per session: 30 min  . Stress: Not at all  Relationships  . Social connections:    Talks on phone: More than three times a week    Gets together: More than three times a week    Attends religious service: More than 4 times per year    Active member of club or organization: No    Attends meetings of clubs or organizations: Never    Relationship status: Divorced  . Intimate partner violence:    Fear of current or ex partner: No    Emotionally abused: No    Physically abused: No     Forced sexual activity: No  Other Topics Concern  . Not on file  Social History Narrative  . Not on file    Current Outpatient Medications on File Prior to Visit  Medication Sig Dispense Refill  . Alpha-Lipoic Acid 600 MG CAPS Take 600 mg by mouth daily.    Marland Kitchen ALPRAZolam (XANAX) 0.5 MG tablet TAKE 1 TABLET BY MOUTH 2 TIMES A DAY AS NEEDED FOR ANXIETY (Patient taking differently: Take 0.5 mg by mouth 2 (two) times daily as needed for anxiety. ) 60 tablet 2  . aspirin EC 81 MG tablet Take 81 mg by mouth every evening.     . Azelaic Acid 15 % cream Apply 1 application topically 2 (two) times daily. After skin is thoroughly washed and patted dry, gently but thoroughly massage a thin film of azelaic acid cream into the affected area twice daily, in the morning and evening.    . Carboxymethylcellul-Glycerin (LUBRICATING EYE DROPS OP) Place 1 drop into both eyes daily as needed (dry eyes).    . cholecalciferol (VITAMIN D3) 25 MCG (1000 UT) tablet Take 1,000 Units by mouth daily.    . furosemide (LASIX) 20 MG tablet Take 1 tablet (20 mg total) by mouth as needed (As needed for leg swelling). 30 tablet 6  . Glucosamine HCl-MSM (GLUCOSAMINE-MSM PO) Take 2 tablets by mouth daily.    Marland Kitchen losartan (COZAAR) 100 MG tablet Take 1 tablet (100 mg total) by mouth daily. 90 tablet 1  . mirabegron ER (MYRBETRIQ) 50 MG TB24 tablet Take 1 tablet (50 mg total) by mouth daily. 90 tablet 3  . Multiple Vitamin (MULTI-VITAMINS) TABS Take 1 tablet by mouth every evening.     . Omega-3 1000 MG CAPS Take 2,000 mg by mouth daily at 2 PM.     . OVER THE COUNTER MEDICATION Take 1 tablet by mouth at bedtime as needed (sleep). Tri-sleep    . polyethylene glycol (MIRALAX / GLYCOLAX) packet Take 17 g by mouth 2 (two) times daily as needed for moderate constipation.    . sildenafil (REVATIO) 20 MG tablet Take up to 5 pills about 30 min prior to sex (Patient taking differently: Take 100 mg by mouth as needed (ed). ) 50 tablet 8  .  tretinoin (RETIN-A) 0.1 % cream Apply 1 application topically at bedtime.   0   No current facility-administered medications on file prior to visit.     Allergies  Allergen Reactions  . Nickel Itching       Observations/Objective: Today's Vitals   07/26/18 0820  PainSc: 0-No pain   There is no height or weight on file to calculate BMI.  Physical Exam  Constitutional: He is oriented to person, place, and time. No distress.  HENT:  Head: Normocephalic and atraumatic.  Eyes: EOM are normal.  Neck: Normal range of motion.  Pulmonary/Chest: Effort normal.  No respiratory distress.  Neurological: He is alert and oriented to person, place, and time.  Psychiatric: Affect normal.   CBC    Component Value Date/Time   WBC 4.3 07/25/2018 1241   RBC 4.36 07/25/2018 1241   HGB 14.0 07/25/2018 1241   HCT 41.6 07/25/2018 1241   PLT 178 07/25/2018 1241   MCV 95.4 07/25/2018 1241   MCH 32.1 07/25/2018 1241   MCHC 33.7 07/25/2018 1241   RDW 13.3 07/25/2018 1241   LYMPHSABS 0.7 07/25/2018 1241   MONOABS 0.4 07/25/2018 1241   EOSABS 0.2 07/25/2018 1241   BASOSABS 0.0 07/25/2018 1241    CMP     Component Value Date/Time   NA 137 07/25/2018 1241   NA 139 11/09/2015   K 4.3 07/25/2018 1241   K 4.5 11/09/2015   CL 106 07/25/2018 1241   CL 101 11/09/2015   CO2 23 07/25/2018 1241   CO2 25 11/09/2015   GLUCOSE 102 (H) 07/25/2018 1241   BUN 27 (H) 07/25/2018 1241   BUN 20 11/09/2015   CREATININE 0.89 07/25/2018 1241   CREATININE 0.89 11/09/2015   CALCIUM 8.8 (L) 07/25/2018 1241   CALCIUM 8.9 11/09/2015   PROT 7.2 07/25/2018 1241   ALBUMIN 4.1 07/25/2018 1241   AST 28 07/25/2018 1241   ALT 22 07/25/2018 1241   ALKPHOS 68 07/25/2018 1241   BILITOT 1.4 (H) 07/25/2018 1241   GFRNONAA >60 07/25/2018 1241   GFRNONAA 84 11/09/2015   GFRAA >60 07/25/2018 1241     Assessment and Plan: 1. Hyperbilirubinemia   2. MGUS (monoclonal gammopathy of unknown significance)   3. Neuropathy    4. Lymphocytopenia     Labs are reviewed and discussed with patient.  CBC and CMP are not remarkable.  Lymphocytopenia resolved.  Hepatitis panel negative.  Flowcytometry pending.  Multiple myeloma panel is pending. Discussed with patient that if M spike is stable, he will continue follow up with [place] every 6 months for IgG MGUS.   Slight hyperbilirubinemia, will add GGT  Follow Up Instructions: 6 months with lab md assessment.    I discussed the assessment and treatment plan with the patient. The patient was provided an opportunity to ask questions and all were answered. The patient agreed with the plan and demonstrated an understanding of the instructions.  The patient was advised to call back or seek an in-person evaluation if the symptoms worsen or if the condition fails to improve as anticipated.   I provided 15 minutes of face-to-face video visit time during this encounter, and > 50% was spent counseling as documented under my assessment & plan.  Earlie Server, MD 07/26/2018 8:55 AM

## 2018-07-27 ENCOUNTER — Other Ambulatory Visit: Payer: Self-pay | Admitting: Cardiovascular Disease

## 2018-07-27 DIAGNOSIS — I350 Nonrheumatic aortic (valve) stenosis: Secondary | ICD-10-CM

## 2018-07-27 LAB — COMP PANEL: LEUKEMIA/LYMPHOMA

## 2018-07-28 ENCOUNTER — Ambulatory Visit
Admission: RE | Admit: 2018-07-28 | Discharge: 2018-07-28 | Disposition: A | Discharge status: Home / Self Care | Payer: Medicare Other | Attending: Cardiovascular Disease | Admitting: Cardiovascular Disease

## 2018-07-28 ENCOUNTER — Other Ambulatory Visit: Payer: Self-pay

## 2018-07-28 ENCOUNTER — Encounter
Admission: RE | Disposition: A | Discharge status: Home / Self Care | Payer: Self-pay | Source: Home / Self Care | Attending: Cardiovascular Disease

## 2018-07-28 ENCOUNTER — Encounter: Payer: Self-pay | Admitting: *Deleted

## 2018-07-28 DIAGNOSIS — N3281 Overactive bladder: Secondary | ICD-10-CM | POA: Diagnosis not present

## 2018-07-28 DIAGNOSIS — Z79899 Other long term (current) drug therapy: Secondary | ICD-10-CM | POA: Insufficient documentation

## 2018-07-28 DIAGNOSIS — Z7951 Long term (current) use of inhaled steroids: Secondary | ICD-10-CM | POA: Diagnosis not present

## 2018-07-28 DIAGNOSIS — R0789 Other chest pain: Secondary | ICD-10-CM | POA: Diagnosis not present

## 2018-07-28 DIAGNOSIS — Z7982 Long term (current) use of aspirin: Secondary | ICD-10-CM | POA: Insufficient documentation

## 2018-07-28 DIAGNOSIS — D472 Monoclonal gammopathy: Secondary | ICD-10-CM | POA: Diagnosis not present

## 2018-07-28 DIAGNOSIS — I272 Pulmonary hypertension, unspecified: Secondary | ICD-10-CM | POA: Diagnosis present

## 2018-07-28 DIAGNOSIS — I10 Essential (primary) hypertension: Secondary | ICD-10-CM | POA: Diagnosis not present

## 2018-07-28 DIAGNOSIS — I350 Nonrheumatic aortic (valve) stenosis: Secondary | ICD-10-CM

## 2018-07-28 DIAGNOSIS — F419 Anxiety disorder, unspecified: Secondary | ICD-10-CM | POA: Diagnosis not present

## 2018-07-28 DIAGNOSIS — R600 Localized edema: Secondary | ICD-10-CM | POA: Diagnosis not present

## 2018-07-28 DIAGNOSIS — G629 Polyneuropathy, unspecified: Secondary | ICD-10-CM | POA: Insufficient documentation

## 2018-07-28 DIAGNOSIS — R0602 Shortness of breath: Secondary | ICD-10-CM | POA: Diagnosis not present

## 2018-07-28 DIAGNOSIS — Z8249 Family history of ischemic heart disease and other diseases of the circulatory system: Secondary | ICD-10-CM | POA: Diagnosis not present

## 2018-07-28 HISTORY — PX: RIGHT HEART CATH AND CORONARY ANGIOGRAPHY: CATH118264

## 2018-07-28 SURGERY — RIGHT HEART CATH AND CORONARY ANGIOGRAPHY
Anesthesia: Moderate Sedation

## 2018-07-28 SURGERY — LEFT HEART CATH AND CORONARY ANGIOGRAPHY
Anesthesia: Moderate Sedation | Laterality: Left

## 2018-07-28 MED ORDER — MIDAZOLAM HCL 2 MG/2ML IJ SOLN
INTRAMUSCULAR | Status: AC
  Filled 2018-07-28: qty 2

## 2018-07-28 MED ORDER — HEPARIN (PORCINE) IN NACL 1000-0.9 UT/500ML-% IV SOLN
INTRAVENOUS | Status: AC
  Filled 2018-07-28: qty 1000

## 2018-07-28 MED ORDER — ASPIRIN 81 MG PO CHEW
CHEWABLE_TABLET | ORAL | Status: AC
  Filled 2018-07-28: qty 1

## 2018-07-28 MED ORDER — IOPAMIDOL (ISOVUE-300) INJECTION 61%
INTRAVENOUS | Status: DC | PRN
  Administered 2018-07-28: 11:00:00 50 mL via INTRA_ARTERIAL

## 2018-07-28 MED ORDER — FENTANYL CITRATE (PF) 100 MCG/2ML IJ SOLN
INTRAMUSCULAR | Status: DC | PRN
  Administered 2018-07-28 (×2): 25 ug via INTRAVENOUS

## 2018-07-28 MED ORDER — FENTANYL CITRATE (PF) 100 MCG/2ML IJ SOLN
INTRAMUSCULAR | Status: AC
  Filled 2018-07-28: qty 2

## 2018-07-28 MED ORDER — HYDRALAZINE HCL 20 MG/ML IJ SOLN: 10.0000 mg | INTRAMUSCULAR | Status: DC | PRN

## 2018-07-28 MED ORDER — MIDAZOLAM HCL 2 MG/2ML IJ SOLN
INTRAMUSCULAR | Status: DC | PRN
  Administered 2018-07-28 (×2): 1 mg via INTRAVENOUS

## 2018-07-28 MED ORDER — SODIUM CHLORIDE 0.9 % IV SOLN
INTRAVENOUS | Status: DC
  Administered 2018-07-28: 09:00:00 via INTRAVENOUS

## 2018-07-28 MED ORDER — HEPARIN (PORCINE) IN NACL 1000-0.9 UT/500ML-% IV SOLN
INTRAVENOUS | Status: DC | PRN
  Administered 2018-07-28 (×2): 500 mL

## 2018-07-28 MED ORDER — ASPIRIN 81 MG PO CHEW
81.0000 mg | CHEWABLE_TABLET | ORAL | Status: AC
  Administered 2018-07-28: 81 mg via ORAL

## 2018-07-28 MED ORDER — LABETALOL HCL 5 MG/ML IV SOLN: 10.0000 mg | INTRAVENOUS | Status: DC | PRN

## 2018-07-28 SURGICAL SUPPLY — 16 items
CATH INFINITI 5FR ANG PIGTAIL (CATHETERS) ×1 IMPLANT
CATH INFINITI 5FR JL4 (CATHETERS) ×1 IMPLANT
CATH INFINITI JR4 5F (CATHETERS) ×1 IMPLANT
CATH SWANZ 7F THERMO (CATHETERS) ×1 IMPLANT
DEVICE CLOSURE MYNXGRIP 5F (Vascular Products) ×1 IMPLANT
GUIDEWIRE EMER 3M J .025X150CM (WIRE) ×1 IMPLANT
KIT MANI 3VAL PERCEP (MISCELLANEOUS) ×2 IMPLANT
KIT RIGHT HEART (MISCELLANEOUS) ×2 IMPLANT
NDL PERC 18GX7CM (NEEDLE) IMPLANT
NEEDLE PERC 18GX7CM (NEEDLE) ×2 IMPLANT
PACK CARDIAC CATH (CUSTOM PROCEDURE TRAY) ×2 IMPLANT
SHEATH AVANTI 5FR X 11CM (SHEATH) ×1 IMPLANT
SHEATH AVANTI 7FRX11 (SHEATH) ×1 IMPLANT
WIRE EMERALD ST .035X150CM (WIRE) ×1 IMPLANT
WIRE GUIDERIGHT .035X150 (WIRE) ×1 IMPLANT
WIRE HITORQ VERSACORE ST 145CM (WIRE) ×1 IMPLANT

## 2018-07-28 NOTE — Discharge Instructions (Signed)
Moderate Conscious Sedation, Adult, Care After °These instructions provide you with information about caring for yourself after your procedure. Your health care provider may also give you more specific instructions. Your treatment has been planned according to current medical practices, but problems sometimes occur. Call your health care provider if you have any problems or questions after your procedure. °What can I expect after the procedure? °After your procedure, it is common: °· To feel sleepy for several hours. °· To feel clumsy and have poor balance for several hours. °· To have poor judgment for several hours. °· To vomit if you eat too soon. °Follow these instructions at home: °For at least 24 hours after the procedure: ° °· Do not: °? Participate in activities where you could fall or become injured. °? Drive. °? Use heavy machinery. °? Drink alcohol. °? Take sleeping pills or medicines that cause drowsiness. °? Make important decisions or sign legal documents. °? Take care of children on your own. °· Rest. °Eating and drinking °· Follow the diet recommended by your health care provider. °· If you vomit: °? Drink water, juice, or soup when you can drink without vomiting. °? Make sure you have little or no nausea before eating solid foods. °General instructions °· Have a responsible adult stay with you until you are awake and alert. °· Take over-the-counter and prescription medicines only as told by your health care provider. °· If you smoke, do not smoke without supervision. °· Keep all follow-up visits as told by your health care provider. This is important. °Contact a health care provider if: °· You keep feeling nauseous or you keep vomiting. °· You feel light-headed. °· You develop a rash. °· You have a fever. °Get help right away if: °· You have trouble breathing. °This information is not intended to replace advice given to you by your health care provider. Make sure you discuss any questions you have  with your health care provider. °Document Released: 01/18/2013 Document Revised: 09/02/2015 Document Reviewed: 07/20/2015 °Elsevier Interactive Patient Education © 2019 [employer] °Angiogram, Care After °This sheet gives you information about how to care for yourself after your procedure. Your doctor may also give you more specific instructions. If you have problems or questions, contact your doctor. °Follow these instructions at home: °Insertion site care °· Follow instructions from your doctor about how to take care of your long, thin tube (catheter) insertion area. Make sure you: °? Wash your hands with soap and water before you change your bandage (dressing). If you cannot use soap and water, use hand sanitizer. °? Change your bandage as told by your doctor. °? Leave stitches (sutures), skin glue, or skin tape (adhesive) strips in place. They may need to stay in place for 2 weeks or longer. If tape strips get loose and curl up, you may trim the loose edges. Do not remove tape strips completely unless your doctor says it is okay. °· Do not take baths, swim, or use a hot tub until your doctor says it is okay. °· You may shower 24-48 hours after the procedure or as told by your doctor. °? Gently wash the area with plain soap and water. °? Pat the area dry with a clean towel. °? Do not rub the area. This may cause bleeding. °· Do not apply powder or lotion to the area. Keep the area clean and dry. °· Check your insertion area every day for signs of infection. Check for: °? More redness, swelling, or pain. °?   Fluid or blood. °? Warmth. °? Pus or a bad smell. °Activity °· Rest as told by your doctor, usually for 1-2 days. °· Do not lift anything that is heavier than 10 lbs. (4.5 kg) or as told by your doctor. °· Do not drive for 24 hours if you were given a medicine to help you relax (sedative). °· Do not drive or use heavy machinery while taking prescription pain medicine. °General instructions ° °· Go back to  your normal activities as told by your doctor, usually in about a week. Ask your doctor what activities are safe for you. °· If the insertion area starts to bleed, lie flat and put pressure on the area. If the bleeding does not stop, get help right away. This is an emergency. °· Drink enough fluid to keep your pee (urine) clear or pale yellow. °· Take over-the-counter and prescription medicines only as told by your doctor. °· Keep all follow-up visits as told by your doctor. This is important. °Contact a doctor if: °· You have a fever. °· You have chills. °· You have more redness, swelling, or pain around your insertion area. °· You have fluid or blood coming from your insertion area. °· The insertion area feels warm to the touch. °· You have pus or a bad smell coming from your insertion area. °· You have more bruising around the insertion area. °· Blood collects in the tissue around the insertion area (hematoma) that may be painful to the touch. °Get help right away if: °· You have a lot of pain in the insertion area. °· The insertion area swells very fast. °· The insertion area is bleeding, and the bleeding does not stop after holding steady pressure on the area. °· The area near or just beyond the insertion area becomes pale, cool, tingly, or numb. °These symptoms may be an emergency. Do not wait to see if the symptoms will go away. Get medical help right away. Call your local emergency services (911 in the [place] Do not drive yourself to the hospital. °Summary °· After the procedure, it is common to have bruising and tenderness at the long, thin tube insertion area. °· After the procedure, it is important to rest and drink plenty of fluids. °· Do not take baths, swim, or use a hot tub until your doctor says it is okay to do so. You may shower 24-48 hours after the procedure or as told by your doctor. °· If the insertion area starts to bleed, lie flat and put pressure on the area. If the bleeding does not stop,  get help right away. This is an emergency. °This information is not intended to replace advice given to you by your health care provider. Make sure you discuss any questions you have with your health care provider. °Document Released: 06/26/2008 Document Revised: 03/24/2016 Document Reviewed: 03/24/2016 °Elsevier Interactive Patient Education © 2019 [employer] ° °

## 2018-07-29 ENCOUNTER — Encounter: Payer: Self-pay | Admitting: Cardiovascular Disease

## 2018-07-31 NOTE — H&P (Signed)
H&P Addendum, precardiac catheterization  Patient was seen and evaluated prior to Cardiac catheterization procedure Symptoms, prior testing details again confirmed with the patient Patient examined, no significant change from prior exam Lab work reviewed in detail personally by myself Patient understands risk and benefit of the procedure, willing to proceed  Signed, Tim Emmaleigh Longo, MD, Ph.D [facility]    

## 2018-08-11 ENCOUNTER — Other Ambulatory Visit: Payer: Self-pay

## 2018-08-11 ENCOUNTER — Telehealth (INDEPENDENT_AMBULATORY_CARE_PROVIDER_SITE_OTHER): Payer: Medicare Other | Admitting: Cardiovascular Disease

## 2018-08-11 DIAGNOSIS — I350 Nonrheumatic aortic (valve) stenosis: Secondary | ICD-10-CM | POA: Diagnosis not present

## 2018-08-11 DIAGNOSIS — I10 Essential (primary) hypertension: Secondary | ICD-10-CM | POA: Diagnosis not present

## 2018-08-11 DIAGNOSIS — D472 Monoclonal gammopathy: Secondary | ICD-10-CM | POA: Diagnosis not present

## 2018-08-11 DIAGNOSIS — F419 Anxiety disorder, unspecified: Secondary | ICD-10-CM

## 2018-08-11 DIAGNOSIS — I7090 Unspecified atherosclerosis: Secondary | ICD-10-CM | POA: Diagnosis not present

## 2018-08-11 NOTE — Patient Instructions (Signed)
Can we confirm we have placed a consultation to Dr. Burt Knack or/and [facility] Form for TAVR clinic Perhaps we can call the coordinator and get an estimate of when they will restart clinic Patient eager to get going   Medication Instructions:  No changes  If you need a refill on your cardiac medications before your next appointment, please call your pharmacy.    Lab work: No new labs needed   If you have labs (blood work) drawn today and your tests are completely normal, you will receive your results only by: Marland Kitchen MyChart Message (if you have MyChart) OR . A paper copy in the mail If you have any lab test that is abnormal or we need to change your treatment, we will call you to review the results.   Testing/Procedures: No new testing needed   Follow-Up: At [facility], you and your health needs are our priority.  As part of our continuing mission to provide you with exceptional heart care, we have created designated Provider Care Teams.  These Care Teams include your primary Cardiologist (physician) and Advanced Practice Providers (APPs -  Physician Assistants and Nurse Practitioners) who all work together to provide you with the care you need, when you need it.  . You will need a follow up appointment in 6 months .   Please call our office 2 months in advance to schedule this appointment.    . Providers on your designated Care Team:   . Murray Hodgkins, NP . Christell Faith, PA-C . Marrianne Mood, PA-C  Any Other Special Instructions Will Be Listed Below (If Applicable).  For educational health videos Log in to : [email or web address] Or : [email or web address], password : [email or web address]

## 2018-08-11 NOTE — Progress Notes (Signed)
Virtual Visit via Video Note   This visit type was conducted due to national recommendations for restrictions regarding the COVID-19 Pandemic (e.g. social distancing) in an effort to limit this patient's exposure and mitigate transmission in our community.  Due to his co-morbid illnesses, this patient is at least at moderate risk for complications without adequate follow up.  This format is felt to be most appropriate for this patient at this time.  All issues noted in this document were discussed and addressed.  A limited physical exam was performed with this format.  Please refer to the patient's chart for his consent to telehealth for [facility].   I connected with  WILLAIM Cardenas on 08/11/18 by a video enabled telemedicine application and verified that I am speaking with the correct person using two identifiers. I discussed the limitations of evaluation and management by telemedicine. The patient expressed understanding and agreed to proceed.   Evaluation Performed:  Follow-up visit  Date:  08/11/2018   ID:  James Cardenas, DOB 09-13-1940, MRN [id number]  Patient Location:  [address] [place] [address]   Provider location:   [facility], [place] office  PCP:  Olin Hauser, DO  Cardiologist:  Arvid [facility]   Chief Complaint:  Chest tightness    History of Present Illness:    James Cardenas is a 78 y.o. male who presents via audio/video conferencing for a telehealth visit today.   The patient does not symptoms concerning for COVID-19 infection (fever, chills, cough, or new SHORTNESS OF BREATH).   Patient has a past medical history of Aortic valve stenosis , severe Anxiety GERD Essential hypertension Overactive bladder Peripheral neuropathy MGUS (monoclonal gammopathy of unknown significance) Who presents for follow-up of his severe aortic valve stenosis, shortness of breath chest tightness  Recent cardiac  catheterization confirming no significant coronary disease normal right heart pressures small groin hematoma, better  Would like to start getting out in his garden doing little bits of activity Also would like to start using his exercise bike doing low-grade exercise if possible Has some tightness in his chest at times which he attributes to his heart valve Seems to have this feeling when he lays down at nighttime particularly laying on his left side Also with heavy exertion  Reports blood pressure typically well controlled, weight stable No significant ankle swelling, denies tachycardia Continues to have issues with anxiety, and he is to have valve procedure GERD symptoms stable  Weight 188 BP: 122/72 Pulse 58 Resp 16  Recent echocardiogram   severe aortic valve stenosis, Normal ejection fraction Images reviewed personally by myself showing mean gradient 43 peak gradient 71 peak velocity 422 cm/s estimated valve area 0.5   Prior CV studies:   The following studies were reviewed today:  Cardiac catheterization July 28, 2018 Left ventriculography: Left ventricular systolic function is normal, LVEF is estimated at 55-65%, there is no significant mitral regurgitation , no significant aortic valve stenosis  Right heart catheterization: RA pressure 9 RV pressure 55/4, mean of 9 PA pressure 46/14, mean 29 Tremendous difficulty obtaining wedge, despite multiple attempts with a wire Unable to gather PA venous saturations  No significant coronary artery disease Unable to cross aortic valve for pressures or LV function Grossly normal right heart pressures   Echocardiogram April 29, 2018 Left ventricle: The cavity size was mildly dilated. There was moderate concentric hypertrophy. Systolic function was normal. The estimated ejection fraction was 55%. Wall  motion was normal; there were no regional wall motion abnormalities. Doppler parameters are consistent with  abnormal left ventricular relaxation (grade 1 diastolic dysfunction). - Aortic valve: There was moderate regurgitation. - Pulmonary arteries: PA peak pressure: 46 mm Hg (S).  - The right ventricular systolic pressure was increased consistent with moderate pulmonary hypertension. [place] normal LVEF, moderate LVH, with mild MR, and mild to moderate AR and severe aortic stenosis.  Echocardiogram 2015 performed through [facility] Moderate aortic valve stenosis peak gradient 44 mmHg, moderate aortic valve regurgitation, mild MR Normal ejection fraction EF greater than 55%   Past Medical History:  Diagnosis Date  . Anxiety   . Arthritis of knee   . Hypertension   . Murmur, cardiac   . Neuropathy    Past Surgical History:  Procedure Laterality Date  . NECK SURGERY  2011  . RIGHT HEART CATH AND CORONARY ANGIOGRAPHY N/A 07/28/2018   Procedure: RIGHT HEART CATH AND CORONARY ANGIOGRAPHY;  Surgeon: Minna Merritts, MD;  Location: [facility];  Service: Cardiovascular;  Laterality: N/A;     Current Meds  Medication Sig  . Alpha-Lipoic Acid 600 MG CAPS Take 600 mg by mouth daily.  Marland Kitchen ALPRAZolam (XANAX) 0.5 MG tablet TAKE 1 TABLET BY MOUTH 2 TIMES A DAY AS NEEDED FOR ANXIETY (Patient taking differently: Take 0.5 mg by mouth 2 (two) times daily as needed for anxiety. )  . aspirin EC 81 MG tablet Take 81 mg by mouth every evening.   . Azelaic Acid 15 % cream Apply 1 application topically 2 (two) times daily. After skin is thoroughly washed and patted dry, gently but thoroughly massage a thin film of azelaic acid cream into the affected area twice daily, in the morning and evening.  . Carboxymethylcellul-Glycerin (LUBRICATING EYE DROPS OP) Place 1 drop into both eyes daily as needed (dry eyes).  . cholecalciferol (VITAMIN D3) 25 MCG (1000 UT) tablet Take 1,000 Units by mouth daily.  . furosemide (LASIX) 20 MG tablet Take 1 tablet (20 mg total) by mouth as needed (As needed  for leg swelling).  . Glucosamine HCl-MSM (GLUCOSAMINE-MSM PO) Take 2 tablets by mouth daily.  Marland Kitchen losartan (COZAAR) 100 MG tablet Take 1 tablet (100 mg total) by mouth daily.  . mirabegron ER (MYRBETRIQ) 50 MG TB24 tablet Take 1 tablet (50 mg total) by mouth daily.  . Multiple Vitamin (MULTI-VITAMINS) TABS Take 1 tablet by mouth every evening.   . Omega-3 1000 MG CAPS Take 2,000 mg by mouth daily at 2 PM.   . OVER THE COUNTER MEDICATION Take 1 tablet by mouth at bedtime as needed (sleep). Tri-sleep  . polyethylene glycol (MIRALAX / GLYCOLAX) packet Take 17 g by mouth 2 (two) times daily as needed for moderate constipation.  . sildenafil (REVATIO) 20 MG tablet Take up to 5 pills about 30 min prior to sex (Patient taking differently: Take 100 mg by mouth as needed (ed). )  . tretinoin (RETIN-A) 0.1 % cream Apply 1 application topically at bedtime.      Allergies:   Nickel   Social History   Tobacco Use  . Smoking status: Never Smoker  . Smokeless tobacco: Never Used  Substance Use Topics  . Alcohol use: Yes    Alcohol/week: 8.0 standard drinks    Types: 7 Glasses of wine, 1 Cans of beer per week  . Drug use: No     Current Outpatient Medications on File Prior to Visit  Medication Sig Dispense Refill  . Alpha-Lipoic  Acid 600 MG CAPS Take 600 mg by mouth daily.    Marland Kitchen ALPRAZolam (XANAX) 0.5 MG tablet TAKE 1 TABLET BY MOUTH 2 TIMES A DAY AS NEEDED FOR ANXIETY (Patient taking differently: Take 0.5 mg by mouth 2 (two) times daily as needed for anxiety. ) 60 tablet 2  . aspirin EC 81 MG tablet Take 81 mg by mouth every evening.     . Azelaic Acid 15 % cream Apply 1 application topically 2 (two) times daily. After skin is thoroughly washed and patted dry, gently but thoroughly massage a thin film of azelaic acid cream into the affected area twice daily, in the morning and evening.    . Carboxymethylcellul-Glycerin (LUBRICATING EYE DROPS OP) Place 1 drop into both eyes daily as needed (dry eyes).     . cholecalciferol (VITAMIN D3) 25 MCG (1000 UT) tablet Take 1,000 Units by mouth daily.    . furosemide (LASIX) 20 MG tablet Take 1 tablet (20 mg total) by mouth as needed (As needed for leg swelling). 30 tablet 6  . Glucosamine HCl-MSM (GLUCOSAMINE-MSM PO) Take 2 tablets by mouth daily.    Marland Kitchen losartan (COZAAR) 100 MG tablet Take 1 tablet (100 mg total) by mouth daily. 90 tablet 1  . mirabegron ER (MYRBETRIQ) 50 MG TB24 tablet Take 1 tablet (50 mg total) by mouth daily. 90 tablet 3  . Multiple Vitamin (MULTI-VITAMINS) TABS Take 1 tablet by mouth every evening.     . Omega-3 1000 MG CAPS Take 2,000 mg by mouth daily at 2 PM.     . OVER THE COUNTER MEDICATION Take 1 tablet by mouth at bedtime as needed (sleep). Tri-sleep    . polyethylene glycol (MIRALAX / GLYCOLAX) packet Take 17 g by mouth 2 (two) times daily as needed for moderate constipation.    . sildenafil (REVATIO) 20 MG tablet Take up to 5 pills about 30 min prior to sex (Patient taking differently: Take 100 mg by mouth as needed (ed). ) 50 tablet 8  . tretinoin (RETIN-A) 0.1 % cream Apply 1 application topically at bedtime.   0   No current facility-administered medications on file prior to visit.      Family Hx: The patient's family history includes Alzheimer's disease in his sister; Lung cancer in his father; Pancreatic cancer in his brother; Thyroid disease in his mother. There is no history of Prostate cancer, Bladder Cancer, or Kidney cancer.  ROS:   Please see the history of present illness.    Review of Systems  Constitutional: Negative.   Respiratory: Negative.   Cardiovascular: Negative.        Chest tightness  Gastrointestinal: Negative.   Musculoskeletal: Negative.   Neurological: Negative.   Psychiatric/Behavioral: Negative.   All other systems reviewed and are negative.     Labs/Other Tests and Data Reviewed:    Recent Labs: 07/25/2018: ALT 22; BUN 27; Creatinine, Ser 0.89; Hemoglobin 14.0; Platelets 178;  Potassium 4.3; Sodium 137   Recent Lipid Panel Lab Results  Component Value Date/Time   CHOL 171 09/29/2016 08:48 AM   CHOL 171 11/06/2013   TRIG 40 09/29/2016 08:48 AM   TRIG 45 11/06/2013   HDL 73 09/29/2016 08:48 AM   HDL 72 11/06/2013   CHOLHDL 2.3 09/29/2016 08:48 AM   LDLCALC 90 09/29/2016 08:48 AM    Wt Readings from Last 3 Encounters:  07/28/18 184 lb (83.5 kg)  05/23/18 194 lb (88 kg)  04/19/18 200 lb 9.6 oz (91 kg)  Exam:    Vital Signs: Vital signs may also be detailed in the HPI There were no vitals taken for this visit.  Wt Readings from Last 3 Encounters:  07/28/18 184 lb (83.5 kg)  05/23/18 194 lb (88 kg)  04/19/18 200 lb 9.6 oz (91 kg)   Temp Readings from Last 3 Encounters:  07/28/18 98.2 F (36.8 C) (Oral)  04/19/18 98.2 F (36.8 C) (Oral)  01/17/18 (!) 96.8 F (36 C) (Tympanic)   BP Readings from Last 3 Encounters:  07/28/18 114/65  05/23/18 135/75  04/19/18 114/67   Pulse Readings from Last 3 Encounters:  07/28/18 63  05/23/18 64  04/19/18 61    Weight 188 BP: 122/72 Pulse 58 Resp 16  Well nourished, well developed male in no acute distress. Constitutional:  oriented to person, place, and time. No distress.  Head: Normocephalic and atraumatic.  Eyes:  no discharge. No scleral icterus.  Neck: Normal range of motion. Neck supple.  Pulmonary/Chest: No audible wheezing, no distress, appears comfortable Musculoskeletal: Normal range of motion.  no  tenderness or deformity.  Neurological:   Coordination normal. Full exam not performed Skin:  No rash Psychiatric:  normal mood and affect. behavior is normal. Thought content normal.    ASSESSMENT & PLAN:    Severe aortic stenosis Some chest tightness and shortness of breath with exertion Severe stenosis by echo Unable to cross the valve during catheterization No significant coronary disease, normal right heart pressures We will set him up for consideration of TAVR, low risk patient   Anxiety Managed by primary care Reassurance provided Discussed limitations on his exercise, activities Discussed blood pressure  Essential hypertension Blood pressure is well controlled on today's visit. No changes made to the medications.  MGUS (monoclonal gammopathy of unknown significance) Managed by primary care   COVID-19 Education: The signs and symptoms of COVID-19 were discussed with the patient and how to seek care for testing (follow up with PCP or arrange E-visit).  The importance of social distancing was discussed today.  Patient Risk:   After full review of this patients clinical status, I feel that they are at least moderate risk at this time.  Time:   Today, I have spent 25 minutes with the patient with telehealth technology discussing the cardiac and medical problems/diagnoses detailed above   10 min spent reviewing the chart prior to patient visit today   Medication Adjustments/Labs and Tests Ordered: Current medicines are reviewed at length with the patient today.  Concerns regarding medicines are outlined above.   Tests Ordered: No tests ordered   Medication Changes: No changes made   Disposition: Follow-up in 6 months   Signed, Ida Rogue, MD  08/11/2018 12:35 PM    [place] Office [address] [place] [address], [place], [place] [address]

## 2018-08-15 ENCOUNTER — Ambulatory Visit: Payer: 59 | Admitting: Podiatry

## 2018-08-16 ENCOUNTER — Other Ambulatory Visit: Payer: Self-pay | Admitting: Family Medicine

## 2018-08-16 ENCOUNTER — Telehealth: Payer: Self-pay | Admitting: Family Medicine

## 2018-08-16 DIAGNOSIS — I10 Essential (primary) hypertension: Secondary | ICD-10-CM

## 2018-08-16 DIAGNOSIS — N400 Enlarged prostate without lower urinary tract symptoms: Secondary | ICD-10-CM

## 2018-08-16 DIAGNOSIS — D472 Monoclonal gammopathy: Secondary | ICD-10-CM

## 2018-08-16 DIAGNOSIS — I272 Pulmonary hypertension, unspecified: Secondary | ICD-10-CM

## 2018-08-16 DIAGNOSIS — R7309 Other abnormal glucose: Secondary | ICD-10-CM

## 2018-08-16 DIAGNOSIS — F418 Other specified anxiety disorders: Secondary | ICD-10-CM

## 2018-08-16 DIAGNOSIS — I7090 Unspecified atherosclerosis: Secondary | ICD-10-CM

## 2018-08-16 DIAGNOSIS — E559 Vitamin D deficiency, unspecified: Secondary | ICD-10-CM

## 2018-08-16 MED ORDER — ALPRAZOLAM 0.5 MG PO TABS: 0.5000 mg | ORAL_TABLET | Freq: Two times a day (BID) | ORAL | 2 refills | Status: DC | PRN

## 2018-08-16 NOTE — Telephone Encounter (Signed)
Patient called for refill of Alprazolam. Last order 10/2017, almost lasted 1 year on 60 pills +2 refill, he is taking appropriately, checked [place] CSRS database, no red flags.  I advised instead of apt tomorrow for refill, this is within range for his chronic dosing of alprazolam. He can re-schedule instead for August 2020 for his yearly check up / physical and blood work, re-ordered already for future labs  I have ordered his refill Alprazolam 0.5mg  BID PRN anxiety, #60  +2 refills - I had difficulty with e-scribing, unable to send due to error with E-signing. Tried multiple attempts, failed.  Called [facility] and gave pharmacist a verbal order for this med, it was accepted.  [name], DO [facility] 08/16/2018, 12:35 PM

## 2018-08-17 ENCOUNTER — Ambulatory Visit: Payer: Medicare Other | Admitting: Family Medicine

## 2018-08-22 ENCOUNTER — Other Ambulatory Visit: Payer: Self-pay

## 2018-08-22 ENCOUNTER — Ambulatory Visit (INDEPENDENT_AMBULATORY_CARE_PROVIDER_SITE_OTHER): Payer: Medicare Other | Admitting: Podiatry

## 2018-08-22 ENCOUNTER — Encounter: Payer: Self-pay | Admitting: Podiatry

## 2018-08-22 ENCOUNTER — Telehealth (INDEPENDENT_AMBULATORY_CARE_PROVIDER_SITE_OTHER): Payer: Medicare Other | Admitting: Cardiovascular Disease

## 2018-08-22 ENCOUNTER — Encounter: Payer: Self-pay | Admitting: Cardiovascular Disease

## 2018-08-22 VITALS — Temp 97.6°F

## 2018-08-22 VITALS — BP 115/67 | HR 62 | Ht 75.0 in | Wt 189.0 lb

## 2018-08-22 DIAGNOSIS — B351 Tinea unguium: Secondary | ICD-10-CM | POA: Diagnosis not present

## 2018-08-22 DIAGNOSIS — G629 Polyneuropathy, unspecified: Secondary | ICD-10-CM

## 2018-08-22 DIAGNOSIS — R5383 Other fatigue: Secondary | ICD-10-CM

## 2018-08-22 DIAGNOSIS — M79676 Pain in unspecified toe(s): Secondary | ICD-10-CM

## 2018-08-22 DIAGNOSIS — I350 Nonrheumatic aortic (valve) stenosis: Secondary | ICD-10-CM

## 2018-08-22 NOTE — Progress Notes (Signed)
Complaint:  Visit Type: Patient returns to my office for continued preventative foot care services. Complaint: Patient states" my nails have grown long and thick and become painful to walk and wear shoes.   The patient presents for preventative foot care services. No changes to ROS  Podiatric Exam: Vascular: dorsalis pedis and posterior tibial pulses are palpable bilateral. Capillary return is immediate. Temperature gradient is WNL. Skin turgor WNL  Sensorium: Normal Semmes Weinstein monofilament test. Normal tactile sensation bilaterally. Nail Exam: Pt has thick disfigured discolored nails with subungual debris noted bilateral entire nail hallux through fifth toenails Ulcer Exam: There is no evidence of ulcer or pre-ulcerative changes or infection. Orthopedic Exam: Muscle tone and strength are WNL. No limitations in general ROM. No crepitus or effusions noted. Foot type and digits show no abnormalities. Hallux malleus  B/L  Hammer toes  B/l. Skin: No Porokeratosis. No infection or ulcers.    Diagnosis:  Onychomycosis, , Pain in right toe, pain in left toes  Treatment & Plan Procedures and Treatment: Consent by patient was obtained for treatment procedures.   Debridement of mycotic and hypertrophic toenails, 1 through 5 bilateral and clearing of subungual debris. No ulceration, no infection noted.   Return Visit-Office Procedure: Patient instructed to return to the office for a follow up visit 10 weeks  for continued evaluation and treatment.    Gardiner Barefoot DPM

## 2018-08-22 NOTE — Progress Notes (Signed)
Virtual Visit via Video Note   This visit type was conducted due to national recommendations for restrictions regarding the COVID-19 Pandemic (e.g. social distancing) in an effort to limit this patient's exposure and mitigate transmission in our community.  Due to his co-morbid illnesses, this patient is at least at moderate risk for complications without adequate follow up.  This format is felt to be most appropriate for this patient at this time.  All issues noted in this document were discussed and addressed.  A limited physical exam was performed with this format.  Please refer to the patient's chart for his consent to telehealth for [facility].   Date:  08/23/2018   ID:  James Cardenas, DOB October 17, 1940, MRN [id number]  Patient Location: Home Provider Location: Home  PCP:  Olin Hauser, DO  Cardiologist:  No primary care provider on file.  Electrophysiologist:  None   Evaluation Performed:  New Patient Evaluation  Chief Complaint:  Severe aortic stenosis evaluation - referred by Dr Rockey Situ  History of Present Illness:    James Cardenas is a 78 y.o. male with a longstanding heart murmur, recently diagnosed with severe aortic stenosis, referred for TAVR evaluation.   The patient does not have symptoms concerning for COVID-19 infection (fever, chills, cough, or new shortness of breath).   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 He is divorced and lives alone in [place], [place]. He has had a heart murmur since he a young child. In fact, he underwent a heart catheterization at age 31 and was told his murmur was 'benign' at that time. He has always been told of the murmur during his adult life. Over the last 3-4 years he has developed neuropathy and has not participated in vigorous physical activity over recent years because of mild balance problems. Prior to that, he was extremely active with singles tennis and vigorous exercise without exertional symptoms.  He now has chest discomfort when lying on his left side. At other times, he feels 'heartburn' when lying flat. The discomfort is felt in the upper chest and left side. He denies exertional symptoms of chest pain, shortness of breath, or lightheadedness/syncope. He does complain of progressive fatigue. He also has had recent issues with anxiety and is very eager to proceed with treatment of his aortic valve disease as soon as possible.    The patient is otherwise very healthy. He has no limitation related to arthritis or other medical problems. He has mild knee arthritis that bothered him when he was playing a lot of tennis but no problems recently.   Past Medical History:  Diagnosis Date  . Anxiety   . Arthritis of knee   . Hypertension   . Murmur, cardiac   . Neuropathy    Past Surgical History:  Procedure Laterality Date  . NECK SURGERY  2011  . RIGHT HEART CATH AND CORONARY ANGIOGRAPHY N/A 07/28/2018   Procedure: RIGHT HEART CATH AND CORONARY ANGIOGRAPHY;  Surgeon: Minna Merritts, MD;  Location: [facility];  Service: Cardiovascular;  Laterality: N/A;     Current Meds  Medication Sig  . Alpha-Lipoic Acid 600 MG CAPS Take 600 mg by mouth daily.  Marland Kitchen ALPRAZolam (XANAX) 0.5 MG tablet Take 1 tablet (0.5 mg total) by mouth 2 (two) times daily as needed for anxiety.  Marland Kitchen aspirin EC 81 MG tablet Take 81 mg by mouth every evening.   . Azelaic Acid 15 % cream Apply 1  application topically 2 (two) times daily. After skin is thoroughly washed and patted dry, gently but thoroughly massage a thin film of azelaic acid cream into the affected area twice daily, in the morning and evening.  . Carboxymethylcellul-Glycerin (LUBRICATING EYE DROPS OP) Place 1 drop into both eyes daily as needed (dry eyes).  . cholecalciferol (VITAMIN D3) 25 MCG (1000 UT) tablet Take 1,000 Units by mouth daily.  . furosemide (LASIX) 20 MG tablet Take 1 tablet (20 mg total) by mouth as needed (As needed for leg  swelling).  . Glucosamine HCl-MSM (GLUCOSAMINE-MSM PO) Take 2 tablets by mouth daily.  Marland Kitchen losartan (COZAAR) 100 MG tablet Take 1 tablet (100 mg total) by mouth daily.  . mirabegron ER (MYRBETRIQ) 50 MG TB24 tablet Take 1 tablet (50 mg total) by mouth daily.  . Multiple Vitamin (MULTI-VITAMINS) TABS Take 1 tablet by mouth every evening.   . Omega-3 1000 MG CAPS Take 2,000 mg by mouth daily at 2 PM.   . OVER THE COUNTER MEDICATION Take 1 tablet by mouth at bedtime as needed (sleep). Tri-sleep  . polyethylene glycol (MIRALAX / GLYCOLAX) packet Take 17 g by mouth 2 (two) times daily as needed for moderate constipation.  . sildenafil (REVATIO) 20 MG tablet Take up to 5 pills about 30 min prior to sex  . tretinoin (RETIN-A) 0.1 % cream Apply 1 application topically at bedtime.      Allergies:   Nickel   Social History   Tobacco Use  . Smoking status: Never Smoker  . Smokeless tobacco: Never Used  Substance Use Topics  . Alcohol use: Yes    Alcohol/week: 8.0 standard drinks    Types: 7 Glasses of wine, 1 Cans of beer per week  . Drug use: No     Family Hx: The patient's family history includes Alzheimer's disease in his sister; Lung cancer in his father; Pancreatic cancer in his brother; Thyroid disease in his mother. There is no history of Prostate cancer, Bladder Cancer, or Kidney cancer.  ROS:   Please see the history of present illness.    Positive for generalized fatigue. All other systems reviewed and are negative.   Prior CV studies:   The following studies were reviewed today:  Echo: Study Conclusions  - Left ventricle: The cavity size was mildly dilated. There was   moderate concentric hypertrophy. Systolic function was normal.   The estimated ejection fraction was 55%. Wall motion was normal;   there were no regional wall motion abnormalities. Doppler   parameters are consistent with abnormal left ventricular   relaxation (grade 1 diastolic dysfunction). - Aortic  valve: There was moderate regurgitation. - Pulmonary arteries: PA peak pressure: 46 mm Hg (S).  Impressions:  - The right ventricular systolic pressure was increased consistent   with moderate pulmonary hypertension. [place] normal LVEF,   moderate LVH, with mild MR, and mild to moderate AR and severe   aortic stenosis.  Left ventricle:  The cavity size was mildly dilated. There was moderate concentric hypertrophy. Systolic function was normal. The estimated ejection fraction was 55%. Wall motion was normal; there were no regional wall motion abnormalities. Doppler parameters are consistent with abnormal left ventricular relaxation (grade 1 diastolic dysfunction).  ------------------------------------------------------------------- Aortic valve:   Doppler:  There was moderate regurgitation.    VTI ratio of LVOT to aortic valve: 0.18. Valve area (VTI): 0.55 cm^2. Indexed valve area (VTI): 0.25 cm^2/m^2. Peak velocity ratio of LVOT to aortic valve: 0.21. Valve area (Vmax): 0.67  cm^2. Indexed valve area (Vmax): 0.3 cm^2/m^2. Mean velocity ratio of LVOT to aortic valve: 0.16. Valve area (Vmean): 0.49 cm^2. Indexed valve area (Vmean): 0.22 cm^2/m^2.    Mean gradient (S): 37 mm Hg. Peak gradient (S): 62 mm Hg.  ------------------------------------------------------------------- Mitral valve:   Doppler:     Peak gradient (D): 3 mm Hg.  ------------------------------------------------------------------- Measurements   Left ventricle                           Value          Reference  LV ID, ED, PLAX chordal          (H)     52.6  mm       43 - 52  LV ID, ES, PLAX chordal          (H)     38.8  mm       23 - 38  LV fx shortening, PLAX chordal   (L)     26    %        >=29  LV PW thickness, ED                      11.6  mm       ----------  IVS/LV PW ratio, ED                      1.21           <=1.3  Stroke volume, 2D                        50    ml       ----------  Stroke  volume/bsa, 2D                    23    ml/m^2   ----------  LV ejection fraction, 1-p A4C            54    %        ----------  LV end-diastolic volume, 2-p             114   ml       ----------  LV end-systolic volume, 2-p              50    ml       ----------  LV ejection fraction, 2-p                56    %        ----------  Stroke volume, 2-p                       64    ml       ----------  LV end-diastolic volume/bsa, 2-p         52    ml/m^2   ----------  LV end-systolic volume/bsa, 2-p          23    ml/m^2   ----------  Stroke volume/bsa, 2-p                   28.9  ml/m^2   ----------  LV e&', lateral                           7.18  cm/s     ----------  LV E/e&', lateral                         11.62          ----------  LV e&', medial                            3.37  cm/s     ----------  LV E/e&', medial                          24.75          ----------  LV e&', average                           5.28  cm/s     ----------  LV E/e&', average                         15.81          ----------    Ventricular septum                       Value          Reference  IVS thickness, ED                        14    mm       ----------    LVOT                                     Value          Reference  LVOT ID, S                               20    mm       ----------  LVOT area                                3.14  cm^2     ----------  LVOT ID                                  20    mm       ----------  LVOT peak velocity, S                    84.4  cm/s     ----------  LVOT mean velocity, S                    43.8  cm/s     ----------  LVOT VTI, S                              16    cm       ----------  Stroke volume (SV), LVOT DP              50.3  ml       ----------  Stroke index (SV/bsa), LVOT DP           22.9  ml/m^2   ----------    Aortic valve                             Value          Reference  Aortic valve peak velocity, S            394   cm/s     ----------  Aortic valve  mean velocity, S            281   cm/s     ----------  Aortic valve VTI, S                      90.9  cm       ----------  Aortic mean gradient, S                  37    mm Hg    ----------  Aortic peak gradient, S                  62    mm Hg    ----------  VTI ratio, LVOT/AV                       0.18           ----------  Aortic valve area, VTI                   0.55  cm^2     ----------  Aortic valve area/bsa, VTI               0.25  cm^2/m^2 ----------  Velocity ratio, peak, LVOT/AV            0.21           ----------  Aortic valve area, peak velocity         0.67  cm^2     ----------  Aortic valve area/bsa, peak              0.3   cm^2/m^2 ----------  velocity  Velocity ratio, mean, LVOT/AV            0.16           ----------  Aortic valve area, mean velocity         0.49  cm^2     ----------  Aortic valve area/bsa, mean              0.22  cm^2/m^2 ----------  velocity    Aorta                                    Value          Reference  Aortic root ID, ED                       26    mm       ----------    Left atrium                              Value          Reference  LA ID, A-P, ES  44    mm       ----------  LA ID/bsa, A-P                           2     cm/m^2   <=2.2  LA volume, ES, 1-p A4C                   35.5  ml       ----------  LA volume/bsa, ES, 1-p A4C               16.1  ml/m^2   ----------    Mitral valve                             Value          Reference  Mitral E-wave peak velocity              83.4  cm/s     ----------  Mitral A-wave peak velocity              45.6  cm/s     ----------  Mitral deceleration time         (H)     250   ms       150 - 230  Mitral peak gradient, D                  3     mm Hg    ----------  Mitral E/A ratio, peak                   1.6            ----------    Pulmonary arteries                       Value          Reference  PA pressure, S, DP               (H)     46    mm Hg    <=30    Right  atrium                             Value          Reference  RA ID, S-I, ES, A4C              (H)     52.4  mm       34 - 49  RA area, ES, A4C                 (H)     23.1  cm^2     8.3 - 19.5  RA volume, ES, A/L                       82    ml       ----------  RA volume/bsa, ES, A/L                   37.3  ml/m^2   ----------    Right ventricle                          Value  Reference  RV ID, ED, PLAX                          31.4  mm       19 - 38  RV ID, minor axis, ED, A4C base          35.9  mm       ----------  TAPSE                                    32.7  mm       ----------  RV s&', lateral, S                        13.8  cm/s     ----------  Cath: Conclusion    LV end diastolic pressure is normal.  Hemodynamic findings consistent with pulmonary hypertension.   Procedural Findings:  Coronary angiography:  Coronary dominance: Right  Left mainstem:   Large vessel that bifurcates into the LAD and left circumflex, no significant disease noted  Left anterior descending (LAD):   Large vessel that extends to the apical region, diagonal branch 2 of moderate size, no significant disease noted  Left circumflex (LCx):  Large vessel with OM branch 2, no significant disease noted  Right coronary artery (RCA):  Right dominant vessel with PL and PDA, no significant disease noted  Left ventriculography: Left ventricular systolic function is normal, LVEF is estimated at 55-65%, there is no significant mitral regurgitation , no significant aortic valve stenosis  Right heart catheterization: RA pressure 9 RV pressure 55/4, mean of 9 PA pressure 46/14, mean 29 Tremendous difficulty obtaining wedge, despite multiple attempts with a wire Unable to gather PA venous saturations  Final Conclusions:  No significant coronary artery disease Unable to cross aortic valve for pressures or LV function Grossly normal right heart pressures  Recommendations:  Given the findings  above, we will refer to TAVR clinic in [facility] No complications  Labs/Other Tests and Data Reviewed:    EKG:  No ECG reviewed.  Recent Labs: 07/25/2018: ALT 22; BUN 27; Creatinine, Ser 0.89; Hemoglobin 14.0; Platelets 178; Potassium 4.3; Sodium 137   Recent Lipid Panel Lab Results  Component Value Date/Time   CHOL 171 09/29/2016 08:48 AM   CHOL 171 11/06/2013   TRIG 40 09/29/2016 08:48 AM   TRIG 45 11/06/2013   HDL 73 09/29/2016 08:48 AM   HDL 72 11/06/2013   CHOLHDL 2.3 09/29/2016 08:48 AM   LDLCALC 90 09/29/2016 08:48 AM    Wt Readings from Last 3 Encounters:  08/22/18 189 lb (85.7 kg)  07/28/18 184 lb (83.5 kg)  05/23/18 194 lb (88 kg)     Objective:    Vital Signs:  BP 115/67 (BP Location: Left Arm, Patient Position: Sitting, Cuff Size: Normal)   Pulse 62   Ht 6\' 3"  (1.905 m)   Wt 189 lb (85.7 kg)   BMI 23.62 kg/m    VITAL SIGNS:  reviewed  STS RISK CALCULATOR: Risk of Mortality:  1.025% Renal Failure:  1.215% Permanent Stroke:  1.269% Prolonged Ventilation:  3.580% DSW Infection:  0.058% Reoperation:  4.228% Morbidity or Mortality:  7.060% Short Length of Stay:  50.320% Long Length of Stay:  2.858%  ASSESSMENT & PLAN:    Severe, stage D1 aortic stenosis.  The patient describes symptoms of progressive fatigue  and somewhat atypical chest discomfort.  He otherwise appears to be minimally symptomatic.  I have reviewed the natural history of aortic stenosis with the patient today. We have discussed the limitations of medical therapy and the poor prognosis associated with symptomatic aortic stenosis. We have reviewed potential treatment options, including palliative medical therapy, conventional surgical aortic valve replacement, and transcatheter aortic valve replacement. We discussed treatment options in the context of the patient's specific comorbid medical conditions.   I have personally reviewed the patient's echo images and cardiac  catheterization data.  His echocardiogram demonstrates findings consistent with severe aortic stenosis with a mean transvalvular gradient greater than 40 mmHg, dimensionless index less than 0.25, and severe calcification and restriction of all 3 aortic valve leaflets on 2D imaging.  In addition he has moderate aortic insufficiency.  LV function is preserved and there is no concomitant valvular disease noted.  Cardiac catheterization demonstrates widely patent coronary arteries.  The aortic valve could not be crossed during that procedure.  The valve is visibly calcified on plain fluoroscopy.  I reviewed potential treatment strategies at length with the patient.  He has a strong preference to move forward with treatment as soon as possible.  He lives alone and is functionally independent, but is concerned about development of progressive symptoms and does not favor a strategy of watchful waiting.  I reviewed pros and cons of TAVR versus conventional surgical AVR with him at length today.  I reviewed the TAVR surgery in detail as well as relative advantages of improved short-term safety and quicker recovery.  He also understands potential disadvantages including more questions about longevity, higher likelihood of paravalvular regurgitation, and increased risk of pacemaker.  He will undergo CTA studies of the heart as well as the chest, abdomen, and pelvis.  He will then undergo formal surgical consultation as part of a multidisciplinary heart valve team approach to his care.  All of his questions are answered today.  COVID-19 Education: The signs and symptoms of COVID-19 were discussed with the patient and how to seek care for testing (follow up with PCP or arrange E-visit).  The importance of social distancing was discussed today.  Time:   Today, I have spent 60 minutes with the patient with telehealth technology discussing the above problems.     Medication Adjustments/Labs and Tests Ordered: Current  medicines are reviewed at length with the patient today.  Concerns regarding medicines are outlined above.   Tests Ordered: No orders of the defined types were placed in this encounter.   Medication Changes: No orders of the defined types were placed in this encounter.   Disposition:  Follow up As above  Signed, Sherren Mocha, MD  08/23/2018 6:29 PM    [place]

## 2018-08-23 ENCOUNTER — Encounter: Payer: Self-pay | Admitting: Cardiovascular Disease

## 2018-08-24 ENCOUNTER — Other Ambulatory Visit: Payer: 59 | Admitting: Orthotics

## 2018-08-25 ENCOUNTER — Ambulatory Visit (HOSPITAL_COMMUNITY): Payer: Medicare Other

## 2018-08-25 ENCOUNTER — Ambulatory Visit (HOSPITAL_COMMUNITY)
Admission: RE | Admit: 2018-08-25 | Discharge: 2018-08-25 | Disposition: A | Discharge status: Home / Self Care | Payer: Medicare Other | Source: Ambulatory Visit | Attending: Cardiovascular Disease | Admitting: Cardiovascular Disease

## 2018-08-25 ENCOUNTER — Other Ambulatory Visit: Payer: Self-pay

## 2018-08-25 ENCOUNTER — Ambulatory Visit (HOSPITAL_BASED_OUTPATIENT_CLINIC_OR_DEPARTMENT_OTHER)
Admission: RE | Admit: 2018-08-25 | Discharge: 2018-08-25 | Disposition: A | Discharge status: Home / Self Care | Payer: Medicare Other | Source: Ambulatory Visit | Attending: Cardiovascular Disease | Admitting: Cardiovascular Disease

## 2018-08-25 DIAGNOSIS — I350 Nonrheumatic aortic (valve) stenosis: Secondary | ICD-10-CM | POA: Diagnosis not present

## 2018-08-25 DIAGNOSIS — R5383 Other fatigue: Secondary | ICD-10-CM | POA: Diagnosis not present

## 2018-08-25 MED ORDER — IOHEXOL 350 MG/ML SOLN
100.0000 mL | Freq: Once | INTRAVENOUS | Status: AC | PRN
  Administered 2018-08-25: 100 mL via INTRAVENOUS

## 2018-08-30 ENCOUNTER — Encounter: Payer: Self-pay | Admitting: Physician Assistant

## 2018-09-06 ENCOUNTER — Encounter: Payer: Self-pay | Admitting: Surgery

## 2018-09-06 ENCOUNTER — Other Ambulatory Visit: Payer: Self-pay

## 2018-09-06 ENCOUNTER — Encounter: Payer: Self-pay | Admitting: Physical Therapy

## 2018-09-06 ENCOUNTER — Encounter: Payer: Medicare Other | Admitting: Surgery

## 2018-09-06 ENCOUNTER — Ambulatory Visit: Payer: Medicare Other | Attending: Cardiovascular Disease | Admitting: Physical Therapy

## 2018-09-06 ENCOUNTER — Institutional Professional Consult (permissible substitution) (INDEPENDENT_AMBULATORY_CARE_PROVIDER_SITE_OTHER): Payer: Medicare Other | Admitting: Surgery

## 2018-09-06 VITALS — BP 152/85 | HR 77 | Temp 99.0°F | Resp 16 | Ht 75.0 in | Wt 190.0 lb

## 2018-09-06 DIAGNOSIS — I350 Nonrheumatic aortic (valve) stenosis: Secondary | ICD-10-CM | POA: Diagnosis not present

## 2018-09-06 DIAGNOSIS — R2689 Other abnormalities of gait and mobility: Secondary | ICD-10-CM | POA: Diagnosis not present

## 2018-09-06 NOTE — Therapy (Signed)
[place], [place], [address] Phone: [phone]   Fax:  [phone]  Physical Therapy Evaluation  Patient Details  Name: James Cardenas MRN: [id number] Date of Birth: [birth year] Referring Provider (PT): Sherren Mocha MD   Encounter Date: 09/06/2018  PT End of Session - 09/06/18 1438    Visit Number  1    Number of Visits  1    Date for PT Re-Evaluation  09/06/18    PT Start Time  0258    PT Stop Time  1435    PT Time Calculation (min)  36 min    Activity Tolerance  Patient tolerated treatment well    Behavior During Therapy  [facility] for tasks assessed/performed       Past Medical History:  Diagnosis Date  . Anxiety   . Arthritis of knee   . Carotid artery disease ([place])   . Hypertension   . Neuropathy   . Severe aortic stenosis     Past Surgical History:  Procedure Laterality Date  . NECK SURGERY  2011  . RIGHT HEART CATH AND CORONARY ANGIOGRAPHY N/A 07/28/2018   Procedure: RIGHT HEART CATH AND CORONARY ANGIOGRAPHY;  Surgeon: Minna Merritts, MD;  Location: [facility];  Service: Cardiovascular;  Laterality: N/A;    There were no vitals filed for this visit.   Subjective Assessment - 09/06/18 1407    Subjective  pt is a 78 y.o with CC of feeling like his energy level isn't as high it could be which started around 6 months ago. he denies any SOB or chest pain/ tightness which. He denies any pain or discomfort aside from neuropathy in the legs.     Patient Stated Goals  to get heart better    Currently in Pain?  No/denies         [facility] PT Assessment - 09/06/18 1400      Assessment   Medical Diagnosis  Severe Aortic Stenosis    Referring Provider (PT)  Sherren Mocha MD    Onset Date/Surgical Date  --   6 months   Hand Dominance  Right      Precautions   Precautions  None      Restrictions   Weight Bearing Restrictions  No      Balance Screen   Has the patient fallen in  the past 6 months  No    Has the patient had a decrease in activity level because of a fear of falling?   No    Is the patient reluctant to leave their home because of a fear of falling?   No      Home Environment   Living Environment  Private residence    Living Arrangements  Alone    Type of [place] Access  Level entry    Home Layout  Two level    Alternate Level Stairs-Number of Steps  18    Alternate Level Stairs-Rails  Right   ascending   Home Equipment  None      Prior Function   Level of Independence  Independent with basic ADLs      ROM / Strength   AROM / PROM / Strength  AROM;Strength      AROM   Overall AROM   Within functional limits for tasks performed      Strength   Overall Strength  Within functional limits for tasks performed    Strength  Assessment Site  Hand    Right Hand Grip (lbs)  68    Left Hand Grip (lbs)  70      Ambulation/Gait   Ambulation/Gait  Yes    Gait Pattern  Decreased stride length;Antalgic;Wide base of support;Decreased trunk rotation;Trendelenburg       [facility] Pre-Surgical Assessment - 09/06/18 0001    5 Meter Walk Test- trial 1  3 sec    5 Meter Walk Test- trial 2  2 sec.     5 Meter Walk Test- trial 3  3 sec.    5 meter walk test average  2.67 sec    4 Stage Balance Test tolerated for:   5 sec.    4 Stage Balance Test Position  4    Sit To Stand Test- trial 1  12 sec.    Comment  increased postural sway    ADL/IADL Independent with:  Bathing;Dressing;Meal prep;Finances;Yard work    6 Minute Walk- Baseline  yes    BP (mmHg)  141/73    HR (bpm)  60    02 Sat (%RA)  98 %    Modified Borg Scale for Dyspnea  0- Nothing at all    Perceived Rate of Exertion (Borg)  6-    6 Minute Walk Post Test  yes    BP (mmHg)  151/83    HR (bpm)  105    02 Sat (%RA)  95 %    Modified Borg Scale for Dyspnea  0.5- Very, very slight shortness of breath    Perceived Rate of Exertion (Borg)  8-    Aerobic Endurance Distance Walked  1110     Endurance additional comments  pt is 35.80% limited compared to age related norm              Objective measurements completed on examination: See above findings.              PT Education - 09/06/18 1439    Education Details  with hx or peripheral neuropathy which is impacting balance, to be sure that he is scanning in environment for potential tripping hazards and when fatigued to take time avoid rushing which is when mistakes tend to occur    Person(s) Educated  Patient    Methods  Verbal cues;Explanation    Comprehension  Verbal cues required;Verbalized understanding                  Plan - 09/06/18 1438    Clinical Impression Statement  see assessment in note    Stability/Clinical Decision Making  Stable/Uncomplicated    Clinical Decision Making  Low    PT Next Visit Plan  Pre-TAVR evaluation    Consulted and Agree with Plan of Care  Patient      Clinical Impression Statement: Pt is a 78 yo M presenting to OP PT for evaluation prior to possible TAVR surgery due to severe aortic stenosis. Pt reports onset of fatigue approximately 6 months ago. Symptoms are limiting endurance. Pt presents with good ROM and strength, fair balance and is assessed as low at high fall risk 4 stage balance test, good walking speed and fair aerobic endurance per 6 minute walk test. Pt ambulated 1110 feet in without requiring rest break. At the end of 6 min test patient's HR was 105 bpm and O2 was 95 on room air. Pt reported .5/10 shortness of breath on modified scale for dyspnea.fatigue increased significantly with 6 minute walk test. Based on  the Short Physical Performance Battery, patient has a frailty rating of 11/12 with </= 5/12 considered frail.   Patient demonstrated the following deficits and impairments:     Visit Diagnosis: Other abnormalities of gait and mobility     Problem List Patient Active Problem List   Diagnosis Date Noted  . Pulmonary hypertension  ([place]) 05/02/2018  . LVH (left ventricular hypertrophy) due to hypertensive disease 05/02/2018  . Severe aortic valve stenosis 10/12/2017  . Dupuytren's contracture of left hand 10/12/2017  . MGUS (monoclonal gammopathy of unknown significance) 10/12/2017  . Pain in limb 07/06/2017  . Situational anxiety 07/02/2016  . OAB (overactive bladder) 07/02/2016  . Peripheral neuropathy 07/02/2016  . Atherosclerosis 07/02/2016  . Constipation 07/02/2016  . Osteoarthritis of multiple joints 07/02/2016  . Osteoarthritis of right knee 07/02/2016  . Bilateral lower extremity edema 07/02/2016  . ED (erectile dysfunction) 05/28/2016  . BPH without urinary obstruction 03/11/2016  . Essential hypertension 01/24/2014   Starr Lake PT, DPT, LAT, ATC  09/06/18  2:46 PM      [facility] [address] [place], [place], [address] Phone: [phone]   Fax:  [phone]  Name: James Cardenas MRN: [id number] Date of Birth: 1941/02/20

## 2018-09-06 NOTE — Progress Notes (Signed)
[facility]  CARDIOTHORACIC SURGERY CONSULTATION REPORT  Referring Provider is Cardenas, James November, MD Primary Cardiologist is No primary care provider on file. PCP is James Hauser, DO  Chief Complaint  Patient presents with   Aortic Stenosis    TAVR EVAL and review all required studies    HPI:  The patient is a 78 year old gentleman with a history of hypertension and peripheral neuropathy who reports having a heart murmur since he was a child.  He said that he underwent heart catheterization at age 72 and was told that his murmur was benign at that time.  He has always been very active as an adult playing tennis, exercising, and riding a bicycle but over the past few years has been slow due to development of peripheral neuropathy affecting his balance.  Over the past 6 months or so he has developed exertional fatigue as well as some chest tightness.  He denies any shortness of breath or orthopnea.  He has had some ankle and pedal edema.  He denies any dizziness or syncope.  His most recent echocardiogram on 04/29/2018 showed a severely calcified aortic valve with a mean gradient of 37 mmHg and a peak gradient of 62 mmHg.  There is moderate aortic insufficiency.  Left ventricular ejection fraction was 55% with moderate concentric left ventricular hypertrophy.  He underwent cardiac catheterization on 07/28/2018 showing no significant coronary disease.  Right heart pressures were normal.  The patient is a retired Chief Technology Officer.  He is divorced and lives alone in [place].  Past Medical History:  Diagnosis Date   Anxiety    Arthritis of knee    Carotid artery disease ([place])    Hypertension    Neuropathy    Severe aortic stenosis     Past Surgical History:  Procedure Laterality Date   NECK SURGERY  2011   RIGHT HEART CATH AND CORONARY ANGIOGRAPHY N/A 07/28/2018   Procedure: RIGHT HEART CATH AND CORONARY  ANGIOGRAPHY;  Surgeon: James Merritts, MD;  Location: [facility];  Service: Cardiovascular;  Laterality: N/A;    Family History  Problem Relation Age of Onset   Thyroid disease Mother    Lung cancer Father    Pancreatic cancer Brother    Alzheimer's disease Sister    Prostate cancer Neg Hx    Bladder Cancer Neg Hx    Kidney cancer Neg Hx     Social History   Socioeconomic History   Marital status: Divorced    Spouse name: Not on file   Number of children: Not on file   Years of education: Not on file   Highest education level: Bachelor's degree (e.g., BA, AB, BS)  Occupational History   Occupation: Retired Designer, multimedia, Public affairs consultant / heat treating)  Social Designer, fashion/clothing strain: Not hard at all   Food insecurity:    Worry: Never true    Inability: Never true   Transportation needs:    Medical: No    Non-medical: No  Tobacco Use   Smoking status: Never Smoker   Smokeless tobacco: Never Used  Substance and Sexual Activity   Alcohol use: Yes    Alcohol/week: 8.0 standard drinks    Types: 7 Glasses of wine, 1 Cans of beer per week   Drug use: No   Sexual activity: Yes  Lifestyle   Physical activity:    Days per week: 3 days    Minutes per session: 30  min   Stress: Not at all  Relationships   Social connections:    Talks on phone: More than three times a week    Gets together: More than three times a week    Attends religious service: More than 4 times per year    Active member of club or organization: No    Attends meetings of clubs or organizations: Never    Relationship status: Divorced   Intimate partner violence:    Fear of current or ex partner: No    Emotionally abused: No    Physically abused: No    Forced sexual activity: No  Other Topics Concern   Not on file  Social History Narrative   Not on file    Current Outpatient Medications  Medication Sig Dispense Refill   Alpha-Lipoic Acid 600 MG  CAPS Take 600 mg by mouth daily.     ALPRAZolam (XANAX) 0.5 MG tablet Take 1 tablet (0.5 mg total) by mouth 2 (two) times daily as needed for anxiety. 60 tablet 2   aspirin EC 81 MG tablet Take 81 mg by mouth every evening.      Azelaic Acid 15 % cream Apply 1 application topically 2 (two) times daily. After skin is thoroughly washed and patted dry, gently but thoroughly massage a thin film of azelaic acid cream into the affected area twice daily, in the morning and evening.     Carboxymethylcellul-Glycerin (LUBRICATING EYE DROPS OP) Place 1 drop into both eyes daily as needed (dry eyes).     cholecalciferol (VITAMIN D3) 25 MCG (1000 UT) tablet Take 1,000 Units by mouth daily.     furosemide (LASIX) 20 MG tablet Take 1 tablet (20 mg total) by mouth as needed (As needed for leg swelling). 30 tablet 6   Glucosamine HCl-MSM (GLUCOSAMINE-MSM PO) Take 2 tablets by mouth daily.     losartan (COZAAR) 100 MG tablet Take 1 tablet (100 mg total) by mouth daily. 90 tablet 1   mirabegron ER (MYRBETRIQ) 50 MG TB24 tablet Take 1 tablet (50 mg total) by mouth daily. 90 tablet 3   Multiple Vitamin (MULTI-VITAMINS) TABS Take 1 tablet by mouth every evening.      Omega-3 1000 MG CAPS Take 2,000 mg by mouth daily at 2 PM.      OVER THE COUNTER MEDICATION Take 1 tablet by mouth at bedtime as needed (sleep). Tri-sleep     polyethylene glycol (MIRALAX / GLYCOLAX) packet Take 17 g by mouth 2 (two) times daily as needed for moderate constipation.     sildenafil (REVATIO) 20 MG tablet Take up to 5 pills about 30 min prior to sex 50 tablet 8   tretinoin (RETIN-A) 0.1 % cream Apply 1 application topically at bedtime.   0   No current facility-administered medications for this visit.     Allergies  Allergen Reactions   Nickel Itching      Review of Systems:   General:  normal appetite, + decreased energy, no weight gain, no weight loss, no fever  Cardiac:  + mild chest pain with exertion, no chest  pain at rest, no SOB with  exertion, no resting SOB, no PND, no orthopnea, no palpitations, no arrhythmia, no atrial fibrillation, + LE edema, no dizzy spells, no syncope  Respiratory:  no shortness of breath, no home oxygen, no productive cough, no dry cough, no bronchitis, no wheezing, no hemoptysis, no asthma, no pain with inspiration or cough, no sleep apnea, no CPAP at night  GI:   no difficulty swallowing, no reflux, no frequent heartburn, no hiatal hernia, no abdominal pain, no constipation, no diarrhea, no hematochezia, no hematemesis, no melena  GU:   no dysuria,  no frequency, no urinary tract infection, no hematuria, no enlarged prostate, no kidney stones, no kidney disease  Vascular:  no pain suggestive of claudication, no pain in feet, no leg cramps, no varicose veins, no DVT, no non-healing foot ulcer  Neuro:   no stroke, no TIA's, no seizures, no headaches, no temporary blindness one eye,  no slurred speech, + peripheral neuropathy, no chronic pain, + instability of gait, no memory/cognitive dysfunction  Musculoskeletal: no arthritis, no joint swelling, no myalgias, + mild difficulty walking, normal mobility   Skin:   no rash, no itching, no skin infections, no pressure sores or ulcerations  Psych:   + anxiety, no depression, no nervousness, no unusual recent stress  Eyes:   no blurry vision, no floaters, no recent vision changes, + wears glasses or contacts  ENT:   no hearing loss, no loose or painful teeth, no dentures, last saw dentist 11/2017  Hematologic:  no easy bruising, no abnormal bleeding, no clotting disorder, no frequent epistaxis  Endocrine:  no diabetes, does not check CBG's at home      Physical Exam:   BP (!) 152/85 (BP Location: Right Arm, Patient Position: Sitting, Cuff Size: Normal)    Pulse 77    Temp 99 F (37.2 C) (Oral)    Resp 16    Ht 6\' 3"  (1.905 m)    Wt 190 lb (86.2 kg)    SpO2 96% Comment: ON RA   BMI 23.75 kg/m   General:  Elderly but  well-appearing  HEENT:  Unremarkable, NCAT, PERLA, EOMI  Neck:   no JVD, no bruits, no adenopathy or thyromegaly  Chest:   clear to auscultation, symmetrical breath sounds, no wheezes, no rhonchi   CV:   RRR, grade III/VI crescendo/decrescendo murmur heard best at RSB,  no diastolic murmur  Abdomen:  soft, non-tender, no masses   Extremities:  warm, well-perfused, pulses palpable in feet, mild bilateral LE edema  Rectal/GU  Deferred  Neuro:   Grossly non-focal and symmetrical throughout  Skin:   Clean and dry, no rashes, no breakdown   Diagnostic Tests:     *[facility]*                       [place], [place] [address]                            [phone]  ------------------------------------------------------------------- Transthoracic Echocardiography  Patient:    James Cardenas, James Cardenas MR #:       [id number] Study Date: 04/29/2018 Gender:     M Age:        42 Height:     190.5 cm Weight:     91 kg BSA:        2.2 m^2 Pt. Status: Room:   ATTENDING    Larina Earthly  REFERRING    Karamalegos, [place], Medical  cc:  ------------------------------------------------------------------- LV EF: 55%  ------------------------------------------------------------------- Indications:  Aortic stenosis 424.1 Murmur 785.2.  ------------------------------------------------------------------- History:   PMH:  Anxiety.  Murmur.  Risk factors:  Hypertension.   ------------------------------------------------------------------- Study Conclusions  - Left ventricle: The cavity size was mildly dilated. There was   moderate concentric hypertrophy. Systolic function was normal.   The estimated ejection fraction was 55%. Wall motion was normal;   there were no regional wall motion abnormalities. Doppler    parameters are consistent with abnormal left ventricular   relaxation (grade 1 diastolic dysfunction). - Aortic valve: There was moderate regurgitation. - Pulmonary arteries: PA peak pressure: 46 mm Hg (S).  Impressions:  - The right ventricular systolic pressure was increased consistent   with moderate pulmonary hypertension. [place] normal LVEF,   moderate LVH, with mild MR, and mild to moderate AR and severe   aortic stenosis.  ------------------------------------------------------------------- Study data:   Study status:  Routine.  Procedure:  The patient reported no pain pre or post test. Transthoracic echocardiography for left ventricular function evaluation, for right ventricular function evaluation, and for assessment of valvular function. Image quality was adequate.  Study completion:  There were no complications.          Transthoracic echocardiography.  M-mode, complete 2D, spectral Doppler, and color Doppler.  Birthdate: Patient birthdate: [birth year].  Age:  Patient is 78 yr old.  Sex: Gender: male.    BMI: 25.1 kg/m^2.  Blood pressure:     114/67 Patient status:  Outpatient.  Study date:  Study date: 04/29/2018. Study time: 12:58 PM.  Location:  [facility].  -------------------------------------------------------------------  ------------------------------------------------------------------- Left ventricle:  The cavity size was mildly dilated. There was moderate concentric hypertrophy. Systolic function was normal. The estimated ejection fraction was 55%. Wall motion was normal; there were no regional wall motion abnormalities. Doppler parameters are consistent with abnormal left ventricular relaxation (grade 1 diastolic dysfunction).  ------------------------------------------------------------------- Aortic valve:   Doppler:  There was moderate regurgitation.    VTI ratio of LVOT to aortic valve: 0.18. Valve area (VTI): 0.55 cm^2. Indexed valve area  (VTI): 0.25 cm^2/m^2. Peak velocity ratio of LVOT to aortic valve: 0.21. Valve area (Vmax): 0.67 cm^2. Indexed valve area (Vmax): 0.3 cm^2/m^2. Mean velocity ratio of LVOT to aortic valve: 0.16. Valve area (Vmean): 0.49 cm^2. Indexed valve area (Vmean): 0.22 cm^2/m^2.    Mean gradient (S): 37 mm Hg. Peak gradient (S): 62 mm Hg.  ------------------------------------------------------------------- Mitral valve:   Doppler:     Peak gradient (D): 3 mm Hg.  ------------------------------------------------------------------- Measurements   Left ventricle                           Value          Reference  LV ID, ED, PLAX chordal          (H)     52.6  mm       43 - 52  LV ID, ES, PLAX chordal          (H)     38.8  mm       23 - 38  LV fx shortening, PLAX chordal   (L)     26    %        >=29  LV PW thickness, ED                      11.6  mm       ----------  IVS/LV PW ratio, ED  1.21           <=1.3  Stroke volume, 2D                        50    ml       ----------  Stroke volume/bsa, 2D                    23    ml/m^2   ----------  LV ejection fraction, 1-p A4C            54    %        ----------  LV end-diastolic volume, 2-p             114   ml       ----------  LV end-systolic volume, 2-p              50    ml       ----------  LV ejection fraction, 2-p                56    %        ----------  Stroke volume, 2-p                       64    ml       ----------  LV end-diastolic volume/bsa, 2-p         52    ml/m^2   ----------  LV end-systolic volume/bsa, 2-p          23    ml/m^2   ----------  Stroke volume/bsa, 2-p                   28.9  ml/m^2   ----------  LV e&', lateral                           7.18  cm/s     ----------  LV E/e&', lateral                         11.62          ----------  LV e&', medial                            3.37  cm/s     ----------  LV E/e&', medial                          24.75          ----------  LV e&', average                            5.28  cm/s     ----------  LV E/e&', average                         15.81          ----------    Ventricular septum                       Value          Reference  IVS thickness, ED  14    mm       ----------    LVOT                                     Value          Reference  LVOT ID, S                               20    mm       ----------  LVOT area                                3.14  cm^2     ----------  LVOT ID                                  20    mm       ----------  LVOT peak velocity, S                    84.4  cm/s     ----------  LVOT mean velocity, S                    43.8  cm/s     ----------  LVOT VTI, S                              16    cm       ----------  Stroke volume (SV), LVOT DP              50.3  ml       ----------  Stroke index (SV/bsa), LVOT DP           22.9  ml/m^2   ----------    Aortic valve                             Value          Reference  Aortic valve peak velocity, S            394   cm/s     ----------  Aortic valve mean velocity, S            281   cm/s     ----------  Aortic valve VTI, S                      90.9  cm       ----------  Aortic mean gradient, S                  37    mm Hg    ----------  Aortic peak gradient, S                  62    mm Hg    ----------  VTI ratio, LVOT/AV                       0.18           ----------  Aortic valve area, VTI  0.55  cm^2     ----------  Aortic valve area/bsa, VTI               0.25  cm^2/m^2 ----------  Velocity ratio, peak, LVOT/AV            0.21           ----------  Aortic valve area, peak velocity         0.67  cm^2     ----------  Aortic valve area/bsa, peak              0.3   cm^2/m^2 ----------  velocity  Velocity ratio, mean, LVOT/AV            0.16           ----------  Aortic valve area, mean velocity         0.49  cm^2     ----------  Aortic valve area/bsa, mean              0.22  cm^2/m^2 ----------  velocity    Aorta                                     Value          Reference  Aortic root ID, ED                       26    mm       ----------    Left atrium                              Value          Reference  LA ID, A-P, ES                           44    mm       ----------  LA ID/bsa, A-P                           2     cm/m^2   <=2.2  LA volume, ES, 1-p A4C                   35.5  ml       ----------  LA volume/bsa, ES, 1-p A4C               16.1  ml/m^2   ----------    Mitral valve                             Value          Reference  Mitral E-wave peak velocity              83.4  cm/s     ----------  Mitral A-wave peak velocity              45.6  cm/s     ----------  Mitral deceleration time         (H)     250   ms       150 - 230  Mitral peak gradient, D  3     mm Hg    ----------  Mitral E/A ratio, peak                   1.6            ----------    Pulmonary arteries                       Value          Reference  PA pressure, S, DP               (H)     46    mm Hg    <=30    Right atrium                             Value          Reference  RA ID, S-I, ES, A4C              (H)     52.4  mm       34 - 49  RA area, ES, A4C                 (H)     23.1  cm^2     8.3 - 19.5  RA volume, ES, A/L                       82    ml       ----------  RA volume/bsa, ES, A/L                   37.3  ml/m^2   ----------    Right ventricle                          Value          Reference  RV ID, ED, PLAX                          31.4  mm       19 - 38  RV ID, minor axis, ED, A4C base          35.9  mm       ----------  TAPSE                                    32.7  mm       ----------  RV s&', lateral, S                        13.8  cm/s     ----------  Legend: (L)  and  (H)  mark values outside specified reference range.  ------------------------------------------------------------------- Prepared and Electronically Authenticated by  Neoma Laming,  MD 2020-01-17T11:40:53   Physicians   Panel Physicians Referring Physician Case Authorizing Physician  James Merritts, MD (Primary)  James Merritts, MD  Procedures   RIGHT HEART CATH AND CORONARY ANGIOGRAPHY  Conclusion    LV end diastolic pressure is normal.  Hemodynamic findings consistent with pulmonary hypertension.    Procedural Details   Technical Details Cardiac Catheterization Procedure Note  Name: James Cardenas MRN: [id number] DOB: 06-Sep-1940  Procedure: Left Heart  Cath, right heart catheterization , selective Coronary Angiography,   Indication:   78 year old gentleman with past medical history of Severe aortic valve stenosis, shortness of breath, chest tightness on exertion Also with history of Anxiety GERD Essential hypertension Overactive bladder Peripheral neuropathy MGUS (monoclonal gammopathy of unknown significance) -Presenting for cardiac catheterization for aortic valve stenosis, preop for possible TAVR   Procedural details: The right groin was prepped, draped, and anesthetized with 1% lidocaine. Using modified Seldinger technique, a 5 French sheath was introduced into the right femoral artery. Standard Judkins catheters (JL 4, JR 4 and pigtail catheter) were used for coronary angiography and left ventriculography. Catheter exchanges were performed over a guidewire. There were no immediate procedural complications. The patient was transferred to the post catheterization recovery area for further monitoring.  Moderate sedation: I was Face to Face with the patient during this time: (code: [phone])   Procedural Findings:  Coronary angiography:  Coronary dominance: Right  Left mainstem:   Large vessel that bifurcates into the LAD and left circumflex, no significant disease noted  Left anterior descending (LAD):   Large vessel that extends to the apical region, diagonal branch 2 of moderate size, no significant disease noted  Left circumflex  (LCx):  Large vessel with OM branch 2, no significant disease noted  Right coronary artery (RCA):  Right dominant vessel with PL and PDA, no significant disease noted  Left ventriculography: Left ventricular systolic function is normal, LVEF is estimated at 55-65%, there is no significant mitral regurgitation , no significant aortic valve stenosis  Right heart catheterization: RA pressure 9 RV pressure 55/4, mean of 9 PA pressure 46/14, mean 29 Tremendous difficulty obtaining wedge, despite multiple attempts with a wire Unable to gather PA venous saturations  Final Conclusions:  No significant coronary artery disease Unable to cross aortic valve for pressures or LV function Grossly normal right heart pressures  Recommendations:  Given the findings above, we will refer to TAVR clinic in [facility] No complications  Ida Rogue 07/28/2018, 4:51 PM  Estimated blood loss <50 mL.   During this procedure medications were administered to achieve and maintain moderate conscious sedation while the patient's heart rate, blood pressure, and oxygen saturation were continuously monitored and I was present face-to-face 100% of this time.  Medications  (Filter: Administrations occurring from 07/28/18 0931 to 07/28/18 1112)  Medication Rate/Dose/Volume Action  Date Time   fentaNYL (SUBLIMAZE) injection (mcg) 25 mcg Given 07/28/18 0946   Total dose as of 09/06/18 1511 25 mcg Given 1005   50 mcg        midazolam (VERSED) injection (mg) 1 mg Given 07/28/18 0946   Total dose as of 09/06/18 1511 1 mg Given 1005   2 mg        Heparin (Porcine) in NaCl 1000-0.9 UT/500ML-% SOLN (mL) 500 mL Given 07/28/18 0951   Total dose as of 09/06/18 1511 500 mL Given 0951   1,000 mL        iopamidol (ISOVUE-300) 61 % injection (mL) 50 mL Given 07/28/18 1058   Total dose as of 09/06/18 1511        50 mL        Sedation Time   Sedation Time Physician-1: 1 hour 29 seconds  Coronary Findings   Diagnostic   Dominance: Right  No diagnostic findings have been documented.  Intervention   No interventions have been documented.  Right Heart   Right Heart Pressures Hemodynamic findings consistent with pulmonary hypertension. LV EDP is normal.  Coronary Diagrams   Diagnostic  Dominance: Right    Intervention   Implants    Vascular Products  Device Closure Mynxgrip 22f [phone] - Implanted    Inventory item: DEVICE CLOSURE MYNXGRIP 33F Model/Cat number: [id number]  Manufacturer: [employer] Lot number: [id number]  Device identifier: [id number] Device identifier type: GS1  Area Of Implantation: Femoral Artery    GUDID Information   Request status Successful    Brand name: [employer] [place] Version/Model: UX3244  Company name: [employer] MRI safety info as of 07/28/18: Labeling does not contain MRI Safety Information  Contains dry or latex rubber: No    GMDN P.T. name: Wound hydrogel dressing, sterile    As of 07/28/2018   Status: Implanted      Syngo Images   Show images for CARDIAC CATHETERIZATION  Images on Long Term Storage   Show images for Ashaz, [place] to Procedure Log   Procedure Log    Hemo Data (last day) before discharge   AO Systolic Cath Pressure AO Diastolic Cath Pressure AO Mean Cath Pressure PA Systolic Cath Pressure PA Diastolic Cath Pressure PA Mean Cath Pressure RA Wedge A Wave RA Wedge V Wave RV Systolic Cath Pressure RV Diastolic Cath Pressure RV End Diastolic  -- -- -- -- -- -- 9 mmHg 12 mmHg -- -- --  116 54 mmHg 76 mmHg -- -- -- -- -- -- -- --  -- -- -- -- -- -- -- -- 55 mmHg 4 mmHg 9 mmHg  -- -- -- 46 mmHg 14 mmHg 29 mmHg         ADDENDUM REPORT: 08/25/2018 15:27  EXAM: OVER-READ INTERPRETATION  CT CHEST  The following report is an over-read performed by radiologist Dr. Samara Snide [facility] on 08/25/2018. This over-read does not include interpretation of cardiac or coronary anatomy or pathology. The cardiac  CTA interpretation by the cardiologist is attached.  COMPARISON:  10/18/2008 chest radiograph.  FINDINGS: Please see the separate concurrent chest CT angiogram report for details.  IMPRESSION: Please see the separate concurrent chest CT angiogram report for details.   Electronically Signed   By: Ilona Sorrel M.D.   On: 08/25/2018 15:27   Addended by Sharyn Blitz, MD on 08/25/2018 3:29 PM    Study Result   CLINICAL DATA:  Aortic Stenosis  EXAM: Cardiac TAVR CT  TECHNIQUE: The patient was scanned on a Siemens Force 010 slice scanner. A 120 kV retrospective scan was triggered in the ascending thoracic aorta at 140 HU's. Gantry rotation speed was 250 msecs and collimation was .6 mm. No beta blockade or nitro were given. The 3D data set was reconstructed in 5% intervals of the R-R cycle. Systolic and diastolic phases were analyzed on a dedicated work station using MPR, MIP and VRT modes. The patient received 80 cc of contrast.  FINDINGS: Aortic Valve: Tri leaflet calcified with restricted leaflet motion Central area of mal coaptation explaining central AR  Aorta: Normal arch vessels no aneurysm mild calcific aortic atherosclerosis  Sino-tubular Junction: 24 mm  Ascending Thoracic Aorta: 34 mm  Aortic Arch: 25 mm  Descending Thoracic Aorta: 23 mm  Sinus of Valsalva Measurements:  Non-coronary: 26.7 mm  Right - coronary: 26.6 mm  Left -   coronary: 29.4 mm  Coronary Artery Height above Annulus:  Left Main: 12.6 mm above annulus  Right Coronary: 14 mm above annulus  Virtual Basal Annulus Measurements:  Maximum / Minimum Diameter: 26.04 mm x 21.3  mm  Perimeter: 77.5 mm  Area: 465 mm2  Coronary Arteries: Sufficient height above annulus for deployment  Optimum Fluoroscopic Angle for Delivery: LAO 6 Caudal 6 degrees  IMPRESSION: 1. Tri leaflet AV with annular area of 465 mm2 suitable for a 26 mm Sapien 3  valve  2. Bulky nodular calcification at the base of the left coronary cusp at STJ  3.  Normal aortic root 3.4 cm  4.  Coronary arteries sufficient height above annulus for deployment  5.  Optimal angiographic angle for deployment LAO 6 Caudal 6 degrees  Jenkins Rouge  Electronically Signed: By: Jenkins Rouge M.D. On: 08/25/2018 12:27       CLINICAL DATA:  78 year old male with severe symptomatic aortic stenosis. Pre-TAVR evaluation.  EXAM: CT ANGIOGRAPHY CHEST, ABDOMEN AND PELVIS  TECHNIQUE: Multidetector CT imaging through the chest, abdomen and pelvis was performed using the standard protocol during bolus administration of intravenous contrast. Multiplanar reconstructed images and MIPs were obtained and reviewed to evaluate the vascular anatomy.  CONTRAST:  137mL OMNIPAQUE IOHEXOL 350 MG/ML SOLN  COMPARISON:  10/18/2008 chest radiograph.  FINDINGS: CTA CHEST FINDINGS  Cardiovascular: Mild cardiomegaly. No significant pericardial effusion/thickening. Marked thickening and coarse calcification of the aortic valve. Mildly atherosclerotic nonaneurysmal thoracic aorta. Normal caliber pulmonary arteries. No central pulmonary emboli.  Mediastinum/Nodes: No discrete thyroid nodules. Unremarkable esophagus. No pathologically enlarged axillary, mediastinal or hilar lymph nodes.  Lungs/Pleura: No pneumothorax. No pleural effusion. No acute consolidative airspace disease or lung masses. Two tiny scattered solid pulmonary nodules, largest 3 mm in the subpleural apical left upper lobe (series 15/image 12).  Musculoskeletal: No aggressive appearing focal osseous lesions. Marked thoracic spondylosis.  CTA ABDOMEN AND PELVIS FINDINGS  Hepatobiliary: Normal liver with no liver mass. Normal gallbladder with no radiopaque cholelithiasis. No biliary ductal dilatation.  Pancreas: Normal, with no mass or duct dilation.  Spleen: Normal size. No  mass.  Adrenals/Urinary Tract: Normal adrenals. No hydronephrosis. No contour deforming renal masses. Normal bladder.  Stomach/Bowel: Normal non-distended stomach. Normal caliber small bowel with no small bowel wall thickening. Normal appendix. Normal large bowel with no diverticulosis, large bowel wall thickening or pericolonic fat stranding.  Vascular/Lymphatic: Atherosclerotic nonaneurysmal abdominal aorta. Patent splenic and renal veins. No pathologically enlarged lymph nodes in the abdomen or pelvis.  Reproductive: Moderate prostatomegaly.  Other: No pneumoperitoneum, ascites or focal fluid collection.  Musculoskeletal: No aggressive appearing focal osseous lesions. Marked lumbar spondylosis.  VASCULAR MEASUREMENTS PERTINENT TO TAVR:  AORTA:  Minimal Aortic Diameter-17.4 x 15.9 mm  Severity of Aortic Calcification-mild  RIGHT PELVIS:  Right Common Iliac Artery -  Minimal Diameter-9.9 x 9.1 mm  Tortuosity-mild  Calcification-mild  Right External Iliac Artery -  Minimal Diameter-8.9 x 8.7 mm  Tortuosity-moderate  Calcification-none  Right Common Femoral Artery -  Minimal Diameter-9.0 x 8.3 mm  Tortuosity-mild  Calcification-minimal  LEFT PELVIS:  Left Common Iliac Artery -  Minimal Diameter-9.8 x 8.9 mm  Tortuosity-mild  Calcification-mild  Left External Iliac Artery -  Minimal Diameter-8.8 x 8.1 mm  Tortuosity-moderate  Calcification-none  Left Common Femoral Artery -  Minimal Diameter-7.7 x 6.9 mm  Tortuosity-mild  Calcification-mild  Review of the MIP images confirms the above findings.  IMPRESSION: 1. Vascular findings and measurements pertinent to potential TAVR procedure, as detailed. 2. Marked thickening and calcification of the aortic valve, compatible with the reported history of severe symptomatic aortic stenosis. 3. Mild cardiomegaly. 4. Two tiny solid pulmonary nodules,  largest 3 mm. No follow-up needed if patient is  low-risk (and has no known or suspected primary neoplasm). Non-contrast chest CT can be considered in 12 months if patient is high-risk. This recommendation follows the consensus statement: Guidelines for Management of Incidental Pulmonary Nodules Detected on CT Images:From the Fleischner Society 2017; published online before print (10.1148/radiol.[id number]). 5. Moderately enlarged prostate. 6. Aortic Atherosclerosis (ICD10-I70.0).   Electronically Signed   By: Ilona Sorrel M.D.   On: 08/25/2018 16:01   STS RISK CALCULATOR: Risk of Mortality:  1.025% Renal Failure:  1.215% Permanent Stroke:  1.269% Prolonged Ventilation:  3.580% DSW Infection:  0.058% Reoperation:  4.228% Morbidity or Mortality:  7.060% Short Length of Stay:  50.320% Long Length of Stay:  2.858%   Impression:  This 78 year old gentleman has stage D, severe, symptomatic aortic stenosis with New York Heart Association class II symptoms of exertional fatigue associated with atypical chest discomfort and some ankle and pedal edema.  He is not very active at the present time due to the coronavirus restrictions as well as his peripheral neuropathy and may have exertional shortness of breath if that was not the case.  I have personally reviewed his 2D echocardiogram, cardiac catheterization, and CTA studies.  His echocardiogram shows a calcified aortic valve with a mean gradient of 37 mmHg and a dimensionless index of 0.21 consistent with severe aortic stenosis.  There is also moderate aortic insufficiency.  Left ventricular systolic function is normal.  Cardiac catheterization showed no significant coronary disease.  There is mild pulmonary hypertension.  I agree that aortic valve replacement is indicated in this patient for relief of his symptoms and improvement of his quality of life as well as to prevent progressive left ventricular deterioration.  I think  transcatheter aortic valve replacement will be the best option given his age.  His gated cardiac CTA shows anatomy that is suitable for transcatheter aortic valve replacement using a Sapien 3 valve.  His annular area appears suitable for a 26 mm valve.  The right and left coronary height appear adequate.  His noncoronary and right coronary sinuses are only 27 mm and the sinotubular junction diameter is 24 mm but there is no calcium at the ST J.  There is some nodular calcification at the base of the left coronary cusp.  His abdominal and pelvic CTA shows adequate pelvic vascular anatomy to allow transfemoral insertion.  The patient was counseled at length regarding treatment alternatives for management of severe symptomatic aortic stenosis. The risks and benefits of surgical intervention has been discussed in detail. Long-term prognosis with medical therapy was discussed. Alternative approaches such as conventional surgical aortic valve replacement, transcatheter aortic valve replacement, and palliative medical therapy were compared and contrasted at length. This discussion was placed in the context of the patient's own specific clinical presentation and past medical history. All of his questions have been addressed.   Following the decision to proceed with transcatheter aortic valve replacement, a discussion was held regarding what types of management strategies would be attempted intraoperatively in the event of life-threatening complications, including whether or not the patient would be considered a candidate for the use of cardiopulmonary bypass and/or conversion to open sternotomy for attempted surgical intervention. The patient is aware of the fact that transient use of cardiopulmonary bypass may be necessary.  His surgical risk is very low and I think he would certainly be a candidate for sternotomy to manage any intraoperative complications. The patient has been advised of a variety of complications  that might develop including but not limited  to risks of death, stroke, paravalvular leak, aortic dissection or other major vascular complications, aortic annulus rupture, device embolization, cardiac rupture or perforation, mitral regurgitation, acute myocardial infarction, arrhythmia, heart block or bradycardia requiring permanent pacemaker placement, congestive heart failure, respiratory failure, renal failure, pneumonia, infection, other late complications related to structural valve deterioration or migration, or other complications that might ultimately cause a temporary or permanent loss of functional independence or other long term morbidity. The patient provides full informed consent for the procedure as described and all questions were answered.     Plan:  The patient will be scheduled for transfemoral transcatheter aortic valve replacement on Tuesday, September 13, 2018.  I spent 60 minutes performing this consultation and > 50% of this time was spent face to face counseling and coordinating the care of this patient's severe symptomatic aortic stenosis.   Gaye Pollack, MD 09/06/2018 3:00 PM

## 2018-09-08 ENCOUNTER — Encounter (HOSPITAL_COMMUNITY): Payer: Self-pay

## 2018-09-08 NOTE — Progress Notes (Signed)
[id number] - [place], [place] - [address] [address] Phone: [phone] Fax: [phone]      Your procedure is scheduled on Tuesday 09/13/2018.  Report to [facility] Main Entrance "A" at 05:30 A.M., and check in at the Admitting office.  Call this number if you have problems the morning of surgery:  [phone]  Call [phone] if you have any questions prior to your surgery date Monday-Friday 8am-4pm    Remember:  Do not eat or drink after midnight.     Take these medicines the morning of surgery with A SIP OF WATER: Alprazolam (Xanax) - if needed   7 days prior to surgery STOP taking any Aspirin (unless otherwise instructed by your surgeon), Aleve, Naproxen, Ibuprofen, Motrin, Advil, Goody's, BC's, all herbal medications, fish oil, and all vitamins.  Follow your surgeon's instructions on when to stop Aspirin.  If no instructions were given by your surgeon then you will need to call the office to get those instructions.       The Morning of Surgery  Do not wear jewelry, make-up or nail polish.  Do not wear lotions, powders, or perfumes/colognes, or deodorant  Do not shave 48 hours prior to surgery.  Men may shave face and neck.  Do not bring valuables to the hospital.  [facility] is not responsible for any belongings or valuables.  If you are a smoker, DO NOT Smoke 24 hours prior to surgery IF you wear a CPAP at night please bring your mask, tubing, and machine the morning of surgery   Remember that you must have someone to transport you home after your surgery, and remain with you for 24 hours if you are discharged the same day.   Contacts, glasses, hearing aids, dentures or bridgework may not be worn into surgery.    Leave your suitcase in the car.  After surgery it may be brought to your room.  For patients admitted to the hospital, discharge time will be determined by your treatment team.  Patients discharged the day of surgery  will not be allowed to drive home.    Special instructions:   [place]- Preparing For Surgery  Before surgery, you can play an important role. Because skin is not sterile, your skin needs to be as free of germs as possible. You can reduce the number of germs on your skin by washing with CHG (chlorahexidine gluconate) Soap before surgery.  CHG is an antiseptic cleaner which kills germs and bonds with the skin to continue killing germs even after washing.    Oral Hygiene is also important to reduce your risk of infection.  Remember - BRUSH YOUR TEETH THE MORNING OF SURGERY WITH YOUR REGULAR TOOTHPASTE  Please do not use if you have an allergy to CHG or antibacterial soaps. If your skin becomes reddened/irritated stop using the CHG.  Do not shave (including legs and underarms) for at least 48 hours prior to first CHG shower. It is OK to shave your face.  Please follow these instructions carefully.   1. Shower the NIGHT BEFORE SURGERY and the MORNING OF SURGERY with CHG Soap.   2. If you chose to wash your hair, wash your hair first as usual with your normal shampoo.  3. After you shampoo, rinse your hair and body thoroughly to remove the shampoo.  4. Use CHG as you would any other liquid soap. You can apply CHG directly to the skin and wash gently  with a scrungie or a clean washcloth.   5. Apply the CHG Soap to your body ONLY FROM THE NECK DOWN.  Do not use on open wounds or open sores. Avoid contact with your eyes, ears, mouth and genitals (private parts). Wash Face and genitals (private parts)  with your normal soap.   6. Wash thoroughly, paying special attention to the area where your surgery will be performed.  7. Thoroughly rinse your body with warm water from the neck down.  8. DO NOT shower/wash with your normal soap after using and rinsing off the CHG Soap.  9. Pat yourself dry with a CLEAN TOWEL.  10. Wear CLEAN PAJAMAS to bed the night before surgery, wear comfortable  clothes the morning of surgery  11. Place CLEAN SHEETS on your bed the night of your first shower and DO NOT SLEEP WITH PETS.    Day of Surgery:  Do not apply any deodorants/lotions.  Please wear clean clothes to the hospital/surgery center.   Remember to brush your teeth WITH YOUR REGULAR TOOTHPASTE.   Please read over the following fact sheets that you were given.

## 2018-09-09 ENCOUNTER — Other Ambulatory Visit (HOSPITAL_COMMUNITY)
Admission: RE | Admit: 2018-09-09 | Discharge: 2018-09-09 | Disposition: A | Discharge status: Home / Self Care | Payer: Medicare Other | Source: Ambulatory Visit | Attending: Cardiovascular Disease | Admitting: Cardiovascular Disease

## 2018-09-09 ENCOUNTER — Other Ambulatory Visit: Payer: Self-pay

## 2018-09-09 ENCOUNTER — Encounter (HOSPITAL_COMMUNITY): Payer: Self-pay

## 2018-09-09 ENCOUNTER — Encounter (HOSPITAL_COMMUNITY)
Admission: RE | Admit: 2018-09-09 | Discharge: 2018-09-09 | Disposition: A | Discharge status: Home / Self Care | Payer: Medicare Other | Source: Ambulatory Visit | Attending: Cardiovascular Disease | Admitting: Cardiovascular Disease

## 2018-09-09 ENCOUNTER — Other Ambulatory Visit (HOSPITAL_COMMUNITY): Payer: Medicare Other

## 2018-09-09 DIAGNOSIS — Z01818 Encounter for other preprocedural examination: Secondary | ICD-10-CM | POA: Insufficient documentation

## 2018-09-09 DIAGNOSIS — I350 Nonrheumatic aortic (valve) stenosis: Secondary | ICD-10-CM

## 2018-09-09 DIAGNOSIS — I4510 Unspecified right bundle-branch block: Secondary | ICD-10-CM | POA: Insufficient documentation

## 2018-09-09 DIAGNOSIS — Z1159 Encounter for screening for other viral diseases: Secondary | ICD-10-CM | POA: Insufficient documentation

## 2018-09-09 DIAGNOSIS — R9431 Abnormal electrocardiogram [ECG] [EKG]: Secondary | ICD-10-CM | POA: Insufficient documentation

## 2018-09-09 HISTORY — DX: Atherosclerotic heart disease of native coronary artery without angina pectoris: I25.10

## 2018-09-09 HISTORY — DX: Constipation, unspecified: K59.00

## 2018-09-09 LAB — CBC
HCT: 41.4 % (ref 39.0–52.0)
Hemoglobin: 13.8 g/dL (ref 13.0–17.0)
MCH: 32.2 pg (ref 26.0–34.0)
MCHC: 33.3 g/dL (ref 30.0–36.0)
MCV: 96.5 fL (ref 80.0–100.0)
Platelets: 166 10*3/uL (ref 150–400)
RBC: 4.29 MIL/uL (ref 4.22–5.81)
RDW: 13.2 % (ref 11.5–15.5)
WBC: 6 10*3/uL (ref 4.0–10.5)
nRBC: 0 % (ref 0.0–0.2)

## 2018-09-09 LAB — URINALYSIS, ROUTINE W REFLEX MICROSCOPIC
Bilirubin Urine: NEGATIVE
Glucose, UA: NEGATIVE mg/dL
Hgb urine dipstick: NEGATIVE
Ketones, ur: NEGATIVE mg/dL
Leukocytes,Ua: NEGATIVE
Nitrite: NEGATIVE
Protein, ur: NEGATIVE mg/dL
Specific Gravity, Urine: 1.009 (ref 1.005–1.030)
pH: 7 (ref 5.0–8.0)

## 2018-09-09 LAB — COMPREHENSIVE METABOLIC PANEL
ALT: 39 U/L (ref 0–44)
AST: 37 U/L (ref 15–41)
Albumin: 3.9 g/dL (ref 3.5–5.0)
Alkaline Phosphatase: 72 U/L (ref 38–126)
Anion gap: 9 (ref 5–15)
BUN: 16 mg/dL (ref 8–23)
CO2: 20 mmol/L — ABNORMAL LOW (ref 22–32)
Calcium: 9.1 mg/dL (ref 8.9–10.3)
Chloride: 107 mmol/L (ref 98–111)
Creatinine, Ser: 0.86 mg/dL (ref 0.61–1.24)
GFR calc Af Amer: >60 mL/min (ref >60)
GFR calc non Af Amer: >60 mL/min (ref >60)
Glucose, Bld: 114 mg/dL — ABNORMAL HIGH (ref 70–99)
Potassium: 4.7 mmol/L (ref 3.5–5.1)
Sodium: 136 mmol/L (ref 135–145)
Total Bilirubin: 1.6 mg/dL — ABNORMAL HIGH (ref 0.3–1.2)
Total Protein: 6.8 g/dL (ref 6.5–8.1)

## 2018-09-09 LAB — TYPE AND SCREEN
ABO/RH(D): A POS
Antibody Screen: NEGATIVE

## 2018-09-09 LAB — BRAIN NATRIURETIC PEPTIDE: B Natriuretic Peptide: 317.3 pg/mL — ABNORMAL HIGH (ref 0.0–100.0)

## 2018-09-09 LAB — BLOOD GAS, ARTERIAL
Acid-Base Excess: 0.9 mmol/L (ref 0.0–2.0)
Bicarbonate: 24.2 mmol/L (ref 20.0–28.0)
Drawn by: 421801
FIO2: 21
O2 Saturation: 98.6 %
Patient temperature: 98.6
pCO2 arterial: 33.8 mmHg (ref 32.0–48.0)
pH, Arterial: 7.468 — ABNORMAL HIGH (ref 7.350–7.450)
pO2, Arterial: 126 mmHg — ABNORMAL HIGH (ref 83.0–108.0)

## 2018-09-09 LAB — SURGICAL PCR SCREEN
MRSA, PCR: NEGATIVE
Staphylococcus aureus: NEGATIVE

## 2018-09-09 LAB — PROTIME-INR
INR: 1.1 (ref 0.8–1.2)
Prothrombin Time: 14.3 seconds (ref 11.4–15.2)

## 2018-09-09 LAB — HEMOGLOBIN A1C
Hgb A1c MFr Bld: 5.5 % (ref 4.8–5.6)
Mean Plasma Glucose: 111.15 mg/dL

## 2018-09-09 LAB — APTT: aPTT: 28 seconds (ref 24–36)

## 2018-09-09 LAB — ABO/RH: ABO/RH(D): A POS

## 2018-09-09 NOTE — Progress Notes (Signed)
PCP - Dr Quintella Reichert  Cardiologist - Dr Rockey Situ  Chest x-ray - 09/09/2018  EKG - 09/09/2018  Stress Test - no  ECHO - 04/29/2018  Cardiac Cath - 07/28/2018   Sleep Study - no CPAP - no   ASA- not taking the day of surgery  ERAS-no  HA1C-5.5 Fasting Blood Sugar - o Checks Blood Sugar ___0__ times a day   Pt denies having chest pain, sob, or fever at this time. All instructions explained to the pt, with a verbal understanding of the material. Pt agrees to go over the instructions while at home for a better understanding. The opportunity to ask questions was provided.  James Cardenas denies that he nor her family has experienced any of the following: Cough Fever >100.4 Runny Nose Sore Throat Difficulty breathing/ shortness of breath Travel in past 14 days- no  Patient was tested today and is aware that he must go home and qurarrtine.  Patient's son will be coming in town this weekend and staying with patient.  I instructed patient that they both should wear mask and social distance.

## 2018-09-09 NOTE — Progress Notes (Signed)
Your procedure is scheduled on Tuesday 09/13/2018.  Report to [facility] Main Entrance "A" at 05:30 A.M., and check in at the Admitting office.    Call this number if you have problems the morning of surgery:  [phone] - this is the Pre- Op Desk  Call [phone] if you have any questions prior to your surgery date Monday-Friday 8am-4pm    Remember:  Do not eat or drink after midnight, Monday June 1.     Take these medicines th e morning of surgery with A SIP OF WATER: Alprazolam (Xanax) - if needed   7 days prior to surgery STOP taking any Aspirin (unless otherwise instructed by your surgeon), Aleve, Naproxen, Ibuprofen, Motrin, Advil, Goody's, BC's, all herbal medications, fish oil, and all vitamins.  Follow your surgeon's instructions on when to stop Aspirin.  If no instructions were given by your surgeon then you will need to call the office to get those instructions.       The Morning of Surgery  Do not wear jewelry, make-up or nail polish.  Do not wear lotions, powders, or perfumes/colognes, or deodorant  Do not shave 48 hours prior to surgery.  Men may shave face and neck.  Do not bring valuables to the hospital.  [facility] is not responsible for any belongings or valuables.  If you are a smoker, DO NOT Smoke 24 hours prior to surgery IF you wear a CPAP at night please bring your mask, tubing, and machine the morning of surgery   Remember that you must have someone to transport you home after your surgery, and remain with you for 24 hours if you are discharged the same day.  Contacts, glasses, hearing aids, dentures or bridgework may not be worn into surgery.    Leave your suitcase in the car.  After surgery it may be brought to your room.  For patients admitted to the hospital, discharge time will be determined by your treatment team.  Patients discharged the day of surgery will not be allowed to drive home.    Special instructions:   [place]-  Preparing For Surgery  Before surgery, you can play an important role. Because skin is not sterile, your skin needs to be as free of germs as possible. You can reduce the number of germs on your skin by washing with CHG (chlorahexidine gluconate) Soap before surgery.  CHG is an antiseptic cleaner which kills germs and bonds with the skin to continue killing germs even after washing.    Oral Hygiene is also important to reduce your risk of infection.  Remember - BRUSH YOUR TEETH THE MORNING OF SURGERY WITH YOUR REGULAR TOOTHPASTE  Please do not use if you have an allergy to CHG or antibacterial soaps. If your skin becomes reddened/irritated stop using the CHG.  Do not shave (including legs and underarms) for at least 48 hours prior to first CHG shower. It is OK to shave your face.  Please follow these instructions carefully.   1. Shower the NIGHT BEFORE SURGERY and the MORNING OF SURGERY with CHG Soap.   2. If you chose to wash your hair, wash your hair first as usual with your normal shampoo.  3. After you shampoo, wash your face and private area with the soap you use at home, then rinse your hair and body thoroughly to remove the shampoo and soap.  4. Use CHG as you would any other liquid soap. You can apply CHG directly to  the skin and wash gently with a scrungie or a clean washcloth.   5. Apply the CHG Soap to your body ONLY FROM THE NECK DOWN.  Do not use on open wounds or open sores. Avoid contact with your eyes, ears, mouth and genitals (private parts).  6. Wash thoroughly, paying special attention to the area where your surgery will be performed.  7. Thoroughly rinse your body with warm water from the neck down.  8. DO NOT shower/wash with your normal soap after using and rinsing off the CHG Soap.  9. Pat yourself dry with a CLEAN TOWEL.  10. Wear CLEAN PAJAMAS to bed the night before surgery, wear comfortable clothes the morning of surgery  11. Place CLEAN SHEETS on your bed  the night of your first shower and DO NOT SLEEP WITH PETS.    Day of Surgery:   Shower as Instructed above   Do not apply any deodorants/lotions.  Please wear clean clothes to the hospital/surgery center.   Remember to brush your teeth WITH YOUR REGULAR TOOTHPASTE.   Please read over the following fact sheets that you were given.

## 2018-09-10 LAB — NOVEL CORONAVIRUS, NAA (HOSP ORDER, SEND-OUT TO REF LAB; TAT 18-24 HRS): SARS-CoV-2, NAA: NOT DETECTED

## 2018-09-12 MED ORDER — VANCOMYCIN HCL 10 G IV SOLR
1500.0000 mg | INTRAVENOUS | Status: AC
  Administered 2018-09-13: 1500 mg via INTRAVENOUS
  Filled 2018-09-12: qty 1000

## 2018-09-12 MED ORDER — SODIUM CHLORIDE 0.9 % IV SOLN
INTRAVENOUS | Status: DC
  Filled 2018-09-12: qty 30

## 2018-09-12 MED ORDER — SODIUM CHLORIDE 0.9 % IV SOLN
1.5000 g | INTRAVENOUS | Status: AC
  Administered 2018-09-13: 1.5 g via INTRAVENOUS
  Filled 2018-09-12 (×2): qty 1.5

## 2018-09-12 MED ORDER — DEXMEDETOMIDINE HCL IN NACL 400 MCG/100ML IV SOLN
0.1000 ug/kg/h | INTRAVENOUS | Status: AC
  Administered 2018-09-13: 1 ug/kg/h via INTRAVENOUS
  Filled 2018-09-12: qty 100

## 2018-09-12 MED ORDER — MAGNESIUM SULFATE 50 % IJ SOLN
40.0000 meq | INTRAMUSCULAR | Status: DC
  Filled 2018-09-12: qty 9.85

## 2018-09-12 MED ORDER — NOREPINEPHRINE BITARTRATE 1 MG/ML IV SOLN
0.0000 ug/min | INTRAVENOUS | Status: AC
  Administered 2018-09-13: 2 ug/min via INTRAVENOUS
  Filled 2018-09-12: qty 4

## 2018-09-12 MED ORDER — POTASSIUM CHLORIDE 2 MEQ/ML IV SOLN
80.0000 meq | INTRAVENOUS | Status: DC
  Filled 2018-09-12: qty 40

## 2018-09-12 NOTE — H&P (Signed)
[address]       [place] [address]             [phone]      Cardiothoracic Surgery Admission History and Physical   Referring Provider is Gollan, Kathlene November, MD Primary Cardiologist is No primary care provider on file. PCP is Olin Hauser, DO      Chief Complaint  Patient presents with   Aortic Stenosis        HPI:  The patient is a 78 year old gentleman with a history of hypertension and peripheral neuropathy who reports having a heart murmur since he was a child.  He said that he underwent heart catheterization at age 36 and was told that his murmur was benign at that time.  He has always been very active as an adult playing tennis, exercising, and riding a bicycle but over the past few years has been slow due to development of peripheral neuropathy affecting his balance.  Over the past 6 months or so he has developed exertional fatigue as well as some chest tightness.  He denies any shortness of breath or orthopnea.  He has had some ankle and pedal edema.  He denies any dizziness or syncope.  His most recent echocardiogram on 04/29/2018 showed a severely calcified aortic valve with a mean gradient of 37 mmHg and a peak gradient of 62 mmHg.  There is moderate aortic insufficiency.  Left ventricular ejection fraction was 55% with moderate concentric left ventricular hypertrophy.  He underwent cardiac catheterization on 07/28/2018 showing no significant coronary disease.  Right heart pressures were normal.  The patient is a retired Chief Technology Officer.  He is divorced and lives alone in [place].      Past Medical History:  Diagnosis Date   Anxiety    Arthritis of knee    Carotid artery disease ([place])    Hypertension    Neuropathy    Severe aortic stenosis          Past Surgical History:  Procedure Laterality Date   NECK SURGERY  2011   RIGHT HEART CATH AND CORONARY ANGIOGRAPHY N/A 07/28/2018    Procedure: RIGHT HEART CATH AND CORONARY ANGIOGRAPHY;  Surgeon: Minna Merritts, MD;  Location: [facility];  Service: Cardiovascular;  Laterality: N/A;         Family History  Problem Relation Age of Onset   Thyroid disease Mother    Lung cancer Father    Pancreatic cancer Brother    Alzheimer's disease Sister    Prostate cancer Neg Hx    Bladder Cancer Neg Hx    Kidney cancer Neg Hx     Social History        Socioeconomic History   Marital status: Divorced    Spouse name: Not on file   Number of children: Not on file   Years of education: Not on file   Highest education level: Bachelor's degree (e.g., BA, AB, BS)  Occupational History   Occupation: Retired Designer, multimedia, Public affairs consultant / heat treating)  Social Designer, fashion/clothing strain: Not hard at all   Food insecurity:    Worry: Never true    Inability: Never true   Transportation needs:    Medical: No    Non-medical: No  Tobacco Use   Smoking status: Never Smoker   Smokeless tobacco: Never Used  Substance and Sexual Activity   Alcohol use: Yes    Alcohol/week: 8.0 standard drinks  Types: 7 Glasses of wine, 1 Cans of beer per week   Drug use: No   Sexual activity: Yes  Lifestyle   Physical activity:    Days per week: 3 days    Minutes per session: 30 min   Stress: Not at all  Relationships   Social connections:    Talks on phone: More than three times a week    Gets together: More than three times a week    Attends religious service: More than 4 times per year    Active member of club or organization: No    Attends meetings of clubs or organizations: Never    Relationship status: Divorced   Intimate partner violence:    Fear of current or ex partner: No    Emotionally abused: No    Physically abused: No    Forced sexual activity: No  Other Topics Concern   Not on file  Social History Narrative   Not on  file          Current Outpatient Medications  Medication Sig Dispense Refill   Alpha-Lipoic Acid 600 MG CAPS Take 600 mg by mouth daily.     ALPRAZolam (XANAX) 0.5 MG tablet Take 1 tablet (0.5 mg total) by mouth 2 (two) times daily as needed for anxiety. 60 tablet 2   aspirin EC 81 MG tablet Take 81 mg by mouth every evening.      Azelaic Acid 15 % cream Apply 1 application topically 2 (two) times daily. After skin is thoroughly washed and patted dry, gently but thoroughly massage a thin film of azelaic acid cream into the affected area twice daily, in the morning and evening.     Carboxymethylcellul-Glycerin (LUBRICATING EYE DROPS OP) Place 1 drop into both eyes daily as needed (dry eyes).     cholecalciferol (VITAMIN D3) 25 MCG (1000 UT) tablet Take 1,000 Units by mouth daily.     furosemide (LASIX) 20 MG tablet Take 1 tablet (20 mg total) by mouth as needed (As needed for leg swelling). 30 tablet 6   Glucosamine HCl-MSM (GLUCOSAMINE-MSM PO) Take 2 tablets by mouth daily.     losartan (COZAAR) 100 MG tablet Take 1 tablet (100 mg total) by mouth daily. 90 tablet 1   mirabegron ER (MYRBETRIQ) 50 MG TB24 tablet Take 1 tablet (50 mg total) by mouth daily. 90 tablet 3   Multiple Vitamin (MULTI-VITAMINS) TABS Take 1 tablet by mouth every evening.      Omega-3 1000 MG CAPS Take 2,000 mg by mouth daily at 2 PM.      OVER THE COUNTER MEDICATION Take 1 tablet by mouth at bedtime as needed (sleep). Tri-sleep     polyethylene glycol (MIRALAX / GLYCOLAX) packet Take 17 g by mouth 2 (two) times daily as needed for moderate constipation.     sildenafil (REVATIO) 20 MG tablet Take up to 5 pills about 30 min prior to sex 50 tablet 8   tretinoin (RETIN-A) 0.1 % cream Apply 1 application topically at bedtime.   0   No current facility-administered medications for this visit.         Allergies  Allergen Reactions   Nickel Itching      Review of  Systems:              General:                      normal appetite, + decreased energy, no weight gain, no  weight loss, no fever             Cardiac:                       + mild chest pain with exertion, no chest pain at rest, no SOB with  exertion, no resting SOB, no PND, no orthopnea, no palpitations, no arrhythmia, no atrial fibrillation, + LE edema, no dizzy spells, no syncope             Respiratory:                 no shortness of breath, no home oxygen, no productive cough, no dry cough, no bronchitis, no wheezing, no hemoptysis, no asthma, no pain with inspiration or cough, no sleep apnea, no CPAP at night             GI:                               no difficulty swallowing, no reflux, no frequent heartburn, no hiatal hernia, no abdominal pain, no constipation, no diarrhea, no hematochezia, no hematemesis, no melena             GU:                              no dysuria,  no frequency, no urinary tract infection, no hematuria, no enlarged prostate, no kidney stones, no kidney disease             Vascular:                     no pain suggestive of claudication, no pain in feet, no leg cramps, no varicose veins, no DVT, no non-healing foot ulcer             Neuro:                         no stroke, no TIA's, no seizures, no headaches, no temporary blindness one eye,  no slurred speech, + peripheral neuropathy, no chronic pain, + instability of gait, no memory/cognitive dysfunction             Musculoskeletal:         no arthritis, no joint swelling, no myalgias, + mild difficulty walking, normal mobility              Skin:                            no rash, no itching, no skin infections, no pressure sores or ulcerations             Psych:                         + anxiety, no depression, no nervousness, no unusual recent stress             Eyes:                           no blurry vision, no floaters, no recent vision changes, + wears glasses or contacts             ENT:  no hearing loss, no loose or painful teeth, no dentures, last saw dentist 11/2017             Hematologic:               no easy bruising, no abnormal bleeding, no clotting disorder, no frequent epistaxis             Endocrine:                   no diabetes, does not check CBG's at home                            Physical Exam:              BP (!) 152/85 (BP Location: Right Arm, Patient Position: Sitting, Cuff Size: Normal)    Pulse 77    Temp 99 F (37.2 C) (Oral)    Resp 16    Ht 6\' 3"  (1.905 m)    Wt 190 lb (86.2 kg)    SpO2 96% Comment: ON RA   BMI 23.75 kg/m              General:                      Elderly but well-appearing             HEENT:                       Unremarkable, NCAT, PERLA, EOMI             Neck:                           no JVD, no bruits, no adenopathy or thyromegaly             Chest:                          clear to auscultation, symmetrical breath sounds, no wheezes, no rhonchi              CV:                              RRR, grade III/VI crescendo/decrescendo murmur heard best at RSB,  no diastolic murmur             Abdomen:                    soft, non-tender, no masses              Extremities:                 warm, well-perfused, pulses palpable in feet, mild bilateral LE edema             Rectal/GU                   Deferred             Neuro:                         Grossly non-focal and symmetrical throughout             Skin:  Clean and dry, no rashes, no breakdown   Diagnostic Tests:  *[place], [place] [address] [phone]  ------------------------------------------------------------------- Transthoracic Echocardiography  Patient: James Cardenas, James Cardenas MR #: [id number] Study Date: 04/29/2018 Gender: M Age: 67 Height: 190.5 cm Weight: 91 kg BSA:  2.2 m^2 Pt. Status: Room:  ATTENDING Larina Earthly REFERRING Karamalegos, [place], Medical  cc:  ------------------------------------------------------------------- LV EF: 55%  ------------------------------------------------------------------- Indications: Aortic stenosis 424.1 Murmur 785.2.  ------------------------------------------------------------------- History: PMH: Anxiety. Murmur. Risk factors: Hypertension.  ------------------------------------------------------------------- Study Conclusions  - Left ventricle: The cavity size was mildly dilated. There was moderate concentric hypertrophy. Systolic function was normal. The estimated ejection fraction was 55%. Wall motion was normal; there were no regional wall motion abnormalities. Doppler parameters are consistent with abnormal left ventricular relaxation (grade 1 diastolic dysfunction). - Aortic valve: There was moderate regurgitation. - Pulmonary arteries: PA peak pressure: 46 mm Hg (S).  Impressions:  - The right ventricular systolic pressure was increased consistent with moderate pulmonary hypertension. [place] normal LVEF, moderate LVH, with mild MR, and mild to moderate AR and severe aortic stenosis.  ------------------------------------------------------------------- Study data: Study status: Routine. Procedure: The patient reported no pain pre or post test. Transthoracic echocardiography for left ventricular function evaluation, for right ventricular function evaluation, and for assessment of valvular function. Image quality was adequate. Study completion: There were no complications. Transthoracic echocardiography. M-mode, complete 2D, spectral Doppler, and color Doppler. Birthdate: Patient birthdate: Aug 23, 1940. Age:  Patient is 78 yr old. Sex: Gender: male. BMI: 25.1 kg/m^2. Blood pressure: 114/67 Patient status: Outpatient. Study date: Study date: 04/29/2018. Study time: 12:58 PM. Location: Echo laboratory.  -------------------------------------------------------------------  ------------------------------------------------------------------- Left ventricle: The cavity size was mildly dilated. There was moderate concentric hypertrophy. Systolic function was normal. The estimated ejection fraction was 55%. Wall motion was normal; there were no regional wall motion abnormalities. Doppler parameters are consistent with abnormal left ventricular relaxation (grade 1 diastolic dysfunction).  ------------------------------------------------------------------- Aortic valve: Doppler: There was moderate regurgitation. VTI ratio of LVOT to aortic valve: 0.18. Valve area (VTI): 0.55 cm^2. Indexed valve area (VTI): 0.25 cm^2/m^2. Peak velocity ratio of LVOT to aortic valve: 0.21. Valve area (Vmax): 0.67 cm^2. Indexed valve area (Vmax): 0.3 cm^2/m^2. Mean velocity ratio of LVOT to aortic valve: 0.16. Valve area (Vmean): 0.49 cm^2. Indexed valve area (Vmean): 0.22 cm^2/m^2. Mean gradient (S): 37 mm Hg. Peak gradient (S): 62 mm Hg.  ------------------------------------------------------------------- Mitral valve: Doppler: Peak gradient (D): 3 mm Hg.  ------------------------------------------------------------------- Measurements  Left ventricle Value Reference LV ID, ED, PLAX chordal (H) 52.6 mm 43 - 52 LV ID, ES, PLAX chordal (H) 38.8 mm 23 - 38 LV fx shortening, PLAX chordal (L) 26 % >=29 LV PW thickness, ED 11.6 mm ---------- IVS/LV PW ratio, ED 1.21 <=1.3 Stroke volume, 2D 50 ml  ---------- Stroke volume/bsa, 2D 23 ml/m^2 ---------- LV ejection fraction, 1-p A4C 54 % ---------- LV end-diastolic volume, 2-p 038 ml ---------- LV end-systolic volume, 2-p 50 ml ---------- LV ejection fraction, 2-p 56 % ---------- Stroke volume, 2-p 64 ml ---------- LV end-diastolic volume/bsa, 2-p 52 ml/m^2 ---------- LV end-systolic volume/bsa, 2-p 23 ml/m^2 ---------- Stroke volume/bsa, 2-p 28.9 ml/m^2 ---------- LV e&', lateral 7.18 cm/s ---------- LV E/e&', lateral 11.62 ---------- LV e&', medial 3.37 cm/s ---------- LV E/e&', medial 24.75 ---------- LV e&', average 5.28 cm/s ---------- LV E/e&', average 15.81 ----------  Ventricular septum Value Reference IVS thickness, ED 14 mm ----------  LVOT Value Reference LVOT ID, S 20 mm ---------- LVOT area 3.14 cm^2 ---------- LVOT ID 20 mm ---------- LVOT peak velocity, S 84.4 cm/s ---------- LVOT mean velocity, S 43.8 cm/s ---------- LVOT VTI, S 16 cm ---------- Stroke volume (SV), LVOT DP 50.3 ml ---------- Stroke index (SV/bsa), LVOT DP 22.9 ml/m^2 ----------  Aortic valve Value Reference Aortic valve peak velocity, S 394 cm/s  ---------- Aortic valve mean velocity, S 281 cm/s ---------- Aortic valve VTI, S 90.9 cm ---------- Aortic mean gradient, S 37 mm Hg ---------- Aortic peak gradient, S 62 mm Hg ---------- VTI ratio, LVOT/AV 0.18 ---------- Aortic valve area, VTI 0.55 cm^2 ---------- Aortic valve area/bsa, VTI 0.25 cm^2/m^2 ---------- Velocity ratio, peak, LVOT/AV 0.21 ---------- Aortic valve area, peak velocity 0.67 cm^2 ---------- Aortic valve area/bsa, peak 0.3 cm^2/m^2 ---------- velocity Velocity ratio, mean, LVOT/AV 0.16 ---------- Aortic valve area, mean velocity 0.49 cm^2 ---------- Aortic valve area/bsa, mean 0.22 cm^2/m^2 ---------- velocity  Aorta Value Reference Aortic root ID, ED 26 mm ----------  Left atrium Value Reference LA ID, A-P, ES 44 mm ---------- LA ID/bsa, A-P 2 cm/m^2 <=2.2 LA volume, ES, 1-p A4C 35.5 ml ---------- LA volume/bsa, ES, 1-p A4C 16.1 ml/m^2 ----------  Mitral valve Value Reference Mitral E-wave peak velocity 83.4 cm/s ---------- Mitral A-wave peak velocity 45.6 cm/s ---------- Mitral deceleration time (H) 250 ms 150 - 230 Mitral peak gradient, D 3 mm Hg ---------- Mitral E/A ratio, peak 1.6 ----------  Pulmonary arteries Value Reference PA pressure, S, DP (H) 46  mm Hg <=30  Right atrium Value Reference RA ID, S-I, ES, A4C (H) 52.4 mm 34 - 49 RA area, ES, A4C (H) 23.1 cm^2 8.3 - 19.5 RA volume, ES, A/L 82 ml ---------- RA volume/bsa, ES, A/L 37.3 ml/m^2 ----------  Right ventricle Value Reference RV ID, ED, PLAX 31.4 mm 19 - 38 RV ID, minor axis, ED, A4C base 35.9 mm ---------- TAPSE 32.7 mm ---------- RV s&', lateral, S 13.8 cm/s ----------  Legend: (L) and (H) mark values outside specified reference range.  ------------------------------------------------------------------- Prepared and Electronically Authenticated by  Neoma Laming, MD 2020-01-17T11:40:53   Physicians   Panel Physicians Referring Physician Case Authorizing Physician  Minna Merritts, MD (Primary)  Minna Merritts, MD  Procedures   RIGHT HEART CATH AND CORONARY ANGIOGRAPHY  Conclusion    LV end diastolic pressure is normal.  Hemodynamic findings consistent with pulmonary hypertension.   Procedural Details   Technical Details Cardiac Catheterization Procedure Note  Name: TEMITOPE GRIFFING MRN: [id number] DOB: Mar 21, 1941  Procedure: Left Heart Cath, right heart catheterization , selective Coronary Angiography,   Indication:  78 year old gentleman with past medical history of Severe aortic valve stenosis, shortness of breath, chest tightness on exertion Also with history of Anxiety GERD Essential hypertension Overactive bladder Peripheral neuropathy MGUS (monoclonal gammopathy of unknown significance) -Presenting for cardiac catheterization for aortic valve stenosis, preop for possible TAVR   Procedural details: The right groin was  prepped, draped, and anesthetized with 1% lidocaine. Using modified Seldinger technique, a 5 French sheath was introduced into the right femoral artery. Standard Judkins catheters (JL 4, JR 4 and pigtail catheter) were used for coronary angiography and left ventriculography. Catheter exchanges were performed over a guidewire. There were no immediate procedural complications. The patient was transferred to the post catheterization recovery area for further monitoring.  Moderate sedation: I was Face to Face with the patient during this time: (code: [phone])   Procedural Findings:  Coronary angiography:  Coronary dominance:  Right  Left mainstem: Large vessel that bifurcates into the LAD and left circumflex, no significant disease noted  Left anterior descending (LAD): Large vessel that extends to the apical region, diagonal branch 2 of moderate size, no significant disease noted  Left circumflex (LCx): Large vessel with OM branch 2, no significant disease noted  Right coronary artery (RCA): Right dominant vessel with PL and PDA, no significant disease noted  Left ventriculography: Left ventricular systolic function is normal, LVEF is estimated at 55-65%, there is no significant mitral regurgitation , no significant aortic valve stenosis  Right heart catheterization: RA pressure 9 RV pressure 55/4, mean of 9 PA pressure 46/14, mean 29 Tremendous difficulty obtaining wedge, despite multiple attempts with a wire Unable to gather PA venous saturations  Final Conclusions:  No significant coronary artery disease Unable to cross aortic valve for pressures or LV function Grossly normal right heart pressures  Recommendations:  Given the findings above, we will refer to TAVR clinic in [facility] No complications  Ida Rogue 07/28/2018, 4:51 PM  Estimated blood loss <50 mL.   During this procedure medications were administered to achieve and maintain moderate conscious sedation  while the patient's heart rate, blood pressure, and oxygen saturation were continuously monitored and I was present face-to-face 100% of this time.  Medications  (Filter: Administrations occurring from 07/28/18 0931 to 07/28/18 1112)          Medication Rate/Dose/Volume Action  Date Time   fentaNYL (SUBLIMAZE) injection (mcg) 25 mcg Given 07/28/18 0946   Total dose as of 09/06/18 1511 25 mcg Given 1005   50 mcg        midazolam (VERSED) injection (mg) 1 mg Given 07/28/18 0946   Total dose as of 09/06/18 1511 1 mg Given 1005   2 mg        Heparin (Porcine) in NaCl 1000-0.9 UT/500ML-% SOLN (mL) 500 mL Given 07/28/18 0951   Total dose as of 09/06/18 1511 500 mL Given 0951   1,000 mL        iopamidol (ISOVUE-300) 61 % injection (mL) 50 mL Given 07/28/18 1058   Total dose as of 09/06/18 1511        50 mL        Sedation Time   Sedation Time Physician-1: 1 hour 29 seconds  Coronary Findings   Diagnostic  Dominance: Right  No diagnostic findings have been documented.  Intervention   No interventions have been documented.  Right Heart   Right Heart Pressures Hemodynamic findings consistent with pulmonary hypertension. LV EDP is normal.  Coronary Diagrams   Diagnostic  Dominance: Right    Intervention   Implants       Vascular Products  Device Closure Mynxgrip 42f - [id number] - Implanted    Inventory item: DEVICE CLOSURE MYNXGRIP 22F Model/Cat number: [id number]  Manufacturer: [employer] Lot number: [id number]  Device identifier: [id number] Device identifier type: GS1  Area Of Implantation: Femoral Artery    GUDID Information   Request status Successful    Brand name: [facility] Version/Model: [id number]  Company name: [employer]. MRI safety info as of 07/28/18: Labeling does not contain MRI Safety Information  Contains dry or latex rubber: No    GMDN P.T. name: Wound hydrogel dressing, sterile    As of 07/28/2018    Status: Implanted      Syngo Images   Show images for CARDIAC CATHETERIZATION  Images on Long Term Storage   Show images for [place], Self  Link to Procedure Log   Procedure Log    Hemo Data (last day) before discharge   AO Systolic Cath Pressure AO Diastolic Cath Pressure AO Mean Cath Pressure PA Systolic Cath Pressure PA Diastolic Cath Pressure PA Mean Cath Pressure RA Wedge A Wave RA Wedge V Wave RV Systolic Cath Pressure RV Diastolic Cath Pressure RV End Diastolic  -- -- -- -- -- -- 9 mmHg 12 mmHg -- -- --  116 54 mmHg 76 mmHg -- -- -- -- -- -- -- --  -- -- -- -- -- -- -- -- 55 mmHg 4 mmHg 9 mmHg  -- -- -- 46 mmHg 14 mmHg 29 mmHg         ADDENDUM REPORT: 08/25/2018 15:27  EXAM: OVER-READ INTERPRETATION CT CHEST  The following report is an over-read performed by radiologist Dr. Samara Snide [facility] on 08/25/2018. This over-read does not include interpretation of cardiac or coronary anatomy or pathology. The cardiac CTA interpretation by the cardiologist is attached.  COMPARISON: 10/18/2008 chest radiograph.  FINDINGS: Please see the separate concurrent chest CT angiogram report for details.  IMPRESSION: Please see the separate concurrent chest CT angiogram report for details.   Electronically Signed By: Ilona Sorrel M.D. On: 08/25/2018 15:27   Addended by Sharyn Blitz, MD on 08/25/2018 3:29 PM    Study Result   CLINICAL DATA: Aortic Stenosis  EXAM: Cardiac TAVR CT  TECHNIQUE: The patient was scanned on a Siemens Force 825 slice scanner. A 120 kV retrospective scan was triggered in the ascending thoracic aorta at 140 HU's. Gantry rotation speed was 250 msecs and collimation was .6 mm. No beta blockade or nitro were given. The 3D data set was reconstructed in 5% intervals of the R-R cycle. Systolic and diastolic phases were analyzed on a dedicated work station using MPR, MIP and VRT  modes. The patient received 80 cc of contrast.  FINDINGS: Aortic Valve: Tri leaflet calcified with restricted leaflet motion Central area of mal coaptation explaining central AR  Aorta: Normal arch vessels no aneurysm mild calcific aortic atherosclerosis  Sino-tubular Junction: 24 mm  Ascending Thoracic Aorta: 34 mm  Aortic Arch: 25 mm  Descending Thoracic Aorta: 23 mm  Sinus of Valsalva Measurements:  Non-coronary: 26.7 mm  Right - coronary: 26.6 mm  Left - coronary: 29.4 mm  Coronary Artery Height above Annulus:  Left Main: 12.6 mm above annulus  Right Coronary: 14 mm above annulus  Virtual Basal Annulus Measurements:  Maximum / Minimum Diameter: 26.04 mm x 21.3 mm  Perimeter: 77.5 mm  Area: 465 mm2  Coronary Arteries: Sufficient height above annulus for deployment  Optimum Fluoroscopic Angle for Delivery: LAO 6 Caudal 6 degrees  IMPRESSION: 1. Tri leaflet AV with annular area of 465 mm2 suitable for a 26 mm Sapien 3 valve  2. Bulky nodular calcification at the base of the left coronary cusp at STJ  3. Normal aortic root 3.4 cm  4. Coronary arteries sufficient height above annulus for deployment  5. Optimal angiographic angle for deployment LAO 6 Caudal 6 degrees  Jenkins Rouge  Electronically Signed: By: Jenkins Rouge M.D. On: 08/25/2018 12:27       CLINICAL DATA: 78 year old male with severe symptomatic aortic stenosis. Pre-TAVR evaluation.  EXAM: CT ANGIOGRAPHY CHEST, ABDOMEN AND PELVIS  TECHNIQUE: Multidetector CT imaging through the chest, abdomen and pelvis was performed using the standard protocol during bolus administration of intravenous contrast. Multiplanar reconstructed images and MIPs were obtained and reviewed to evaluate  the vascular anatomy.  CONTRAST: 135mL OMNIPAQUE IOHEXOL 350 MG/ML SOLN  COMPARISON: 10/18/2008 chest radiograph.  FINDINGS: CTA CHEST  FINDINGS  Cardiovascular: Mild cardiomegaly. No significant pericardial effusion/thickening. Marked thickening and coarse calcification of the aortic valve. Mildly atherosclerotic nonaneurysmal thoracic aorta. Normal caliber pulmonary arteries. No central pulmonary emboli.  Mediastinum/Nodes: No discrete thyroid nodules. Unremarkable esophagus. No pathologically enlarged axillary, mediastinal or hilar lymph nodes.  Lungs/Pleura: No pneumothorax. No pleural effusion. No acute consolidative airspace disease or lung masses. Two tiny scattered solid pulmonary nodules, largest 3 mm in the subpleural apical left upper lobe (series 15/image 12).  Musculoskeletal: No aggressive appearing focal osseous lesions. Marked thoracic spondylosis.  CTA ABDOMEN AND PELVIS FINDINGS  Hepatobiliary: Normal liver with no liver mass. Normal gallbladder with no radiopaque cholelithiasis. No biliary ductal dilatation.  Pancreas: Normal, with no mass or duct dilation.  Spleen: Normal size. No mass.  Adrenals/Urinary Tract: Normal adrenals. No hydronephrosis. No contour deforming renal masses. Normal bladder.  Stomach/Bowel: Normal non-distended stomach. Normal caliber small bowel with no small bowel wall thickening. Normal appendix. Normal large bowel with no diverticulosis, large bowel wall thickening or pericolonic fat stranding.  Vascular/Lymphatic: Atherosclerotic nonaneurysmal abdominal aorta. Patent splenic and renal veins. No pathologically enlarged lymph nodes in the abdomen or pelvis.  Reproductive: Moderate prostatomegaly.  Other: No pneumoperitoneum, ascites or focal fluid collection.  Musculoskeletal: No aggressive appearing focal osseous lesions. Marked lumbar spondylosis.  VASCULAR MEASUREMENTS PERTINENT TO TAVR:  AORTA:  Minimal Aortic Diameter-17.4 x 15.9 mm  Severity of Aortic Calcification-mild  RIGHT PELVIS:  Right Common Iliac Artery  -  Minimal Diameter-9.9 x 9.1 mm  Tortuosity-mild  Calcification-mild  Right External Iliac Artery -  Minimal Diameter-8.9 x 8.7 mm  Tortuosity-moderate  Calcification-none  Right Common Femoral Artery -  Minimal Diameter-9.0 x 8.3 mm  Tortuosity-mild  Calcification-minimal  LEFT PELVIS:  Left Common Iliac Artery -  Minimal Diameter-9.8 x 8.9 mm  Tortuosity-mild  Calcification-mild  Left External Iliac Artery -  Minimal Diameter-8.8 x 8.1 mm  Tortuosity-moderate  Calcification-none  Left Common Femoral Artery -  Minimal Diameter-7.7 x 6.9 mm  Tortuosity-mild  Calcification-mild  Review of the MIP images confirms the above findings.  IMPRESSION: 1. Vascular findings and measurements pertinent to potential TAVR procedure, as detailed. 2. Marked thickening and calcification of the aortic valve, compatible with the reported history of severe symptomatic aortic stenosis. 3. Mild cardiomegaly. 4. Two tiny solid pulmonary nodules, largest 3 mm. No follow-up needed if patient is low-risk (and has no known or suspected primary neoplasm). Non-contrast chest CT can be considered in 12 months if patient is high-risk. This recommendation follows the consensus statement: Guidelines for Management of Incidental Pulmonary Nodules Detected on CT Images:From the Fleischner Society 2017; published online before print (10.1148/[id number]). 5. Moderately enlarged prostate. 6. Aortic Atherosclerosis (ICD10-I70.0).   Electronically Signed By: Ilona Sorrel M.D. On: 08/25/2018 16:01   STS RISKCALCULATOR: Risk of Mortality:  1.025% Renal Failure:  1.215% Permanent Stroke:  1.269% Prolonged Ventilation:  3.580% DSW Infection:  0.058% Reoperation:  4.228% Morbidity or Mortality:  7.060% Short Length of Stay:  50.320% Long Length of Stay:  2.858%   Impression:  This 78 year old gentleman has stage D,  severe, symptomatic aortic stenosis with New York Heart Association class II symptoms of exertional fatigue associated with atypical chest discomfort and some ankle and pedal edema.  He is not very active at the present time due to the coronavirus restrictions as well as his peripheral neuropathy and may  have exertional shortness of breath if that was not the case.  I have personally reviewed his 2D echocardiogram, cardiac catheterization, and CTA studies.  His echocardiogram shows a calcified aortic valve with a mean gradient of 37 mmHg and a dimensionless index of 0.21 consistent with severe aortic stenosis.  There is also moderate aortic insufficiency.  Left ventricular systolic function is normal.  Cardiac catheterization showed no significant coronary disease.  There is mild pulmonary hypertension.  I agree that aortic valve replacement is indicated in this patient for relief of his symptoms and improvement of his quality of life as well as to prevent progressive left ventricular deterioration.  I think transcatheter aortic valve replacement will be the best option given his age.  His gated cardiac CTA shows anatomy that is suitable for transcatheter aortic valve replacement using a Sapien 3 valve.  His annular area appears suitable for a 26 mm valve.  The right and left coronary height appear adequate.  His noncoronary and right coronary sinuses are only 27 mm and the sinotubular junction diameter is 24 mm but there is no calcium at the ST J.  There is some nodular calcification at the base of the left coronary cusp.  His abdominal and pelvic CTA shows adequate pelvic vascular anatomy to allow transfemoral insertion.  The patient was counseled at length regarding treatment alternatives for management of severe symptomatic aortic stenosis. The risks and benefits of surgical intervention has been discussed in detail. Long-term prognosis with medical therapy was discussed. Alternative approaches such as  conventional surgical aortic valve replacement, transcatheter aortic valve replacement, and palliative medical therapy were compared and contrasted at length. This discussion was placed in the context of the patient's own specific clinical presentation and past medical history. All of his questions have been addressed.   Following the decision to proceed with transcatheter aortic valve replacement, a discussion was held regarding what types of management strategies would be attempted intraoperatively in the event of life-threatening complications, including whether or not the patient would be considered a candidate for the use of cardiopulmonary bypass and/or conversion to open sternotomy for attempted surgical intervention. The patient is aware of the fact that transient use of cardiopulmonary bypass may be necessary.  His surgical risk is very low and I think he would certainly be a candidate for sternotomy to manage any intraoperative complications.  The patient has been advised of a variety of complications that might develop including but not limited to risks of death, stroke, paravalvular leak, aortic dissection or other major vascular complications, aortic annulus rupture, device embolization, cardiac rupture or perforation, mitral regurgitation, acute myocardial infarction, arrhythmia, heart block or bradycardia requiring permanent pacemaker placement, congestive heart failure, respiratory failure, renal failure, pneumonia, infection, other late complications related to structural valve deterioration or migration, or other complications that might ultimately cause a temporary or permanent loss of functional independence or other long term morbidity. The patient provides full informed consent for the procedure as described and all questions were answered.     Plan:  Transfemoral transcatheter aortic valve replacement.    Gaye Pollack, MD

## 2018-09-13 ENCOUNTER — Inpatient Hospital Stay (HOSPITAL_COMMUNITY): Payer: Medicare Other | Admitting: Certified Registered Nurse Anesthetist

## 2018-09-13 ENCOUNTER — Other Ambulatory Visit: Payer: Self-pay

## 2018-09-13 ENCOUNTER — Inpatient Hospital Stay (HOSPITAL_COMMUNITY): Payer: Medicare Other

## 2018-09-13 ENCOUNTER — Inpatient Hospital Stay (HOSPITAL_COMMUNITY)
Admission: RE | Admit: 2018-09-13 | Discharge: 2018-09-14 | DRG: 266 | Disposition: A | Discharge status: Home / Self Care | Payer: Medicare Other | Attending: Surgery | Admitting: Surgery

## 2018-09-13 ENCOUNTER — Encounter (HOSPITAL_COMMUNITY): Payer: Self-pay | Admitting: *Deleted

## 2018-09-13 ENCOUNTER — Encounter (HOSPITAL_COMMUNITY)
Admission: RE | Disposition: A | Discharge status: Home / Self Care | Payer: Self-pay | Source: Home / Self Care | Attending: Surgery

## 2018-09-13 ENCOUNTER — Other Ambulatory Visit (HOSPITAL_COMMUNITY): Payer: Self-pay | Admitting: *Deleted

## 2018-09-13 DIAGNOSIS — I5033 Acute on chronic diastolic (congestive) heart failure: Secondary | ICD-10-CM | POA: Diagnosis not present

## 2018-09-13 DIAGNOSIS — F419 Anxiety disorder, unspecified: Secondary | ICD-10-CM | POA: Diagnosis not present

## 2018-09-13 DIAGNOSIS — I272 Pulmonary hypertension, unspecified: Secondary | ICD-10-CM | POA: Diagnosis present

## 2018-09-13 DIAGNOSIS — Z801 Family history of malignant neoplasm of trachea, bronchus and lung: Secondary | ICD-10-CM | POA: Diagnosis not present

## 2018-09-13 DIAGNOSIS — N400 Enlarged prostate without lower urinary tract symptoms: Secondary | ICD-10-CM | POA: Diagnosis present

## 2018-09-13 DIAGNOSIS — Z48812 Encounter for surgical aftercare following surgery on the circulatory system: Secondary | ICD-10-CM | POA: Diagnosis not present

## 2018-09-13 DIAGNOSIS — Z91048 Other nonmedicinal substance allergy status: Secondary | ICD-10-CM

## 2018-09-13 DIAGNOSIS — M1710 Unilateral primary osteoarthritis, unspecified knee: Secondary | ICD-10-CM | POA: Diagnosis not present

## 2018-09-13 DIAGNOSIS — I352 Nonrheumatic aortic (valve) stenosis with insufficiency: Principal | ICD-10-CM | POA: Diagnosis present

## 2018-09-13 DIAGNOSIS — I779 Disorder of arteries and arterioles, unspecified: Secondary | ICD-10-CM | POA: Diagnosis present

## 2018-09-13 DIAGNOSIS — I454 Nonspecific intraventricular block: Secondary | ICD-10-CM | POA: Diagnosis present

## 2018-09-13 DIAGNOSIS — Z952 Presence of prosthetic heart valve: Secondary | ICD-10-CM

## 2018-09-13 DIAGNOSIS — D472 Monoclonal gammopathy: Secondary | ICD-10-CM | POA: Diagnosis present

## 2018-09-13 DIAGNOSIS — Z954 Presence of other heart-valve replacement: Secondary | ICD-10-CM | POA: Diagnosis not present

## 2018-09-13 DIAGNOSIS — I110 Hypertensive heart disease with heart failure: Secondary | ICD-10-CM | POA: Diagnosis present

## 2018-09-13 DIAGNOSIS — I10 Essential (primary) hypertension: Secondary | ICD-10-CM | POA: Diagnosis present

## 2018-09-13 DIAGNOSIS — I700 Atherosclerosis of aorta: Secondary | ICD-10-CM | POA: Diagnosis present

## 2018-09-13 DIAGNOSIS — Z8349 Family history of other endocrine, nutritional and metabolic diseases: Secondary | ICD-10-CM | POA: Diagnosis not present

## 2018-09-13 DIAGNOSIS — I350 Nonrheumatic aortic (valve) stenosis: Secondary | ICD-10-CM | POA: Diagnosis not present

## 2018-09-13 DIAGNOSIS — Z820 Family history of epilepsy and other diseases of the nervous system: Secondary | ICD-10-CM

## 2018-09-13 DIAGNOSIS — N3281 Overactive bladder: Secondary | ICD-10-CM | POA: Diagnosis present

## 2018-09-13 DIAGNOSIS — Z800 Family history of malignant neoplasm of digestive organs: Secondary | ICD-10-CM

## 2018-09-13 DIAGNOSIS — K219 Gastro-esophageal reflux disease without esophagitis: Secondary | ICD-10-CM | POA: Diagnosis present

## 2018-09-13 DIAGNOSIS — Z7982 Long term (current) use of aspirin: Secondary | ICD-10-CM

## 2018-09-13 DIAGNOSIS — Z006 Encounter for examination for normal comparison and control in clinical research program: Secondary | ICD-10-CM | POA: Diagnosis not present

## 2018-09-13 DIAGNOSIS — G629 Polyneuropathy, unspecified: Secondary | ICD-10-CM | POA: Diagnosis not present

## 2018-09-13 DIAGNOSIS — I2510 Atherosclerotic heart disease of native coronary artery without angina pectoris: Secondary | ICD-10-CM | POA: Diagnosis not present

## 2018-09-13 HISTORY — DX: Unspecified hearing loss, unspecified ear: H91.90

## 2018-09-13 HISTORY — DX: Presence of prosthetic heart valve: Z95.2

## 2018-09-13 HISTORY — PX: TRANSCATHETER AORTIC VALVE REPLACEMENT, TRANSFEMORAL: SHX6400

## 2018-09-13 HISTORY — PX: TEE WITHOUT CARDIOVERSION: SHX5443

## 2018-09-13 LAB — POCT I-STAT 7, (LYTES, BLD GAS, ICA,H+H)
Acid-base deficit: 1 mmol/L (ref 0.0–2.0)
Bicarbonate: 22.3 mmol/L (ref 20.0–28.0)
Calcium, Ion: 1.2 mmol/L (ref 1.15–1.40)
HCT: 36 % — ABNORMAL LOW (ref 39.0–52.0)
Hemoglobin: 12.2 g/dL — ABNORMAL LOW (ref 13.0–17.0)
O2 Saturation: 99 %
Potassium: 4.1 mmol/L (ref 3.5–5.1)
Sodium: 140 mmol/L (ref 135–145)
TCO2: 23 mmol/L (ref 22–32)
pCO2 arterial: 33.3 mmHg (ref 32.0–48.0)
pH, Arterial: 7.434 (ref 7.350–7.450)
pO2, Arterial: 135 mmHg — ABNORMAL HIGH (ref 83.0–108.0)

## 2018-09-13 LAB — POCT I-STAT 4, (NA,K, GLUC, HGB,HCT)
Glucose, Bld: 117 mg/dL — ABNORMAL HIGH (ref 70–99)
Glucose, Bld: 136 mg/dL — ABNORMAL HIGH (ref 70–99)
HCT: 38 % — ABNORMAL LOW (ref 39.0–52.0)
HCT: 36 % — ABNORMAL LOW (ref 39.0–52.0)
Hemoglobin: 12.9 g/dL — ABNORMAL LOW (ref 13.0–17.0)
Hemoglobin: 12.2 g/dL — ABNORMAL LOW (ref 13.0–17.0)
Potassium: 3.9 mmol/L (ref 3.5–5.1)
Potassium: 4.2 mmol/L (ref 3.5–5.1)
Sodium: 140 mmol/L (ref 135–145)
Sodium: 140 mmol/L (ref 135–145)

## 2018-09-13 LAB — POCT I-STAT CREATININE: Creatinine, Ser: 0.7 mg/dL (ref 0.61–1.24)

## 2018-09-13 SURGERY — IMPLANTATION, AORTIC VALVE, TRANSCATHETER, FEMORAL APPROACH
Anesthesia: General | Site: Chest

## 2018-09-13 MED ORDER — SODIUM CHLORIDE 0.9 % IV SOLN: INTRAVENOUS | Status: DC

## 2018-09-13 MED ORDER — ACETAMINOPHEN 650 MG RE SUPP: 650.0000 mg | Freq: Four times a day (QID) | RECTAL | Status: DC | PRN

## 2018-09-13 MED ORDER — PROPOFOL 10 MG/ML IV BOLUS
INTRAVENOUS | Status: AC
  Filled 2018-09-13: qty 20

## 2018-09-13 MED ORDER — LABETALOL HCL 5 MG/ML IV SOLN
INTRAVENOUS | Status: DC | PRN
  Administered 2018-09-13: 5 mg via INTRAVENOUS

## 2018-09-13 MED ORDER — EPHEDRINE SULFATE-NACL 50-0.9 MG/10ML-% IV SOSY
PREFILLED_SYRINGE | INTRAVENOUS | Status: DC | PRN
  Administered 2018-09-13: 15 mg via INTRAVENOUS

## 2018-09-13 MED ORDER — PHENYLEPHRINE HCL-NACL 20-0.9 MG/250ML-% IV SOLN: 0.0000 ug/min | INTRAVENOUS | Status: DC

## 2018-09-13 MED ORDER — VANCOMYCIN HCL IN DEXTROSE 1-5 GM/200ML-% IV SOLN
1000.0000 mg | Freq: Once | INTRAVENOUS | Status: AC
  Administered 2018-09-13: 1000 mg via INTRAVENOUS
  Filled 2018-09-13: qty 200

## 2018-09-13 MED ORDER — PROTAMINE SULFATE 10 MG/ML IV SOLN: INTRAVENOUS | Status: DC | PRN

## 2018-09-13 MED ORDER — PROPOFOL 10 MG/ML IV BOLUS
INTRAVENOUS | Status: DC | PRN
  Administered 2018-09-13: 10 mg via INTRAVENOUS

## 2018-09-13 MED ORDER — PROPOFOL 500 MG/50ML IV EMUL
INTRAVENOUS | Status: DC | PRN
  Administered 2018-09-13: 5 ug/kg/min via INTRAVENOUS

## 2018-09-13 MED ORDER — NITROGLYCERIN IN D5W 200-5 MCG/ML-% IV SOLN: 0.0000 ug/min | INTRAVENOUS | Status: DC

## 2018-09-13 MED ORDER — CHLORHEXIDINE GLUCONATE 0.12 % MT SOLN
15.0000 mL | Freq: Once | OROMUCOSAL | Status: AC
  Administered 2018-09-13: 15 mL via OROMUCOSAL
  Filled 2018-09-13: qty 15

## 2018-09-13 MED ORDER — MIDAZOLAM HCL 2 MG/2ML IJ SOLN
INTRAMUSCULAR | Status: DC | PRN
  Administered 2018-09-13: 2 mg via INTRAVENOUS

## 2018-09-13 MED ORDER — CLOPIDOGREL BISULFATE 75 MG PO TABS
75.0000 mg | ORAL_TABLET | Freq: Every day | ORAL | Status: DC
  Administered 2018-09-14: 75 mg via ORAL
  Filled 2018-09-13: qty 1

## 2018-09-13 MED ORDER — ONDANSETRON HCL 4 MG/2ML IJ SOLN: 4.0000 mg | Freq: Four times a day (QID) | INTRAMUSCULAR | Status: DC | PRN

## 2018-09-13 MED ORDER — 0.9 % SODIUM CHLORIDE (POUR BTL) OPTIME
TOPICAL | Status: DC | PRN
  Administered 2018-09-13: 1000 mL

## 2018-09-13 MED ORDER — PROTAMINE SULFATE 10 MG/ML IV SOLN
INTRAVENOUS | Status: DC | PRN
  Administered 2018-09-13: 140 mg via INTRAVENOUS

## 2018-09-13 MED ORDER — HEPARIN SODIUM (PORCINE) 1000 UNIT/ML IJ SOLN
INTRAMUSCULAR | Status: DC | PRN
  Administered 2018-09-13: 14000 [IU] via INTRAVENOUS

## 2018-09-13 MED ORDER — OXYCODONE HCL 5 MG PO TABS: 5.0000 mg | ORAL_TABLET | ORAL | Status: DC | PRN

## 2018-09-13 MED ORDER — LIDOCAINE HCL (PF) 1 % IJ SOLN
INTRAMUSCULAR | Status: AC
  Filled 2018-09-13: qty 30

## 2018-09-13 MED ORDER — ESMOLOL HCL 100 MG/10ML IV SOLN
INTRAVENOUS | Status: AC
  Filled 2018-09-13: qty 10

## 2018-09-13 MED ORDER — LIDOCAINE HCL 1 % IJ SOLN
INTRAMUSCULAR | Status: DC | PRN
  Administered 2018-09-13: 8 mL

## 2018-09-13 MED ORDER — MIRABEGRON ER 50 MG PO TB24
50.0000 mg | ORAL_TABLET | Freq: Every day | ORAL | Status: DC
  Administered 2018-09-13 – 2018-09-14 (×2): 50 mg via ORAL
  Filled 2018-09-13 (×2): qty 1

## 2018-09-13 MED ORDER — FENTANYL CITRATE (PF) 250 MCG/5ML IJ SOLN
INTRAMUSCULAR | Status: DC | PRN
  Administered 2018-09-13 (×2): 25 ug via INTRAVENOUS

## 2018-09-13 MED ORDER — SODIUM CHLORIDE 0.9 % IV SOLN
INTRAVENOUS | Status: AC
  Filled 2018-09-13 (×3): qty 1.2

## 2018-09-13 MED ORDER — SODIUM CHLORIDE 0.9% FLUSH: 3.0000 mL | INTRAVENOUS | Status: DC | PRN

## 2018-09-13 MED ORDER — DEXMEDETOMIDINE HCL IN NACL 400 MCG/100ML IV SOLN
INTRAVENOUS | Status: DC | PRN
  Administered 2018-09-13: 90.4 ug via INTRAVENOUS

## 2018-09-13 MED ORDER — SODIUM CHLORIDE 0.9 % IV SOLN: 250.0000 mL | INTRAVENOUS | Status: DC | PRN

## 2018-09-13 MED ORDER — TRAMADOL HCL 50 MG PO TABS: 50.0000 mg | ORAL_TABLET | ORAL | Status: DC | PRN

## 2018-09-13 MED ORDER — SODIUM CHLORIDE 0.9 % IV SOLN
INTRAVENOUS | Status: DC | PRN
  Administered 2018-09-13: 1500 mL

## 2018-09-13 MED ORDER — IODIXANOL 320 MG/ML IV SOLN
INTRAVENOUS | Status: DC | PRN
  Administered 2018-09-13: 40.1 mL via INTRAVENOUS

## 2018-09-13 MED ORDER — SODIUM CHLORIDE 0.9 % IV SOLN
1.5000 g | Freq: Two times a day (BID) | INTRAVENOUS | Status: DC
  Administered 2018-09-13 – 2018-09-14 (×2): 1.5 g via INTRAVENOUS
  Filled 2018-09-13 (×3): qty 1.5

## 2018-09-13 MED ORDER — ONDANSETRON HCL 4 MG/2ML IJ SOLN
INTRAMUSCULAR | Status: DC | PRN
  Administered 2018-09-13: 4 mg via INTRAVENOUS

## 2018-09-13 MED ORDER — SODIUM CHLORIDE 0.9 % IV SOLN
INTRAVENOUS | Status: DC
  Administered 2018-09-13: 11:00:00 via INTRAVENOUS

## 2018-09-13 MED ORDER — CHLORHEXIDINE GLUCONATE 4 % EX LIQD: 30.0000 mL | CUTANEOUS | Status: DC

## 2018-09-13 MED ORDER — FENTANYL CITRATE (PF) 250 MCG/5ML IJ SOLN
INTRAMUSCULAR | Status: AC
  Filled 2018-09-13: qty 5

## 2018-09-13 MED ORDER — LABETALOL HCL 5 MG/ML IV SOLN
INTRAVENOUS | Status: AC
  Filled 2018-09-13: qty 4

## 2018-09-13 MED ORDER — LACTATED RINGERS IV SOLN
INTRAVENOUS | Status: DC | PRN
  Administered 2018-09-13: 07:00:00 via INTRAVENOUS

## 2018-09-13 MED ORDER — MORPHINE SULFATE (PF) 2 MG/ML IV SOLN: 1.0000 mg | INTRAVENOUS | Status: DC | PRN

## 2018-09-13 MED ORDER — CHLORHEXIDINE GLUCONATE 4 % EX LIQD: 60.0000 mL | Freq: Once | CUTANEOUS | Status: DC

## 2018-09-13 MED ORDER — ACETAMINOPHEN 325 MG PO TABS
650.0000 mg | ORAL_TABLET | Freq: Four times a day (QID) | ORAL | Status: DC | PRN
  Administered 2018-09-14: 650 mg via ORAL
  Filled 2018-09-13: qty 2

## 2018-09-13 MED ORDER — ASPIRIN EC 81 MG PO TBEC
81.0000 mg | DELAYED_RELEASE_TABLET | Freq: Every evening | ORAL | Status: DC
  Administered 2018-09-13: 81 mg via ORAL
  Filled 2018-09-13: qty 1

## 2018-09-13 MED ORDER — SODIUM CHLORIDE 0.9% FLUSH: 3.0000 mL | Freq: Two times a day (BID) | INTRAVENOUS | Status: DC

## 2018-09-13 MED ORDER — ALPRAZOLAM 0.5 MG PO TABS: 0.5000 mg | ORAL_TABLET | Freq: Two times a day (BID) | ORAL | Status: DC | PRN

## 2018-09-13 MED ORDER — MIDAZOLAM HCL 2 MG/2ML IJ SOLN
INTRAMUSCULAR | Status: AC
  Filled 2018-09-13: qty 2

## 2018-09-13 SURGICAL SUPPLY — 94 items
BAG DECANTER FOR FLEXI CONT (MISCELLANEOUS) ×3 IMPLANT
BAG SNAP BAND KOVER 36X36 (MISCELLANEOUS) ×6 IMPLANT
BLADE CLIPPER SURG (BLADE) ×3 IMPLANT
BLADE STERNUM SYSTEM 6 (BLADE) IMPLANT
BLADE SURG 10 STRL SS (BLADE) IMPLANT
CABLE ADAPT CONN TEMP 6FT (ADAPTER) ×3 IMPLANT
CANISTER SUCT 3000ML PPV (MISCELLANEOUS) ×3 IMPLANT
CANNULA FEM VENOUS REMOTE 22FR (CANNULA) IMPLANT
CANNULA OPTISITE PERFUSION 16F (CANNULA) IMPLANT
CANNULA OPTISITE PERFUSION 18F (CANNULA) IMPLANT
CATH DIAG EXPO 6F VENT PIG 145 (CATHETERS) ×6 IMPLANT
CATH EXPO 5FR AL1 (CATHETERS) IMPLANT
CATH EXTERNAL FEMALE PUREWICK (CATHETERS) IMPLANT
CATH INFINITI 6F AL2 (CATHETERS) ×3 IMPLANT
CATH S G BIP PACING (CATHETERS) ×3 IMPLANT
CHLORAPREP W/TINT 26ML (MISCELLANEOUS) ×3 IMPLANT
CLIP VESOCCLUDE MED 24/CT (CLIP) IMPLANT
CLIP VESOCCLUDE SM WIDE 24/CT (CLIP) IMPLANT
CLOSURE MYNX CONTROL 6F/7F (Vascular Products) ×3 IMPLANT
CONT SPEC 4OZ CLIKSEAL STRL BL (MISCELLANEOUS) ×6 IMPLANT
COVER BACK TABLE 80X110 HD (DRAPES) ×6 IMPLANT
COVER DOME SNAP 22 D (MISCELLANEOUS) ×3 IMPLANT
COVER WAND RF STERILE (DRAPES) IMPLANT
CRADLE DONUT ADULT HEAD (MISCELLANEOUS) ×3 IMPLANT
DECANTER SPIKE VIAL GLASS SM (MISCELLANEOUS) ×3 IMPLANT
DERMABOND ADVANCED (GAUZE/BANDAGES/DRESSINGS) ×1
DERMABOND ADVANCED .7 DNX12 (GAUZE/BANDAGES/DRESSINGS) ×2 IMPLANT
DEVICE CLOSURE PERCLS PRGLD 6F (VASCULAR PRODUCTS) ×4 IMPLANT
DRAPE INCISE IOBAN 66X45 STRL (DRAPES) IMPLANT
DRSG TEGADERM 4X4.75 (GAUZE/BANDAGES/DRESSINGS) ×3 IMPLANT
ELECT CAUTERY BLADE 6.4 (BLADE) IMPLANT
ELECT REM PT RETURN 9FT ADLT (ELECTROSURGICAL) ×3
ELECTRODE REM PT RTRN 9FT ADLT (ELECTROSURGICAL) ×2 IMPLANT
FELT TEFLON 6X6 (MISCELLANEOUS) IMPLANT
FEMORAL VENOUS CANN RAP (CANNULA) IMPLANT
GAUZE SPONGE 4X4 12PLY STRL (GAUZE/BANDAGES/DRESSINGS) ×3 IMPLANT
GAUZE SPONGE 4X4 12PLY STRL LF (GAUZE/BANDAGES/DRESSINGS) ×3 IMPLANT
GLOVE BIO SURGEON STRL SZ7.5 (GLOVE) IMPLANT
GLOVE BIO SURGEON STRL SZ8 (GLOVE) ×3 IMPLANT
GLOVE EUDERMIC 7 POWDERFREE (GLOVE) ×3 IMPLANT
GLOVE ORTHO TXT STRL SZ7.5 (GLOVE) IMPLANT
GOWN STRL REUS W/ TWL LRG LVL3 (GOWN DISPOSABLE) ×4 IMPLANT
GOWN STRL REUS W/ TWL XL LVL3 (GOWN DISPOSABLE) ×6 IMPLANT
GOWN STRL REUS W/TWL LRG LVL3 (GOWN DISPOSABLE) ×2
GOWN STRL REUS W/TWL XL LVL3 (GOWN DISPOSABLE) ×3
GUIDEWIRE SAF TJ AMPL .035X180 (WIRE) ×3 IMPLANT
GUIDEWIRE SAFE TJ AMPLATZ EXST (WIRE) ×3 IMPLANT
GUIDEWIRE STRAIGHT .035 260CM (WIRE) ×3 IMPLANT
INSERT FOGARTY SM (MISCELLANEOUS) IMPLANT
KIT BASIN OR (CUSTOM PROCEDURE TRAY) ×3 IMPLANT
KIT DILATOR VASC 18G NDL (KITS) IMPLANT
KIT HEART LEFT (KITS) ×3 IMPLANT
KIT SUCTION CATH 14FR (SUCTIONS) IMPLANT
KIT TURNOVER KIT B (KITS) ×3 IMPLANT
LOOP VESSEL MAXI BLUE (MISCELLANEOUS) IMPLANT
LOOP VESSEL MINI RED (MISCELLANEOUS) IMPLANT
NEEDLE 22X1 1/2 (OR ONLY) (NEEDLE) ×3 IMPLANT
NS IRRIG 1000ML POUR BTL (IV SOLUTION) ×3 IMPLANT
PACK ENDO MINOR (CUSTOM PROCEDURE TRAY) ×3 IMPLANT
PAD ARMBOARD 7.5X6 YLW CONV (MISCELLANEOUS) ×6 IMPLANT
PAD ELECT DEFIB RADIOL ZOLL (MISCELLANEOUS) ×3 IMPLANT
PENCIL BUTTON HOLSTER BLD 10FT (ELECTRODE) IMPLANT
PERCLOSE PROGLIDE 6F (VASCULAR PRODUCTS) ×6
SET MICROPUNCTURE 5F STIFF (MISCELLANEOUS) ×3 IMPLANT
SHEATH BRITE TIP 6FR 35CM (SHEATH) ×3 IMPLANT
SHEATH PINNACLE 6F 10CM (SHEATH) ×3 IMPLANT
SHEATH PINNACLE 8F 10CM (SHEATH) ×3 IMPLANT
SLEEVE REPOSITIONING LENGTH 30 (MISCELLANEOUS) ×3 IMPLANT
SPONGE GAUZE 2X2 8PLY STRL LF (GAUZE/BANDAGES/DRESSINGS) ×3 IMPLANT
SPONGE LAP 4X18 RFD (DISPOSABLE) ×3 IMPLANT
STOPCOCK MORSE 400PSI 3WAY (MISCELLANEOUS) ×6 IMPLANT
SUT ETHIBOND X763 2 0 SH 1 (SUTURE) IMPLANT
SUT GORETEX CV 4 TH 22 36 (SUTURE) IMPLANT
SUT GORETEX CV4 TH-18 (SUTURE) IMPLANT
SUT MNCRL AB 3-0 PS2 18 (SUTURE) IMPLANT
SUT PROLENE 5 0 C 1 36 (SUTURE) IMPLANT
SUT PROLENE 6 0 C 1 30 (SUTURE) IMPLANT
SUT SILK  1 MH (SUTURE) ×1
SUT SILK 1 MH (SUTURE) ×2 IMPLANT
SUT VIC AB 2-0 CT1 27 (SUTURE)
SUT VIC AB 2-0 CT1 TAPERPNT 27 (SUTURE) IMPLANT
SUT VIC AB 2-0 CTX 36 (SUTURE) IMPLANT
SUT VIC AB 3-0 SH 8-18 (SUTURE) IMPLANT
SYR 50ML LL SCALE MARK (SYRINGE) ×3 IMPLANT
SYR BULB IRRIGATION 50ML (SYRINGE) IMPLANT
SYR CONTROL 10ML LL (SYRINGE) IMPLANT
SYR MEDRAD MARK V 150ML (SYRINGE) ×3 IMPLANT
TOWEL GREEN STERILE (TOWEL DISPOSABLE) ×6 IMPLANT
TRANSDUCER W/STOPCOCK (MISCELLANEOUS) ×6 IMPLANT
TRAY FOLEY SLVR 14FR TEMP STAT (SET/KITS/TRAYS/PACK) IMPLANT
TUBE SUCT INTRACARD DLP 20F (MISCELLANEOUS) IMPLANT
URINAL MALE W/LID DISP 1000CC (MISCELLANEOUS) IMPLANT
VALVE HEART TRANSCATH SZ3 26MM (Valve) ×3 IMPLANT
WIRE .035 3MM-J 145CM (WIRE) ×3 IMPLANT

## 2018-09-13 NOTE — Anesthesia Procedure Notes (Signed)
Procedure Name: MAC Date/Time: 09/13/2018 7:30 AM Performed by: Elayne Snare, CRNA Pre-anesthesia Checklist: Patient identified, Emergency Drugs available, Suction available and Patient being monitored Patient Re-evaluated:Patient Re-evaluated prior to induction Oxygen Delivery Method: Simple face mask

## 2018-09-13 NOTE — Anesthesia Preprocedure Evaluation (Signed)
Anesthesia Evaluation  Patient identified by MRN, date of birth, ID band  Reviewed: Allergy & Precautions, NPO status , Patient's Chart, lab work & pertinent test results  Airway Mallampati: I  TM Distance: >3 FB Neck ROM: Full    Dental   Pulmonary    Pulmonary exam normal        Cardiovascular hypertension, Pt. on medications + CAD  Normal cardiovascular exam+ Valvular Problems/Murmurs AS      Neuro/Psych Anxiety    GI/Hepatic   Endo/Other    Renal/GU      Musculoskeletal   Abdominal   Peds  Hematology   Anesthesia Other Findings   Reproductive/Obstetrics                             Anesthesia Physical Anesthesia Plan  ASA: III  Anesthesia Plan: MAC   Post-op Pain Management:    Induction: Intravenous  PONV Risk Score and Plan: 1  Airway Management Planned: Simple Face Mask  Additional Equipment:   Intra-op Plan:   Post-operative Plan:   Informed Consent: I have reviewed the patients History and Physical, chart, labs and discussed the procedure including the risks, benefits and alternatives for the proposed anesthesia with the patient or authorized representative who has indicated his/her understanding and acceptance.       Plan Discussed with: CRNA and Surgeon  Anesthesia Plan Comments:         Anesthesia Quick Evaluation

## 2018-09-13 NOTE — Op Note (Signed)
[facility]   MULTIDISCIPLINARY HEART VALVE TEAM   TAVR OPERATIVE NOTE   Date of Procedure:  09/13/2018  Preoperative Diagnosis: Severe Aortic Stenosis   Postoperative Diagnosis: Same   Procedure:    Transcatheter Aortic Valve Replacement - Percutaneous Left Transfemoral Approach  Edwards Sapien 3 THV (size 26 mm, model # 9600TFX, serial # [id number])   Co-Surgeons:  Gaye Pollack, MD and Sherren Mocha, MD   Anesthesiologist:  Lillia Abed, MD  Echocardiographer:  Ena Dawley, MD  Pre-operative Echo Findings:  Severe aortic stenosis  Normal left ventricular systolic function  Post-operative Echo Findings:  no paravalvular leak  Normal left ventricular systolic function   BRIEF CLINICAL NOTE AND INDICATIONS FOR SURGERY  This 78 year old gentleman has stage D, severe, symptomatic aortic stenosis with New York Heart Association class II symptoms of exertional fatigue associated with atypical chest discomfort and some ankle and pedal edema. He is not very active at the present time due to the coronavirus restrictions as well as his peripheral neuropathy and may have exertional shortness of breath if that was not the case. I have personally reviewed his 2D echocardiogram, cardiac catheterization, and CTA studies. His echocardiogram shows a calcified aortic valve with a mean gradient of 37 mmHg and a dimensionless index of 0.21 consistent with severe aortic stenosis. There is also moderate aortic insufficiency. Left ventricular systolic function is normal. Cardiac catheterization showed no significant coronary disease. There is mild pulmonary hypertension. I agree that aortic valve replacement is indicated in this patient for relief of his symptoms and improvement of his quality of life as well as to prevent progressive left ventricular deterioration. I think transcatheter aortic valve replacement will be the best option given his age. His gated cardiac  CTA shows anatomy that is suitable for transcatheter aortic valve replacement using a Sapien 3 valve. His annular area appears suitable for a 26 mm valve.The right and left coronary height appear adequate. His noncoronary and right coronary sinuses are only 27 mm and the sinotubular junction diameter is 24 mm but there is no calcium at the ST J. There is some nodular calcification at the base of the left coronary cusp. His abdominal and pelvic CTA shows adequate pelvic vascular anatomy to allow transfemoral insertion.  The patient was counseled at length regarding treatment alternatives for management of severe symptomatic aortic stenosis. The risks and benefits of surgical intervention has been discussed in detail. Long-term prognosis with medical therapy was discussed. Alternative approaches such as conventional surgical aortic valve replacement, transcatheter aortic valve replacement, and palliative medical therapy were compared and contrasted at length. This discussion was placed in the context of the patient's own specific clinical presentation and past medical history. All ofhisquestions havebeen addressed.   Following the decision to proceed with transcatheter aortic valve replacement, a discussion was held regarding what types of management strategies would be attempted intraoperatively in the event of life-threatening complications, including whether or not the patient would be considered a candidate for the use of cardiopulmonary bypass and/or conversion to open sternotomy for attempted surgical intervention. The patient is aware of the fact that transient use of cardiopulmonary bypass may be necessary.His surgical risk is very low and I think he would certainly be a candidate for sternotomy to manage any intraoperative complications.  The patient has been advised of a variety of complications that might develop including but not limited to risks of death, stroke, paravalvular leak,  aortic dissection or other major vascular complications, aortic annulus rupture,  device embolization, cardiac rupture or perforation, mitral regurgitation, acute myocardial infarction, arrhythmia, heart block or bradycardia requiring permanent pacemaker placement, congestive heart failure, respiratory failure, renal failure, pneumonia, infection, other late complications related to structural valve deterioration or migration, or other complications that might ultimately cause a temporary or permanent loss of functional independence or other long term morbidity. The patient provides full informed consent for the procedure as described and all questions were answered.     DETAILS OF THE OPERATIVE PROCEDURE  PREPARATION:    The patient is brought to the operating room on the above mentioned date and appropriate monitoring was established by the anesthesia team. The patient is placed in the supine position on the operating table.  Intravenous antibiotics are administered. The patient is monitored closely throughout the procedure under conscious sedation.     Baseline transthoracic echocardiogram was performed. The patient's chest, abdomen, both groins, and both lower extremities are prepared and draped in a sterile manner. A time out procedure is performed.   PERIPHERAL ACCESS:    Using the modified Seldinger technique, femoral arterial and venous access was obtained with placement of 6 Fr sheaths on the right side.  A pigtail diagnostic catheter was passed through the right arterial sheath under fluoroscopic guidance into the aortic root.  A temporary transvenous pacemaker catheter was passed through the right femoral venous sheath under fluoroscopic guidance into the right ventricle.  The pacemaker was tested to ensure stable lead placement and pacemaker capture. Aortic root angiography was performed in order to determine the optimal angiographic angle for valve deployment.   TRANSFEMORAL ACCESS:    Percutaneous transfemoral access and sheath placement was performed using ultrasound guidance.  The left common femoral artery was cannulated using a micropuncture needle and appropriate location was verified using hand injection angiogram.  A pair of Abbott Perclose percutaneous closure devices were placed and a 6 French sheath replaced into the femoral artery.  The patient was heparinized systemically and ACT verified > 250 seconds.    A 14 Fr transfemoral E-sheath was introduced into the left common femoral artery after progressively dilating over an Amplatz superstiff wire. An AL-2 catheter was used to direct a straight-tip exchange length wire across the native aortic valve into the left ventricle. This was exchanged out for a pigtail catheter and position was confirmed in the LV apex. Simultaneous LV and Ao pressures were recorded.  The pigtail catheter was exchanged for an Amplatz Extra-stiff wire in the LV apex.   BALLOON AORTIC VALVULOPLASTY:   Not performed   TRANSCATHETER HEART VALVE DEPLOYMENT:   An Edwards Sapien 3 transcatheter heart valve (size 26 mm, model #9600TFX, serial #[id number]) was prepared and crimped per manufacturer's guidelines, and the proper orientation of the valve is confirmed on the [employer] delivery system. The valve was advanced through the introducer sheath using normal technique until in an appropriate position in the abdominal aorta beyond the sheath tip. The balloon was then retracted and using the fine-tuning wheel was centered on the valve. The valve was then advanced across the aortic arch using appropriate flexion of the catheter. The valve was carefully positioned across the aortic valve annulus. The Commander catheter was retracted using normal technique. Once final position of the valve has been confirmed by angiographic assessment, the valve is deployed while temporarily holding ventilation and during rapid ventricular pacing to maintain systolic  blood pressure < 50 mmHg and pulse pressure < 10 mmHg. The balloon inflation is held for >3 seconds after reaching  full deployment volume. Once the balloon has fully deflated the balloon is retracted into the ascending aorta and valve function is assessed using echocardiography. There is felt to be no paravalvular leak and no central aortic insufficiency.  The patient's hemodynamic recovery following valve deployment is good.  The deployment balloon and guidewire are both removed.    PROCEDURE COMPLETION:   The sheath was removed and femoral artery closure performed.  Protamine was administered once femoral arterial repair was complete. The temporary pacemaker, pigtail catheters and femoral sheaths were removed with manual pressure used for hemostasis.  A Mynx femoral closure device was utilized following removal of the diagnostic sheath in the right femoral artery.  The patient tolerated the procedure well and is transported to the surgical intensive care in stable condition. There were no immediate intraoperative complications. All sponge instrument and needle counts are verified correct at completion of the operation.   No blood products were administered during the operation.  The patient received a total of 40 mL of intravenous contrast during the procedure.   Gaye Pollack, MD 09/13/2018 9:53 AM

## 2018-09-13 NOTE — Op Note (Signed)
[facility]   MULTIDISCIPLINARY HEART VALVE TEAM   TAVR OPERATIVE NOTE   Date of Procedure:  09/13/2018  Preoperative Diagnosis: Severe Aortic Stenosis   Postoperative Diagnosis: Same   Procedure:    Transcatheter Aortic Valve Replacement - Percutaneous Transfemoral Approach  Edwards Sapien 3 THV (size 26 mm, model # 9600TFX, serial # [id number])   Co-Surgeons:  Gaye Pollack, MD and Sherren Mocha, MD  Anesthesiologist:  Lillia Abed, MD  Echocardiographer:  Ena Dawley, MD  Pre-operative Echo Findings:  Severe aortic stenosis  Normal left ventricular systolic function  Post-operative Echo Findings:  No paravalvular leak  unchanged left ventricular systolic function  BRIEF CLINICAL NOTE AND INDICATIONS FOR SURGERY  78 year old male with severe stage D1 aortic stenosis and New York Heart Association functional class II symptoms presents for TAVR.  He has undergone extensive multidisciplinary heart team review with preoperative testing including echo, cardiac catheterization, and CTA studies.  He is felt to be an appropriate candidate for transfemoral TAVR.  During the course of the patient's preoperative work up they have been evaluated comprehensively by a multidisciplinary team of specialists coordinated through the [facility] in the [facility].  They have been demonstrated to suffer from symptomatic severe aortic stenosis as noted above. The patient has been counseled extensively as to the relative risks and benefits of all options for the treatment of severe aortic stenosis including long term medical therapy, conventional surgery for aortic valve replacement, and transcatheter aortic valve replacement.  Based upon review of all of the patient's preoperative diagnostic tests they are felt to be candidate for transcatheter aortic valve replacement using the transfemoral approach as an alternative to   conventional surgery.    Following the decision to proceed with transcatheter aortic valve replacement, a discussion has been held regarding what types of management strategies would be attempted intraoperatively in the event of life-threatening complications, including whether or not the patient would be considered a candidate for the use of cardiopulmonary bypass and/or conversion to open sternotomy for attempted surgical intervention.  The patient has been advised of a variety of complications that might develop peculiar to this approach including but not limited to risks of death, stroke, paravalvular leak, aortic dissection or other major vascular complications, aortic annulus rupture, device embolization, cardiac rupture or perforation, acute myocardial infarction, arrhythmia, heart block or bradycardia requiring permanent pacemaker placement, congestive heart failure, respiratory failure, renal failure, pneumonia, infection, other late complications related to structural valve deterioration or migration, or other complications that might ultimately cause a temporary or permanent loss of functional independence or other long term morbidity.  The patient provides full informed consent for the procedure as described and all questions were answered preoperatively.  DETAILS OF THE OPERATIVE PROCEDURE  PREPARATION:   The patient is brought to the operating room on the above mentioned date and central monitoring was established by the anesthesia team including placement of a central venous catheter and radial arterial line. The patient is placed in the supine position on the operating table.  Intravenous antibiotics are administered. The patient is monitored closely throughout the procedure under conscious sedation.  Baseline transthoracic echocardiogram is performed. The patient's chest, abdomen, both groins, and both lower extremities are prepared and draped in a sterile manner. A time out procedure is  performed.   PERIPHERAL ACCESS:   Using ultrasound guidance, femoral arterial and venous access is obtained with placement of 6 Fr sheaths on the right side.  A pigtail diagnostic catheter was passed through the femoral arterial sheath under fluoroscopic guidance into the aortic root.  A temporary transvenous pacemaker catheter was passed through the femoral venous sheath under fluoroscopic guidance into the right ventricle.  The pacemaker was tested to ensure stable lead placement and pacemaker capture. Aortic root angiography was performed in order to determine the optimal angiographic angle for valve deployment.  TRANSFEMORAL ACCESS:  A micropuncture technique is used to access the left femoral artery under fluoroscopic and ultrasound guidance.  2 Perclose devices are deployed at 10' and 2' positions to 'PreClose' the femoral artery. An 8 French sheath is placed and then an Amplatz Superstiff wire is advanced through the sheath. This is changed out for a 14 French transfemoral E-Sheath after progressively dilating over the Superstiff wire.  An AL-2 catheter was used to direct a straight-tip exchange length wire across the native aortic valve into the left ventricle. This was exchanged out for a pigtail catheter and position was confirmed in the LV apex. Simultaneous LV and Ao pressures were recorded.  The pigtail catheter was exchanged for an Amplatz Extra-stiff wire in the LV apex.   BALLOON AORTIC VALVULOPLASTY:  Not performed  TRANSCATHETER HEART VALVE DEPLOYMENT:  An Edwards Sapien 3 transcatheter heart valve (size 26 mm, model #9600TFX, serial #[id number]) was prepared and crimped per manufacturer's guidelines, and the proper orientation of the valve is confirmed on the [employer] delivery system. The valve was advanced through the introducer sheath using normal technique until in an appropriate position in the abdominal aorta beyond the sheath tip. The balloon was then retracted and using  the fine-tuning wheel was centered on the valve. The valve was then advanced across the aortic arch using appropriate flexion of the catheter. The valve was carefully positioned across the aortic valve annulus. The Commander catheter was retracted using normal technique. Once final position of the valve has been confirmed by angiographic assessment, the valve is deployed while temporarily holding ventilation and during rapid ventricular pacing to maintain systolic blood pressure < 50 mmHg and pulse pressure < 10 mmHg. The balloon inflation is held for >3 seconds after reaching full deployment volume. Once the balloon has fully deflated the balloon is retracted into the ascending aorta and valve function is assessed using echocardiography. There is felt to be no paravalvular leak and no central aortic insufficiency.  The patient's hemodynamic recovery following valve deployment is good.  The deployment balloon and guidewire are both removed. Echo demostrated acceptable post-procedural gradients, stable mitral valve function, and no aortic insufficiency.     PROCEDURE COMPLETION:  The sheath was removed and femoral artery closure is performed using the 2 previously deployed Perclose devices.  Protamine is administered once femoral arterial repair was complete. The site is clear with no evidence of bleeding or hematoma after the sutures are tightened. The temporary pacemaker, pigtail catheters and femoral sheaths were removed with manual pressure used for hemostasis.   The patient tolerated the procedure well and is transported to the surgical intensive care in stable condition. There were no immediate intraoperative complications. All sponge instrument and needle counts are verified correct at completion of the operation.   The patient received a total of 40 mL of intravenous contrast during the procedure.   Sherren Mocha, MD 09/13/2018 9:24 AM

## 2018-09-13 NOTE — Progress Notes (Signed)
Patient Son Quita Skye contacted and updated on patient. Nelda Bucks, Bettina Gavia RN

## 2018-09-13 NOTE — Anesthesia Postprocedure Evaluation (Signed)
Anesthesia Post Note  Patient: James Cardenas  Procedure(s) Performed: TRANSCATHETER AORTIC VALVE REPLACEMENT, TRANSFEMORAL (N/A Chest) TRANSESOPHAGEAL ECHOCARDIOGRAM (TEE) (N/A )     Patient location during evaluation: PACU Anesthesia Type: MAC Level of consciousness: awake and alert Pain management: pain level controlled Vital Signs Assessment: post-procedure vital signs reviewed and stable Respiratory status: spontaneous breathing, nonlabored ventilation, respiratory function stable and patient connected to nasal cannula oxygen Cardiovascular status: stable and blood pressure returned to baseline Postop Assessment: no apparent nausea or vomiting Anesthetic complications: no    Last Vitals:  Vitals:   09/13/18 1300 09/13/18 1400  BP: 113/60 112/66  Pulse: (!) 53 (!) 52  Resp: 17 14  Temp:    SpO2: 99% 98%    Last Pain:  Vitals:   09/13/18 1215  TempSrc:   PainSc: 0-No pain                 Demba Nigh DAVID

## 2018-09-13 NOTE — Progress Notes (Signed)
  [place] VALVE TEAM  Patient doing well s/p TAVR. He is hemodynamically stable. Groin sites stable. ECG with non specific IVCD but no high grade block. Arterial line discontinued and transferred to 4E. Plan for early ambulation after bedrest completed and hopeful discharge over the next 24-48 hours.   Angelena Form PA-C  [facility]  Pager [phone]

## 2018-09-13 NOTE — Progress Notes (Signed)
  Echocardiogram 2D Echocardiogram has been performed.  James Cardenas 09/13/2018, 10:54 AM

## 2018-09-13 NOTE — Interval H&P Note (Signed)
History and Physical Interval Note:  09/13/2018 7:06 AM  James Cardenas  has presented today for surgery, with the diagnosis of Severe Aortic Stenosis.  The various methods of treatment have been discussed with the patient and family. After consideration of risks, benefits and other options for treatment, the patient has consented to  Procedure(s): TRANSCATHETER AORTIC VALVE REPLACEMENT, TRANSFEMORAL (N/A) TRANSESOPHAGEAL ECHOCARDIOGRAM (TEE) (N/A) as a surgical intervention.  The patient's history has been reviewed, patient examined, no change in status, stable for surgery.  I have reviewed the patient's chart and labs.  Questions were answered to the patient's satisfaction.     James Cardenas   Shortness of breath: Yes.   If yes: with what activity?: moderate Worse than previously noted?: No.  New edema, PND, orthopnea: No.  Recent decrease in activity i.e. more difficulty walking to mailbox, climbing stairs, etc: No.  Changes in sleeping i.e. need to utilize to sleep on more pillows, sitting up, etc: No.  Changes since last seen in pre-op visit: No.   BNP is 317

## 2018-09-13 NOTE — Progress Notes (Signed)
   R radial art line was D/C'd, and manual pressure was held for 7 min. The site is soft and non tender.

## 2018-09-13 NOTE — Progress Notes (Signed)
Patient ambulated in to bathroom, B groins level 0 no hematoma noted. Rt groin still with stain marking to dressing, unchanged. Patient assisted to chair. Call bell with in reach will monitor patient. James Cardenas, [employer]

## 2018-09-13 NOTE — Progress Notes (Signed)
Patient arrived from [facility] lab to [id number]. Patient placed on monitor and ccmd made aware. Vital signs obtained bp 118/67 heart rate 55. Patient with B groins level 0 no hematoma noted at this time. However Rt groin with stain marked dressing but soft. Will monitor patient. Jaramie Bastos, Bettina Gavia rN

## 2018-09-13 NOTE — Transfer of Care (Signed)
Immediate Anesthesia Transfer of Care Note  Patient: James Cardenas  Procedure(s) Performed: TRANSCATHETER AORTIC VALVE REPLACEMENT, TRANSFEMORAL (N/A Chest) TRANSESOPHAGEAL ECHOCARDIOGRAM (TEE) (N/A )  Patient Location: Cath Lab  Anesthesia Type:MAC  Level of Consciousness: awake, alert  and responds to stimulation  Airway & Oxygen Therapy: Patient Spontanous Breathing and Patient connected to nasal cannula oxygen  Post-op Assessment: Report given to RN and Post -op Vital signs reviewed and stable  Post vital signs: Reviewed and stable  Last Vitals:  Vitals Value Taken Time  BP 155/82 09/13/2018  9:38 AM  Temp 36.4 C 09/13/2018  9:36 AM  Pulse 63 09/13/2018  9:39 AM  Resp 19 09/13/2018  9:39 AM  SpO2 88 % 09/13/2018  9:39 AM  Vitals shown include unvalidated device data.  Last Pain:  Vitals:   09/13/18 0936  TempSrc: Temporal  PainSc: 0-No pain      Patients Stated Pain Goal: 3 (38/10/17 5102)  Complications: No apparent anesthesia complications

## 2018-09-13 NOTE — Anesthesia Procedure Notes (Signed)
Arterial Line Insertion Start/End6/05/2018 7:00 AM Performed by: Elayne Snare, CRNA, CRNA  Lidocaine 1% used for infiltration radial was placed Catheter size: 20 G Hand hygiene performed  and maximum sterile barriers used  Allen's test indicative of satisfactory collateral circulation Attempts: 1 Procedure performed without using ultrasound guided technique. Following insertion, dressing applied and Biopatch. Post procedure assessment: normal  Patient tolerated the procedure well with no immediate complications.

## 2018-09-14 ENCOUNTER — Inpatient Hospital Stay (HOSPITAL_COMMUNITY): Payer: Medicare Other

## 2018-09-14 ENCOUNTER — Other Ambulatory Visit: Payer: Self-pay | Admitting: Physician Assistant

## 2018-09-14 ENCOUNTER — Encounter (HOSPITAL_COMMUNITY): Payer: Self-pay | Admitting: Cardiovascular Disease

## 2018-09-14 DIAGNOSIS — Z954 Presence of other heart-valve replacement: Secondary | ICD-10-CM

## 2018-09-14 DIAGNOSIS — I350 Nonrheumatic aortic (valve) stenosis: Secondary | ICD-10-CM

## 2018-09-14 DIAGNOSIS — Z952 Presence of prosthetic heart valve: Secondary | ICD-10-CM

## 2018-09-14 LAB — BASIC METABOLIC PANEL
Anion gap: 9 (ref 5–15)
BUN: 18 mg/dL (ref 8–23)
CO2: 23 mmol/L (ref 22–32)
Calcium: 8.6 mg/dL — ABNORMAL LOW (ref 8.9–10.3)
Chloride: 103 mmol/L (ref 98–111)
Creatinine, Ser: 1 mg/dL (ref 0.61–1.24)
GFR calc Af Amer: >60 mL/min (ref >60)
GFR calc non Af Amer: >60 mL/min (ref >60)
Glucose, Bld: 121 mg/dL — ABNORMAL HIGH (ref 70–99)
Potassium: 4.4 mmol/L (ref 3.5–5.1)
Sodium: 135 mmol/L (ref 135–145)

## 2018-09-14 LAB — CBC
HCT: 37.7 % — ABNORMAL LOW (ref 39.0–52.0)
Hemoglobin: 12.7 g/dL — ABNORMAL LOW (ref 13.0–17.0)
MCH: 32.2 pg (ref 26.0–34.0)
MCHC: 33.7 g/dL (ref 30.0–36.0)
MCV: 95.7 fL (ref 80.0–100.0)
Platelets: 147 10*3/uL — ABNORMAL LOW (ref 150–400)
RBC: 3.94 MIL/uL — ABNORMAL LOW (ref 4.22–5.81)
RDW: 13.1 % (ref 11.5–15.5)
WBC: 7.4 10*3/uL (ref 4.0–10.5)
nRBC: 0 % (ref 0.0–0.2)

## 2018-09-14 LAB — ECHOCARDIOGRAM LIMITED
Height: 75 in
Weight: 3096 oz

## 2018-09-14 LAB — MAGNESIUM: Magnesium: 1.9 mg/dL (ref 1.7–2.4)

## 2018-09-14 MED ORDER — CLOPIDOGREL BISULFATE 75 MG PO TABS: 75.0000 mg | ORAL_TABLET | Freq: Every day | ORAL | 1 refills | Status: DC

## 2018-09-14 NOTE — Discharge Summary (Signed)
[facility]VE TEAM  Discharge Summary    Patient ID: James Cardenas MRN: [id number]; DOB: 06-09-1940  Admit date: 09/13/2018 Discharge date: 09/14/2018  Primary Care Provider: Olin Hauser, DO  Primary Cardiologist: Dr. Rockey Situ / Dr. Burt Knack & Dr. Cyndia Bent (TAVR).   Discharge Diagnoses    Principal Problem:   S/P TAVR (transcatheter aortic valve replacement) Active Problems:   BPH without urinary obstruction   Essential hypertension   Peripheral neuropathy   Severe aortic valve stenosis   MGUS (monoclonal gammopathy of unknown significance)   Pulmonary hypertension (HCC)   Acute on chronic diastolic (congestive) heart failure (HCC)   Carotid artery disease (HCC)   Allergies Allergies  Allergen Reactions   Nickel Itching    Diagnostic Studies/Procedures     TAVR OPERATIVE NOTE   Date of Procedure:                09/13/2018  Preoperative Diagnosis:      Severe Aortic Stenosis   Postoperative Diagnosis:    Same   Procedure:        Transcatheter Aortic Valve Replacement - Percutaneous Left Transfemoral Approach             Edwards Sapien 3 THV (size 26 mm, model # 9600TFX, serial # [id number])              Co-Surgeons:                        Gaye Pollack, MD and Sherren Mocha, MD   Anesthesiologist:                  Lillia Abed, MD  Echocardiographer:              Ena Dawley, MD  Pre-operative Echo Findings: ? Severe aortic stenosis ? Normal left ventricular systolic function  Post-operative Echo Findings: ? no paravalvular leak ? Normal left ventricular systolic function  _____________    Echo 09/14/18: IMPRESSIONS  1. The left ventricle has normal systolic function, with an ejection fraction of 55%. The cavity size was normal. There is severe concentric left ventricular hypertrophy. Left ventricular diastolic function could not be evaluated due to nondiagnostic  images. No evidence  of left ventricular regional wall motion abnormalities.  2. The right ventricle has normal systolic function. The cavity was normal. There is no increase in right ventricular wall thickness.  3. - TAVR: S/P 16mm Edwards Sapien 3 TAVR with normal functioning prosthesis. There is no perivalvular AI. AV Area (VTI): 1.60 cm. AV Mean Grad: 19.0 . AV Vmax: 309.50 .  History of Present Illness     James Cardenas is a 78 y.o. male with a history of HTN, MGUS, pulm HTN, carotid artery disease, neuropathy with mild gait imbalance, severe AS/mod AI who presented to [facility] on 09/13/18 for planned TAVR.  He has always been very active as an adult playing tennis, exercising, and riding a bicycle but over the past few years has been slow due to development of peripheral neuropathy affecting his balance.  Over the past 6 months or so he has developed exertional fatigue as well as some chest tightness.  He denies any shortness of breath or orthopnea.  He has had some ankle and pedal edema.  He denies any dizziness or syncope.  His most recent echocardiogram on 04/29/2018 showed a severely calcified aortic valve with a mean gradient of 37 mmHg and a  peak gradient of 62 mmHg.  There is moderate aortic insufficiency. Left ventricular ejection fraction was 55% with moderate concentric left ventricular hypertrophy. He underwent cardiac catheterization on 07/28/2018 showing no significant coronary disease.  Right heart pressures were normal.  The patient has been evaluated by the multidisciplinary valve team and felt to have severe, symptomatic aortic stenosis and to be a suitable candidate for TAVR, which was set up for 09/13/2018.    Hospital Course     Consultants: none  Severe AS: s/p successful TAVR with a 26 mm Edwards Sapien 3 THV via the TF approach on 09/13/2018. Post operative echo showed EF 55%, normally functioning TAVR with no PVL and mean gradient 19 mm Hg. Groin sites are stable. ECG with sinus (old RBBB) and no  high grade heart block. Continue Asprin and plavix.   Acute on chronic diastolic CHF: as evidenced by elevated BNP >300 on pre admission lab work. This has been treated with TAVR. Resume PRN lasix at discharge  HTN: BP elevated. Plan to resume home meds  Carotid artery diease: dopplers showed 1-39% RICA and 40-59% RICA stenosis. Plan to repeat dopplers in 08/2019.   Pulmonary nodules: noted on pre TAVR CT. Two tiny solid pulmonary nodules, largest 3 mm. No follow-up needed if patient is low-risk. 12 month CT if high risk. Will discuss as an outpatient  _____________  Discharge Vitals Blood pressure (!) 156/86, pulse 71, temperature 98.4 F (36.9 C), temperature source Oral, resp. rate 15, height 6\' 3"  (1.905 m), weight 87.8 kg, SpO2 98 %.  Filed Weights   09/13/18 0610 09/13/18 1158 09/14/18 0500  Weight: 90.4 kg 90.4 kg 87.8 kg    Labs & Radiologic Studies    CBC Recent Labs    09/13/18 1028 09/14/18 0320  WBC  --  7.4  HGB 12.2* 12.7*  HCT 36.0* 37.7*  MCV  --  95.7  PLT  --  564*   Basic Metabolic Panel Recent Labs    09/13/18 0904 09/13/18 1028 09/13/18 1034 09/14/18 0320  NA 140 140  --  135  K 4.2 4.1  --  4.4  CL  --   --   --  103  CO2  --   --   --  23  GLUCOSE 136*  --   --  121*  BUN  --   --   --  18  CREATININE  --   --  0.70 1.00  CALCIUM  --   --   --  8.6*  MG  --   --   --  1.9   Liver Function Tests No results for input(s): AST, ALT, ALKPHOS, BILITOT, PROT, ALBUMIN in the last 72 hours. No results for input(s): LIPASE, AMYLASE in the last 72 hours. Cardiac Enzymes No results for input(s): CKTOTAL, CKMB, CKMBINDEX, TROPONINI in the last 72 hours. BNP Invalid input(s): POCBNP D-Dimer No results for input(s): DDIMER in the last 72 hours. Hemoglobin A1C No results for input(s): HGBA1C in the last 72 hours. Fasting Lipid Panel No results for input(s): CHOL, HDL, LDLCALC, TRIG, CHOLHDL, LDLDIRECT in the last 72 hours. Thyroid Function Tests No  results for input(s): TSH, T4TOTAL, T3FREE, THYROIDAB in the last 72 hours.  Invalid input(s): FREET3 _____________  Dg Chest 2 View  Result Date: 09/09/2018 CLINICAL DATA:  Severe aortic stenosis, preop. EXAM: CHEST - 2 VIEW COMPARISON:  10/18/2008. FINDINGS: Mild cardiomegaly. Lungs are clear. No effusions. No acute bony abnormality. IMPRESSION: Mild cardiomegaly.  No active  disease. Electronically Signed   By: Rolm Baptise M.D.   On: 09/09/2018 12:25   Ct Coronary Morph W/cta Cor W/score W/ca W/cm &/or Wo/cm  Addendum Date: 08/25/2018   ADDENDUM REPORT: 08/25/2018 15:27 EXAM: OVER-READ INTERPRETATION  CT CHEST The following report is an over-read performed by radiologist Dr. Samara Snide [facility] on 08/25/2018. This over-read does not include interpretation of cardiac or coronary anatomy or pathology. The cardiac CTA interpretation by the cardiologist is attached. COMPARISON:  10/18/2008 chest radiograph. FINDINGS: Please see the separate concurrent chest CT angiogram report for details. IMPRESSION: Please see the separate concurrent chest CT angiogram report for details. Electronically Signed   By: Ilona Sorrel M.D.   On: 08/25/2018 15:27   Result Date: 08/25/2018 CLINICAL DATA:  Aortic Stenosis EXAM: Cardiac TAVR CT TECHNIQUE: The patient was scanned on a Siemens Force 867 slice scanner. A 120 kV retrospective scan was triggered in the ascending thoracic aorta at 140 HU's. Gantry rotation speed was 250 msecs and collimation was .6 mm. No beta blockade or nitro were given. The 3D data set was reconstructed in 5% intervals of the R-R cycle. Systolic and diastolic phases were analyzed on a dedicated work station using MPR, MIP and VRT modes. The patient received 80 cc of contrast. FINDINGS: Aortic Valve: Tri leaflet calcified with restricted leaflet motion Central area of mal coaptation explaining central AR Aorta: Normal arch vessels no aneurysm mild calcific aortic atherosclerosis  Sino-tubular Junction: 24 mm Ascending Thoracic Aorta: 34 mm Aortic Arch: 25 mm Descending Thoracic Aorta: 23 mm Sinus of Valsalva Measurements: Non-coronary: 26.7 mm Right - coronary: 26.6 mm Left -   coronary: 29.4 mm Coronary Artery Height above Annulus: Left Main: 12.6 mm above annulus Right Coronary: 14 mm above annulus Virtual Basal Annulus Measurements: Maximum / Minimum Diameter: 26.04 mm x 21.3 mm Perimeter: 77.5 mm Area: 465 mm2 Coronary Arteries: Sufficient height above annulus for deployment Optimum Fluoroscopic Angle for Delivery: LAO 6 Caudal 6 degrees IMPRESSION: 1. Tri leaflet AV with annular area of 465 mm2 suitable for a 26 mm Sapien 3 valve 2. Bulky nodular calcification at the base of the left coronary cusp at STJ 3.  Normal aortic root 3.4 cm 4.  Coronary arteries sufficient height above annulus for deployment 5.  Optimal angiographic angle for deployment LAO 6 Caudal 6 degrees Jenkins Rouge Electronically Signed: By: Jenkins Rouge M.D. On: 08/25/2018 12:27   Dg Chest Port 1 View  Result Date: 09/13/2018 CLINICAL DATA:  Status post TAVR EXAM: PORTABLE CHEST 1 VIEW COMPARISON:  09/09/2018 FINDINGS: There are changes consistent with interval TAVR placement. Lungs are well aerated bilaterally. No focal infiltrate or effusion is seen. No bony abnormality is noted. IMPRESSION: Status post TAVR.  No acute abnormality noted. Electronically Signed   By: Inez Catalina M.D.   On: 09/13/2018 13:00   Ct Angio Chest Aorta W &/or Wo Contrast  Result Date: 08/25/2018 CLINICAL DATA:  78 year old male with severe symptomatic aortic stenosis. Pre-TAVR evaluation. EXAM: CT ANGIOGRAPHY CHEST, ABDOMEN AND PELVIS TECHNIQUE: Multidetector CT imaging through the chest, abdomen and pelvis was performed using the standard protocol during bolus administration of intravenous contrast. Multiplanar reconstructed images and MIPs were obtained and reviewed to evaluate the vascular anatomy. CONTRAST:  130mL OMNIPAQUE  IOHEXOL 350 MG/ML SOLN COMPARISON:  10/18/2008 chest radiograph. FINDINGS: CTA CHEST FINDINGS Cardiovascular: Mild cardiomegaly. No significant pericardial effusion/thickening. Marked thickening and coarse calcification of the aortic valve. Mildly atherosclerotic nonaneurysmal thoracic aorta. Normal caliber pulmonary  arteries. No central pulmonary emboli. Mediastinum/Nodes: No discrete thyroid nodules. Unremarkable esophagus. No pathologically enlarged axillary, mediastinal or hilar lymph nodes. Lungs/Pleura: No pneumothorax. No pleural effusion. No acute consolidative airspace disease or lung masses. Two tiny scattered solid pulmonary nodules, largest 3 mm in the subpleural apical left upper lobe (series 15/image 12). Musculoskeletal: No aggressive appearing focal osseous lesions. Marked thoracic spondylosis. CTA ABDOMEN AND PELVIS FINDINGS Hepatobiliary: Normal liver with no liver mass. Normal gallbladder with no radiopaque cholelithiasis. No biliary ductal dilatation. Pancreas: Normal, with no mass or duct dilation. Spleen: Normal size. No mass. Adrenals/Urinary Tract: Normal adrenals. No hydronephrosis. No contour deforming renal masses. Normal bladder. Stomach/Bowel: Normal non-distended stomach. Normal caliber small bowel with no small bowel wall thickening. Normal appendix. Normal large bowel with no diverticulosis, large bowel wall thickening or pericolonic fat stranding. Vascular/Lymphatic: Atherosclerotic nonaneurysmal abdominal aorta. Patent splenic and renal veins. No pathologically enlarged lymph nodes in the abdomen or pelvis. Reproductive: Moderate prostatomegaly. Other: No pneumoperitoneum, ascites or focal fluid collection. Musculoskeletal: No aggressive appearing focal osseous lesions. Marked lumbar spondylosis. VASCULAR MEASUREMENTS PERTINENT TO TAVR: AORTA: Minimal Aortic Diameter-17.4 x 15.9 mm Severity of Aortic Calcification-mild RIGHT PELVIS: Right Common Iliac Artery - Minimal Diameter-9.9 x  9.1 mm Tortuosity-mild Calcification-mild Right External Iliac Artery - Minimal Diameter-8.9 x 8.7 mm Tortuosity-moderate Calcification-none Right Common Femoral Artery - Minimal Diameter-9.0 x 8.3 mm Tortuosity-mild Calcification-minimal LEFT PELVIS: Left Common Iliac Artery - Minimal Diameter-9.8 x 8.9 mm Tortuosity-mild Calcification-mild Left External Iliac Artery - Minimal Diameter-8.8 x 8.1 mm Tortuosity-moderate Calcification-none Left Common Femoral Artery - Minimal Diameter-7.7 x 6.9 mm Tortuosity-mild Calcification-mild Review of the MIP images confirms the above findings. IMPRESSION: 1. Vascular findings and measurements pertinent to potential TAVR procedure, as detailed. 2. Marked thickening and calcification of the aortic valve, compatible with the reported history of severe symptomatic aortic stenosis. 3. Mild cardiomegaly. 4. Two tiny solid pulmonary nodules, largest 3 mm. No follow-up needed if patient is low-risk (and has no known or suspected primary neoplasm). Non-contrast chest CT can be considered in 12 months if patient is high-risk. This recommendation follows the consensus statement: Guidelines for Management of Incidental Pulmonary Nodules Detected on CT Images:From the Fleischner Society 2017; published online before print (10.1148/radiol.[id number]). 5. Moderately enlarged prostate. 6. Aortic Atherosclerosis (ICD10-I70.0). Electronically Signed   By: Ilona Sorrel M.D.   On: 08/25/2018 16:01   Vas US Carotid  Result Date: 08/25/2018 Carotid Arterial Duplex Study Indications:       Preop for TAVR- severe aortic stenosis. Risk Factors:      Coronary artery disease. Comparison Study:  No priors. Performing Technologist: Oda Cogan RDMS, RVT  Examination Guidelines: A complete evaluation includes B-mode imaging, spectral Doppler, color Doppler, and power Doppler as needed of all accessible portions of each vessel. Bilateral testing is considered an integral part of a complete  examination. Limited examinations for reoccurring indications may be performed as noted.  Right Carotid Findings: +----------+--------+--------+--------+--------+--------+             PSV cm/s EDV cm/s Stenosis Describe Comments  +----------+--------+--------+--------+--------+--------+  CCA Prox   96       25                                   +----------+--------+--------+--------+--------+--------+  CCA Distal 79       24                                   +----------+--------+--------+--------+--------+--------+  ICA Prox   67       25       1-39%    calcific           +----------+--------+--------+--------+--------+--------+  ICA Distal 96       35                                   +----------+--------+--------+--------+--------+--------+  ECA        88       20                                   +----------+--------+--------+--------+--------+--------+ +----------+--------+-------+----------------+-------------------+             PSV cm/s EDV cms Describe         Arm Pressure (mmHG)  +----------+--------+-------+----------------+-------------------+  Subclavian 97               Multiphasic, WNL                      +----------+--------+-------+----------------+-------------------+ +---------+--------+--+--------+--+---------+  Vertebral PSV cm/s 42 EDV cm/s 14 Antegrade  +---------+--------+--+--------+--+---------+  Left Carotid Findings: +----------+--------+--------+--------+------------+------------------+             PSV cm/s EDV cm/s Stenosis Describe     Comments            +----------+--------+--------+--------+------------+------------------+  CCA Prox   107      33                                                 +----------+--------+--------+--------+------------+------------------+  CCA Distal 102      34                                                 +----------+--------+--------+--------+------------+------------------+  ICA Prox   161      61       40-59%   heterogenous                      +----------+--------+--------+--------+------------+------------------+  ICA Mid    193      61       40-59%   heterogenous Upper end of scale  +----------+--------+--------+--------+------------+------------------+  ICA Distal 93       34                                                 +----------+--------+--------+--------+------------+------------------+  ECA        99       27                                                 +----------+--------+--------+--------+------------+------------------+ +----------+--------+--------+----------------+-------------------+  Subclavian PSV cm/s EDV cm/s Describe         Arm Pressure (mmHG)  +----------+--------+--------+----------------+-------------------+  125               Multiphasic, WNL                      +----------+--------+--------+----------------+-------------------+ +---------+--------+--+--------+--+---------+  Vertebral PSV cm/s 76 EDV cm/s 19 Antegrade  +---------+--------+--+--------+--+---------+  Summary: Right Carotid: Velocities in the right ICA are consistent with a 1-39% stenosis. Left Carotid: Velocities in the left ICA are consistent with a 40-59% stenosis. Vertebrals: Bilateral vertebral arteries demonstrate antegrade flow. *See table(s) above for measurements and observations.  Electronically signed by Monica Martinez MD on 08/25/2018 at 4:23:21 PM.    Final    Ct Angio Abd/pel W/ And/or W/o  Result Date: 08/25/2018 CLINICAL DATA:  78 year old male with severe symptomatic aortic stenosis. Pre-TAVR evaluation. EXAM: CT ANGIOGRAPHY CHEST, ABDOMEN AND PELVIS TECHNIQUE: Multidetector CT imaging through the chest, abdomen and pelvis was performed using the standard protocol during bolus administration of intravenous contrast. Multiplanar reconstructed images and MIPs were obtained and reviewed to evaluate the vascular anatomy. CONTRAST:  169mL OMNIPAQUE IOHEXOL 350 MG/ML SOLN COMPARISON:  10/18/2008 chest radiograph. FINDINGS: CTA CHEST  FINDINGS Cardiovascular: Mild cardiomegaly. No significant pericardial effusion/thickening. Marked thickening and coarse calcification of the aortic valve. Mildly atherosclerotic nonaneurysmal thoracic aorta. Normal caliber pulmonary arteries. No central pulmonary emboli. Mediastinum/Nodes: No discrete thyroid nodules. Unremarkable esophagus. No pathologically enlarged axillary, mediastinal or hilar lymph nodes. Lungs/Pleura: No pneumothorax. No pleural effusion. No acute consolidative airspace disease or lung masses. Two tiny scattered solid pulmonary nodules, largest 3 mm in the subpleural apical left upper lobe (series 15/image 12). Musculoskeletal: No aggressive appearing focal osseous lesions. Marked thoracic spondylosis. CTA ABDOMEN AND PELVIS FINDINGS Hepatobiliary: Normal liver with no liver mass. Normal gallbladder with no radiopaque cholelithiasis. No biliary ductal dilatation. Pancreas: Normal, with no mass or duct dilation. Spleen: Normal size. No mass. Adrenals/Urinary Tract: Normal adrenals. No hydronephrosis. No contour deforming renal masses. Normal bladder. Stomach/Bowel: Normal non-distended stomach. Normal caliber small bowel with no small bowel wall thickening. Normal appendix. Normal large bowel with no diverticulosis, large bowel wall thickening or pericolonic fat stranding. Vascular/Lymphatic: Atherosclerotic nonaneurysmal abdominal aorta. Patent splenic and renal veins. No pathologically enlarged lymph nodes in the abdomen or pelvis. Reproductive: Moderate prostatomegaly. Other: No pneumoperitoneum, ascites or focal fluid collection. Musculoskeletal: No aggressive appearing focal osseous lesions. Marked lumbar spondylosis. VASCULAR MEASUREMENTS PERTINENT TO TAVR: AORTA: Minimal Aortic Diameter-17.4 x 15.9 mm Severity of Aortic Calcification-mild RIGHT PELVIS: Right Common Iliac Artery - Minimal Diameter-9.9 x 9.1 mm Tortuosity-mild Calcification-mild Right External Iliac Artery - Minimal  Diameter-8.9 x 8.7 mm Tortuosity-moderate Calcification-none Right Common Femoral Artery - Minimal Diameter-9.0 x 8.3 mm Tortuosity-mild Calcification-minimal LEFT PELVIS: Left Common Iliac Artery - Minimal Diameter-9.8 x 8.9 mm Tortuosity-mild Calcification-mild Left External Iliac Artery - Minimal Diameter-8.8 x 8.1 mm Tortuosity-moderate Calcification-none Left Common Femoral Artery - Minimal Diameter-7.7 x 6.9 mm Tortuosity-mild Calcification-mild Review of the MIP images confirms the above findings. IMPRESSION: 1. Vascular findings and measurements pertinent to potential TAVR procedure, as detailed. 2. Marked thickening and calcification of the aortic valve, compatible with the reported history of severe symptomatic aortic stenosis. 3. Mild cardiomegaly. 4. Two tiny solid pulmonary nodules, largest 3 mm. No follow-up needed if patient is low-risk (and has no known or suspected primary neoplasm). Non-contrast chest CT can be considered in 12 months if patient is high-risk. This recommendation follows the consensus statement: Guidelines for Management of Incidental Pulmonary Nodules Detected on CT Images:From the Fleischner Society 2017; published online before  print (10.1148/radiol.7619509326). 5. Moderately enlarged prostate. 6. Aortic Atherosclerosis (ICD10-I70.0). Electronically Signed   By: Ilona Sorrel M.D.   On: 08/25/2018 16:01   Disposition   Pt is being discharged home today in good condition.  Follow-up Plans & Appointments    Follow-up Information    Eileen Stanford, PA-C. Go on 09/21/2018.   Specialties:  Cardiology, Radiology Why:  @ 2pm, for a VIRTUAL VISIT. Please review consent and virtual visit information in your discharge paper work Contact information: [address] [place] [address] [phone]            Discharge Medications   Allergies as of 09/14/2018      Reactions   Nickel Itching      Medication List    TAKE these medications     Alpha-Lipoic Acid 600 MG Caps Take 600 mg by mouth every morning.   ALPRAZolam 0.5 MG tablet Commonly known as:  XANAX Take 1 tablet (0.5 mg total) by mouth 2 (two) times daily as needed for anxiety.   aspirin EC 81 MG tablet Take 81 mg by mouth every evening.   Azelaic Acid 15 % cream Apply 1 application topically daily. After skin is thoroughly washed and patted dry, gently but thoroughly massage a thin film of azelaic acid cream into the affected area once daily   cholecalciferol 25 MCG (1000 UT) tablet Commonly known as:  VITAMIN D3 Take 1,000 Units by mouth daily.   clopidogrel 75 MG tablet Commonly known as:  PLAVIX Take 1 tablet (75 mg total) by mouth daily with breakfast.   furosemide 20 MG tablet Commonly known as:  LASIX Take 1 tablet (20 mg total) by mouth as needed (As needed for leg swelling).   GLUCOSAMINE-MSM PO Take 2 tablets by mouth daily at 3 pm.   losartan 100 MG tablet Commonly known as:  COZAAR Take 1 tablet (100 mg total) by mouth daily.   LUBRICATING EYE DROPS OP Place 1 drop into both eyes 2 (two) times a day.   MINI FISH OIL PO Take 2 capsules by mouth daily at 12 noon.   mirabegron ER 50 MG Tb24 tablet Commonly known as:  MYRBETRIQ Take 1 tablet (50 mg total) by mouth daily.   multivitamin with minerals Tabs tablet Take 1 tablet by mouth daily at 3 pm. Centrum   OVER THE COUNTER MEDICATION Take 1 tablet by mouth at bedtime as needed (sleep). [employer] PREVENTIVE NUTRITION TRI-SLEEP   polyethylene glycol 17 g packet Commonly known as:  MIRALAX / GLYCOLAX Take 17 g by mouth 2 (two) times daily as needed (constipation).   sildenafil 20 MG tablet Commonly known as:  REVATIO Take up to 5 pills about 30 min prior to sex What changed:    how much to take  how to take this  when to take this  reasons to take this  additional instructions   tretinoin 0.1 % cream Commonly known as:  RETIN-A Apply 1 application topically at bedtime.           Outstanding Labs/Studies   none  Duration of Discharge Encounter   Greater than 30 minutes including physician time.  Mable Fill, PA-C 09/14/2018, 9:26 AM [phone]

## 2018-09-14 NOTE — Progress Notes (Signed)
CARDIAC REHAB PHASE I   Pt has been walking, no c/o. Ed completed including restrictions, walking at home, and CRPII. Will refer to [place], ACSM 09/14/2018 9:37 AM

## 2018-09-14 NOTE — Discharge Instructions (Signed)
ACTIVITY AND EXERCISE °• Daily activity and exercise are an important part of your recovery. People recover at different rates depending on their general health and type of valve procedure. °• Most people recovering from TAVR feel better relatively quickly  °• No lifting, pushing, pulling more than 10 pounds (examples to avoid: groceries, vacuuming, gardening, golfing): °            - For one week with a procedure through the groin. °            - For six weeks for procedures through the chest wall or neck °NOTE: You will typically see one of our providers 7-14 days after your procedure to discuss WHEN TO RESUME the above activities.  °  °  °DRIVING °• Do not drive for until you are seen for follow up and cleared by a provider. Generally, we ask patient to not drive for 1 week after their procedure. °• If you have been told by your doctor in the past that you may not drive, you must talk with him/her before you begin driving again. °  °  °DRESSING °• Groin site: you may leave the clear dressing over the site for up to one week or until it falls off. °  °  °HYGIENE °• If you had a femoral (leg) procedure, you may take a shower when you return home. After the shower, pat the site dry. Do NOT use powder, oils or lotions in your groin area until the site has completely healed. °• If you had a chest procedure, you may shower when you return home unless specifically instructed not to by your discharging practitioner. °            - DO NOT scrub incision; pat dry with a towel °            - DO NOT apply any lotions, oils, powders to the incision °            - No tub baths / swimming for at least 2 weeks. °• If you notice any fevers, chills, increased pain, swelling, bleeding or pus, please contact your doctor. °  °ADDITIONAL INFORMATION °• If you are going to have an upcoming dental procedure, please contact our office as you will require antibiotics ahead of time to prevent infection on your heart valve.  ° ° °If you  have any questions or concerns you can call the structural heart phone during normal business hours 8am-4pm. If you have an urgent need after hours or weekends please call [phone] to talk to the on call provider for general cardiology. If you have an emergency that requires immediate attention, please call 911.  ° ° °After TAVR Checklist ° °Check  Test Description  ° Follow up appointment in 1-2 weeks  You will see our structural heart physician assistant, Katie Shamona Wirtz. Your incision sites will be checked and you will be cleared to drive and resume all normal activities if you are doing well.    ° 1 month echo and follow up  You will have an echo to check on your new heart valve and be seen back in the office by Katie Girtrude Enslin. Many times the echo is not read by your appointment time, but Katie will call you later that day or the following day to report your results.  ° Follow up with your primary cardiologist You will need to be seen by your primary cardiologist in the following 3-6 months after your 1   month appointment in the valve clinic. Often times your Plavix or Aspirin will be discontinued during this time, but this is decided on a case by case basis.   ° 1 year echo and follow up You will have another echo to check on your heart valve after 1 year and be seen back in the office by Katie Abigayl Hor. This your last structural heart visit.  ° Bacterial endocarditis prophylaxis  You will have to take antibiotics for the rest of your life before all dental procedures (even teeth cleanings) to protect your heart valve. Antibiotics are also required before some surgeries. Please check with your cardiologist before scheduling any surgeries. Also, please make sure to tell us if you have a penicillin allergy as you will require an alternative antibiotic.   ° ° ° ° °[facility]   °MULTIDISCIPLINARY HEART VALVE TEAM ° ° °YOUR CARDIOLOGY TEAM HAS ARRANGED FOR AN E-VISIT FOR YOUR APPOINTMENT - PLEASE  REVIEW IMPORTANT INFORMATION BELOW SEVERAL DAYS PRIOR TO YOUR APPOINTMENT ° °Due to the recent COVID-19 pandemic, we are transitioning in-person office visits to tele-medicine visits in an effort to decrease unnecessary exposure to our patients, their families, and staff. These visits are billed to your insurance just like a normal visit is. We also encourage you to sign up for MyChart if you have not already done so. You will need a smartphone if possible. For patients that do not have this, we can still complete the visit using a regular telephone but do prefer a smartphone to enable video when possible. You may have a family member that lives with you that can help. If possible, we also ask that you have a blood pressure cuff and scale at home to measure your blood pressure, heart rate and weight prior to your scheduled appointment. Patients with clinical needs that need an in-person evaluation and testing will still be able to come to the office if absolutely necessary. If you have any questions, feel free to call our office. ° ° ° °YOUR PROVIDER WILL BE USING THE FOLLOWING PLATFORM TO COMPLETE YOUR VISIT: [email or web address] °All you need is a smart phone. You will receive a text message from the provider through the [email or web address] app. You will follow the prompts and it will take you to a virtual visit with your provider. Please check your blood pressure, heart rate and weight prior to your scheduled appointment. ° °CONSENT FOR TELE-HEALTH VISIT - PLEASE REVIEW ° °I hereby voluntarily request, consent and authorize [facility] and its employed or contracted physicians, physician assistants, nurse practitioners or other licensed health care professionals (the Practitioner), to provide me with telemedicine health care services (the “Services") as deemed necessary by the treating Practitioner. I acknowledge and consent to receive the Services by the Practitioner via telemedicine. I understand that the telemedicine visit will  involve communicating with the Practitioner through live audiovisual communication technology and the disclosure of certain medical information by electronic transmission. I acknowledge that I have been given the opportunity to request an in-person assessment or other available alternative prior to the telemedicine visit and am voluntarily participating in the telemedicine visit. ° °I understand that I have the right to withhold or withdraw my consent to the use of telemedicine in the course of my care at any time, without affecting my right to future care or treatment, and that the Practitioner or I may terminate the telemedicine visit at any time. I understand that I have the right to inspect all information obtained   and/or recorded in the course of the telemedicine visit and may receive copies of available information for a reasonable fee.  I understand that some of the potential risks of receiving the Services via telemedicine include:  °• Delay or interruption in medical evaluation due to technological equipment failure or disruption; °• Information transmitted may not be sufficient (e.g. poor resolution of images) to allow for appropriate medical decision making by the Practitioner; and/or  °• In rare instances, security protocols could fail, causing a breach of personal health information. ° °Furthermore, I acknowledge that it is my responsibility to provide information about my medical history, conditions and care that is complete and accurate to the best of my ability. I acknowledge that Practitioner's advice, recommendations, and/or decision may be based on factors not within their control, such as incomplete or inaccurate data provided by me or distortions of diagnostic images or specimens that may result from electronic transmissions. I understand that the practice of medicine is not an exact science and that Practitioner makes no warranties or guarantees regarding treatment outcomes. I acknowledge that  I will receive a copy of this consent concurrently upon execution via email to the email address I last provided but may also request a printed copy by calling the office of [facility].   ° °I understand that my insurance will be billed for this visit.  ° °I have read or had this consent read to me. °• I understand the contents of this consent, which adequately explains the benefits and risks of the Services being provided via telemedicine.  °• I have been provided ample opportunity to ask questions regarding this consent and the Services and have had my questions answered to my satisfaction. °• I give my informed consent for the services to be provided through the use of telemedicine in my medical care ° °By participating in this telemedicine visit I agree to the above. °

## 2018-09-14 NOTE — Progress Notes (Signed)
1 Day Post-Op Procedure(s) (LRB): TRANSCATHETER AORTIC VALVE REPLACEMENT, TRANSFEMORAL (N/A) TRANSESOPHAGEAL ECHOCARDIOGRAM (TEE) (N/A) Subjective: No complaints. Ambulated without shortness of breath.  Objective: Vital signs in last 24 hours: Temp:  [97.2 F (36.2 C)-98.9 F (37.2 C)] 98.4 F (36.9 C) (06/03 0754) Pulse Rate:  [52-84] 71 (06/03 0754) Cardiac Rhythm: Normal sinus rhythm;Bundle branch block (06/03 0700) Resp:  [14-20] 15 (06/03 0754) BP: (112-168)/(60-95) 156/86 (06/03 0754) SpO2:  [94 %-100 %] 98 % (06/03 0754) FiO2 (%):  [28 %] 28 % (06/02 0945) Weight:  [87.8 kg-90.4 kg] 87.8 kg (06/03 0500)  Hemodynamic parameters for last 24 hours:    Intake/Output from previous day: 06/02 0701 - 06/03 0700 In: 1620.6 [P.O.:600; I.V.:720.6; IV Piggyback:300] Out: 550 [Urine:550] Intake/Output this shift: No intake/output data recorded.  General appearance: alert and cooperative Neurologic: intact Heart: regular rate and rhythm, S1, S2 normal, no murmur Lungs: clear to auscultation bilaterally Extremities: extremities normal, atraumatic, no cyanosis or edema Wound: groin sites look good without hematoma.  Lab Results: Recent Labs    09/13/18 1028 09/14/18 0320  WBC  --  7.4  HGB 12.2* 12.7*  HCT 36.0* 37.7*  PLT  --  147*   BMET:  Recent Labs    09/13/18 0904 09/13/18 1028 09/13/18 1034 09/14/18 0320  NA 140 140  --  135  K 4.2 4.1  --  4.4  CL  --   --   --  103  CO2  --   --   --  23  GLUCOSE 136*  --   --  121*  BUN  --   --   --  18  CREATININE  --   --  0.70 1.00  CALCIUM  --   --   --  8.6*    PT/INR: No results for input(s): LABPROT, INR in the last 72 hours. ABG    Component Value Date/Time   PHART 7.434 09/13/2018 1028   HCO3 22.3 09/13/2018 1028   TCO2 23 09/13/2018 1028   ACIDBASEDEF 1.0 09/13/2018 1028   O2SAT 99.0 09/13/2018 1028   CBG (last 3)  No results for input(s): GLUCAP in the last 72 hours.  ECG: sinus, RBBB (present  preop and unchanged)  Assessment/Plan: S/P Procedure(s) (LRB): TRANSCATHETER AORTIC VALVE REPLACEMENT, TRANSFEMORAL (N/A) TRANSESOPHAGEAL ECHOCARDIOGRAM (TEE) (N/A)  POD 1 Hemodynamically stable in sinus rhythm. Old RBBB unchanged with no signs of higher degree block on monitor.  Echo reviewed and looks good with no paravalvular leak and acceptable gradient.  Plan home today on ASA and Plavix   LOS: 1 day    Gaye Pollack 09/14/2018

## 2018-09-14 NOTE — Progress Notes (Signed)
  Echocardiogram 2D Echocardiogram has been performed.  James Cardenas 09/14/2018, 8:40 AM

## 2018-09-15 ENCOUNTER — Encounter: Payer: Self-pay | Admitting: *Deleted

## 2018-09-15 ENCOUNTER — Telehealth: Payer: Self-pay | Admitting: Physician Assistant

## 2018-09-15 NOTE — Telephone Encounter (Signed)
[place] VALVE TEAM   Patient contacted regarding discharge from [facility] on 6/3  Patient understands to follow up with provider Nell Range on 6/10 @2pm  as a virtual visit  Patient understands discharge instructions? yes Patient understands medications and regimen? yes Patient understands to bring all medications to this visit? yes  Angelena Form PA-C  [facility]    YOUR CARDIOLOGY TEAM HAS ARRANGED FOR AN E-VISIT FOR YOUR APPOINTMENT - PLEASE REVIEW IMPORTANT INFORMATION BELOW SEVERAL DAYS PRIOR TO YOUR APPOINTMENT  Due to the recent COVID-19 pandemic, we are transitioning in-person office visits to tele-medicine visits in an effort to decrease unnecessary exposure to our patients, their families, and staff. These visits are billed to your insurance just like a normal visit is. We also encourage you to sign up for MyChart if you have not already done so. You will need a smartphone if possible. For patients that do not have this, we can still complete the visit using a regular telephone but do prefer a smartphone to enable video when possible. You may have a family member that lives with you that can help. If possible, we also ask that you have a blood pressure cuff and scale at home to measure your blood pressure, heart rate and weight prior to your scheduled appointment. Patients with clinical needs that need an in-person evaluation and testing will still be able to come to the office if absolutely necessary. If you have any questions, feel free to call our office.   YOUR PROVIDER WILL BE USING THE FOLLOWING PLATFORM TO COMPLETE YOUR VISIT: [email or web address] All you need is a smart phone. You will receive a text message from the provider through the [email or web address] app. You will follow the prompts and it will take you to a virtual visit with your provider. Please check your blood pressure, heart rate and weight prior to your scheduled appointment.  CONSENT FOR  TELE-HEALTH VISIT - PLEASE REVIEW  I hereby voluntarily request, consent and authorize [facility] and its employed or contracted physicians, physician assistants, nurse practitioners or other licensed health care professionals (the Practitioner), to provide me with telemedicine health care services (the "Services") as deemed necessary by the treating Practitioner. I acknowledge and consent to receive the Services by the Practitioner via telemedicine. I understand that the telemedicine visit will involve communicating with the Practitioner through live audiovisual communication technology and the disclosure of certain medical information by electronic transmission. I acknowledge that I have been given the opportunity to request an in-person assessment or other available alternative prior to the telemedicine visit and am voluntarily participating in the telemedicine visit.  I understand that I have the right to withhold or withdraw my consent to the use of telemedicine in the course of my care at any time, without affecting my right to future care or treatment, and that the Practitioner or I may terminate the telemedicine visit at any time. I understand that I have the right to inspect all information obtained and/or recorded in the course of the telemedicine visit and may receive copies of available information for a reasonable fee.  I understand that some of the potential risks of receiving the Services via telemedicine include:  Marland Kitchen Delay or interruption in medical evaluation due to technological equipment failure or disruption; . Information transmitted may not be sufficient (e.g. poor resolution of images) to allow for appropriate medical decision making by the Practitioner; and/or  . In rare instances, security protocols could fail, causing a  breach of personal health information.  Furthermore, I acknowledge that it is my responsibility to provide information about my medical history, conditions and  care that is complete and accurate to the best of my ability. I acknowledge that Practitioner's advice, recommendations, and/or decision may be based on factors not within their control, such as incomplete or inaccurate data provided by me or distortions of diagnostic images or specimens that may result from electronic transmissions. I understand that the practice of medicine is not an exact science and that Practitioner makes no warranties or guarantees regarding treatment outcomes. I acknowledge that I will receive a copy of this consent concurrently upon execution via email to the email address I last provided but may also request a printed copy by calling the office of [place].    I understand that my insurance will be billed for this visit.   I have read or had this consent read to me. . I understand the contents of this consent, which adequately explains the benefits and risks of the Services being provided via telemedicine.  . I have been provided ample opportunity to ask questions regarding this consent and the Services and have had my questions answered to my satisfaction. . I give my informed consent for the services to be provided through the use of telemedicine in my medical care  By participating in this telemedicine visit I agree to the above.

## 2018-09-20 MED FILL — Heparin Sodium (Porcine) Inj 1000 Unit/ML: INTRAMUSCULAR | Qty: 30 | Status: AC

## 2018-09-20 MED FILL — Magnesium Sulfate Inj 50%: INTRAMUSCULAR | Qty: 10 | Status: AC

## 2018-09-20 MED FILL — Potassium Chloride Inj 2 mEq/ML: INTRAVENOUS | Qty: 40 | Status: AC

## 2018-09-20 NOTE — Progress Notes (Signed)
[facility]   MULTIDISCIPLINARY HEART VALVE TEAM     Virtual Visit via Video Note   This visit type was conducted due to national recommendations for restrictions regarding the COVID-19 Pandemic (e.g. social distancing) in an effort to limit this patient's exposure and mitigate transmission in our community.  Due to his co-morbid illnesses, this patient is at least at moderate risk for complications without adequate follow up.  This format is felt to be most appropriate for this patient at this time.  All issues noted in this document were discussed and addressed.  A limited physical exam was performed with this format.  Please refer to the patient's chart for his consent to telehealth for [facility].   Evaluation Performed:  Follow-up visit  Date:  09/21/2018   ID:  James Cardenas, DOB 17-Apr-1940, MRN [id number]  Patient Location: Home Provider Location: Office  PCP:  Olin Hauser, DO  Cardiologist: Dr. Rockey Situ / Dr. Burt Knack & Dr. Cyndia Bent (TAVR).   Chief Complaint: TOC s/p TAVR  History of Present Illness:    James Cardenas is a 78 y.o. male with a history of HTN, MGUS, pulm HTN, carotid artery disease, neuropathy with mild gait imbalance, severe AS/mod AI s/p TAVR (09/13/18) who presents for follow up.   The patient does not have symptoms concerning for COVID-19 infection (fever, chills, cough, or new shortness of breath).   He has always been very active as an adult playing tennis, exercising, and riding a bicycle but over the past few years has been slow due to development of peripheral neuropathy affecting his balance. Over the past 6 months or so he has developed exertional fatigue as well as some chest tightness. He denies any shortness of breath or orthopnea. He has had some ankle and pedal edema. He denies any dizziness or syncope. His most recent echocardiogram on 04/29/2018 showed a severely calcified aortic valve with a mean gradient of 37 mmHg and  a peak gradient of 62 mmHg. There is moderate aortic insufficiency. Left ventricular ejection fraction was 55% with moderate concentric left ventricular hypertrophy. He underwent cardiac catheterization on 07/28/2018 showing no significant coronary disease. Right heart pressures were normal.  He underwent successful TAVR with a 26 mm Edwards Sapien 3 THV via the TF approach on 09/13/2018. Post operative echo showed EF 55%, normally functioning TAVR with no PVL and mean gradient 19 mm Hg. He was discharged on aspirin and plavix.   Today he presents for follow up. He has been walking around the house and walking the dogs. No CP or SOB. No LE edema, orthopnea or PND. No dizziness or syncope. No blood in stool or urine. No palpitations. Has some mild soreness in his right groin.    Past Medical History:  Diagnosis Date  . Anxiety   . Arthritis of knee   . Carotid artery disease ([place])   . 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 Coronary artery disease   . Hearing loss    wears hearing aids  . Hypertension   . Neuropathy   . S/P TAVR (transcatheter aortic valve replacement)    26 mm Edwards Sapien THV via the TF approach   . Severe aortic stenosis    Past Surgical History:  Procedure Laterality Date  . COLONOSCOPY    . EYE SURGERY Bilateral    cataract  . HERNIA REPAIR Left    Inguinal  . NECK SURGERY  2011   Cervical Fusion  .  RIGHT HEART CATH AND CORONARY ANGIOGRAPHY N/A 07/28/2018   Procedure: RIGHT HEART CATH AND CORONARY ANGIOGRAPHY;  Surgeon: Minna Merritts, MD;  Location: [place] [facility];  Service: Cardiovascular;  Laterality: N/A;  . TEE WITHOUT CARDIOVERSION N/A 09/13/2018   Procedure: TRANSESOPHAGEAL ECHOCARDIOGRAM (TEE);  Surgeon: Sherren Mocha, MD;  Location: [place];  Service: Open Heart Surgery;  Laterality: N/A;  . TRANSCATHETER AORTIC VALVE REPLACEMENT, TRANSFEMORAL  09/13/2018  . TRANSCATHETER AORTIC VALVE REPLACEMENT, TRANSFEMORAL N/A 09/13/2018   Procedure:  TRANSCATHETER AORTIC VALVE REPLACEMENT, TRANSFEMORAL;  Surgeon: Sherren Mocha, MD;  Location: [place];  Service: Open Heart Surgery;  Laterality: N/A;     Current Meds  Medication Sig  . Alpha-Lipoic Acid 600 MG CAPS Take 600 mg by mouth every morning.   Marland Kitchen ALPRAZolam (XANAX) 0.5 MG tablet Take 1 tablet (0.5 mg total) by mouth 2 (two) times daily as needed for anxiety.  Marland Kitchen aspirin EC 81 MG tablet Take 81 mg by mouth every evening.   . Azelaic Acid 15 % cream Apply 1 application topically daily. After skin is thoroughly washed and patted dry, gently but thoroughly massage a thin film of azelaic acid cream into the affected area once daily  . Carboxymethylcellul-Glycerin (LUBRICATING EYE DROPS OP) Place 1 drop into both eyes 2 (two) times a day.   . cholecalciferol (VITAMIN D3) 25 MCG (1000 UT) tablet Take 1,000 Units by mouth daily.  . clopidogrel (PLAVIX) 75 MG tablet Take 1 tablet (75 mg total) by mouth daily with breakfast.  . Glucosamine HCl-MSM (GLUCOSAMINE-MSM PO) Take 2 tablets by mouth daily at 3 pm.   . losartan (COZAAR) 100 MG tablet Take 1 tablet (100 mg total) by mouth daily.  . mirabegron ER (MYRBETRIQ) 50 MG TB24 tablet Take 1 tablet (50 mg total) by mouth daily.  . Multiple Vitamin (MULTIVITAMIN WITH MINERALS) TABS tablet Take 1 tablet by mouth daily at 3 pm. Centrum  . Omega-3 Fatty Acids (MINI FISH OIL PO) Take 2 capsules by mouth daily at 12 noon.  Marland Kitchen OVER THE COUNTER MEDICATION Take 1 tablet by mouth at bedtime as needed (sleep). GNC PREVENTIVE NUTRITION TRI-SLEEP  . polyethylene glycol (MIRALAX / GLYCOLAX) packet Take 17 g by mouth 2 (two) times daily as needed (constipation).   . sildenafil (REVATIO) 20 MG tablet Take up to 5 pills about 30 min prior to sex (Patient taking differently: Take 100 mg by mouth daily as needed (prior to sex). )  . tretinoin (RETIN-A) 0.1 % cream Apply 1 application topically at bedtime.      Allergies:   Nickel   Social History   Tobacco Use   . Smoking status: Never Smoker  . Smokeless tobacco: Never Used  Substance Use Topics  . Alcohol use: Yes    Alcohol/week: 7.0 standard drinks    Types: 1 Cans of beer, 6 Glasses of wine per week  . Drug use: No     Family Hx: The patient's family history includes Alzheimer's disease in his sister; Lung cancer in his father; Pancreatic cancer in his brother; Thyroid disease in his mother. There is no history of Prostate cancer, Bladder Cancer, or Kidney cancer.  ROS:   Please see the history of present illness.    All other systems reviewed and are negative.   Prior CV studies:   The following studies were reviewed today:  TAVR OPERATIVE NOTE   Date of Procedure:09/13/2018  Preoperative Diagnosis:Severe Aortic Stenosis   Postoperative Diagnosis:Same   Procedure:  Transcatheter Aortic Valve Replacement - PercutaneousLeftTransfemoral Approach Edwards Sapien 3 THV (size 15mm, model # 9600TFX, serial # [id number])  Co-Surgeons:Bryan Alveria Apley, MDand Sherren Mocha, MD   Anesthesiologist:Kevin Conrad Morton, MD  Dala Dock, MD  Pre-operative Echo Findings: ? Severe aortic stenosis ? Normalleft ventricular systolic function  Post-operative Echo Findings: ? noparavalvular leak ? Normalleft ventricular systolic function  _____________   Echo 09/14/18: IMPRESSIONS 1. The left ventricle has normal systolic function, with an ejection fraction of 55%. The cavity size was normal. There is severe concentric left ventricular hypertrophy. Left ventricular diastolic function could not be evaluated due to nondiagnostic  images. No evidence of left ventricular regional wall motion abnormalities. 2. The right ventricle has normal systolic function. The cavity was normal. There is no increase in right ventricular wall thickness. 3. - TAVR: S/P 75mm  Edwards Sapien 3 TAVR with normal functioning prosthesis. There is no perivalvular AI. AV Area (VTI): 1.60 cm. AV Mean Grad: 19.0 . AV Vmax: 309.50   Labs/Other Tests and Data Reviewed:    EKG:  No ECG reviewed.  Recent Labs: 09/09/2018: ALT 39; B Natriuretic Peptide 317.3 09/14/2018: BUN 18; Creatinine, Ser 1.00; Hemoglobin 12.7; Magnesium 1.9; Platelets 147; Potassium 4.4; Sodium 135   Recent Lipid Panel Lab Results  Component Value Date/Time   CHOL 171 09/29/2016 08:48 AM   CHOL 171 11/06/2013   TRIG 40 09/29/2016 08:48 AM   TRIG 45 11/06/2013   HDL 73 09/29/2016 08:48 AM   HDL 72 11/06/2013   CHOLHDL 2.3 09/29/2016 08:48 AM   LDLCALC 90 09/29/2016 08:48 AM    Wt Readings from Last 3 Encounters:  09/21/18 188 lb 8 oz (85.5 kg)  09/14/18 193 lb 8 oz (87.8 kg)  09/09/18 199 lb 4.8 oz (90.4 kg)     Objective:    Vital Signs:  BP 119/71   Pulse 75   Ht 6\' 3"  (1.905 m)   Wt 188 lb 8 oz (85.5 kg)   BMI 23.56 kg/m    Well nourished, well developed male in no acute distress.   ASSESSMENT & PLAN:    Severe AS s/p TAVR:doing well.. Groins sites are healing well. No s/s HAVB. Continue aspirin and plavix. SBE prophylaxis discussed; I have RX'd amoxicillin.  I will see him back next month for echo and follow up.   Chronic diastolic CHF: no s/s CHF. Not on any diuretics.   HTN: BP well controlled. Continue current regimen.   Carotid artery diease: dopplers showed 1-39% RICA and 40-59% RICA stenosis. Plan to repeat dopplers in 08/2019.   Pulmonary nodules: noted on pre TAVR CT. Two tiny solid pulmonary nodules, largest 3 mm. No follow-up needed if patient is low-risk. 12 month CT if high risk. Patient never personally smoked but grew up with a lot of second hand smoke.   COVID-19 Education: The signs and symptoms of COVID-19 were discussed with the patient and how to seek care for testing (follow up with PCP or arrange E-visit).  The importance of social distancing was  discussed today.  Time:   Today, I have spent 25 minutes with the patient with telehealth technology discussing the above problems.     Medication Adjustments/Labs and Tests Ordered: Current medicines are reviewed at length with the patient today.  Concerns regarding medicines are outlined above.   Tests Ordered: No orders of the defined types were placed in this encounter.   Medication Changes: Meds ordered this encounter  Medications  . amoxicillin (AMOXIL) 500 MG tablet  Sig: Take 2,000 mg (4 pills) one hour prior to all dental visits. There are enough for two visits in this bottle.    Dispense:  8 tablet    Refill:  6    Disposition:  Follow up next month for echo and virtual visit.   Signed, Angelena Form, PA-C  09/21/2018 5:12 PM    [facility]

## 2018-09-21 ENCOUNTER — Telehealth (INDEPENDENT_AMBULATORY_CARE_PROVIDER_SITE_OTHER): Payer: Medicare Other | Admitting: Physician Assistant

## 2018-09-21 ENCOUNTER — Other Ambulatory Visit: Payer: Self-pay

## 2018-09-21 VITALS — BP 119/71 | HR 75 | Ht 75.0 in | Wt 188.5 lb

## 2018-09-21 DIAGNOSIS — I10 Essential (primary) hypertension: Secondary | ICD-10-CM

## 2018-09-21 DIAGNOSIS — Z952 Presence of prosthetic heart valve: Secondary | ICD-10-CM

## 2018-09-21 DIAGNOSIS — I779 Disorder of arteries and arterioles, unspecified: Secondary | ICD-10-CM

## 2018-09-21 DIAGNOSIS — I5032 Chronic diastolic (congestive) heart failure: Secondary | ICD-10-CM

## 2018-09-21 DIAGNOSIS — Z7189 Other specified counseling: Secondary | ICD-10-CM

## 2018-09-21 MED ORDER — AMOXICILLIN 500 MG PO TABS: ORAL_TABLET | ORAL | 6 refills | Status: DC

## 2018-09-21 NOTE — Patient Instructions (Signed)
Medication Instructions:  Your provider discussed the importance of taking an antibiotic prior to all dental visits to prevent damage to the heart valves from infection. You were given a prescription for AMOXIL 2,000 mg to take one hour prior to all dental appointments.   Follow-Up: Please keep all your scheduled appointments!

## 2018-09-27 ENCOUNTER — Encounter: Payer: Medicare Other | Attending: Cardiovascular Disease | Admitting: *Deleted

## 2018-09-27 ENCOUNTER — Other Ambulatory Visit: Payer: Self-pay

## 2018-09-27 DIAGNOSIS — Z952 Presence of prosthetic heart valve: Secondary | ICD-10-CM

## 2018-09-27 NOTE — Progress Notes (Signed)
Confirm Consent - "In the setting of the current Covid19 crisis, you are scheduled to join our "At Home" [facility]  Cardiac or Pulmonary  Rehab program . Just as we do with many in-gym visits, in order for you to participate in this program, we must obtain consent.  If you'd like, I can send this to your mychart (if signed up) or email for you to review.  Otherwise, I can obtain your verbal consent now.  By agreeing to a Cardiac or Pulmonary Rehab Telehealth visit, we'd like you to understand that the technology does not allow for your Cardiac or Pulmonary Rehab team member to perform a physical assessment, and thus may limit their ability to fully assess your ability to perform exercise programs. If your provider identifies any concerns that need to be evaluated in person, we will make arrangements to do so.  Finally, though the technology is pretty good, we cannot assure that it will always work on either your or our end and we cannot ensure that we have a secure connection.  Cardiac and Pulmonary Rehab Telehealth visits and "At Home" cardiac and pulmonary rehab are provided at no cost to you. Are you willing to proceed?" STAFF: Did the patient verbally acknowledge consent to telehealth visit? Document YES/NO here:YES   Date and Time 09/27/2018 1616                                               Staff completing consent process Sharmaine Base RN BSN CCRP   Email: [email or web address] Phone: [phone] Diagnosis: TAVR CONSENT COMPLETED: Yes  Risk Stratification:low Risk Factors:  Hypertension diastolic HF Current Exercise: walking      Up and down a hill until COVID stopped this. Has a bike and an exercise ball now at home Patient Exercise Barriers :neuropathy  Mobility Assistive Device at Home:  none  Vital Sign Devices at [facility] pressure Exercise Equipment at Home:  bike, ball for wall exercises and will doing pushups Followup appointment made: Yes To use [employer]:Yes  Entered on Dashboard  Yes  SMS sent with invite Yes   Had been active until turned 71. Neuropathy stopped a lot of his exercise.

## 2018-09-30 ENCOUNTER — Ambulatory Visit (HOSPITAL_COMMUNITY): Payer: Self-pay

## 2018-09-30 DIAGNOSIS — I350 Nonrheumatic aortic (valve) stenosis: Secondary | ICD-10-CM

## 2018-10-03 ENCOUNTER — Encounter: Payer: Medicare Other | Admitting: *Deleted

## 2018-10-03 DIAGNOSIS — Z952 Presence of prosthetic heart valve: Secondary | ICD-10-CM

## 2018-10-03 NOTE — Progress Notes (Signed)
Nutrition consultation completed

## 2018-10-04 ENCOUNTER — Encounter: Payer: Medicare Other | Admitting: *Deleted

## 2018-10-04 ENCOUNTER — Other Ambulatory Visit: Payer: Self-pay

## 2018-10-04 DIAGNOSIS — Z952 Presence of prosthetic heart valve: Secondary | ICD-10-CM

## 2018-10-04 NOTE — Progress Notes (Signed)
Starting out your exercise session should last for 30 minutes for 3-5 days a week.    Your progression for home exercise is:  Start at 30 min 3 days a week consistently.  Continue at this level for 1-2 weeks then begin to add in your 4th and 5th days.  Another alternative will be to start with one round of videos (moderate intensity) and then add in the second round to reach your 30 min.  You can also combine the videos with your walking/biking too.   Resistance Training: Start with 6-8 exercises for 12 reps each or follow along with a video. Exercises can be found in packet that will be mailed to you.  You can do them while seated on your ball.  Please continue with what you have done and just add in a few extras for alternatives.  Start with push ups on the wall, then to table, then to floor based on how you are feeling.  You can add in sit ups, but just start slowly.  Exercise at a comfortable pace, using your heart rate and rate of perceived exertion as guides for intensity.  Your target heart rate range is 102-129.

## 2018-10-04 NOTE — Progress Notes (Signed)
Cardiac Individual Treatment Plan  Patient Details  Name: James Cardenas MRN: [id number] Date of Birth: [birth year] Referring Provider:     Cardiac Rehab from 10/04/2018 in [facility] Cardiac and Pulmonary Rehab  Referring Provider  Sherren Mocha MD      Initial Encounter Date:    Cardiac Rehab from 10/04/2018 in [facility] Cardiac and Pulmonary Rehab  Date  10/04/18      Visit Diagnosis: S/P TAVR (transcatheter aortic valve replacement)  Patient's Home Medications on Admission:  Current Outpatient Medications:  .  Alpha-Lipoic Acid 600 MG CAPS, Take 600 mg by mouth every morning. , Disp: , Rfl:  .  ALPRAZolam (XANAX) 0.5 MG tablet, Take 1 tablet (0.5 mg total) by mouth 2 (two) times daily as needed for anxiety., Disp: 60 tablet, Rfl: 2 .  amoxicillin (AMOXIL) 500 MG tablet, Take 2,000 mg (4 pills) one hour prior to all dental visits. There are enough for two visits in this bottle., Disp: 8 tablet, Rfl: 6 .  aspirin EC 81 MG tablet, Take 81 mg by mouth every evening. , Disp: , Rfl:  .  Azelaic Acid 15 % cream, Apply 1 application topically daily. After skin is thoroughly washed and patted dry, gently but thoroughly massage a thin film of azelaic acid cream into the affected area once daily, Disp: , Rfl:  .  Carboxymethylcellul-Glycerin (LUBRICATING EYE DROPS OP), Place 1 drop into both eyes 2 (two) times a day. , Disp: , Rfl:  .  cholecalciferol (VITAMIN D3) 25 MCG (1000 UT) tablet, Take 1,000 Units by mouth daily., Disp: , Rfl:  .  clopidogrel (PLAVIX) 75 MG tablet, Take 1 tablet (75 mg total) by mouth daily with breakfast., Disp: 90 tablet, Rfl: 1 .  Glucosamine HCl-MSM (GLUCOSAMINE-MSM PO), Take 2 tablets by mouth daily at 3 pm. , Disp: , Rfl:  .  losartan (COZAAR) 100 MG tablet, Take 1 tablet (100 mg total) by mouth daily., Disp: 90 tablet, Rfl: 1 .  mirabegron ER (MYRBETRIQ) 50 MG TB24 tablet, Take 1 tablet (50 mg total) by mouth daily., Disp: 90 tablet, Rfl: 3 .  Multiple Vitamin  (MULTIVITAMIN WITH MINERALS) TABS tablet, Take 1 tablet by mouth daily at 3 pm. Centrum, Disp: , Rfl:  .  Omega-3 Fatty Acids (MINI FISH OIL PO), Take 2 capsules by mouth daily at 12 noon., Disp: , Rfl:  .  OVER THE COUNTER MEDICATION, Take 1 tablet by mouth at bedtime as needed (sleep). GNC PREVENTIVE NUTRITION TRI-SLEEP, Disp: , Rfl:  .  polyethylene glycol (MIRALAX / GLYCOLAX) packet, Take 17 g by mouth 2 (two) times daily as needed (constipation). , Disp: , Rfl:  .  sildenafil (REVATIO) 20 MG tablet, Take up to 5 pills about 30 min prior to sex (Patient taking differently: Take 100 mg by mouth daily as needed (prior to sex). ), Disp: 50 tablet, Rfl: 8 .  tretinoin (RETIN-A) 0.1 % cream, Apply 1 application topically at bedtime. , Disp: , Rfl: 0  Past Medical History: Past Medical History:  Diagnosis Date  . Anxiety   . Arthritis of knee   . Carotid artery disease ([place])   . 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 Coronary artery disease   . Hearing loss    wears hearing aids  . Hypertension   . Neuropathy   . S/P TAVR (transcatheter aortic valve replacement)    26 mm Edwards Sapien THV via the TF approach   . Severe aortic stenosis  Tobacco Use: Social History   Tobacco Use  Smoking Status Never Smoker  Smokeless Tobacco Never Used    Labs: Recent Review Flowsheet Data    Labs for ITP Cardiac and Pulmonary Rehab Latest Ref Rng & Units 11/09/2015 09/29/2016 10/12/2017 09/09/2018 09/13/2018   Cholestrol <200 mg/dL - 171 - - -   LDLCALC <100 mg/dL - 90 - - -   HDL >40 mg/dL - 73 - - -   Trlycerides <150 mg/dL - 40 - - -   Hemoglobin A1c 4.8 - 5.6 % 5.4 5.4 5.5 5.5 -   PHART 7.350 - 7.450 - - - 7.468(H) 7.434   PCO2ART 32.0 - 48.0 mmHg - - - 33.8 33.3   HCO3 20.0 - 28.0 mmol/L - - - 24.2 22.3   TCO2 22 - 32 mmol/L - - - - 23   ACIDBASEDEF 0.0 - 2.0 mmol/L - - - - 1.0   O2SAT % - - - 98.6 99.0       Exercise Target Goals: Exercise Program Goal: Individual exercise  prescription set using results from initial 6 min walk test and THRR while considering  patient's activity barriers and safety.   Exercise Prescription Goal: Initial exercise prescription builds to 30-45 minutes a day of aerobic activity, 2-3 days per week.  Home exercise guidelines will be given to patient during program as part of exercise prescription that the participant will acknowledge.  Activity Barriers & Risk Stratification: Activity Barriers & Cardiac Risk Stratification - 10/04/18 1124      Activity Barriers & Cardiac Risk Stratification   Activity Barriers  Muscular Weakness;Balance Concerns;Other (comment)    Comments  neuropathy    Cardiac Risk Stratification  Low       6 Minute Walk:   Oxygen Initial Assessment:   Oxygen Re-Evaluation:   Oxygen Discharge (Final Oxygen Re-Evaluation):   Initial Exercise Prescription: Initial Exercise Prescription - 10/04/18 1100      Date of Initial Exercise RX and Referring Provider   Date  10/04/18    Referring Provider  Sherren Mocha MD      Track   Minutes  30      Prescription Details   Frequency (times per week)  3    Duration  Progress to 30 minutes of continuous aerobic without signs/symptoms of physical distress      Intensity   THRR 40-80% of Max Heartrate  102-129    Ratings of Perceived Exertion  11-13    Perceived Dyspnea  0-4      Progression   Progression  Continue to progress workloads to maintain intensity without signs/symptoms of physical distress.      Resistance Training   Training Prescription  Yes    Weight  ROM/Body Weight    Reps  10-15       Perform Capillary Blood Glucose checks as needed.  Exercise Prescription Changes:   Exercise Comments:   Exercise Goals and Review: Exercise Goals    Row Name 10/04/18 1132             Exercise Goals   Increase Physical Activity  Yes       Intervention  Provide advice, education, support and counseling about physical  activity/exercise needs.;Develop an individualized exercise prescription for aerobic and resistive training based on initial evaluation findings, risk stratification, comorbidities and participant's personal goals.       Expected Outcomes  Short Term: Attend rehab on a regular basis to increase amount of physical activity.;Long Term: Add  in home exercise to make exercise part of routine and to increase amount of physical activity.;Long Term: Exercising regularly at least 3-5 days a week.       Increase Strength and Stamina  Yes       Intervention  Provide advice, education, support and counseling about physical activity/exercise needs.;Develop an individualized exercise prescription for aerobic and resistive training based on initial evaluation findings, risk stratification, comorbidities and participant's personal goals.       Expected Outcomes  Short Term: Increase workloads from initial exercise prescription for resistance, speed, and METs.;Short Term: Perform resistance training exercises routinely during rehab and add in resistance training at home;Long Term: Improve cardiorespiratory fitness, muscular endurance and strength as measured by increased METs and functional capacity (6MWT)       Able to understand and use rate of perceived exertion (RPE) scale  Yes       Intervention  Provide education and explanation on how to use RPE scale       Expected Outcomes  Short Term: Able to use RPE daily in rehab to express subjective intensity level;Long Term:  Able to use RPE to guide intensity level when exercising independently       Able to understand and use Dyspnea scale  Yes       Intervention  Provide education and explanation on how to use Dyspnea scale       Expected Outcomes  Short Term: Able to use Dyspnea scale daily in rehab to express subjective sense of shortness of breath during exertion;Long Term: Able to use Dyspnea scale to guide intensity level when exercising independently        Knowledge and understanding of Target Heart Rate Range (THRR)  Yes       Intervention  Provide education and explanation of THRR including how the numbers were predicted and where they are located for reference       Expected Outcomes  Short Term: Able to state/look up THRR;Short Term: Able to use daily as guideline for intensity in rehab;Long Term: Able to use THRR to govern intensity when exercising independently       Able to check pulse independently  Yes       Intervention  Provide education and demonstration on how to check pulse in carotid and radial arteries.;Review the importance of being able to check your own pulse for safety during independent exercise       Expected Outcomes  Short Term: Able to explain why pulse checking is important during independent exercise;Long Term: Able to check pulse independently and accurately       Understanding of Exercise Prescription  Yes       Intervention  Provide education, explanation, and written materials on patient's individual exercise prescription       Expected Outcomes  Short Term: Able to explain program exercise prescription;Long Term: Able to explain home exercise prescription to exercise independently          Exercise Goals Re-Evaluation :   Discharge Exercise Prescription (Final Exercise Prescription Changes):   Nutrition:  Target Goals: Understanding of nutrition guidelines, daily intake of sodium <1518m, cholesterol <2042m calories 30% from fat and 7% or less from saturated fats, daily to have 5 or more servings of fruits and vegetables.  Biometrics:    Nutrition Therapy Plan and Nutrition Goals: Nutrition Therapy & Goals - 10/03/18 1200      Nutrition Therapy   Diet  HH, low Na    Protein (specify units)  70g  Fiber  25 grams    Whole Grain Foods  3 servings    Saturated Fats  12 max. grams    Fruits and Vegetables  5 servings/day    Sodium  1.5 grams      Personal Nutrition Goals   Nutrition Goal  ST: continue  HH eating, learn more about HH eating LT: maintian weight    Comments  Used to be very disciplined 5 years ago when his medical problems started. Tries to eat reasonably healthy and nowuses butter/olive oil combination, now eats whole wheat and whole grains, will have eggs with bacon 2x/week, banana with cereal (rice crispy and corn flakes together but sometimes will have raisin bran) and frozen fruit (like berries) x3x/week and will have applesauce on toast instead of jelly. Will normally eat B and D. Has a jar of peanuts for a snack and will have cuties or halos every once in a while. D: will grill steak every once in a while, chicken breast, frozen veggies (steams) broccoli, brussell sprouts, sweet potatoes every once in a while, corn (not much). (meat or poultry with vegetable) once and a while will have fish (bake or broil) gets at [employer] but hasn't been over there with COVID and  doesn't use mayo uses low calorie miricle whip with tuna and toast. Will have some frozen pizza or fish sticks every once in a while. will have baked beans (don't like other beans). Doesn't fry much, tries to grill it, bake it, or broil it. will have graham crackers for a snack or some angel food cake and frozen yogurt. will has some mild constipation due to bladder pills. weight 185-189lbs, focus on goal to 182-184lbs - weighs regularly. Will use olive oil for most things, will eat greek salads and chef salads (regular dressings) and gets them out. Disscussed MyPlate and HH eating, answered all questions (corn, peas, whole wheat, gluten). Water consumption low, but will mix water with other things.      Intervention Plan   Intervention  Prescribe, educate and counsel regarding individualized specific dietary modifications aiming towards targeted core components such as weight, hypertension, lipid management, diabetes, heart failure and other comorbidities.;Nutrition handout(s) given to patient.    Expected Outcomes  Short  Term Goal: A plan has been developed with personal nutrition goals set during dietitian appointment.;Long Term Goal: Adherence to prescribed nutrition plan.;Short Term Goal: Understand basic principles of dietary content, such as calories, fat, sodium, cholesterol and nutrients.       Nutrition Assessments:   Nutrition Goals Re-Evaluation:   Nutrition Goals Discharge (Final Nutrition Goals Re-Evaluation):   Psychosocial: Target Goals: Acknowledge presence or absence of significant depression and/or stress, maximize coping skills, provide positive support system. Participant is able to verbalize types and ability to use techniques and skills needed for reducing stress and depression.   Initial Review & Psychosocial Screening:   Quality of Life Scores:   Scores of 19 and below usually indicate a poorer quality of life in these areas.  A difference of  2-3 points is a clinically meaningful difference.  A difference of 2-3 points in the total score of the Quality of Life Index has been associated with significant improvement in overall quality of life, self-image, physical symptoms, and general health in studies assessing change in quality of life.  PHQ-9: Recent Review Flowsheet Data    Depression screen [facility] 2/9 04/19/2018 04/19/2018 11/23/2017 10/12/2017 04/16/2017   Decreased Interest 0 0 0 0 0   Down, Depressed,  Hopeless 0 0 0 0 0   PHQ - 2 Score 0 0 0 0 0   Altered sleeping 0 - - - 0   Tired, decreased energy 0 - - - 0   Change in appetite 0 - - - 0   Feeling bad or failure about yourself  0 - - - 0   Trouble concentrating 0 - - - 0   Moving slowly or fidgety/restless 0 - - - 0   Suicidal thoughts 0 - - - 0   PHQ-9 Score 0 - - - 0   Difficult doing work/chores Not difficult at all - - - Not difficult at all     Interpretation of Total Score  Total Score Depression Severity:  1-4 = Minimal depression, 5-9 = Mild depression, 10-14 = Moderate depression, 15-19 = Moderately severe  depression, 20-27 = Severe depression   Psychosocial Evaluation and Intervention:   Psychosocial Re-Evaluation:   Psychosocial Discharge (Final Psychosocial Re-Evaluation):   Vocational Rehabilitation: Provide vocational rehab assistance to qualifying candidates.   Vocational Rehab Evaluation & Intervention:   Education: Education Goals: Education classes will be provided on a variety of topics geared toward better understanding of heart health and risk factor modification. Participant will state understanding/return demonstration of topics presented as noted by education test scores.  Learning Barriers/Preferences:   Education Topics:  AED/CPR: - Group verbal and written instruction with the use of models to demonstrate the basic use of the AED with the basic ABC's of resuscitation.   General Nutrition Guidelines/Fats and Fiber: -Group instruction provided by verbal, written material, models and posters to present the general guidelines for heart healthy nutrition. Gives an explanation and review of dietary fats and fiber.   Controlling Sodium/Reading Food Labels: -Group verbal and written material supporting the discussion of sodium use in heart healthy nutrition. Review and explanation with models, verbal and written materials for utilization of the food label.   Exercise Physiology & General Exercise Guidelines: - Group verbal and written instruction with models to review the exercise physiology of the cardiovascular system and associated critical values. Provides general exercise guidelines with specific guidelines to those with heart or lung disease.    Aerobic Exercise & Resistance Training: - Gives group verbal and written instruction on the various components of exercise. Focuses on aerobic and resistive training programs and the benefits of this training and how to safely progress through these programs..   Flexibility, Balance, Mind/Body Relaxation: Provides  group verbal/written instruction on the benefits of flexibility and balance training, including mind/body exercise modes such as yoga, pilates and tai chi.  Demonstration and skill practice provided.   Stress and Anxiety: - Provides group verbal and written instruction about the health risks of elevated stress and causes of high stress.  Discuss the correlation between heart/lung disease and anxiety and treatment options. Review healthy ways to manage with stress and anxiety.   Depression: - Provides group verbal and written instruction on the correlation between heart/lung disease and depressed mood, treatment options, and the stigmas associated with seeking treatment.   Anatomy & Physiology of the Heart: - Group verbal and written instruction and models provide basic cardiac anatomy and physiology, with the coronary electrical and arterial systems. Review of Valvular disease and Heart Failure   Cardiac Procedures: - Group verbal and written instruction to review commonly prescribed medications for heart disease. Reviews the medication, class of the drug, and side effects. Includes the steps to properly store meds and maintain the prescription  regimen. (beta blockers and nitrates)   Cardiac Medications I: - Group verbal and written instruction to review commonly prescribed medications for heart disease. Reviews the medication, class of the drug, and side effects. Includes the steps to properly store meds and maintain the prescription regimen.   Cardiac Medications II: -Group verbal and written instruction to review commonly prescribed medications for heart disease. Reviews the medication, class of the drug, and side effects. (all other drug classes)    Go Sex-Intimacy & Heart Disease, Get SMART - Goal Setting: - Group verbal and written instruction through game format to discuss heart disease and the return to sexual intimacy. Provides group verbal and written material to discuss and  apply goal setting through the application of the S.M.A.R.T. Method.   Other Matters of the Heart: - Provides group verbal, written materials and models to describe Stable Angina and Peripheral Artery. Includes description of the disease process and treatment options available to the cardiac patient.   Exercise & Equipment Safety: - Individual verbal instruction and demonstration of equipment use and safety with use of the equipment.   Infection Prevention: - Provides verbal and written material to individual with discussion of infection control including proper hand washing and proper equipment cleaning during exercise session.   Falls Prevention: - Provides verbal and written material to individual with discussion of falls prevention and safety.   Diabetes: - Individual verbal and written instruction to review signs/symptoms of diabetes, desired ranges of glucose level fasting, after meals and with exercise. Acknowledge that pre and post exercise glucose checks will be done for 3 sessions at entry of program.   Know Your Numbers and Risk Factors: -Group verbal and written instruction about important numbers in your health.  Discussion of what are risk factors and how they play a role in the disease process.  Review of Cholesterol, Blood Pressure, Diabetes, and BMI and the role they play in your overall health.   Sleep Hygiene: -Provides group verbal and written instruction about how sleep can affect your health.  Define sleep hygiene, discuss sleep cycles and impact of sleep habits. Review good sleep hygiene tips.    Other: -Provides group and verbal instruction on various topics (see comments)   Knowledge Questionnaire Score:   Core Components/Risk Factors/Patient Goals at Admission: Personal Goals and Risk Factors at Admission - 10/04/18 1118      Core Components/Risk Factors/Patient Goals on Admission    Weight Management  Yes;Weight Maintenance    Intervention  Weight  Management: Develop a combined nutrition and exercise program designed to reach desired caloric intake, while maintaining appropriate intake of nutrient and fiber, sodium and fats, and appropriate energy expenditure required for the weight goal.;Weight Management: Provide education and appropriate resources to help participant work on and attain dietary goals.    Expected Outcomes  Short Term: Continue to assess and modify interventions until short term weight is achieved;Long Term: Adherence to nutrition and physical activity/exercise program aimed toward attainment of established weight goal;Weight Maintenance: Understanding of the daily nutrition guidelines, which includes 25-35% calories from fat, 7% or less cal from saturated fats, less than 237m cholesterol, less than 1.5gm of sodium, & 5 or more servings of fruits and vegetables daily    Heart Failure  Yes    Intervention  Provide a combined exercise and nutrition program that is supplemented with education, support and counseling about heart failure. Directed toward relieving symptoms such as shortness of breath, decreased exercise tolerance, and extremity edema.  Expected Outcomes  Improve functional capacity of life;Short term: Attendance in program 2-3 days a week with increased exercise capacity. Reported lower sodium intake. Reported increased fruit and vegetable intake. Reports medication compliance.;Short term: Daily weights obtained and reported for increase. Utilizing diuretic protocols set by physician.;Long term: Adoption of self-care skills and reduction of barriers for early signs and symptoms recognition and intervention leading to self-care maintenance.    Hypertension  Yes    Intervention  Provide education on lifestyle modifcations including regular physical activity/exercise, weight management, moderate sodium restriction and increased consumption of fresh fruit, vegetables, and low fat dairy, alcohol moderation, and smoking  cessation.;Monitor prescription use compliance.    Expected Outcomes  Short Term: Continued assessment and intervention until BP is < 140/18m HG in hypertensive participants. < 130/866mHG in hypertensive participants with diabetes, heart failure or chronic kidney disease.;Long Term: Maintenance of blood pressure at goal levels.       Core Components/Risk Factors/Patient Goals Review:    Core Components/Risk Factors/Patient Goals at Discharge (Final Review):    ITP Comments: ITP Comments    Row Name 09/27/18 1610 10/03/18 1259         ITP Comments  Virtual Cardiac Rehab visit completed. Consent and Intake completed.  Appointments made for EP and RD  nutrition consult completed         Comments: Initial Virtual ExRx

## 2018-10-13 ENCOUNTER — Other Ambulatory Visit: Payer: Self-pay | Admitting: Family Medicine

## 2018-10-13 DIAGNOSIS — I10 Essential (primary) hypertension: Secondary | ICD-10-CM

## 2018-10-17 ENCOUNTER — Telehealth: Payer: Self-pay

## 2018-10-17 NOTE — Telephone Encounter (Signed)

## 2018-10-18 ENCOUNTER — Other Ambulatory Visit: Payer: Self-pay

## 2018-10-18 ENCOUNTER — Encounter: Payer: Medicare Other | Attending: Surgery | Admitting: *Deleted

## 2018-10-18 ENCOUNTER — Ambulatory Visit (INDEPENDENT_AMBULATORY_CARE_PROVIDER_SITE_OTHER): Payer: Medicare Other

## 2018-10-18 VITALS — Ht 74.2 in | Wt 193.3 lb

## 2018-10-18 DIAGNOSIS — Z952 Presence of prosthetic heart valve: Secondary | ICD-10-CM

## 2018-10-18 NOTE — Patient Instructions (Signed)
Patient Instructions  Patient Details  Name: James Cardenas MRN: [id number] Date of Birth: 1940-09-26 Referring Provider:  Gaye Pollack, MD  Below are your personal goals for exercise, nutrition, and risk factors. Our goal is to help you stay on track towards obtaining and maintaining these goals. We will be discussing your progress on these goals with you throughout the program.  Initial Exercise Prescription: Initial Exercise Prescription - 10/18/18 1500      Date of Initial Exercise RX and Referring Provider   Date  10/18/18    Referring Provider  Sherren Mocha MD      Treadmill   MPH  2.5    Grade  1    Minutes  15    METs  3.26      Recumbant Bike   Level  2    RPM  50    Watts  36    Minutes  15    METs  3.2      NuStep   Level  3    SPM  80    Minutes  15    METs  3.2      REL-XR   Level  2    Speed  50    Minutes  15    METs  3.2      T5 Nustep   Level  3    SPM  80    Minutes  15    METs  3.2      Prescription Details   Frequency (times per week)  3    Duration  Progress to 30 minutes of continuous aerobic without signs/symptoms of physical distress      Intensity   THRR 40-80% of Max Heartrate  94-127    Ratings of Perceived Exertion  11-13    Perceived Dyspnea  0-4      Progression   Progression  Continue to progress workloads to maintain intensity without signs/symptoms of physical distress.      Resistance Training   Training Prescription  Yes    Weight  4 lbs    Reps  10-15       Exercise Goals: Frequency: Be able to perform aerobic exercise two to three times per week in program working toward 2-5 days per week of home exercise.  Intensity: Work with a perceived exertion of 11 (fairly light) - 15 (hard) while following your exercise prescription.  We will make changes to your prescription with you as you progress through the program.   Duration: Be able to do 30 to 45 minutes of continuous aerobic exercise in addition to a 5  minute warm-up and a 5 minute cool-down routine.   Nutrition Goals: Your personal nutrition goals will be established when you do your nutrition analysis with the dietician.  The following are general nutrition guidelines to follow: Cholesterol < 200mg /day Sodium < 1500mg /day Fiber: Men over 50 yrs - 30 grams per day  Personal Goals: Personal Goals and Risk Factors at Admission - 10/18/18 1510      Core Components/Risk Factors/Patient Goals on Admission    Weight Management  Yes;Weight Maintenance    Intervention  Weight Management: Develop a combined nutrition and exercise program designed to reach desired caloric intake, while maintaining appropriate intake of nutrient and fiber, sodium and fats, and appropriate energy expenditure required for the weight goal.;Weight Management: Provide education and appropriate resources to help participant work on and attain dietary goals.    Admit Weight  193  lb 4.8 oz (87.7 kg)    Goal Weight: Short Term  193 lb (87.5 kg)    Goal Weight: Long Term  193 lb (87.5 kg)    Expected Outcomes  Short Term: Continue to assess and modify interventions until short term weight is achieved;Long Term: Adherence to nutrition and physical activity/exercise program aimed toward attainment of established weight goal;Weight Maintenance: Understanding of the daily nutrition guidelines, which includes 25-35% calories from fat, 7% or less cal from saturated fats, less than 200mg  cholesterol, less than 1.5gm of sodium, & 5 or more servings of fruits and vegetables daily    Heart Failure  Yes    Intervention  Provide a combined exercise and nutrition program that is supplemented with education, support and counseling about heart failure. Directed toward relieving symptoms such as shortness of breath, decreased exercise tolerance, and extremity edema.    Expected Outcomes  Improve functional capacity of life;Short term: Attendance in program 2-3 days a week with increased  exercise capacity. Reported lower sodium intake. Reported increased fruit and vegetable intake. Reports medication compliance.;Short term: Daily weights obtained and reported for increase. Utilizing diuretic protocols set by physician.;Long term: Adoption of self-care skills and reduction of barriers for early signs and symptoms recognition and intervention leading to self-care maintenance.    Hypertension  Yes    Intervention  Provide education on lifestyle modifcations including regular physical activity/exercise, weight management, moderate sodium restriction and increased consumption of fresh fruit, vegetables, and low fat dairy, alcohol moderation, and smoking cessation.;Monitor prescription use compliance.    Expected Outcomes  Short Term: Continued assessment and intervention until BP is < 140/102mm HG in hypertensive participants. < 130/32mm HG in hypertensive participants with diabetes, heart failure or chronic kidney disease.;Long Term: Maintenance of blood pressure at goal levels.       Tobacco Use Initial Evaluation: Social History   Tobacco Use  Smoking Status Never Smoker  Smokeless Tobacco Never Used    Exercise Goals and Review: Exercise Goals    Row Name 10/04/18 1132             Exercise Goals   Increase Physical Activity  Yes       Intervention  Provide advice, education, support and counseling about physical activity/exercise needs.;Develop an individualized exercise prescription for aerobic and resistive training based on initial evaluation findings, risk stratification, comorbidities and participant's personal goals.       Expected Outcomes  Short Term: Attend rehab on a regular basis to increase amount of physical activity.;Long Term: Add in home exercise to make exercise part of routine and to increase amount of physical activity.;Long Term: Exercising regularly at least 3-5 days a week.       Increase Strength and Stamina  Yes       Intervention  Provide advice,  education, support and counseling about physical activity/exercise needs.;Develop an individualized exercise prescription for aerobic and resistive training based on initial evaluation findings, risk stratification, comorbidities and participant's personal goals.       Expected Outcomes  Short Term: Increase workloads from initial exercise prescription for resistance, speed, and METs.;Short Term: Perform resistance training exercises routinely during rehab and add in resistance training at home;Long Term: Improve cardiorespiratory fitness, muscular endurance and strength as measured by increased METs and functional capacity (6MWT)       Able to understand and use rate of perceived exertion (RPE) scale  Yes       Intervention  Provide education and explanation on how to use  RPE scale       Expected Outcomes  Short Term: Able to use RPE daily in rehab to express subjective intensity level;Long Term:  Able to use RPE to guide intensity level when exercising independently       Able to understand and use Dyspnea scale  Yes       Intervention  Provide education and explanation on how to use Dyspnea scale       Expected Outcomes  Short Term: Able to use Dyspnea scale daily in rehab to express subjective sense of shortness of breath during exertion;Long Term: Able to use Dyspnea scale to guide intensity level when exercising independently       Knowledge and understanding of Target Heart Rate Range (THRR)  Yes       Intervention  Provide education and explanation of THRR including how the numbers were predicted and where they are located for reference       Expected Outcomes  Short Term: Able to state/look up THRR;Short Term: Able to use daily as guideline for intensity in rehab;Long Term: Able to use THRR to govern intensity when exercising independently       Able to check pulse independently  Yes       Intervention  Provide education and demonstration on how to check pulse in carotid and radial  arteries.;Review the importance of being able to check your own pulse for safety during independent exercise       Expected Outcomes  Short Term: Able to explain why pulse checking is important during independent exercise;Long Term: Able to check pulse independently and accurately       Understanding of Exercise Prescription  Yes       Intervention  Provide education, explanation, and written materials on patient's individual exercise prescription       Expected Outcomes  Short Term: Able to explain program exercise prescription;Long Term: Able to explain home exercise prescription to exercise independently          Copy of goals given to participant.

## 2018-10-18 NOTE — Progress Notes (Addendum)
Cardiac Individual Treatment Plan  Patient Details  Name: James Cardenas MRN: [id number] Date of Birth: 03-26-1941 Referring Provider:     Cardiac Rehab from 10/18/2018 in [facility] Cardiac and Pulmonary Rehab  Referring Provider  Sherren Mocha MD      Initial Encounter Date:    Cardiac Rehab from 10/18/2018 in [facility] Cardiac and Pulmonary Rehab  Date  10/18/18      Visit Diagnosis: S/P TAVR (transcatheter aortic valve replacement)   Patient's Home Medications on Admission:  Current Outpatient Medications:  .  Alpha-Lipoic Acid 600 MG CAPS, Take 600 mg by mouth every morning. , Disp: , Rfl:  .  ALPRAZolam (XANAX) 0.5 MG tablet, Take 1 tablet (0.5 mg total) by mouth 2 (two) times daily as needed for anxiety., Disp: 60 tablet, Rfl: 2 .  amoxicillin (AMOXIL) 500 MG tablet, Take 2,000 mg (4 pills) one hour prior to all dental visits. There are enough for two visits in this bottle., Disp: 8 tablet, Rfl: 6 .  aspirin EC 81 MG tablet, Take 81 mg by mouth every evening. , Disp: , Rfl:  .  Azelaic Acid 15 % cream, Apply 1 application topically daily. After skin is thoroughly washed and patted dry, gently but thoroughly massage a thin film of azelaic acid cream into the affected area once daily, Disp: , Rfl:  .  Carboxymethylcellul-Glycerin (LUBRICATING EYE DROPS OP), Place 1 drop into both eyes 2 (two) times a day. , Disp: , Rfl:  .  cholecalciferol (VITAMIN D3) 25 MCG (1000 UT) tablet, Take 1,000 Units by mouth daily., Disp: , Rfl:  .  clopidogrel (PLAVIX) 75 MG tablet, Take 1 tablet (75 mg total) by mouth daily with breakfast., Disp: 90 tablet, Rfl: 1 .  Glucosamine HCl-MSM (GLUCOSAMINE-MSM PO), Take 2 tablets by mouth daily at 3 pm. , Disp: , Rfl:  .  mirabegron ER (MYRBETRIQ) 50 MG TB24 tablet, Take 1 tablet (50 mg total) by mouth daily., Disp: 90 tablet, Rfl: 3 .  Multiple Vitamin (MULTIVITAMIN WITH MINERALS) TABS tablet, Take 1 tablet by mouth daily at 3 pm. Centrum, Disp: , Rfl:  .  Omega-3  Fatty Acids (MINI FISH OIL PO), Take 2 capsules by mouth daily at 12 noon., Disp: , Rfl:  .  OVER THE COUNTER MEDICATION, Take 1 tablet by mouth at bedtime as needed (sleep). GNC PREVENTIVE NUTRITION TRI-SLEEP, Disp: , Rfl:  .  polyethylene glycol (MIRALAX / GLYCOLAX) packet, Take 17 g by mouth 2 (two) times daily as needed (constipation). , Disp: , Rfl:  .  sildenafil (REVATIO) 20 MG tablet, Take up to 5 pills about 30 min prior to sex (Patient taking differently: Take 100 mg by mouth daily as needed (prior to sex). ), Disp: 50 tablet, Rfl: 8 .  telmisartan (MICARDIS) 80 MG tablet, Take 1 tablet (80 mg total) by mouth daily., Disp: 90 tablet, Rfl: 1 .  tretinoin (RETIN-A) 0.1 % cream, Apply 1 application topically at bedtime. , Disp: , Rfl: 0  Past Medical History: Past Medical History:  Diagnosis Date  . Anxiety   . Arthritis of knee   . Carotid artery disease ([place])   . 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 Coronary artery disease   . Hearing loss    wears hearing aids  . Hypertension   . Neuropathy   . S/P TAVR (transcatheter aortic valve replacement)    26 mm Edwards Sapien THV via the TF approach   . Severe aortic stenosis  Tobacco Use: Social History   Tobacco Use  Smoking Status Never Smoker  Smokeless Tobacco Never Used    Labs: Recent Review Flowsheet Data    Labs for ITP Cardiac and Pulmonary Rehab Latest Ref Rng & Units 11/09/2015 09/29/2016 10/12/2017 09/09/2018 09/13/2018   Cholestrol <200 mg/dL - 171 - - -   LDLCALC <100 mg/dL - 90 - - -   HDL >40 mg/dL - 73 - - -   Trlycerides <150 mg/dL - 40 - - -   Hemoglobin A1c 4.8 - 5.6 % 5.4 5.4 5.5 5.5 -   PHART 7.350 - 7.450 - - - 7.468(H) 7.434   PCO2ART 32.0 - 48.0 mmHg - - - 33.8 33.3   HCO3 20.0 - 28.0 mmol/L - - - 24.2 22.3   TCO2 22 - 32 mmol/L - - - - 23   ACIDBASEDEF 0.0 - 2.0 mmol/L - - - - 1.0   O2SAT % - - - 98.6 99.0       Exercise Target Goals: Exercise Program Goal: Individual exercise  prescription set using results from initial 6 min walk test and THRR while considering  patient's activity barriers and safety.   Exercise Prescription Goal: Initial exercise prescription builds to 30-45 minutes a day of aerobic activity, 2-3 days per week.  Home exercise guidelines will be given to patient during program as part of exercise prescription that the participant will acknowledge.  Activity Barriers & Risk Stratification: Activity Barriers & Cardiac Risk Stratification - 10/18/18 1507      Activity Barriers & Cardiac Risk Stratification   Activity Barriers  Deconditioning;Muscular Weakness;Other (comment)    Comments  neuropathy    Cardiac Risk Stratification  Low       6 Minute Walk: 6 Minute Walk    Row Name 10/18/18 1647         6 Minute Walk   Phase  Initial     Distance  1402 feet     Walk Time  6 minutes     # of Rest Breaks  0     MPH  2.66     METS  3.29     RPE  9     Perceived Dyspnea   1     VO2 Peak  11.53     Symptoms  No     Resting HR  61 bpm     Resting BP  134/72     Resting Oxygen Saturation   97 %     Exercise Oxygen Saturation  during 6 min walk  98 %     Max Ex. HR  109 bpm     Max Ex. BP  156/66     2 Minute Post BP  134/72 Recheck 134/70        Oxygen Initial Assessment:   Oxygen Re-Evaluation:   Oxygen Discharge (Final Oxygen Re-Evaluation):   Initial Exercise Prescription: Initial Exercise Prescription - 10/18/18 1500      Date of Initial Exercise RX and Referring Provider   Date  10/18/18    Referring Provider  Sherren Mocha MD      Treadmill   MPH  2.5    Grade  1    Minutes  15    METs  3.26      Recumbant Bike   Level  2    RPM  50    Watts  36    Minutes  15    METs  3.2  NuStep   Level  3    SPM  80    Minutes  15    METs  3.2      REL-XR   Level  2    Speed  50    Minutes  15    METs  3.2      T5 Nustep   Level  3    SPM  80    Minutes  15    METs  3.2      Prescription Details    Frequency (times per week)  3    Duration  Progress to 30 minutes of continuous aerobic without signs/symptoms of physical distress      Intensity   THRR 40-80% of Max Heartrate  94-127    Ratings of Perceived Exertion  11-13    Perceived Dyspnea  0-4      Progression   Progression  Continue to progress workloads to maintain intensity without signs/symptoms of physical distress.      Resistance Training   Training Prescription  Yes    Weight  4 lbs    Reps  10-15       Perform Capillary Blood Glucose checks as needed.  Exercise Prescription Changes:   Exercise Comments:   Exercise Goals and Review:  Exercise Goals    Row Name 10/04/18 1132             Exercise Goals   Increase Physical Activity  Yes       Intervention  Provide advice, education, support and counseling about physical activity/exercise needs.;Develop an individualized exercise prescription for aerobic and resistive training based on initial evaluation findings, risk stratification, comorbidities and participant's personal goals.       Expected Outcomes  Short Term: Attend rehab on a regular basis to increase amount of physical activity.;Long Term: Add in home exercise to make exercise part of routine and to increase amount of physical activity.;Long Term: Exercising regularly at least 3-5 days a week.       Increase Strength and Stamina  Yes       Intervention  Provide advice, education, support and counseling about physical activity/exercise needs.;Develop an individualized exercise prescription for aerobic and resistive training based on initial evaluation findings, risk stratification, comorbidities and participant's personal goals.       Expected Outcomes  Short Term: Increase workloads from initial exercise prescription for resistance, speed, and METs.;Short Term: Perform resistance training exercises routinely during rehab and add in resistance training at home;Long Term: Improve cardiorespiratory  fitness, muscular endurance and strength as measured by increased METs and functional capacity (6MWT)       Able to understand and use rate of perceived exertion (RPE) scale  Yes       Intervention  Provide education and explanation on how to use RPE scale       Expected Outcomes  Short Term: Able to use RPE daily in rehab to express subjective intensity level;Long Term:  Able to use RPE to guide intensity level when exercising independently       Able to understand and use Dyspnea scale  Yes       Intervention  Provide education and explanation on how to use Dyspnea scale       Expected Outcomes  Short Term: Able to use Dyspnea scale daily in rehab to express subjective sense of shortness of breath during exertion;Long Term: Able to use Dyspnea scale to guide intensity level when exercising independently  Knowledge and understanding of Target Heart Rate Range (THRR)  Yes       Intervention  Provide education and explanation of THRR including how the numbers were predicted and where they are located for reference       Expected Outcomes  Short Term: Able to state/look up THRR;Short Term: Able to use daily as guideline for intensity in rehab;Long Term: Able to use THRR to govern intensity when exercising independently       Able to check pulse independently  Yes       Intervention  Provide education and demonstration on how to check pulse in carotid and radial arteries.;Review the importance of being able to check your own pulse for safety during independent exercise       Expected Outcomes  Short Term: Able to explain why pulse checking is important during independent exercise;Long Term: Able to check pulse independently and accurately       Understanding of Exercise Prescription  Yes       Intervention  Provide education, explanation, and written materials on patient's individual exercise prescription       Expected Outcomes  Short Term: Able to explain program exercise prescription;Long Term:  Able to explain home exercise prescription to exercise independently          Exercise Goals Re-Evaluation :   Discharge Exercise Prescription (Final Exercise Prescription Changes):   Nutrition:  Target Goals: Understanding of nutrition guidelines, daily intake of sodium <1538m, cholesterol <2036m calories 30% from fat and 7% or less from saturated fats, daily to have 5 or more servings of fruits and vegetables.  Biometrics: Pre Biometrics - 10/18/18 1510      Pre Biometrics   Height  6' 2.2" (1.885 m)    Weight  193 lb 4.8 oz (87.7 kg)    BMI (Calculated)  24.68        Nutrition Therapy Plan and Nutrition Goals: Nutrition Therapy & Goals - 10/03/18 1200      Nutrition Therapy   Diet  HH, low Na    Protein (specify units)  70g    Fiber  25 grams    Whole Grain Foods  3 servings    Saturated Fats  12 max. grams    Fruits and Vegetables  5 servings/day    Sodium  1.5 grams      Personal Nutrition Goals   Nutrition Goal  ST: continue HH eating, learn more about HH eating LT: maintian weight    Comments  Used to be very disciplined 5 years ago when his medical problems started. Tries to eat reasonably healthy and nowuses butter/olive oil combination, now eats whole wheat and whole grains, will have eggs with bacon 2x/week, banana with cereal (rice crispy and corn flakes together but sometimes will have raisin bran) and frozen fruit (like berries) x3x/week and will have applesauce on toast instead of jelly. Will normally eat B and D. Has a jar of peanuts for a snack and will have cuties or halos every once in a while. D: will grill steak every once in a while, chicken breast, frozen veggies (steams) broccoli, brussell sprouts, sweet potatoes every once in a while, corn (not much). (meat or poultry with vegetable) once and a while will have fish (bake or broil) [place] [facility] [place] hasn't been over there with COVID and  doesn't use mayo uses low calorie miricle whip with tuna  and toast. Will have some frozen pizza or fish sticks every once in a  while. will have baked beans (don't like other beans). Doesn't fry much, tries to grill it, bake it, or broil it. will have graham crackers for a snack or some angel food cake and frozen yogurt. will has some mild constipation due to bladder pills. weight 185-189lbs, focus on goal to 182-184lbs - weighs regularly. Will use olive oil for most things, will eat greek salads and chef salads (regular dressings) and gets them out. Disscussed MyPlate and HH eating, answered all questions (corn, peas, whole wheat, gluten). Water consumption low, but will mix water with other things.      Intervention Plan   Intervention  Prescribe, educate and counsel regarding individualized specific dietary modifications aiming towards targeted core components such as weight, hypertension, lipid management, diabetes, heart failure and other comorbidities.;Nutrition handout(s) given to patient.    Expected Outcomes  Short Term Goal: A plan has been developed with personal nutrition goals set during dietitian appointment.;Long Term Goal: Adherence to prescribed nutrition plan.;Short Term Goal: Understand basic principles of dietary content, such as calories, fat, sodium, cholesterol and nutrients.       Nutrition Assessments: Nutrition Assessments - 10/18/18 1657      MEDFICTS Scores   Pre Score  21       Nutrition Goals Re-Evaluation:   Nutrition Goals Discharge (Final Nutrition Goals Re-Evaluation):   Psychosocial: Target Goals: Acknowledge presence or absence of significant depression and/or stress, maximize coping skills, provide positive support system. Participant is able to verbalize types and ability to use techniques and skills needed for reducing stress and depression.   Initial Review & Psychosocial Screening:   Quality of Life Scores:  Quality of Life - 10/18/18 1658      Quality of Life   Select  Quality of Life      Quality  of Life Scores   Health/Function Pre  24.03 %    Socioeconomic Pre  23.43 %    Psych/Spiritual Pre  26.14 %    Family Pre  27.6 %    GLOBAL Pre  24.82 %      Scores of 19 and below usually indicate a poorer quality of life in these areas.  A difference of  2-3 points is a clinically meaningful difference.  A difference of 2-3 points in the total score of the Quality of Life Index has been associated with significant improvement in overall quality of life, self-image, physical symptoms, and general health in studies assessing change in quality of life.  PHQ-9: Recent Review Flowsheet Data    Depression screen [facility] 2/9 04/19/2018 04/19/2018 11/23/2017 10/12/2017 04/16/2017   Decreased Interest 0 0 0 0 0   Down, Depressed, Hopeless 0 0 0 0 0   PHQ - 2 Score 0 0 0 0 0   Altered sleeping 0 - - - 0   Tired, decreased energy 0 - - - 0   Change in appetite 0 - - - 0   Feeling bad or failure about yourself  0 - - - 0   Trouble concentrating 0 - - - 0   Moving slowly or fidgety/restless 0 - - - 0   Suicidal thoughts 0 - - - 0   PHQ-9 Score 0 - - - 0   Difficult doing work/chores Not difficult at all - - - Not difficult at all     Interpretation of Total Score  Total Score Depression Severity:  1-4 = Minimal depression, 5-9 = Mild depression, 10-14 = Moderate depression, 15-19 = Moderately severe  depression, 20-27 = Severe depression   Psychosocial Evaluation and Intervention:   Psychosocial Re-Evaluation:   Psychosocial Discharge (Final Psychosocial Re-Evaluation):   Vocational Rehabilitation: Provide vocational rehab assistance to qualifying candidates.   Vocational Rehab Evaluation & Intervention:   Education: Education Goals: Education classes will be provided on a variety of topics geared toward better understanding of heart health and risk factor modification. Participant will state understanding/return demonstration of topics presented as noted by education test scores.  Learning  Barriers/Preferences:   Education Topics:  AED/CPR: - Group verbal and written instruction with the use of models to demonstrate the basic use of the AED with the basic ABC's of resuscitation.   General Nutrition Guidelines/Fats and Fiber: -Group instruction provided by verbal, written material, models and posters to present the general guidelines for heart healthy nutrition. Gives an explanation and review of dietary fats and fiber.   Controlling Sodium/Reading Food Labels: -Group verbal and written material supporting the discussion of sodium use in heart healthy nutrition. Review and explanation with models, verbal and written materials for utilization of the food label.   Exercise Physiology & General Exercise Guidelines: - Group verbal and written instruction with models to review the exercise physiology of the cardiovascular system and associated critical values. Provides general exercise guidelines with specific guidelines to those with heart or lung disease.    Aerobic Exercise & Resistance Training: - Gives group verbal and written instruction on the various components of exercise. Focuses on aerobic and resistive training programs and the benefits of this training and how to safely progress through these programs..   Flexibility, Balance, Mind/Body Relaxation: Provides group verbal/written instruction on the benefits of flexibility and balance training, including mind/body exercise modes such as yoga, pilates and tai chi.  Demonstration and skill practice provided.   Stress and Anxiety: - Provides group verbal and written instruction about the health risks of elevated stress and causes of high stress.  Discuss the correlation between heart/lung disease and anxiety and treatment options. Review healthy ways to manage with stress and anxiety.   Depression: - Provides group verbal and written instruction on the correlation between heart/lung disease and depressed mood,  treatment options, and the stigmas associated with seeking treatment.   Anatomy & Physiology of the Heart: - Group verbal and written instruction and models provide basic cardiac anatomy and physiology, with the coronary electrical and arterial systems. Review of Valvular disease and Heart Failure   Cardiac Procedures: - Group verbal and written instruction to review commonly prescribed medications for heart disease. Reviews the medication, class of the drug, and side effects. Includes the steps to properly store meds and maintain the prescription regimen. (beta blockers and nitrates)   Cardiac Medications I: - Group verbal and written instruction to review commonly prescribed medications for heart disease. Reviews the medication, class of the drug, and side effects. Includes the steps to properly store meds and maintain the prescription regimen.   Cardiac Medications II: -Group verbal and written instruction to review commonly prescribed medications for heart disease. Reviews the medication, class of the drug, and side effects. (all other drug classes)    Go Sex-Intimacy & Heart Disease, Get SMART - Goal Setting: - Group verbal and written instruction through game format to discuss heart disease and the return to sexual intimacy. Provides group verbal and written material to discuss and apply goal setting through the application of the S.M.A.R.T. Method.   Other Matters of the Heart: - Provides group verbal, written materials and models  to describe Stable Angina and Peripheral Artery. Includes description of the disease process and treatment options available to the cardiac patient.   Exercise & Equipment Safety: - Individual verbal instruction and demonstration of equipment use and safety with use of the equipment.   Cardiac Rehab from 10/18/2018 in [facility] Cardiac and Pulmonary Rehab  Date  10/18/18  Educator  [facility]  Instruction Review Code  1- Verbalizes Understanding      Infection  Prevention: - Provides verbal and written material to individual with discussion of infection control including proper hand washing and proper equipment cleaning during exercise session.   Cardiac Rehab from 10/18/2018 in [facility] Cardiac and Pulmonary Rehab  Date  10/18/18  Educator  [facility]  Instruction Review Code  1- Verbalizes Understanding      Falls Prevention: - Provides verbal and written material to individual with discussion of falls prevention and safety.   Cardiac Rehab from 10/18/2018 in [facility] Cardiac and Pulmonary Rehab  Date  10/18/18  Educator  [facility]  Instruction Review Code  1- Verbalizes Understanding      Diabetes: - Individual verbal and written instruction to review signs/symptoms of diabetes, desired ranges of glucose level fasting, after meals and with exercise. Acknowledge that pre and post exercise glucose checks will be done for 3 sessions at entry of program.   Know Your Numbers and Risk Factors: -Group verbal and written instruction about important numbers in your health.  Discussion of what are risk factors and how they play a role in the disease process.  Review of Cholesterol, Blood Pressure, Diabetes, and BMI and the role they play in your overall health.   Sleep Hygiene: -Provides group verbal and written instruction about how sleep can affect your health.  Define sleep hygiene, discuss sleep cycles and impact of sleep habits. Review good sleep hygiene tips.    Other: -Provides group and verbal instruction on various topics (see comments)   Knowledge Questionnaire Score: Knowledge Questionnaire Score - 10/18/18 1657      Knowledge Questionnaire Score   Pre Score  24/26   Education Focus: Exercise and Nutrition      Core Components/Risk Factors/Patient Goals at Admission: Personal Goals and Risk Factors at Admission - 10/18/18 1510      Core Components/Risk Factors/Patient Goals on Admission    Weight Management  Yes;Weight Maintenance    Intervention   Weight Management: Develop a combined nutrition and exercise program designed to reach desired caloric intake, while maintaining appropriate intake of nutrient and fiber, sodium and fats, and appropriate energy expenditure required for the weight goal.;Weight Management: Provide education and appropriate resources to help participant work on and attain dietary goals.    Admit Weight  193 lb 4.8 oz (87.7 kg)    Goal Weight: Short Term  193 lb (87.5 kg)    Goal Weight: Long Term  193 lb (87.5 kg)    Expected Outcomes  Short Term: Continue to assess and modify interventions until short term weight is achieved;Long Term: Adherence to nutrition and physical activity/exercise program aimed toward attainment of established weight goal;Weight Maintenance: Understanding of the daily nutrition guidelines, which includes 25-35% calories from fat, 7% or less cal from saturated fats, less than 274m cholesterol, less than 1.5gm of sodium, & 5 or more servings of fruits and vegetables daily    Heart Failure  Yes    Intervention  Provide a combined exercise and nutrition program that is supplemented with education, support and counseling about heart failure. Directed toward relieving  symptoms such as shortness of breath, decreased exercise tolerance, and extremity edema.    Expected Outcomes  Improve functional capacity of life;Short term: Attendance in program 2-3 days a week with increased exercise capacity. Reported lower sodium intake. Reported increased fruit and vegetable intake. Reports medication compliance.;Short term: Daily weights obtained and reported for increase. Utilizing diuretic protocols set by physician.;Long term: Adoption of self-care skills and reduction of barriers for early signs and symptoms recognition and intervention leading to self-care maintenance.    Hypertension  Yes    Intervention  Provide education on lifestyle modifcations including regular physical activity/exercise, weight management,  moderate sodium restriction and increased consumption of fresh fruit, vegetables, and low fat dairy, alcohol moderation, and smoking cessation.;Monitor prescription use compliance.    Expected Outcomes  Short Term: Continued assessment and intervention until BP is < 140/67m HG in hypertensive participants. < 130/816mHG in hypertensive participants with diabetes, heart failure or chronic kidney disease.;Long Term: Maintenance of blood pressure at goal levels.       Core Components/Risk Factors/Patient Goals Review:    Core Components/Risk Factors/Patient Goals at Discharge (Final Review):    ITP Comments: ITP Comments    Row Name 09/27/18 1610 10/03/18 1259 10/18/18 1506       ITP Comments  Virtual Cardiac Rehab visit completed. Consent and Intake completed.  Appointments made for EP and RD  nutrition consult completed  Completed intial 6MWT and completed intake.  [facility]  ITP created and sent for review to medical director, Dr. MaEmily Filbert       Comments: Initial ITP

## 2018-10-19 ENCOUNTER — Telehealth: Payer: Medicare Other | Admitting: Physician Assistant

## 2018-10-19 DIAGNOSIS — L230 Allergic contact dermatitis due to metals: Secondary | ICD-10-CM | POA: Diagnosis not present

## 2018-10-19 DIAGNOSIS — D2261 Melanocytic nevi of right upper limb, including shoulder: Secondary | ICD-10-CM | POA: Diagnosis not present

## 2018-10-19 DIAGNOSIS — X32XXXA Exposure to sunlight, initial encounter: Secondary | ICD-10-CM | POA: Diagnosis not present

## 2018-10-19 DIAGNOSIS — D2262 Melanocytic nevi of left upper limb, including shoulder: Secondary | ICD-10-CM | POA: Diagnosis not present

## 2018-10-19 DIAGNOSIS — Z85828 Personal history of other malignant neoplasm of skin: Secondary | ICD-10-CM | POA: Diagnosis not present

## 2018-10-19 DIAGNOSIS — D2271 Melanocytic nevi of right lower limb, including hip: Secondary | ICD-10-CM | POA: Diagnosis not present

## 2018-10-19 DIAGNOSIS — L570 Actinic keratosis: Secondary | ICD-10-CM | POA: Diagnosis not present

## 2018-10-19 DIAGNOSIS — D2272 Melanocytic nevi of left lower limb, including hip: Secondary | ICD-10-CM | POA: Diagnosis not present

## 2018-10-19 DIAGNOSIS — L821 Other seborrheic keratosis: Secondary | ICD-10-CM | POA: Diagnosis not present

## 2018-10-19 DIAGNOSIS — Z08 Encounter for follow-up examination after completed treatment for malignant neoplasm: Secondary | ICD-10-CM | POA: Diagnosis not present

## 2018-10-19 DIAGNOSIS — D225 Melanocytic nevi of trunk: Secondary | ICD-10-CM | POA: Diagnosis not present

## 2018-10-20 ENCOUNTER — Encounter: Payer: Self-pay | Admitting: Physician Assistant

## 2018-10-20 ENCOUNTER — Other Ambulatory Visit: Payer: Self-pay

## 2018-10-20 ENCOUNTER — Telehealth (INDEPENDENT_AMBULATORY_CARE_PROVIDER_SITE_OTHER): Payer: Medicare Other | Admitting: Physician Assistant

## 2018-10-20 ENCOUNTER — Telehealth: Payer: Medicare Other | Admitting: Physician Assistant

## 2018-10-20 VITALS — BP 106/62 | HR 68 | Ht 74.5 in | Wt 188.0 lb

## 2018-10-20 DIAGNOSIS — Z952 Presence of prosthetic heart valve: Secondary | ICD-10-CM

## 2018-10-20 DIAGNOSIS — I10 Essential (primary) hypertension: Secondary | ICD-10-CM

## 2018-10-20 DIAGNOSIS — I5032 Chronic diastolic (congestive) heart failure: Secondary | ICD-10-CM

## 2018-10-20 DIAGNOSIS — I779 Disorder of arteries and arterioles, unspecified: Secondary | ICD-10-CM

## 2018-10-20 DIAGNOSIS — R918 Other nonspecific abnormal finding of lung field: Secondary | ICD-10-CM

## 2018-10-20 MED ORDER — ROSUVASTATIN CALCIUM 10 MG PO TABS: 10.0000 mg | ORAL_TABLET | Freq: Every day | ORAL | 3 refills | Status: DC

## 2018-10-20 NOTE — Patient Instructions (Addendum)
Medication Instructions:  1) You may STOP PLAVIX 03/15/2019 2) START CRESTOR 10 mg daily  Testing/Procedures: Your physician has requested that you have a carotid duplex in May, 2021. This test is an ultrasound of the carotid arteries in your neck. It looks at blood flow through these arteries that supply the brain with blood. Allow one hour for this exam. There are no restrictions or special instructions. This appointment will be arranged the same day as your 1 year TAVR visits. We will confirm once scheduled.   Follow-Up: Your provider wants you to follow-up in: 3 months with Dr. Rockey Situ. You will receive a reminder letter in the mail two months in advance. If you don't receive a letter, please call our office to schedule the follow-up appointment.    You are scheduled for your 1 year TAVR echocardiogram and subsequent office visit with Nell Range on September 14, 2019. Please arrive at 12:45PM for these appointments.

## 2018-10-20 NOTE — Progress Notes (Signed)
Daily Session Note  Patient Details  Name: BODE PIEPER MRN: [id number] Date of Birth: 11-06-1940 Referring Provider:     Cardiac Rehab from 10/18/2018 in [facility] Cardiac [facility]  Referring Provider  Sherren Mocha MD      Encounter Date: 10/20/2018  Check In:      Social History   Tobacco Use  Smoking Status Never Smoker  Smokeless Tobacco Never Used    Goals Met:  Exercise tolerated well Personal goals reviewed No report of cardiac concerns or symptoms Strength training completed today  Goals Unmet:  Not Applicable  Comments: Marland KitchenMarland KitchenFirst full day of exercise!  Patient was oriented to gym and equipment including functions, settings, policies, and procedures.  Patient's individual exercise prescription and treatment plan were reviewed.  All starting workloads were established based on the results of the 6 minute walk test done at initial orientation visit.  The plan for exercise progression was also introduced and progression will be customized based on patient's performance and goals.   Dr. Emily Filbert is Medical Director for [facility].

## 2018-10-20 NOTE — Progress Notes (Signed)
HEART AND VASCULAR CENTER   MULTIDISCIPLINARY HEART VALVE TEAM   Virtual Visit via Video Note   This visit type was conducted due to national recommendations for restrictions regarding the COVID-19 Pandemic (e.g. social distancing) in an effort to limit this patient's exposure and mitigate transmission in our community.  Due to his co-morbid illnesses, this patient is at least at moderate risk for complications without adequate follow up.  This format is felt to be most appropriate for this patient at this time.  All issues noted in this document were discussed and addressed.  A limited physical exam was performed with this format.  Please refer to the patient's chart for his consent to telehealth for [facility].   Evaluation Performed:  Follow-up visit  Date:  10/20/2018   ID:  James Cardenas, DOB [birth year], MRN [id number]  Patient Location: Home Provider Location: Office  PCP:  Olin Hauser, DO  Cardiologist: Dr. Rockey Situ / Dr. Burt Knack & Dr. Cyndia Bent (TAVR).   Chief Complaint:  1 month s/p TAVR  History of Present Illness:    James Cardenas is a 78 y.o. male with a history of HTN, MGUS, pulm HTN, carotid artery disease, neuropathy with mild gait imbalance, severe AS/mod AIs/p TAVR (09/13/18) who presents for follow up.   The patient does not have symptoms concerning for COVID-19 infection (fever, chills, cough, or new shortness of breath).   He has always been very active as an adult playing tennis, exercising, and riding a bicycle but over the past few years has been slow due to development of peripheral neuropathy affecting his balance. Over the past 6 months or so he has developed exertional fatigue as well as some chest tightness. He denies any shortness of breath or orthopnea. He has had some ankle and pedal edema. He denies any dizziness or syncope. His most recent echocardiogram on 04/29/2018 showed a severely calcified aortic valve with a mean gradient of 37 mmHg  and a peak gradient of 62 mmHg. There is moderate aortic insufficiency. Left ventricular ejection fraction was 55% with moderate concentric left ventricular hypertrophy. He underwent cardiac catheterization on 07/28/2018 showing no significant coronary disease. Right heart pressures were normal.  He underwent successful TAVR with a50mm Edwards Sapien 3 THV via the TF approach on 09/13/2018. Post operative echoshowed EF 55%, normally functioning TAVR with no PVL and mean gradient 19 mm Hg. He was discharged on aspirin and plavix.   He has done well. Echo 10/18/18 showed EF 55-60%, mean gradient 17 mmHG (previously 19 mm Hg) and no PVL.  Today he presents for follow up.No CP or SOB. No LE edema, orthopnea or PND. No dizziness or syncope. No blood in stool or urine. No palpitations.  Doing great. Working with cardiac rehab. Staying very active.    Past Medical History:  Diagnosis Date  . Anxiety   . Arthritis of knee   . Carotid artery disease ([place])   . 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 Coronary artery disease   . Hearing loss    wears hearing aids  . Hypertension   . Neuropathy   . S/P TAVR (transcatheter aortic valve replacement)    26 mm Edwards Sapien THV via the TF approach   . Severe aortic stenosis    Past Surgical History:  Procedure Laterality Date  . COLONOSCOPY    . EYE SURGERY Bilateral    cataract  . HERNIA REPAIR Left    Inguinal  . NECK  SURGERY  2011   Cervical Fusion  . RIGHT HEART CATH AND CORONARY ANGIOGRAPHY N/A 07/28/2018   Procedure: RIGHT HEART CATH AND CORONARY ANGIOGRAPHY;  Surgeon: Minna Merritts, MD;  Location: [facility];  Service: Cardiovascular;  Laterality: N/A;  . TEE WITHOUT CARDIOVERSION N/A 09/13/2018   Procedure: TRANSESOPHAGEAL ECHOCARDIOGRAM (TEE);  Surgeon: Sherren Mocha, MD;  Location: [facility];  Service: Open Heart Surgery;  Laterality: N/A;  . TRANSCATHETER AORTIC VALVE REPLACEMENT, TRANSFEMORAL  09/13/2018  . TRANSCATHETER  AORTIC VALVE REPLACEMENT, TRANSFEMORAL N/A 09/13/2018   Procedure: TRANSCATHETER AORTIC VALVE REPLACEMENT, TRANSFEMORAL;  Surgeon: Sherren Mocha, MD;  Location: [facility];  Service: Open Heart Surgery;  Laterality: N/A;     Current Meds  Medication Sig  . Alpha-Lipoic Acid 600 MG CAPS Take 600 mg by mouth every morning.   Marland Kitchen ALPRAZolam (XANAX) 0.5 MG tablet Take 1 tablet (0.5 mg total) by mouth 2 (two) times daily as needed for anxiety.  Marland Kitchen amoxicillin (AMOXIL) 500 MG tablet Take 2,000 mg (4 pills) one hour prior to all dental visits. There are enough for two visits in this bottle.  Marland Kitchen aspirin EC 81 MG tablet Take 81 mg by mouth every evening.   . Azelaic Acid 15 % cream Apply 1 application topically daily. After skin is thoroughly washed and patted dry, gently but thoroughly massage a thin film of azelaic acid cream into the affected area once daily  . Carboxymethylcellul-Glycerin (LUBRICATING EYE DROPS OP) Place 1 drop into both eyes 2 (two) times a day.   . cholecalciferol (VITAMIN D3) 25 MCG (1000 UT) tablet Take 1,000 Units by mouth daily.  . clopidogrel (PLAVIX) 75 MG tablet Take 1 tablet (75 mg total) by mouth daily with breakfast.  . mirabegron ER (MYRBETRIQ) 50 MG TB24 tablet Take 1 tablet (50 mg total) by mouth daily.  . Multiple Vitamin (MULTIVITAMIN WITH MINERALS) TABS tablet Take 1 tablet by mouth daily at 3 pm. Centrum  . Omega-3 Fatty Acids (MINI FISH OIL PO) Take 2 capsules by mouth daily at 12 noon.  Marland Kitchen OVER THE COUNTER MEDICATION Take 1 tablet by mouth at bedtime as needed (sleep). GNC PREVENTIVE NUTRITION TRI-SLEEP  . polyethylene glycol (MIRALAX / GLYCOLAX) packet Take 17 g by mouth 2 (two) times daily as needed (constipation).   . sildenafil (REVATIO) 20 MG tablet Take up to 5 pills about 30 min prior to sex  . telmisartan (MICARDIS) 80 MG tablet Take 1 tablet (80 mg total) by mouth daily.  Marland Kitchen tretinoin (RETIN-A) 0.1 % cream Apply 1 application topically at bedtime.   .  [DISCONTINUED] Glucosamine HCl-MSM (GLUCOSAMINE-MSM PO) Take 2 tablets by mouth daily at 3 pm.      Allergies:   Nickel   Social History   Tobacco Use  . Smoking status: Never Smoker  . Smokeless tobacco: Never Used  Substance Use Topics  . Alcohol use: Yes    Alcohol/week: 7.0 standard drinks    Types: 1 Cans of beer, 6 Glasses of wine per week  . Drug use: No     Family Hx: The patient's family history includes Alzheimer's disease in his sister; Lung cancer in his father; Pancreatic cancer in his brother; Thyroid disease in his mother. There is no history of Prostate cancer, Bladder Cancer, or Kidney cancer.  ROS:   Please see the history of present illness.    All other systems reviewed and are negative.   Prior CV studies:   The following studies were reviewed today:  TAVR OPERATIVE NOTE   Date of Procedure:09/13/2018  Preoperative Diagnosis:Severe Aortic Stenosis   Postoperative Diagnosis:Same   Procedure:   Transcatheter Aortic Valve Replacement - PercutaneousLeftTransfemoral Approach Edwards Sapien 3 THV (size 14mm, model # 9600TFX, serial # [id number])  Co-Surgeons:Bryan Alveria Apley, MDand Sherren Mocha, MD   Anesthesiologist:Kevin Conrad [place], MD  Dala Dock, MD  Pre-operative Echo Findings: ? Severe aortic stenosis ? Normalleft ventricular systolic function  Post-operative Echo Findings: ? noparavalvular leak ? Normalleft ventricular systolic function  _____________  Echo 09/14/18: IMPRESSIONS 1. The left ventricle has normal systolic function, with an ejection fraction of 55%. The cavity size was normal. There is severe concentric left ventricular hypertrophy. Left ventricular diastolic function could not be evaluated due to nondiagnostic  images. No evidence of left ventricular regional wall motion abnormalities.  2. The right ventricle has normal systolic function. The cavity was normal. There is no increase in right ventricular wall thickness. 3. - TAVR: S/P 43mm Edwards Sapien 3 TAVR with normal functioning prosthesis. There is no perivalvular AI. AV Area (VTI): 1.60 cm. AV Mean Grad: 19.0 . AV Vmax: 309.50  _____________  10/18/18 IMPRESSIONS  1. The left ventricle has normal systolic function, with an ejection fraction of 55-60%. The cavity size was normal. There is moderately increased left ventricular wall thickness. Left ventricular diastolic Doppler parameters are consistent with  pseudonormalization. Elevated mean left atrial pressure No evidence of left ventricular regional wall motion abnormalities.  2. The right ventricle has normal systolic function. The cavity was normal. There is no increase in right ventricular wall thickness. Right ventricular systolic pressure is normal with an estimated pressure of 20.1 mmHg.  3. Left atrial size was mildly dilated.  4. Right atrial size was mildly dilated.  5. A 26 Edwards Sapien bioprosthetic aortic valve (TAVR) valve is present in the aortic position. Procedure Date: 09/13/18 Normal aortic valve prosthesis. Echo findings shows no evidence of regurgitation or perivalvular leak of the aortic prosthesis. Mean  gradient is mildly elevated but similar to prior echo from 09/14/2018.   Labs/Other Tests and Data Reviewed:    EKG:  No ECG reviewed.  Recent Labs: 09/09/2018: ALT 39; B Natriuretic Peptide 317.3 09/14/2018: BUN 18; Creatinine, Ser 1.00; Hemoglobin 12.7; Magnesium 1.9; Platelets 147; Potassium 4.4; Sodium 135   Recent Lipid Panel Lab Results  Component Value Date/Time   CHOL 171 09/29/2016 08:48 AM   CHOL 171 11/06/2013   TRIG 40 09/29/2016 08:48 AM   TRIG 45 11/06/2013   HDL 73 09/29/2016 08:48 AM   HDL 72 11/06/2013   CHOLHDL 2.3 09/29/2016 08:48 AM   LDLCALC 90 09/29/2016 08:48 AM    Wt Readings from Last 3 Encounters:  10/20/18  188 lb (85.3 kg)  10/18/18 193 lb 4.8 oz (87.7 kg)  09/21/18 188 lb 8 oz (85.5 kg)     Objective:    Vital Signs:  BP 106/62   Pulse 68   Ht 6' 2.5" (1.892 m)   Wt 188 lb (85.3 kg)   BMI 23.81 kg/m    Well nourished, well developed male in no acute distress.   ASSESSMENT & PLAN:    Severe AS s/p TAVR: 1 month echo showed EF 55-60%, mean gradient 17 mmHG (previously 19 mm Hg) and no PVL. He has NYHA class I symptoms. SBE prophylaxis discussed; he has amoxicillin. Plavix can be discontinued after 6 months of therapy 03/15/19. I will see him back in 1 year for follow and echo.   Chronic diastolic CHF:no s/s  CHF. Not on any diuretics.   HTN: BP well controlled. Continue current regimen.   Carotid artery diease: dopplers showed 1-39% RICA and 41-03% LICA stenosis. Plan to repeat dopplers in 08/2019. Continue medical therapy. He is not on a statin. Will start Crestor 10mg  daily.   Pulmonary nodules: noted on pre TAVR CT.Two tiny solid pulmonary nodules, largest 3 mm. No follow-up needed if patient is low-risk. 12 month CT if high risk.Patient never personally smoked, I think he is likely low risk and have recommended no further follow up. Patient agrees.   COVID-19 Education: The signs and symptoms of COVID-19 were discussed with the patient and how to seek care for testing (follow up with PCP or arrange E-visit).  The importance of social distancing was discussed today.  Time:   Today, I have spent 30 minutes with the patient with telehealth technology discussing the above problems.     Medication Adjustments/Labs and Tests Ordered: Current medicines are reviewed at length with the patient today.  Concerns regarding medicines are outlined above.   Tests Ordered: Orders Placed This Encounter  Procedures  . ECHOCARDIOGRAM COMPLETE    Medication Changes: Meds ordered this encounter  Medications  . rosuvastatin (CRESTOR) 10 MG tablet    Sig: Take 1 tablet (10 mg total) by  mouth daily.    Dispense:  90 tablet    Refill:  3    Disposition:  Follow up in 1 year(s)  Signed, Angelena Form, PA-C  10/20/2018 3:19 PM    [facility]

## 2018-10-24 ENCOUNTER — Other Ambulatory Visit: Payer: Self-pay

## 2018-10-24 DIAGNOSIS — Z952 Presence of prosthetic heart valve: Secondary | ICD-10-CM | POA: Diagnosis not present

## 2018-10-24 NOTE — Progress Notes (Signed)
Daily Session Note  Patient Details  Name: James Cardenas MRN: [id number] Date of Birth: 1940/11/09 Referring Provider:     Cardiac Rehab from 10/18/2018 in [facility] Cardiac and Pulmonary Rehab  Referring Provider  Sherren Mocha MD      Encounter Date: 10/24/2018  Check In: Session Check In - 10/24/18 1443      Check-In   Supervising physician immediately available to respond to emergencies  See telemetry face sheet for immediately available ER MD    Location  [facility]-Cardiac & Pulmonary Rehab    Staff Present  Justin Mend RCP,RRT,BSRT;Heath Lark, RN, BSN, [employer], BS, ACSM CEP, Exercise Physiologist    Virtual Visit  No    Medication changes reported      Yes    Comments  Started taking a statin this week.    Fall or balance concerns reported     No    Warm-up and Cool-down  Performed on first and last piece of equipment    Resistance Training Performed  Yes    VAD Patient?  No    PAD/SET Patient?  No      Pain Assessment   Currently in Pain?  No/denies    Multiple Pain Sites  No          Social History   Tobacco Use  Smoking Status Never Smoker  Smokeless Tobacco Never Used    Goals Met:  Independence with exercise equipment Exercise tolerated well No report of cardiac concerns or symptoms Strength training completed today  Goals Unmet:  Not Applicable  Comments: Pt able to follow exercise prescription today without complaint.  Will continue to monitor for progression.    Dr. Emily Filbert is Medical Director for [facility].

## 2018-10-26 ENCOUNTER — Encounter: Payer: Medicare Other | Admitting: *Deleted

## 2018-10-26 ENCOUNTER — Encounter: Payer: Self-pay | Admitting: *Deleted

## 2018-10-26 ENCOUNTER — Other Ambulatory Visit: Payer: Self-pay

## 2018-10-26 DIAGNOSIS — Z952 Presence of prosthetic heart valve: Secondary | ICD-10-CM

## 2018-10-26 NOTE — Progress Notes (Signed)
Cardiac Individual Treatment Plan  Patient Details  Name: James Cardenas MRN: [id number] Date of Birth: 07-12-1940 Referring Provider:     Cardiac Rehab from 10/18/2018 in [facility] Cardiac and Pulmonary Rehab  Referring Provider  Sherren Mocha MD      Initial Encounter Date:    Cardiac Rehab from 10/18/2018 in [facility] Cardiac and Pulmonary Rehab  Date  10/18/18      Visit Diagnosis: S/P TAVR (transcatheter aortic valve replacement)  Patient's Home Medications on Admission:  Current Outpatient Medications:  .  Alpha-Lipoic Acid 600 MG CAPS, Take 600 mg by mouth every morning. , Disp: , Rfl:  .  ALPRAZolam (XANAX) 0.5 MG tablet, Take 1 tablet (0.5 mg total) by mouth 2 (two) times daily as needed for anxiety., Disp: 60 tablet, Rfl: 2 .  amoxicillin (AMOXIL) 500 MG tablet, Take 2,000 mg (4 pills) one hour prior to all dental visits. There are enough for two visits in this bottle., Disp: 8 tablet, Rfl: 6 .  aspirin EC 81 MG tablet, Take 81 mg by mouth every evening. , Disp: , Rfl:  .  Azelaic Acid 15 % cream, Apply 1 application topically daily. After skin is thoroughly washed and patted dry, gently but thoroughly massage a thin film of azelaic acid cream into the affected area once daily, Disp: , Rfl:  .  Carboxymethylcellul-Glycerin (LUBRICATING EYE DROPS OP), Place 1 drop into both eyes 2 (two) times a day. , Disp: , Rfl:  .  cholecalciferol (VITAMIN D3) 25 MCG (1000 UT) tablet, Take 1,000 Units by mouth daily., Disp: , Rfl:  .  clopidogrel (PLAVIX) 75 MG tablet, Take 1 tablet (75 mg total) by mouth daily with breakfast., Disp: 90 tablet, Rfl: 1 .  mirabegron ER (MYRBETRIQ) 50 MG TB24 tablet, Take 1 tablet (50 mg total) by mouth daily., Disp: 90 tablet, Rfl: 3 .  Multiple Vitamin (MULTIVITAMIN WITH MINERALS) TABS tablet, Take 1 tablet by mouth daily at 3 pm. Centrum, Disp: , Rfl:  .  Omega-3 Fatty Acids (MINI FISH OIL PO), Take 2 capsules by mouth daily at 12 noon., Disp: , Rfl:  .  OVER THE  COUNTER MEDICATION, Take 1 tablet by mouth at bedtime as needed (sleep). GNC PREVENTIVE NUTRITION TRI-SLEEP, Disp: , Rfl:  .  polyethylene glycol (MIRALAX / GLYCOLAX) packet, Take 17 g by mouth 2 (two) times daily as needed (constipation). , Disp: , Rfl:  .  rosuvastatin (CRESTOR) 10 MG tablet, Take 1 tablet (10 mg total) by mouth daily., Disp: 90 tablet, Rfl: 3 .  sildenafil (REVATIO) 20 MG tablet, Take up to 5 pills about 30 min prior to sex, Disp: 50 tablet, Rfl: 8 .  telmisartan (MICARDIS) 80 MG tablet, Take 1 tablet (80 mg total) by mouth daily., Disp: 90 tablet, Rfl: 1 .  tretinoin (RETIN-A) 0.1 % cream, Apply 1 application topically at bedtime. , Disp: , Rfl: 0  Past Medical History: Past Medical History:  Diagnosis Date  . Anxiety   . Arthritis of knee   . Carotid artery disease ([place])   . 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 Coronary artery disease   . Hearing loss    wears hearing aids  . Hypertension   . Neuropathy   . S/P TAVR (transcatheter aortic valve replacement)    26 mm Edwards Sapien THV via the TF approach   . Severe aortic stenosis     Tobacco Use: Social History   Tobacco Use  Smoking Status  Never Smoker  Smokeless Tobacco Never Used    Labs: Recent Review Flowsheet Data    Labs for ITP Cardiac and Pulmonary Rehab Latest Ref Rng & Units 11/09/2015 09/29/2016 10/12/2017 09/09/2018 09/13/2018   Cholestrol <200 mg/dL - 171 - - -   LDLCALC <100 mg/dL - 90 - - -   HDL >40 mg/dL - 73 - - -   Trlycerides <150 mg/dL - 40 - - -   Hemoglobin A1c 4.8 - 5.6 % 5.4 5.4 5.5 5.5 -   PHART 7.350 - 7.450 - - - 7.468(H) 7.434   PCO2ART 32.0 - 48.0 mmHg - - - 33.8 33.3   HCO3 20.0 - 28.0 mmol/L - - - 24.2 22.3   TCO2 22 - 32 mmol/L - - - - 23   ACIDBASEDEF 0.0 - 2.0 mmol/L - - - - 1.0   O2SAT % - - - 98.6 99.0       Exercise Target Goals: Exercise Program Goal: Individual exercise prescription set using results from initial 6 min walk test and THRR while considering   patient's activity barriers and safety.   Exercise Prescription Goal: Initial exercise prescription builds to 30-45 minutes a day of aerobic activity, 2-3 days per week.  Home exercise guidelines will be given to patient during program as part of exercise prescription that the participant will acknowledge.  Activity Barriers & Risk Stratification: Activity Barriers & Cardiac Risk Stratification - 10/18/18 1507      Activity Barriers & Cardiac Risk Stratification   Activity Barriers  Deconditioning;Muscular Weakness;Other (comment)    Comments  neuropathy    Cardiac Risk Stratification  Low       6 Minute Walk: 6 Minute Walk    Row Name 10/18/18 1647         6 Minute Walk   Phase  Initial     Distance  1402 feet     Walk Time  6 minutes     # of Rest Breaks  0     MPH  2.66     METS  3.29     RPE  9     Perceived Dyspnea   1     VO2 Peak  11.53     Symptoms  No     Resting HR  61 bpm     Resting BP  134/72     Resting Oxygen Saturation   97 %     Exercise Oxygen Saturation  during 6 min walk  98 %     Max Ex. HR  109 bpm     Max Ex. BP  156/66     2 Minute Post BP  134/72 Recheck 134/70        Oxygen Initial Assessment:   Oxygen Re-Evaluation:   Oxygen Discharge (Final Oxygen Re-Evaluation):   Initial Exercise Prescription: Initial Exercise Prescription - 10/18/18 1500      Date of Initial Exercise RX and Referring Provider   Date  10/18/18    Referring Provider  Sherren Mocha MD      Treadmill   MPH  2.5    Grade  1    Minutes  15    METs  3.26      Recumbant Bike   Level  2    RPM  50    Watts  36    Minutes  15    METs  3.2      NuStep   Level  3  SPM  80    Minutes  15    METs  3.2      REL-XR   Level  2    Speed  50    Minutes  15    METs  3.2      T5 Nustep   Level  3    SPM  80    Minutes  15    METs  3.2      Prescription Details   Frequency (times per week)  3    Duration  Progress to 30 minutes of continuous  aerobic without signs/symptoms of physical distress      Intensity   THRR 40-80% of Max Heartrate  94-127    Ratings of Perceived Exertion  11-13    Perceived Dyspnea  0-4      Progression   Progression  Continue to progress workloads to maintain intensity without signs/symptoms of physical distress.      Resistance Training   Training Prescription  Yes    Weight  4 lbs    Reps  10-15       Perform Capillary Blood Glucose checks as needed.  Exercise Prescription Changes:   Exercise Comments: Exercise Comments    Row Name 10/20/18 1131           Exercise Comments  .First full day of exercise!  Patient was oriented to gym and equipment including functions, settings, policies, and procedures.  Patient's individual exercise prescription and treatment plan were reviewed.  All starting workloads were established based on the results of the 6 minute walk test done at initial orientation visit.  The plan for exercise progression was also introduced and progression will be customized based on patient's performance and goals.          Exercise Goals and Review: Exercise Goals    Row Name 10/04/18 1132             Exercise Goals   Increase Physical Activity  Yes       Intervention  Provide advice, education, support and counseling about physical activity/exercise needs.;Develop an individualized exercise prescription for aerobic and resistive training based on initial evaluation findings, risk stratification, comorbidities and participant's personal goals.       Expected Outcomes  Short Term: Attend rehab on a regular basis to increase amount of physical activity.;Long Term: Add in home exercise to make exercise part of routine and to increase amount of physical activity.;Long Term: Exercising regularly at least 3-5 days a week.       Increase Strength and Stamina  Yes       Intervention  Provide advice, education, support and counseling about physical activity/exercise  needs.;Develop an individualized exercise prescription for aerobic and resistive training based on initial evaluation findings, risk stratification, comorbidities and participant's personal goals.       Expected Outcomes  Short Term: Increase workloads from initial exercise prescription for resistance, speed, and METs.;Short Term: Perform resistance training exercises routinely during rehab and add in resistance training at home;Long Term: Improve cardiorespiratory fitness, muscular endurance and strength as measured by increased METs and functional capacity (6MWT)       Able to understand and use rate of perceived exertion (RPE) scale  Yes       Intervention  Provide education and explanation on how to use RPE scale       Expected Outcomes  Short Term: Able to use RPE daily in rehab to express subjective intensity level;Long Term:  Able  to use RPE to guide intensity level when exercising independently       Able to understand and use Dyspnea scale  Yes       Intervention  Provide education and explanation on how to use Dyspnea scale       Expected Outcomes  Short Term: Able to use Dyspnea scale daily in rehab to express subjective sense of shortness of breath during exertion;Long Term: Able to use Dyspnea scale to guide intensity level when exercising independently       Knowledge and understanding of Target Heart Rate Range (THRR)  Yes       Intervention  Provide education and explanation of THRR including how the numbers were predicted and where they are located for reference       Expected Outcomes  Short Term: Able to state/look up THRR;Short Term: Able to use daily as guideline for intensity in rehab;Long Term: Able to use THRR to govern intensity when exercising independently       Able to check pulse independently  Yes       Intervention  Provide education and demonstration on how to check pulse in carotid and radial arteries.;Review the importance of being able to check your own pulse for safety  during independent exercise       Expected Outcomes  Short Term: Able to explain why pulse checking is important during independent exercise;Long Term: Able to check pulse independently and accurately       Understanding of Exercise Prescription  Yes       Intervention  Provide education, explanation, and written materials on patient's individual exercise prescription       Expected Outcomes  Short Term: Able to explain program exercise prescription;Long Term: Able to explain home exercise prescription to exercise independently          Exercise Goals Re-Evaluation : Exercise Goals Re-Evaluation    [place] Name 10/20/18 1132             Exercise Goal Re-Evaluation   Exercise Goals Review  Increase Physical Activity;Increase Strength and Stamina;Able to understand and use rate of perceived exertion (RPE) scale;Able to understand and use Dyspnea scale;Knowledge and understanding of Target Heart Rate Range (THRR);Understanding of Exercise Prescription       Comments  Reviewed RPE scale, THR and program prescription with pt today.  Pt voiced understanding and was given a copy of goals to take home.       Expected Outcomes  Short: Use RPE daily to regulate intensity. Long: Follow program prescription in THR.          Discharge Exercise Prescription (Final Exercise Prescription Changes):   Nutrition:  Target Goals: Understanding of nutrition guidelines, daily intake of sodium <1521m, cholesterol <2075m calories 30% from fat and 7% or less from saturated fats, daily to have 5 or more servings of fruits and vegetables.  Biometrics: Pre Biometrics - 10/18/18 1510      Pre Biometrics   Height  6' 2.2" (1.885 m)    Weight  193 lb 4.8 oz (87.7 kg)    BMI (Calculated)  24.68        Nutrition Therapy Plan and Nutrition Goals: Nutrition Therapy & Goals - 10/03/18 1200      Nutrition Therapy   Diet  HH, low Na    Protein (specify units)  70g    Fiber  25 grams    Whole Grain Foods  3  servings    Saturated Fats  12 max. grams  Fruits and Vegetables  5 servings/day    Sodium  1.5 grams      Personal Nutrition Goals   Nutrition Goal  ST: continue HH eating, learn more about HH eating LT: maintian weight    Comments  Used to be very disciplined 5 years ago when his medical problems started. Tries to eat reasonably healthy and nowuses butter/olive oil combination, now eats whole wheat and whole grains, will have eggs with bacon 2x/week, banana with cereal (rice crispy and corn flakes together but sometimes will have raisin bran) and frozen fruit (like berries) x3x/week and will have applesauce on toast instead of jelly. Will normally eat B and D. Has a jar of peanuts for a snack and will have cuties or halos every once in a while. D: will grill steak every once in a while, chicken breast, frozen veggies (steams) broccoli, brussell sprouts, sweet potatoes every once in a while, corn (not much). (meat or poultry with vegetable) once and a while will have fish (bake or broil) gets at [employer] but hasn't been over there with COVID and  doesn't use mayo uses low calorie miricle whip with tuna and toast. Will have some frozen pizza or fish sticks every once in a while. will have baked beans (don't like other beans). Doesn't fry much, tries to grill it, bake it, or broil it. will have graham crackers for a snack or some angel food cake and frozen yogurt. will has some mild constipation due to bladder pills. weight 185-189lbs, focus on goal to 182-184lbs - weighs regularly. Will use olive oil for most things, will eat greek salads and chef salads (regular dressings) and gets them out. Disscussed MyPlate and HH eating, answered all questions (corn, peas, whole wheat, gluten). Water consumption low, but will mix water with other things.      Intervention Plan   Intervention  Prescribe, educate and counsel regarding individualized specific dietary modifications aiming towards targeted core  components such as weight, hypertension, lipid management, diabetes, heart failure and other comorbidities.;Nutrition handout(s) given to patient.    Expected Outcomes  Short Term Goal: A plan has been developed with personal nutrition goals set during dietitian appointment.;Long Term Goal: Adherence to prescribed nutrition plan.;Short Term Goal: Understand basic principles of dietary content, such as calories, fat, sodium, cholesterol and nutrients.       Nutrition Assessments: Nutrition Assessments - 10/18/18 1657      MEDFICTS Scores   Pre Score  21       Nutrition Goals Re-Evaluation:   Nutrition Goals Discharge (Final Nutrition Goals Re-Evaluation):   Psychosocial: Target Goals: Acknowledge presence or absence of significant depression and/or stress, maximize coping skills, provide positive support system. Participant is able to verbalize types and ability to use techniques and skills needed for reducing stress and depression.   Initial Review & Psychosocial Screening:   Quality of Life Scores:  Quality of Life - 10/18/18 1658      Quality of Life   Select  Quality of Life      Quality of Life Scores   Health/Function Pre  24.03 %    Socioeconomic Pre  23.43 %    Psych/Spiritual Pre  26.14 %    Family Pre  27.6 %    GLOBAL Pre  24.82 %      Scores of 19 and below usually indicate a poorer quality of life in these areas.  A difference of  2-3 points is a clinically meaningful difference.  A difference  of 2-3 points in the total score of the Quality of Life Index has been associated with significant improvement in overall quality of life, self-image, physical symptoms, and general health in studies assessing change in quality of life.  PHQ-9: Recent Review Flowsheet Data    Depression screen [facility] 2/9 04/19/2018 04/19/2018 11/23/2017 10/12/2017 04/16/2017   Decreased Interest 0 0 0 0 0   Down, Depressed, Hopeless 0 0 0 0 0   PHQ - 2 Score 0 0 0 0 0   Altered sleeping 0 - - - 0    Tired, decreased energy 0 - - - 0   Change in appetite 0 - - - 0   Feeling bad or failure about yourself  0 - - - 0   Trouble concentrating 0 - - - 0   Moving slowly or fidgety/restless 0 - - - 0   Suicidal thoughts 0 - - - 0   PHQ-9 Score 0 - - - 0   Difficult doing work/chores Not difficult at all - - - Not difficult at all     Interpretation of Total Score  Total Score Depression Severity:  1-4 = Minimal depression, 5-9 = Mild depression, 10-14 = Moderate depression, 15-19 = Moderately severe depression, 20-27 = Severe depression   Psychosocial Evaluation and Intervention:   Psychosocial Re-Evaluation:   Psychosocial Discharge (Final Psychosocial Re-Evaluation):   Vocational Rehabilitation: Provide vocational rehab assistance to qualifying candidates.   Vocational Rehab Evaluation & Intervention:   Education: Education Goals: Education classes will be provided on a variety of topics geared toward better understanding of heart health and risk factor modification. Participant will state understanding/return demonstration of topics presented as noted by education test scores.  Learning Barriers/Preferences:   Education Topics:  AED/CPR: - Group verbal and written instruction with the use of models to demonstrate the basic use of the AED with the basic ABC's of resuscitation.   General Nutrition Guidelines/Fats and Fiber: -Group instruction provided by verbal, written material, models and posters to present the general guidelines for heart healthy nutrition. Gives an explanation and review of dietary fats and fiber.   Controlling Sodium/Reading Food Labels: -Group verbal and written material supporting the discussion of sodium use in heart healthy nutrition. Review and explanation with models, verbal and written materials for utilization of the food label.   Exercise Physiology & General Exercise Guidelines: - Group verbal and written instruction with models to  review the exercise physiology of the cardiovascular system and associated critical values. Provides general exercise guidelines with specific guidelines to those with heart or lung disease.    Aerobic Exercise & Resistance Training: - Gives group verbal and written instruction on the various components of exercise. Focuses on aerobic and resistive training programs and the benefits of this training and how to safely progress through these programs..   Flexibility, Balance, Mind/Body Relaxation: Provides group verbal/written instruction on the benefits of flexibility and balance training, including mind/body exercise modes such as yoga, pilates and tai chi.  Demonstration and skill practice provided.   Stress and Anxiety: - Provides group verbal and written instruction about the health risks of elevated stress and causes of high stress.  Discuss the correlation between heart/lung disease and anxiety and treatment options. Review healthy ways to manage with stress and anxiety.   Depression: - Provides group verbal and written instruction on the correlation between heart/lung disease and depressed mood, treatment options, and the stigmas associated with seeking treatment.   Anatomy & Physiology of  the Heart: - Group verbal and written instruction and models provide basic cardiac anatomy and physiology, with the coronary electrical and arterial systems. Review of Valvular disease and Heart Failure   Cardiac Procedures: - Group verbal and written instruction to review commonly prescribed medications for heart disease. Reviews the medication, class of the drug, and side effects. Includes the steps to properly store meds and maintain the prescription regimen. (beta blockers and nitrates)   Cardiac Medications I: - Group verbal and written instruction to review commonly prescribed medications for heart disease. Reviews the medication, class of the drug, and side effects. Includes the steps to  properly store meds and maintain the prescription regimen.   Cardiac Medications II: -Group verbal and written instruction to review commonly prescribed medications for heart disease. Reviews the medication, class of the drug, and side effects. (all other drug classes)    Go Sex-Intimacy & Heart Disease, Get SMART - Goal Setting: - Group verbal and written instruction through game format to discuss heart disease and the return to sexual intimacy. Provides group verbal and written material to discuss and apply goal setting through the application of the S.M.A.R.T. Method.   Other Matters of the Heart: - Provides group verbal, written materials and models to describe Stable Angina and Peripheral Artery. Includes description of the disease process and treatment options available to the cardiac patient.   Exercise & Equipment Safety: - Individual verbal instruction and demonstration of equipment use and safety with use of the equipment.   Cardiac Rehab from 10/18/2018 in [facility] Cardiac and Pulmonary Rehab  Date  10/18/18  Educator  [facility]  Instruction Review Code  1- Verbalizes Understanding      Infection Prevention: - Provides verbal and written material to individual with discussion of infection control including proper hand washing and proper equipment cleaning during exercise session.   Cardiac Rehab from 10/18/2018 in [facility] Cardiac and Pulmonary Rehab  Date  10/18/18  Educator  [facility]  Instruction Review Code  1- Verbalizes Understanding      Falls Prevention: - Provides verbal and written material to individual with discussion of falls prevention and safety.   Cardiac Rehab from 10/18/2018 in [facility] Cardiac and Pulmonary Rehab  Date  10/18/18  Educator  [facility]  Instruction Review Code  1- Verbalizes Understanding      Diabetes: - Individual verbal and written instruction to review signs/symptoms of diabetes, desired ranges of glucose level fasting, after meals and with exercise. Acknowledge  that pre and post exercise glucose checks will be done for 3 sessions at entry of program.   Know Your Numbers and Risk Factors: -Group verbal and written instruction about important numbers in your health.  Discussion of what are risk factors and how they play a role in the disease process.  Review of Cholesterol, Blood Pressure, Diabetes, and BMI and the role they play in your overall health.   Sleep Hygiene: -Provides group verbal and written instruction about how sleep can affect your health.  Define sleep hygiene, discuss sleep cycles and impact of sleep habits. Review good sleep hygiene tips.    Other: -Provides group and verbal instruction on various topics (see comments)   Knowledge Questionnaire Score: Knowledge Questionnaire Score - 10/18/18 1657      Knowledge Questionnaire Score   Pre Score  24/26   Education Focus: Exercise and Nutrition      Core Components/Risk Factors/Patient Goals at Admission: Personal Goals and Risk Factors at Admission - 10/18/18 1510  Core Components/Risk Factors/Patient Goals on Admission    Weight Management  Yes;Weight Maintenance    Intervention  Weight Management: Develop a combined nutrition and exercise program designed to reach desired caloric intake, while maintaining appropriate intake of nutrient and fiber, sodium and fats, and appropriate energy expenditure required for the weight goal.;Weight Management: Provide education and appropriate resources to help participant work on and attain dietary goals.    Admit Weight  193 lb 4.8 oz (87.7 kg)    Goal Weight: Short Term  193 lb (87.5 kg)    Goal Weight: Long Term  193 lb (87.5 kg)    Expected Outcomes  Short Term: Continue to assess and modify interventions until short term weight is achieved;Long Term: Adherence to nutrition and physical activity/exercise program aimed toward attainment of established weight goal;Weight Maintenance: Understanding of the daily nutrition guidelines,  which includes 25-35% calories from fat, 7% or less cal from saturated fats, less than 227m cholesterol, less than 1.5gm of sodium, & 5 or more servings of fruits and vegetables daily    Heart Failure  Yes    Intervention  Provide a combined exercise and nutrition program that is supplemented with education, support and counseling about heart failure. Directed toward relieving symptoms such as shortness of breath, decreased exercise tolerance, and extremity edema.    Expected Outcomes  Improve functional capacity of life;Short term: Attendance in program 2-3 days a week with increased exercise capacity. Reported lower sodium intake. Reported increased fruit and vegetable intake. Reports medication compliance.;Short term: Daily weights obtained and reported for increase. Utilizing diuretic protocols set by physician.;Long term: Adoption of self-care skills and reduction of barriers for early signs and symptoms recognition and intervention leading to self-care maintenance.    Hypertension  Yes    Intervention  Provide education on lifestyle modifcations including regular physical activity/exercise, weight management, moderate sodium restriction and increased consumption of fresh fruit, vegetables, and low fat dairy, alcohol moderation, and smoking cessation.;Monitor prescription use compliance.    Expected Outcomes  Short Term: Continued assessment and intervention until BP is < 140/958mHG in hypertensive participants. < 130/8059mG in hypertensive participants with diabetes, heart failure or chronic kidney disease.;Long Term: Maintenance of blood pressure at goal levels.       Core Components/Risk Factors/Patient Goals Review:    Core Components/Risk Factors/Patient Goals at Discharge (Final Review):    ITP Comments: ITP Comments    Row Name 09/27/18 1610 10/03/18 1259 10/18/18 1506 10/26/18 1421     ITP Comments  Virtual Cardiac Rehab visit completed. Consent and Intake completed.  Appointments  made for EP and RD  nutrition consult completed  Completed intial 6MWT and completed intake.  [facility].  ITP created and sent for review to medical director, Dr. MarEmily Filbert30 day review cycle restarting  after being closed since March 16 because of  Covid 19 pandemic. Program opened to patients on July 6. Not all have returned. ITP updated and sent to Medical Director for review,changes as needed and signature       Comments:

## 2018-10-26 NOTE — Progress Notes (Signed)
Daily Session Note  Patient Details  Name: James Cardenas MRN: [id number] Date of Birth: 11-12-1940 Referring Provider:     Cardiac Rehab from 10/18/2018 in [facility] Cardiac and Pulmonary Rehab  Referring Provider  Sherren Mocha MD      Encounter Date: 10/26/2018  Check In: Session Check In - 10/26/18 1040      Check-In   Supervising physician immediately available to respond to emergencies  See telemetry face sheet for immediately available ER MD    Location  [facility]-Cardiac & Pulmonary Rehab    Staff Present  Renita Papa, RN BSN;Joseph [address] [place], [place], RCEP, CCRP, CCET    Virtual Visit  No    Medication changes reported      Yes    Comments  added Crestor 10 mg    Fall or balance concerns reported     No    Warm-up and Cool-down  Performed on first and last piece of equipment    Resistance Training Performed  Yes    VAD Patient?  No    PAD/SET Patient?  No      Pain Assessment   Currently in Pain?  No/denies          Social History   Tobacco Use  Smoking Status Never Smoker  Smokeless Tobacco Never Used    Goals Met:  Independence with exercise equipment Exercise tolerated well No report of cardiac concerns or symptoms Strength training completed today  Goals Unmet:  Not Applicable  Comments: Pt able to follow exercise prescription today without complaint.  Will continue to monitor for progression.    Dr. Emily Filbert is Medical Director for [place] and [facility].

## 2018-10-27 ENCOUNTER — Other Ambulatory Visit: Payer: Self-pay

## 2018-10-27 ENCOUNTER — Encounter: Payer: Medicare Other | Admitting: *Deleted

## 2018-10-27 DIAGNOSIS — Z952 Presence of prosthetic heart valve: Secondary | ICD-10-CM

## 2018-10-27 NOTE — Progress Notes (Signed)
Daily Session Note  Patient Details  Name: James Cardenas MRN: [id number] Date of Birth: 1940/10/05 Referring Provider:     Cardiac Rehab from 10/18/2018 in [facility] Cardiac and Pulmonary Rehab  Referring Provider  Sherren Mocha MD      Encounter Date: 10/27/2018  Check In: Session Check In - 10/27/18 1033      Check-In   Supervising physician immediately available to respond to emergencies  See telemetry face sheet for immediately available ER MD    Location  [facility]    Staff Present  Heath Lark, RN, BSN, CCRP;Jeanna Durrell BS, Exercise Physiologist;Temesha Queener Wolverine, MA, RCEP, CCRP, CCET    Virtual Visit  No    Medication changes reported      No    Fall or balance concerns reported     No    Warm-up and Cool-down  Performed on first and last piece of equipment    Resistance Training Performed  Yes    VAD Patient?  No    PAD/SET Patient?  No      Pain Assessment   Currently in Pain?  No/denies          Social History   Tobacco Use  Smoking Status Never Smoker  Smokeless Tobacco Never Used    Goals Met:  Independence with exercise equipment Exercise tolerated well No report of cardiac concerns or symptoms Strength training completed today  Goals Unmet:  Not Applicable  Comments: Pt able to follow exercise prescription today without complaint.  Will continue to monitor for progression.    Dr. Emily Filbert is Medical Director for [name].

## 2018-10-31 ENCOUNTER — Encounter: Payer: Self-pay | Admitting: Podiatry

## 2018-10-31 ENCOUNTER — Ambulatory Visit (INDEPENDENT_AMBULATORY_CARE_PROVIDER_SITE_OTHER): Payer: Medicare Other | Admitting: Podiatry

## 2018-10-31 ENCOUNTER — Other Ambulatory Visit: Payer: Self-pay

## 2018-10-31 VITALS — Temp 98.5°F

## 2018-10-31 DIAGNOSIS — B351 Tinea unguium: Secondary | ICD-10-CM | POA: Diagnosis not present

## 2018-10-31 DIAGNOSIS — M79676 Pain in unspecified toe(s): Secondary | ICD-10-CM | POA: Diagnosis not present

## 2018-10-31 DIAGNOSIS — G629 Polyneuropathy, unspecified: Secondary | ICD-10-CM

## 2018-10-31 NOTE — Progress Notes (Signed)
Complaint:  Visit Type: Patient returns to my office for continued preventative foot care services. Complaint: Patient states" my nails have grown long and thick and become painful to walk and wear shoes.   The patient presents for preventative foot care services. No changes to ROS  Podiatric Exam: Vascular: dorsalis pedis and posterior tibial pulses are palpable bilateral. Capillary return is immediate. Temperature gradient is WNL. Skin turgor WNL  Sensorium: Normal Semmes Weinstein monofilament test. Normal tactile sensation bilaterally. Nail Exam: Pt has thick disfigured discolored nails with subungual debris noted bilateral entire nail hallux through fifth toenails Ulcer Exam: There is no evidence of ulcer or pre-ulcerative changes or infection. Orthopedic Exam: Muscle tone and strength are WNL. No limitations in general ROM. No crepitus or effusions noted. Foot type and digits show no abnormalities. Hallux malleus  B/L  Hammer toes  B/l. Skin: No Porokeratosis. No infection or ulcers.    Diagnosis:  Onychomycosis, , Pain in right toe, pain in left toes  Treatment & Plan Procedures and Treatment: Consent by patient was obtained for treatment procedures.   Debridement of mycotic and hypertrophic toenails, 1 through 5 bilateral and clearing of subungual debris. No ulceration, no infection noted.  Patient desires a new pair of orthoses.  To make an appointment with Liliane Channel. Return Visit-Office Procedure: Patient instructed to return to the office for a follow up visit 10 weeks  for continued evaluation and treatment.    Gardiner Barefoot DPM

## 2018-11-02 ENCOUNTER — Encounter: Payer: Self-pay | Admitting: Thoracic Surgery (Cardiothoracic Vascular Surgery)

## 2018-11-02 ENCOUNTER — Other Ambulatory Visit: Payer: Self-pay

## 2018-11-02 ENCOUNTER — Encounter: Payer: Medicare Other | Admitting: *Deleted

## 2018-11-02 DIAGNOSIS — Z952 Presence of prosthetic heart valve: Secondary | ICD-10-CM | POA: Diagnosis not present

## 2018-11-02 NOTE — Progress Notes (Signed)
Daily Session Note  Patient Details  Name: James Cardenas MRN: [id number] Date of Birth: 09/05/1940 Referring Provider:     Cardiac Rehab from 10/18/2018 in [facility] Cardiac and Pulmonary Rehab  Referring Provider  Cooper, Michael MD      Encounter Date: 11/02/2018  Check In: Session Check In - 11/02/18 1023      Check-In   Supervising physician immediately available to respond to emergencies  See telemetry face sheet for immediately available ER MD    Location  [facility] Re[facility]    Staff Present  Jessica Hawkins, MA, RCEP, CCRP, CCET;Joseph Hood RCP,RRT,BSRT;Susanne Bice, RN, BSN, CCRP    Virtual Visit  No    Medication changes reported      No    Fall or balance concerns reported     No    Warm-up and Cool-down  Performed on first and last piece of equipment    Resistance Training Performed  Yes    VAD Patient?  No    PAD/SET Patient?  No      Pain Assessment   Currently in Pain?  No/denies          Social History   Tobacco Use  Smoking Status Never Smoker  Smokeless Tobacco Never Used    Goals Met:  Independence with exercise equipment Exercise tolerated well No report of cardiac concerns or symptoms Strength training completed today  Goals Unmet:  Not Applicable  Comments: Pt able to follow exercise prescription today without complaint.  Will continue to monitor for progression.    Dr. Mark Miller is Medical Director for [facility] and [facility]. 

## 2018-11-03 ENCOUNTER — Encounter: Payer: Medicare Other | Admitting: *Deleted

## 2018-11-03 DIAGNOSIS — Z952 Presence of prosthetic heart valve: Secondary | ICD-10-CM | POA: Diagnosis not present

## 2018-11-03 NOTE — Progress Notes (Signed)
Daily Session Note  Patient Details  Name: SHONTE SODERLUND MRN: [id number] Date of Birth: 21-Feb-1941 Referring Provider:     Cardiac Rehab from 10/18/2018 in [facility] Cardiac and Pulmonary Rehab  Referring Provider  Sherren Mocha MD      Encounter Date: 11/03/2018  Check In: Session Check In - 11/03/18 1031      Check-In   Supervising physician immediately available to respond to emergencies  See telemetry face sheet for immediately available ER MD    Location  [facility]    Staff Present  Renita Papa, RN BSN;Jeanna Durrell BS, Exercise Physiologist;Joseph Tessie Fass RCP,RRT,BSRT    Virtual Visit  No    Medication changes reported      No    Fall or balance concerns reported     No    Warm-up and Cool-down  Performed on first and last piece of equipment    Resistance Training Performed  Yes    VAD Patient?  No    PAD/SET Patient?  No      Pain Assessment   Currently in Pain?  No/denies          Social History   Tobacco Use  Smoking Status Never Smoker  Smokeless Tobacco Never Used    Goals Met:  Independence with exercise equipment Exercise tolerated well No report of cardiac concerns or symptoms Strength training completed today  Goals Unmet:  Not Applicable  Comments: Pt able to follow exercise prescription today without complaint.  Will continue to monitor for progression.    Dr. Emily Filbert is Medical Director for [place] and [facility].

## 2018-11-07 ENCOUNTER — Other Ambulatory Visit: Payer: Self-pay

## 2018-11-07 ENCOUNTER — Encounter: Payer: Medicare Other | Admitting: *Deleted

## 2018-11-07 DIAGNOSIS — Z952 Presence of prosthetic heart valve: Secondary | ICD-10-CM

## 2018-11-07 NOTE — Progress Notes (Signed)
Daily Session Note  Patient Details  Name: James Cardenas MRN: [id number] Date of Birth: 07/01/1940 Referring Provider:     Cardiac Rehab from 10/18/2018 in [facility] Cardiac and Pulmonary Rehab  Referring Provider  Sherren Mocha MD      Encounter Date: 11/07/2018  Check In: Session Check In - 11/07/18 1417      Check-In   Supervising physician immediately available to respond to emergencies  See telemetry face sheet for immediately available ER MD    Location  [facility]    Staff Present  Renita Papa, RN Moises Blood, BS, ACSM CEP, Exercise Physiologist;Joseph Tessie Fass RCP,RRT,BSRT    Virtual Visit  No    Medication changes reported      No    Fall or balance concerns reported     No    Warm-up and Cool-down  Performed on first and last piece of equipment    Resistance Training Performed  Yes    VAD Patient?  No    PAD/SET Patient?  No      Pain Assessment   Currently in Pain?  No/denies          Social History   Tobacco Use  Smoking Status Never Smoker  Smokeless Tobacco Never Used    Goals Met:  Independence with exercise equipment Exercise tolerated well Personal goals reviewed No report of cardiac concerns or symptoms Strength training completed today  Goals Unmet:  Not Applicable  Comments: Pt able to follow exercise prescription today without complaint.  Will continue to monitor for progression.  Updated home exercise with pt today.  Pt plans to walk and use bike at home for exercise.  Reviewed THR, pulse, RPE, sign and symptoms, and when to call 911 or MD.  Also discussed weather considerations and indoor options.  Pt voiced understanding.   Dr. Emily Filbert is Medical Director for [place] and [facility].

## 2018-11-09 ENCOUNTER — Ambulatory Visit (INDEPENDENT_AMBULATORY_CARE_PROVIDER_SITE_OTHER): Payer: Medicare Other | Admitting: Orthotics

## 2018-11-09 ENCOUNTER — Encounter: Payer: Medicare Other | Admitting: *Deleted

## 2018-11-09 ENCOUNTER — Other Ambulatory Visit: Payer: Self-pay

## 2018-11-09 DIAGNOSIS — G629 Polyneuropathy, unspecified: Secondary | ICD-10-CM

## 2018-11-09 DIAGNOSIS — L84 Corns and callosities: Secondary | ICD-10-CM

## 2018-11-09 DIAGNOSIS — M2041 Other hammer toe(s) (acquired), right foot: Secondary | ICD-10-CM

## 2018-11-09 DIAGNOSIS — M2042 Other hammer toe(s) (acquired), left foot: Secondary | ICD-10-CM

## 2018-11-09 DIAGNOSIS — Z952 Presence of prosthetic heart valve: Secondary | ICD-10-CM | POA: Diagnosis not present

## 2018-11-09 NOTE — Progress Notes (Signed)
Daily Session Note  Patient Details  Name: James Cardenas MRN: [id number] Date of Birth: 15-Nov-1940 Referring Provider:     Cardiac Rehab from 10/18/2018 in [facility] Cardiac and Pulmonary Rehab  Referring Provider  Sherren Mocha MD      Encounter Date: 11/09/2018  Check In: Session Check In - 11/09/18 1429      Check-In   Supervising physician immediately available to respond to emergencies  See telemetry face sheet for immediately available ER MD    Location  [facility]    Staff Present  Renita Papa, RN BSN;Melissa Caiola RDN, LDN;Joseph Tessie Fass RCP,RRT,BSRT    Virtual Visit  No    Medication changes reported      No    Fall or balance concerns reported     Yes    Warm-up and Cool-down  Performed on first and last piece of equipment    Resistance Training Performed  Yes    VAD Patient?  No      Pain Assessment   Currently in Pain?  No/denies          Social History   Tobacco Use  Smoking Status Never Smoker  Smokeless Tobacco Never Used    Goals Met:  Independence with exercise equipment Exercise tolerated well No report of cardiac concerns or symptoms Strength training completed today  Goals Unmet:  Not Applicable  Comments: Pt able to follow exercise prescription today without complaint.  Will continue to monitor for progression.    Dr. Emily Filbert is Medical Director for [place] and [facility].

## 2018-11-09 NOTE — Progress Notes (Signed)
Patient wants new f/o same specs as 2018.

## 2018-11-10 ENCOUNTER — Encounter: Payer: Medicare Other | Admitting: *Deleted

## 2018-11-10 DIAGNOSIS — Z952 Presence of prosthetic heart valve: Secondary | ICD-10-CM | POA: Diagnosis not present

## 2018-11-10 NOTE — Progress Notes (Signed)
Daily Session Note  Patient Details  Name: AMONTE BROOKOVER MRN: [id number] Date of Birth: 19-Jun-1940 Referring Provider:     Cardiac Rehab from 10/18/2018 in [facility] Cardiac and Pulmonary Rehab  Referring Provider  Sherren Mocha MD      Encounter Date: 11/10/2018  Check In: Session Check In - 11/10/18 1438      Check-In   Supervising physician immediately available to respond to emergencies  See telemetry face sheet for immediately available ER MD    Location  [facility]    Staff Present  Renita Papa, RN BSN;Jessica Luan Pulling, MA, RCEP, CCRP, CCET;Joseph Doran RCP,RRT,BSRT    Virtual Visit  No    Medication changes reported      No    Fall or balance concerns reported     No    Warm-up and Cool-down  Performed on first and last piece of equipment    Resistance Training Performed  Yes    VAD Patient?  No    PAD/SET Patient?  No      Pain Assessment   Currently in Pain?  No/denies          Social History   Tobacco Use  Smoking Status Never Smoker  Smokeless Tobacco Never Used    Goals Met:  Independence with exercise equipment Exercise tolerated well No report of cardiac concerns or symptoms Strength training completed today  Goals Unmet:  Not Applicable  Comments: Pt able to follow exercise prescription today without complaint.  Will continue to monitor for progression.    Dr. Emily Filbert is Medical Director for [place] and [facility].

## 2018-11-14 ENCOUNTER — Other Ambulatory Visit: Payer: Self-pay

## 2018-11-14 ENCOUNTER — Encounter: Payer: Medicare Other | Attending: Cardiovascular Disease | Admitting: *Deleted

## 2018-11-14 DIAGNOSIS — Z952 Presence of prosthetic heart valve: Secondary | ICD-10-CM | POA: Insufficient documentation

## 2018-11-14 NOTE — Progress Notes (Signed)
Daily Session Note  Patient Details  Name: ANURAG SCARFO MRN: [id number] Date of Birth: February 07, 1941 Referring Provider:     Cardiac Rehab from 10/18/2018 in [facility] Cardiac and Pulmonary Rehab  Referring Provider  Sherren Mocha MD      Encounter Date: 11/14/2018  Check In: Session Check In - 11/14/18 1429      Check-In   Supervising physician immediately available to respond to emergencies  See telemetry face sheet for immediately available ER MD    Location  [facility]    Staff Present  Renita Papa, RN BSN;Melissa Caiola RDN, LDN;Joseph Tessie Fass RCP,RRT,BSRT    Virtual Visit  No    Medication changes reported      No    Warm-up and Cool-down  Performed on first and last piece of equipment    Resistance Training Performed  Yes    VAD Patient?  No    PAD/SET Patient?  No      Pain Assessment   Currently in Pain?  No/denies          Social History   Tobacco Use  Smoking Status Never Smoker  Smokeless Tobacco Never Used    Goals Met:  Independence with exercise equipment Exercise tolerated well No report of cardiac concerns or symptoms Strength training completed today  Goals Unmet:  Not Applicable  Comments: Pt able to follow exercise prescription today without complaint.  Will continue to monitor for progression.    Dr. Emily Filbert is Medical Director for [place] and [facility].

## 2018-11-15 ENCOUNTER — Telehealth: Payer: Self-pay

## 2018-11-15 MED ORDER — MIRABEGRON ER 50 MG PO TB24: 50.0000 mg | ORAL_TABLET | Freq: Every day | ORAL | 3 refills | Status: DC

## 2018-11-15 NOTE — Telephone Encounter (Signed)
Myrbetriq refill sent to pharmacy

## 2018-11-16 ENCOUNTER — Other Ambulatory Visit: Payer: Self-pay

## 2018-11-16 DIAGNOSIS — Z952 Presence of prosthetic heart valve: Secondary | ICD-10-CM

## 2018-11-16 NOTE — Progress Notes (Signed)
Daily Session Note  Patient Details  Name: James Cardenas MRN: [id number] Date of Birth: March 30, 1941 Referring Provider:     Cardiac Rehab from 10/18/2018 in [facility] Cardiac and Pulmonary Rehab  Referring Provider  Sherren Mocha MD      Encounter Date: 11/16/2018  Check In: Session Check In - 11/16/18 1441      Check-In   Supervising physician immediately available to respond to emergencies  See telemetry face sheet for immediately available ER MD    Location  [facility]    Staff Present  Justin Mend RCP,RRT,BSRT;Melissa Caiola RDN, Rowe Pavy, BA, ACSM CEP, Exercise Physiologist;Mary Kellie Shropshire, RN, BSN, MA    Virtual Visit  No    Medication changes reported      No    Fall or balance concerns reported     No    Warm-up and Cool-down  Performed on first and last piece of equipment    Resistance Training Performed  Yes    VAD Patient?  No    PAD/SET Patient?  No      Pain Assessment   Currently in Pain?  No/denies          Social History   Tobacco Use  Smoking Status Never Smoker  Smokeless Tobacco Never Used    Goals Met:  Independence with exercise equipment Exercise tolerated well No report of cardiac concerns or symptoms Strength training completed today  Goals Unmet:  Not Applicable  Comments: Pt able to follow exercise prescription today without complaint.  Will continue to monitor for progression.    Dr. Emily Filbert is Medical Director for [place] and [facility].

## 2018-11-17 ENCOUNTER — Encounter: Payer: Medicare Other | Admitting: *Deleted

## 2018-11-17 DIAGNOSIS — Z952 Presence of prosthetic heart valve: Secondary | ICD-10-CM | POA: Diagnosis not present

## 2018-11-17 NOTE — Progress Notes (Signed)
Daily Session Note  Patient Details  Name: NASHAWN HILLOCK MRN: [id number] Date of Birth: 07/01/1940 Referring Provider:     Cardiac Rehab from 10/18/2018 in [facility] Cardiac and Pulmonary Rehab  Referring Provider  Sherren Mocha MD      Encounter Date: 11/17/2018  Check In: Session Check In - 11/17/18 1419      Check-In   Supervising physician immediately available to respond to emergencies  See telemetry face sheet for immediately available ER MD    Location  [facility]    Staff Present  Renita Papa, RN BSN;Laureen Owens Shark, BS, RRT, CPFT;Amanda Oletta Darter, BA, ACSM CEP, Exercise Physiologist;Joseph Tessie Fass RCP,RRT,BSRT    Virtual Visit  No    Medication changes reported      No    Fall or balance concerns reported     No    Warm-up and Cool-down  Performed on first and last piece of equipment    Resistance Training Performed  Yes    VAD Patient?  No    PAD/SET Patient?  No      Pain Assessment   Currently in Pain?  No/denies          Social History   Tobacco Use  Smoking Status Never Smoker  Smokeless Tobacco Never Used    Goals Met:  Independence with exercise equipment Exercise tolerated well No report of cardiac concerns or symptoms Strength training completed today  Goals Unmet:  Not Applicable  Comments: Pt able to follow exercise prescription today without complaint.  Will continue to monitor for progression.    Dr. Emily Filbert is Medical Director for [place] and [facility].

## 2018-11-21 ENCOUNTER — Encounter: Payer: Medicare Other | Admitting: *Deleted

## 2018-11-21 ENCOUNTER — Other Ambulatory Visit: Payer: Self-pay

## 2018-11-21 DIAGNOSIS — Z952 Presence of prosthetic heart valve: Secondary | ICD-10-CM

## 2018-11-21 NOTE — Progress Notes (Signed)
Daily Session Note  Patient Details  Name: ZAIRE LEVESQUE MRN: [id number] Date of Birth: 02/08/1941 Referring Provider:     Cardiac Rehab from 10/18/2018 in [facility] Cardiac and Pulmonary Rehab  Referring Provider  Sherren Mocha MD      Encounter Date: 11/21/2018  Check In: Session Check In - 11/21/18 1423      Check-In   Supervising physician immediately available to respond to emergencies  See telemetry face sheet for immediately available ER MD    Location  [facility]    Staff Present  Renita Papa, RN Vickki Hearing, BA, ACSM CEP, Exercise Physiologist;Kelly Amedeo Plenty, BS, ACSM CEP, Exercise Physiologist;Joseph Tessie Fass RCP,RRT,BSRT    Virtual Visit  No    Medication changes reported      No    Fall or balance concerns reported     No    Warm-up and Cool-down  Performed on first and last piece of equipment    Resistance Training Performed  Yes    VAD Patient?  No    PAD/SET Patient?  No      Pain Assessment   Currently in Pain?  No/denies          Social History   Tobacco Use  Smoking Status Never Smoker  Smokeless Tobacco Never Used    Goals Met:  Independence with exercise equipment Exercise tolerated well No report of cardiac concerns or symptoms Strength training completed today  Goals Unmet:  Not Applicable  Comments: Pt able to follow exercise prescription today without complaint.  Will continue to monitor for progression.    Dr. Emily Filbert is Medical Director for [place] and [facility].

## 2018-11-23 ENCOUNTER — Encounter: Payer: Self-pay | Admitting: *Deleted

## 2018-11-23 DIAGNOSIS — Z952 Presence of prosthetic heart valve: Secondary | ICD-10-CM

## 2018-11-23 NOTE — Progress Notes (Signed)
Cardiac Individual Treatment Plan  Patient Details  Name: James Cardenas MRN: [id number] Date of Birth: 07-12-1940 Referring Provider:     Cardiac Rehab from 10/18/2018 in [facility] Cardiac and Pulmonary Rehab  Referring Provider  Sherren Mocha MD      Initial Encounter Date:    Cardiac Rehab from 10/18/2018 in [facility] Cardiac and Pulmonary Rehab  Date  10/18/18      Visit Diagnosis: S/P TAVR (transcatheter aortic valve replacement)  Patient's Home Medications on Admission:  Current Outpatient Medications:  .  Alpha-Lipoic Acid 600 MG CAPS, Take 600 mg by mouth every morning. , Disp: , Rfl:  .  ALPRAZolam (XANAX) 0.5 MG tablet, Take 1 tablet (0.5 mg total) by mouth 2 (two) times daily as needed for anxiety., Disp: 60 tablet, Rfl: 2 .  amoxicillin (AMOXIL) 500 MG tablet, Take 2,000 mg (4 pills) one hour prior to all dental visits. There are enough for two visits in this bottle., Disp: 8 tablet, Rfl: 6 .  aspirin EC 81 MG tablet, Take 81 mg by mouth every evening. , Disp: , Rfl:  .  Azelaic Acid 15 % cream, Apply 1 application topically daily. After skin is thoroughly washed and patted dry, gently but thoroughly massage a thin film of azelaic acid cream into the affected area once daily, Disp: , Rfl:  .  Carboxymethylcellul-Glycerin (LUBRICATING EYE DROPS OP), Place 1 drop into both eyes 2 (two) times a day. , Disp: , Rfl:  .  cholecalciferol (VITAMIN D3) 25 MCG (1000 UT) tablet, Take 1,000 Units by mouth daily., Disp: , Rfl:  .  clopidogrel (PLAVIX) 75 MG tablet, Take 1 tablet (75 mg total) by mouth daily with breakfast., Disp: 90 tablet, Rfl: 1 .  mirabegron ER (MYRBETRIQ) 50 MG TB24 tablet, Take 1 tablet (50 mg total) by mouth daily., Disp: 90 tablet, Rfl: 3 .  Multiple Vitamin (MULTIVITAMIN WITH MINERALS) TABS tablet, Take 1 tablet by mouth daily at 3 pm. Centrum, Disp: , Rfl:  .  Omega-3 Fatty Acids (MINI FISH OIL PO), Take 2 capsules by mouth daily at 12 noon., Disp: , Rfl:  .  OVER THE  COUNTER MEDICATION, Take 1 tablet by mouth at bedtime as needed (sleep). GNC PREVENTIVE NUTRITION TRI-SLEEP, Disp: , Rfl:  .  polyethylene glycol (MIRALAX / GLYCOLAX) packet, Take 17 g by mouth 2 (two) times daily as needed (constipation). , Disp: , Rfl:  .  rosuvastatin (CRESTOR) 10 MG tablet, Take 1 tablet (10 mg total) by mouth daily., Disp: 90 tablet, Rfl: 3 .  sildenafil (REVATIO) 20 MG tablet, Take up to 5 pills about 30 min prior to sex, Disp: 50 tablet, Rfl: 8 .  telmisartan (MICARDIS) 80 MG tablet, Take 1 tablet (80 mg total) by mouth daily., Disp: 90 tablet, Rfl: 1 .  tretinoin (RETIN-A) 0.1 % cream, Apply 1 application topically at bedtime. , Disp: , Rfl: 0  Past Medical History: Past Medical History:  Diagnosis Date  . Anxiety   . Arthritis of knee   . Carotid artery disease ([place])   . 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 Coronary artery disease   . Hearing loss    wears hearing aids  . Hypertension   . Neuropathy   . S/P TAVR (transcatheter aortic valve replacement)    26 mm Edwards Sapien THV via the TF approach   . Severe aortic stenosis     Tobacco Use: Social History   Tobacco Use  Smoking Status  Never Smoker  Smokeless Tobacco Never Used    Labs: Recent Review Flowsheet Data    Labs for ITP Cardiac and Pulmonary Rehab Latest Ref Rng & Units 11/09/2015 09/29/2016 10/12/2017 09/09/2018 09/13/2018   Cholestrol <200 mg/dL - 171 - - -   LDLCALC <100 mg/dL - 90 - - -   HDL >40 mg/dL - 73 - - -   Trlycerides <150 mg/dL - 40 - - -   Hemoglobin A1c 4.8 - 5.6 % 5.4 5.4 5.5 5.5 -   PHART 7.350 - 7.450 - - - 7.468(H) 7.434   PCO2ART 32.0 - 48.0 mmHg - - - 33.8 33.3   HCO3 20.0 - 28.0 mmol/L - - - 24.2 22.3   TCO2 22 - 32 mmol/L - - - - 23   ACIDBASEDEF 0.0 - 2.0 mmol/L - - - - 1.0   O2SAT % - - - 98.6 99.0       Exercise Target Goals: Exercise Program Goal: Individual exercise prescription set using results from initial 6 min walk test and THRR while considering   patient's activity barriers and safety.   Exercise Prescription Goal: Initial exercise prescription builds to 30-45 minutes a day of aerobic activity, 2-3 days per week.  Home exercise guidelines will be given to patient during program as part of exercise prescription that the participant will acknowledge.  Activity Barriers & Risk Stratification: Activity Barriers & Cardiac Risk Stratification - 10/18/18 1507      Activity Barriers & Cardiac Risk Stratification   Activity Barriers  Deconditioning;Muscular Weakness;Other (comment)    Comments  neuropathy    Cardiac Risk Stratification  Low       6 Minute Walk: 6 Minute Walk    Row Name 10/18/18 1647         6 Minute Walk   Phase  Initial     Distance  1402 feet     Walk Time  6 minutes     # of Rest Breaks  0     MPH  2.66     METS  3.29     RPE  9     Perceived Dyspnea   1     VO2 Peak  11.53     Symptoms  No     Resting HR  61 bpm     Resting BP  134/72     Resting Oxygen Saturation   97 %     Exercise Oxygen Saturation  during 6 min walk  98 %     Max Ex. HR  109 bpm     Max Ex. BP  156/66     2 Minute Post BP  134/72 Recheck 134/70        Oxygen Initial Assessment:   Oxygen Re-Evaluation:   Oxygen Discharge (Final Oxygen Re-Evaluation):   Initial Exercise Prescription: Initial Exercise Prescription - 10/18/18 1500      Date of Initial Exercise RX and Referring Provider   Date  10/18/18    Referring Provider  Sherren Mocha MD      Treadmill   MPH  2.5    Grade  1    Minutes  15    METs  3.26      Recumbant Bike   Level  2    RPM  50    Watts  36    Minutes  15    METs  3.2      NuStep   Level  3  SPM  80    Minutes  15    METs  3.2      REL-XR   Level  2    Speed  50    Minutes  15    METs  3.2      T5 Nustep   Level  3    SPM  80    Minutes  15    METs  3.2      Prescription Details   Frequency (times per week)  3    Duration  Progress to 30 minutes of continuous  aerobic without signs/symptoms of physical distress      Intensity   THRR 40-80% of Max Heartrate  94-127    Ratings of Perceived Exertion  11-13    Perceived Dyspnea  0-4      Progression   Progression  Continue to progress workloads to maintain intensity without signs/symptoms of physical distress.      Resistance Training   Training Prescription  Yes    Weight  4 lbs    Reps  10-15       Perform Capillary Blood Glucose checks as needed.  Exercise Prescription Changes: Exercise Prescription Changes    Row Name 11/02/18 1500 11/07/18 1400 11/14/18 1200         Response to Exercise   Blood Pressure (Admit)  142/70  -  112/66     Blood Pressure (Exercise)  164/84  -  134/72     Blood Pressure (Exit)  146/70  -  122/58     Heart Rate (Admit)  67 bpm  -  68 bpm     Heart Rate (Exercise)  97 bpm  -  91 bpm     Heart Rate (Exit)  82 bpm  -  72 bpm     Rating of Perceived Exertion (Exercise)  14  -  14     Symptoms  none  -  none     Duration  Continue with 30 min of aerobic exercise without signs/symptoms of physical distress.  -  Continue with 30 min of aerobic exercise without signs/symptoms of physical distress.     Intensity  THRR unchanged  -  THRR unchanged       Progression   Progression  Continue to progress workloads to maintain intensity without signs/symptoms of physical distress.  -  Continue to progress workloads to maintain intensity without signs/symptoms of physical distress.     Average METs  3.3  -  2.81       Resistance Training   Training Prescription  Yes  -  Yes     Weight  5 lbs  -  6 lbs     Reps  10-15  -  10-15       Interval Training   Interval Training  No  -  No       Treadmill   MPH  2.7  -  2.7     Grade  1  -  1     Minutes  15  -  15     METs  3.44  -  3.44       Recumbant Bike   Level  2  -  3     Watts  25  -  29     Minutes  15  -  15     METs  2.87  -  3.11       REL-XR  Level  4  -  -     Minutes  15  -  -     METs  4.3   -  -       T5 Nustep   Level  3  -  3     Minutes  15  -  15     METs  2.6  -  2.7       Biostep-RELP   Level  -  -  2     Minutes  -  -  15     METs  -  -  3       Home Exercise Plan   Plans to continue exercise at  -  Home (comment) walking and biking  Home (comment) walking and biking     Frequency  -  Add 2 additional days to program exercise sessions.  Add 2 additional days to program exercise sessions.     Initial Home Exercises Provided  -  11/07/18  11/07/18        Exercise Comments: Exercise Comments    Row Name 10/20/18 1131           Exercise Comments  .First full day of exercise!  Patient was oriented to gym and equipment including functions, settings, policies, and procedures.  Patient's individual exercise prescription and treatment plan were reviewed.  All starting workloads were established based on the results of the 6 minute walk test done at initial orientation visit.  The plan for exercise progression was also introduced and progression will be customized based on patient's performance and goals.          Exercise Goals and Review: Exercise Goals    Row Name 10/04/18 1132             Exercise Goals   Increase Physical Activity  Yes       Intervention  Provide advice, education, support and counseling about physical activity/exercise needs.;Develop an individualized exercise prescription for aerobic and resistive training based on initial evaluation findings, risk stratification, comorbidities and participant's personal goals.       Expected Outcomes  Short Term: Attend rehab on a regular basis to increase amount of physical activity.;Long Term: Add in home exercise to make exercise part of routine and to increase amount of physical activity.;Long Term: Exercising regularly at least 3-5 days a week.       Increase Strength and Stamina  Yes       Intervention  Provide advice, education, support and counseling about physical activity/exercise  needs.;Develop an individualized exercise prescription for aerobic and resistive training based on initial evaluation findings, risk stratification, comorbidities and participant's personal goals.       Expected Outcomes  Short Term: Increase workloads from initial exercise prescription for resistance, speed, and METs.;Short Term: Perform resistance training exercises routinely during rehab and add in resistance training at home;Long Term: Improve cardiorespiratory fitness, muscular endurance and strength as measured by increased METs and functional capacity (6MWT)       Able to understand and use rate of perceived exertion (RPE) scale  Yes       Intervention  Provide education and explanation on how to use RPE scale       Expected Outcomes  Short Term: Able to use RPE daily in rehab to express subjective intensity level;Long Term:  Able to use RPE to guide intensity level when exercising independently       Able to understand and use Dyspnea  scale  Yes       Intervention  Provide education and explanation on how to use Dyspnea scale       Expected Outcomes  Short Term: Able to use Dyspnea scale daily in rehab to express subjective sense of shortness of breath during exertion;Long Term: Able to use Dyspnea scale to guide intensity level when exercising independently       Knowledge and understanding of Target Heart Rate Range (THRR)  Yes       Intervention  Provide education and explanation of THRR including how the numbers were predicted and where they are located for reference       Expected Outcomes  Short Term: Able to state/look up THRR;Short Term: Able to use daily as guideline for intensity in rehab;Long Term: Able to use THRR to govern intensity when exercising independently       Able to check pulse independently  Yes       Intervention  Provide education and demonstration on how to check pulse in carotid and radial arteries.;Review the importance of being able to check your own pulse for safety  during independent exercise       Expected Outcomes  Short Term: Able to explain why pulse checking is important during independent exercise;Long Term: Able to check pulse independently and accurately       Understanding of Exercise Prescription  Yes       Intervention  Provide education, explanation, and written materials on patient's individual exercise prescription       Expected Outcomes  Short Term: Able to explain program exercise prescription;Long Term: Able to explain home exercise prescription to exercise independently          Exercise Goals Re-Evaluation : Exercise Goals Re-Evaluation    Row Name 10/20/18 1132 11/02/18 1521 11/07/18 1425 11/14/18 1225 11/17/18 1531     Exercise Goal Re-Evaluation   Exercise Goals Review  Increase Physical Activity;Increase Strength and Stamina;Able to understand and use rate of perceived exertion (RPE) scale;Able to understand and use Dyspnea scale;Knowledge and understanding of Target Heart Rate Range (THRR);Understanding of Exercise Prescription  Increase Physical Activity;Increase Strength and Stamina;Understanding of Exercise Prescription  Increase Physical Activity;Increase Strength and Stamina;Understanding of Exercise Prescription  Increase Physical Activity;Increase Strength and Stamina;Understanding of Exercise Prescription  Understanding of Exercise Prescription;Able to understand and use rate of perceived exertion (RPE) scale;Increase Physical Activity   Comments  Reviewed RPE scale, THR and program prescription with pt today.  Pt voiced understanding and was given a copy of goals to take home.  Ron is off to a good start in rehab.  He already up to 2.7 mph on the treadmill.  We will continue to monitor his progress.  Ron is doing well with exercise. Ron is still walking at home some.  But now that he is in rehab consistently, he is having a harder time getting it in his schedule.  We talked about the importance getting in his cardio 5 days a  week. Updated home exercise with pt today.  Pt plans to walk and use bike at home for exercise.  Reviewed THR, pulse, RPE, sign and symptoms, and when to call 911 or MD.  Also discussed weather considerations and indoor options.  Pt voiced understanding.  Ron continues to do well in rehab.  He is up to 29 watts on the bike and 3 METs on the BioStep.  We will continue to monitor his progress.  ROn asked questions about METS. THat was explained to  him today and he verbalized understanding.  He also told staff he has been helping his friend with yardwork-increasing physical activity   Expected Outcomes  Short: Use RPE daily to regulate intensity. Long: Follow program prescription in THR.  Short: Continue to increase workloads.  Long: Continue to follow program prescription.  Short: Get back on bike at least two extra days a week.  Long: Continue to increase stamina.  Short: Increase workloads.  Long: Continue to increase strength and stamina.  Short: Increase workloads. Continue to learn about process of exercise prescription  Long: Continue to increase strength and stamina.      Discharge Exercise Prescription (Final Exercise Prescription Changes): Exercise Prescription Changes - 11/14/18 1200      Response to Exercise   Blood Pressure (Admit)  112/66    Blood Pressure (Exercise)  134/72    Blood Pressure (Exit)  122/58    Heart Rate (Admit)  68 bpm    Heart Rate (Exercise)  91 bpm    Heart Rate (Exit)  72 bpm    Rating of Perceived Exertion (Exercise)  14    Symptoms  none    Duration  Continue with 30 min of aerobic exercise without signs/symptoms of physical distress.    Intensity  THRR unchanged      Progression   Progression  Continue to progress workloads to maintain intensity without signs/symptoms of physical distress.    Average METs  2.81      Resistance Training   Training Prescription  Yes    Weight  6 lbs    Reps  10-15      Interval Training   Interval Training  No       Treadmill   MPH  2.7    Grade  1    Minutes  15    METs  3.44      Recumbant Bike   Level  3    Watts  29    Minutes  15    METs  3.11      T5 Nustep   Level  3    Minutes  15    METs  2.7      Biostep-RELP   Level  2    Minutes  15    METs  3      Home Exercise Plan   Plans to continue exercise at  Home (comment)   walking and biking   Frequency  Add 2 additional days to program exercise sessions.    Initial Home Exercises Provided  11/07/18       Nutrition:  Target Goals: Understanding of nutrition guidelines, daily intake of sodium '1500mg'$ , cholesterol '200mg'$ , calories 30% from fat and 7% or less from saturated fats, daily to have 5 or more servings of fruits and vegetables.  Biometrics: Pre Biometrics - 10/18/18 1510      Pre Biometrics   Height  6' 2.2" (1.885 m)    Weight  193 lb 4.8 oz (87.7 kg)    BMI (Calculated)  24.68        Nutrition Therapy Plan and Nutrition Goals: Nutrition Therapy & Goals - 10/03/18 1200      Nutrition Therapy   Diet  HH, low Na    Protein (specify units)  70g    Fiber  25 grams    Whole Grain Foods  3 servings    Saturated Fats  12 max. grams    Fruits and Vegetables  5 servings/day  Sodium  1.5 grams      Personal Nutrition Goals   Nutrition Goal  ST: continue HH eating, learn more about HH eating LT: maintian weight    Comments  Used to be very disciplined 5 years ago when his medical problems started. Tries to eat reasonably healthy and nowuses butter/olive oil combination, now eats whole wheat and whole grains, will have eggs with bacon 2x/week, banana with cereal (rice crispy and corn flakes together but sometimes will have raisin bran) and frozen fruit (like berries) x3x/week and will have applesauce on toast instead of jelly. Will normally eat B and D. Has a jar of peanuts for a snack and will have cuties or halos every once in a while. D: will grill steak every once in a while, chicken breast, frozen veggies  (steams) broccoli, brussell sprouts, sweet potatoes every once in a while, corn (not much). (meat or poultry with vegetable) once and a while will have fish (bake or broil) gets at [employer] but hasn't been over there with COVID and  doesn't use mayo uses low calorie miricle whip with tuna and toast. Will have some frozen pizza or fish sticks every once in a while. will have baked beans (don't like other beans). Doesn't fry much, tries to grill it, bake it, or broil it. will have graham crackers for a snack or some angel food cake and frozen yogurt. will has some mild constipation due to bladder pills. weight 185-189lbs, focus on goal to 182-184lbs - weighs regularly. Will use olive oil for most things, will eat greek salads and chef salads (regular dressings) and gets them out. Disscussed MyPlate and HH eating, answered all questions (corn, peas, whole wheat, gluten). Water consumption low, but will mix water with other things.      Intervention Plan   Intervention  Prescribe, educate and counsel regarding individualized specific dietary modifications aiming towards targeted core components such as weight, hypertension, lipid management, diabetes, heart failure and other comorbidities.;Nutrition handout(s) given to patient.    Expected Outcomes  Short Term Goal: A plan has been developed with personal nutrition goals set during dietitian appointment.;Long Term Goal: Adherence to prescribed nutrition plan.;Short Term Goal: Understand basic principles of dietary content, such as calories, fat, sodium, cholesterol and nutrients.       Nutrition Assessments: Nutrition Assessments - 10/18/18 1657      MEDFICTS Scores   Pre Score  21       Nutrition Goals Re-Evaluation: Nutrition Goals Re-Evaluation    Row Name 11/14/18 1437             Goals   Current Weight  192 lb 12.8 oz (87.5 kg)       Nutrition Goal  ST: continue HH eating, learn more about HH eating LT: maintian weight       Comment   Pt reports "goofing off" sometimes. Pt reports eating a variety of different fruits and vegetables and continues to be very health concious. Pt with no new goals at this time. Pt also recalls what we spoke about during our initial evaluation and regularly does research to [facility] sure eating healthy.       Expected Outcome  ST: continue HH eating, learn more about HH eating LT: maintian weight          Nutrition Goals Discharge (Final Nutrition Goals Re-Evaluation): Nutrition Goals Re-Evaluation - 11/14/18 1437      Goals   Current Weight  192 lb 12.8 oz (87.5 kg)  Nutrition Goal  ST: continue HH eating, learn more about HH eating LT: maintian weight    Comment  Pt reports "goofing off" sometimes. Pt reports eating a variety of different fruits and vegetables and continues to be very health concious. Pt with no new goals at this time. Pt also recalls what we spoke about during our initial evaluation and regularly does research to [facility] sure eating healthy.    Expected Outcome  ST: continue HH eating, learn more about HH eating LT: maintian weight       Psychosocial: Target Goals: Acknowledge presence or absence of significant depression and/or stress, maximize coping skills, provide positive support system. Participant is able to verbalize types and ability to use techniques and skills needed for reducing stress and depression.   Initial Review & Psychosocial Screening:   Quality of Life Scores:  Quality of Life - 10/18/18 1658      Quality of Life   Select  Quality of Life      Quality of Life Scores   Health/Function Pre  24.03 %    Socioeconomic Pre  23.43 %    Psych/Spiritual Pre  26.14 %    Family Pre  27.6 %    GLOBAL Pre  24.82 %      Scores of 19 and below usually indicate a poorer quality of life in these areas.  A difference of  2-3 points is a clinically meaningful difference.  A difference of 2-3 points in the total score of the Quality of Life Index has been  associated with significant improvement in overall quality of life, self-image, physical symptoms, and general health in studies assessing change in quality of life.  PHQ-9: Recent Review Flowsheet Data    Depression screen [facility] 2/9 04/19/2018 04/19/2018 11/23/2017 10/12/2017 04/16/2017   Decreased Interest 0 0 0 0 0   Down, Depressed, Hopeless 0 0 0 0 0   PHQ - 2 Score 0 0 0 0 0   Altered sleeping 0 - - - 0   Tired, decreased energy 0 - - - 0   Change in appetite 0 - - - 0   Feeling bad or failure about yourself  0 - - - 0   Trouble concentrating 0 - - - 0   Moving slowly or fidgety/restless 0 - - - 0   Suicidal thoughts 0 - - - 0   PHQ-9 Score 0 - - - 0   Difficult doing work/chores Not difficult at all - - - Not difficult at all     Interpretation of Total Score  Total Score Depression Severity:  1-4 = Minimal depression, 5-9 = Mild depression, 10-14 = Moderate depression, 15-19 = Moderately severe depression, 20-27 = Severe depression   Psychosocial Evaluation and Intervention:   Psychosocial Re-Evaluation: Psychosocial Re-Evaluation    Row Name 11/07/18 1452             Psychosocial Re-Evaluation   Current issues with  Current Stress Concerns       Comments  Ron is doing well in rehab.  He is in a good place mentally and stays busy in his retirement.  He barely has time for exercise on his off days will start to try to make more time.   He sleeps well most night.  Overall, he is generally postive.       Expected Outcomes  Short: Continue to find time to exercise.  Long: Continue to stay postive.       Interventions  Encouraged to attend Cardiac Rehabilitation for the exercise       Continue Psychosocial Services   Follow up required by staff          Psychosocial Discharge (Final Psychosocial Re-Evaluation): Psychosocial Re-Evaluation - 11/07/18 1452      Psychosocial Re-Evaluation   Current issues with  Current Stress Concerns    Comments  Ron is doing well in rehab.  He  is in a good place mentally and stays busy in his retirement.  He barely has time for exercise on his off days will start to try to make more time.   He sleeps well most night.  Overall, he is generally postive.    Expected Outcomes  Short: Continue to find time to exercise.  Long: Continue to stay postive.    Interventions  Encouraged to attend Cardiac Rehabilitation for the exercise    Continue Psychosocial Services   Follow up required by staff       Vocational Rehabilitation: Provide vocational rehab assistance to qualifying candidates.   Vocational Rehab Evaluation & Intervention:   Education: Education Goals: Education classes will be provided on a variety of topics geared toward better understanding of heart health and risk factor modification. Participant will state understanding/return demonstration of topics presented as noted by education test scores.  Learning Barriers/Preferences:   Education Topics:  AED/CPR: - Group verbal and written instruction with the use of models to demonstrate the basic use of the AED with the basic ABC's of resuscitation.   General Nutrition Guidelines/Fats and Fiber: -Group instruction provided by verbal, written material, models and posters to present the general guidelines for heart healthy nutrition. Gives an explanation and review of dietary fats and fiber.   Controlling Sodium/Reading Food Labels: -Group verbal and written material supporting the discussion of sodium use in heart healthy nutrition. Review and explanation with models, verbal and written materials for utilization of the food label.   Exercise Physiology & General Exercise Guidelines: - Group verbal and written instruction with models to review the exercise physiology of the cardiovascular system and associated critical values. Provides general exercise guidelines with specific guidelines to those with heart or lung disease.    Aerobic Exercise & Resistance Training: -  Gives group verbal and written instruction on the various components of exercise. Focuses on aerobic and resistive training programs and the benefits of this training and how to safely progress through these programs..   Flexibility, Balance, Mind/Body Relaxation: Provides group verbal/written instruction on the benefits of flexibility and balance training, including mind/body exercise modes such as yoga, pilates and tai chi.  Demonstration and skill practice provided.   Stress and Anxiety: - Provides group verbal and written instruction about the health risks of elevated stress and causes of high stress.  Discuss the correlation between heart/lung disease and anxiety and treatment options. Review healthy ways to manage with stress and anxiety.   Depression: - Provides group verbal and written instruction on the correlation between heart/lung disease and depressed mood, treatment options, and the stigmas associated with seeking treatment.   Anatomy & Physiology of the Heart: - Group verbal and written instruction and models provide basic cardiac anatomy and physiology, with the coronary electrical and arterial systems. Review of Valvular disease and Heart Failure   Cardiac Procedures: - Group verbal and written instruction to review commonly prescribed medications for heart disease. Reviews the medication, class of the drug, and side effects. Includes the steps to properly store meds and maintain the prescription regimen. (  beta blockers and nitrates)   Cardiac Medications I: - Group verbal and written instruction to review commonly prescribed medications for heart disease. Reviews the medication, class of the drug, and side effects. Includes the steps to properly store meds and maintain the prescription regimen.   Cardiac Medications II: -Group verbal and written instruction to review commonly prescribed medications for heart disease. Reviews the medication, class of the drug, and side  effects. (all other drug classes)    Go Sex-Intimacy & Heart Disease, Get SMART - Goal Setting: - Group verbal and written instruction through game format to discuss heart disease and the return to sexual intimacy. Provides group verbal and written material to discuss and apply goal setting through the application of the S.M.A.R.T. Method.   Other Matters of the Heart: - Provides group verbal, written materials and models to describe Stable Angina and Peripheral Artery. Includes description of the disease process and treatment options available to the cardiac patient.   Exercise & Equipment Safety: - Individual verbal instruction and demonstration of equipment use and safety with use of the equipment.   Cardiac Rehab from 10/18/2018 in [facility] Cardiac and Pulmonary Rehab  Date  10/18/18  Educator  [facility]  Instruction Review Code  1- Verbalizes Understanding      Infection Prevention: - Provides verbal and written material to individual with discussion of infection control including proper hand washing and proper equipment cleaning during exercise session.   Cardiac Rehab from 10/18/2018 in [facility] Cardiac and Pulmonary Rehab  Date  10/18/18  Educator  [facility]  Instruction Review Code  1- Verbalizes Understanding      Falls Prevention: - Provides verbal and written material to individual with discussion of falls prevention and safety.   Cardiac Rehab from 10/18/2018 in [facility] Cardiac and Pulmonary Rehab  Date  10/18/18  Educator  [facility]  Instruction Review Code  1- Verbalizes Understanding      Diabetes: - Individual verbal and written instruction to review signs/symptoms of diabetes, desired ranges of glucose level fasting, after meals and with exercise. Acknowledge that pre and post exercise glucose checks will be done for 3 sessions at entry of program.   Know Your Numbers and Risk Factors: -Group verbal and written instruction about important numbers in your health.  Discussion of what are risk  factors and how they play a role in the disease process.  Review of Cholesterol, Blood Pressure, Diabetes, and BMI and the role they play in your overall health.   Sleep Hygiene: -Provides group verbal and written instruction about how sleep can affect your health.  Define sleep hygiene, discuss sleep cycles and impact of sleep habits. Review good sleep hygiene tips.    Other: -Provides group and verbal instruction on various topics (see comments)   Knowledge Questionnaire Score: Knowledge Questionnaire Score - 10/18/18 1657      Knowledge Questionnaire Score   Pre Score  24/26   Education Focus: Exercise and Nutrition      Core Components/Risk Factors/Patient Goals at Admission: Personal Goals and Risk Factors at Admission - 10/18/18 1510      Core Components/Risk Factors/Patient Goals on Admission    Weight Management  Yes;Weight Maintenance    Intervention  Weight Management: Develop a combined nutrition and exercise program designed to reach desired caloric intake, while maintaining appropriate intake of nutrient and fiber, sodium and fats, and appropriate energy expenditure required for the weight goal.;Weight Management: Provide education and appropriate resources to help participant work on and attain dietary goals.  Admit Weight  193 lb 4.8 oz (87.7 kg)    Goal Weight: Short Term  193 lb (87.5 kg)    Goal Weight: Long Term  193 lb (87.5 kg)    Expected Outcomes  Short Term: Continue to assess and modify interventions until short term weight is achieved;Long Term: Adherence to nutrition and physical activity/exercise program aimed toward attainment of established weight goal;Weight Maintenance: Understanding of the daily nutrition guidelines, which includes 25-35% calories from fat, 7% or less cal from saturated fats, less than '200mg'$  cholesterol, less than 1.5gm of sodium, & 5 or more servings of fruits and vegetables daily    Heart Failure  Yes    Intervention  Provide a  combined exercise and nutrition program that is supplemented with education, support and counseling about heart failure. Directed toward relieving symptoms such as shortness of breath, decreased exercise tolerance, and extremity edema.    Expected Outcomes  Improve functional capacity of life;Short term: Attendance in program 2-3 days a week with increased exercise capacity. Reported lower sodium intake. Reported increased fruit and vegetable intake. Reports medication compliance.;Short term: Daily weights obtained and reported for increase. Utilizing diuretic protocols set by physician.;Long term: Adoption of self-care skills and reduction of barriers for early signs and symptoms recognition and intervention leading to self-care maintenance.    Hypertension  Yes    Intervention  Provide education on lifestyle modifcations including regular physical activity/exercise, weight management, moderate sodium restriction and increased consumption of fresh fruit, vegetables, and low fat dairy, alcohol moderation, and smoking cessation.;Monitor prescription use compliance.    Expected Outcomes  Short Term: Continued assessment and intervention until BP is < 140/3m HG in hypertensive participants. < 130/812mHG in hypertensive participants with diabetes, heart failure or chronic kidney disease.;Long Term: Maintenance of blood pressure at goal levels.       Core Components/Risk Factors/Patient Goals Review:  Goals and Risk Factor Review    Row Name 11/07/18 1454             Core Components/Risk Factors/Patient Goals Review   Personal Goals Review  Weight Management/Obesity;Hypertension;Heart Failure       Review  Ron is doing well and staying steady around 180 lbs with his pounds.  He denies any symptoms of heart failure.  He is doing well with his blood pressures overall.       Expected Outcomes  Short: Continue to monitor weight.  Long: Continue to monitor risk factors.          Core Components/Risk  Factors/Patient Goals at Discharge (Final Review):  Goals and Risk Factor Review - 11/07/18 1454      Core Components/Risk Factors/Patient Goals Review   Personal Goals Review  Weight Management/Obesity;Hypertension;Heart Failure    Review  Ron is doing well and staying steady around 180 lbs with his pounds.  He denies any symptoms of heart failure.  He is doing well with his blood pressures overall.    Expected Outcomes  Short: Continue to monitor weight.  Long: Continue to monitor risk factors.       ITP Comments: ITP Comments    Row Name 09/27/18 1610 10/03/18 1259 10/18/18 1506 10/26/18 1421 11/23/18 0640   ITP Comments  Virtual Cardiac Rehab visit completed. Consent and Intake completed.  Appointments made for EP and RD  nutrition consult completed  Completed intial 6MWT and completed intake.  [facility]  ITP created and sent for review to medical director,  Dr. Emily Filbert.  30 day review cycle restarting  after being closed since March 16 because of  Covid 19 pandemic. Program opened to patients on July 6. Not all have returned. ITP updated and sent to Medical Director for review,changes as needed and signature  30 Day Review Completed today. Continue with ITP unless changed by Medical Director review.      Comments:

## 2018-11-24 ENCOUNTER — Encounter: Payer: Medicare Other | Admitting: *Deleted

## 2018-11-24 ENCOUNTER — Other Ambulatory Visit: Payer: Self-pay

## 2018-11-24 ENCOUNTER — Other Ambulatory Visit: Payer: Medicare Other

## 2018-11-24 DIAGNOSIS — Z952 Presence of prosthetic heart valve: Secondary | ICD-10-CM

## 2018-11-24 DIAGNOSIS — E559 Vitamin D deficiency, unspecified: Secondary | ICD-10-CM | POA: Diagnosis not present

## 2018-11-24 DIAGNOSIS — N400 Enlarged prostate without lower urinary tract symptoms: Secondary | ICD-10-CM | POA: Diagnosis not present

## 2018-11-24 DIAGNOSIS — R7309 Other abnormal glucose: Secondary | ICD-10-CM

## 2018-11-24 DIAGNOSIS — I7090 Unspecified atherosclerosis: Secondary | ICD-10-CM | POA: Diagnosis not present

## 2018-11-24 NOTE — Progress Notes (Signed)
Daily Session Note  Patient Details  Name: James Cardenas MRN: [id number] Date of Birth: 1941/01/11 Referring Provider:     Cardiac Rehab from 10/18/2018 in [facility] Cardiac and Pulmonary Rehab  Referring Provider  Sherren Mocha MD      Encounter Date: 11/24/2018  Check In: Session Check In - 11/24/18 1431      Check-In   Supervising physician immediately available to respond to emergencies  See telemetry face sheet for immediately available ER MD    Location  [facility]-Cardiac & Pulmonary Rehab    Staff Present  Renita Papa, RN BSN;Jessica Luan Pulling, MA, RCEP, CCRP, CCET;Joseph Sealy RCP,RRT,BSRT    Virtual Visit  No    Medication changes reported      No    Fall or balance concerns reported     No    Warm-up and Cool-down  Performed on first and last piece of equipment    Resistance Training Performed  Yes    VAD Patient?  No    PAD/SET Patient?  No      Pain Assessment   Currently in Pain?  No/denies          Social History   Tobacco Use  Smoking Status Never Smoker  Smokeless Tobacco Never Used    Goals Met:  Independence with exercise equipment Exercise tolerated well No report of cardiac concerns or symptoms Strength training completed today  Goals Unmet:  Not Applicable  Comments: Pt able to follow exercise prescription today without complaint.  Will continue to monitor for progression.    Dr. Emily Filbert is Medical Director for [facility].

## 2018-11-25 ENCOUNTER — Encounter: Payer: Self-pay | Admitting: Family Medicine

## 2018-11-25 DIAGNOSIS — R7309 Other abnormal glucose: Secondary | ICD-10-CM | POA: Insufficient documentation

## 2018-11-25 LAB — LIPID PANEL
Cholesterol: 146 mg/dL (ref <200)
HDL: 71 mg/dL (ref >=40)
LDL Cholesterol (Calc): 61 mg/dL (calc)
Non-HDL Cholesterol (Calc): 75 mg/dL (calc) (ref <130)
Total CHOL/HDL Ratio: 2.1 (calc) (ref <5.0)
Triglycerides: 48 mg/dL (ref <150)

## 2018-11-25 LAB — HEMOGLOBIN A1C
Hgb A1c MFr Bld: 5.6 % of total Hgb (ref <5.7)
Mean Plasma Glucose: 114 (calc)
eAG (mmol/L): 6.3 (calc)

## 2018-11-25 LAB — VITAMIN D 25 HYDROXY (VIT D DEFICIENCY, FRACTURES): Vit D, 25-Hydroxy: 33 ng/mL (ref 30–100)

## 2018-11-25 LAB — PSA: PSA: 2.7 ng/mL (ref <=4.0)

## 2018-11-28 ENCOUNTER — Other Ambulatory Visit: Payer: Self-pay

## 2018-11-28 ENCOUNTER — Encounter: Payer: Medicare Other | Admitting: *Deleted

## 2018-11-28 DIAGNOSIS — Z952 Presence of prosthetic heart valve: Secondary | ICD-10-CM

## 2018-11-28 NOTE — Progress Notes (Signed)
Daily Session Note  Patient Details  Name: ORVAN PAPADAKIS MRN: [id number] Date of Birth: 06-05-1940 Referring Provider:     Cardiac Rehab from 10/18/2018 in [facility] Cardiac and Pulmonary Rehab  Referring Provider  Sherren Mocha MD      Encounter Date: 11/28/2018  Check In: Session Check In - 11/28/18 1435      Check-In   Supervising physician immediately available to respond to emergencies  See telemetry face sheet for immediately available ER MD    Location  [facility]-Cardiac & Pulmonary Rehab    Staff Present  Heath Lark, RN, BSN, CCRP;Jeanna Durrell BS, Exercise Physiologist    Virtual Visit  No    Medication changes reported      No    Fall or balance concerns reported     No    Warm-up and Cool-down  Performed on first and last piece of equipment    Resistance Training Performed  Yes    VAD Patient?  No    PAD/SET Patient?  No      Pain Assessment   Currently in Pain?  No/denies          Social History   Tobacco Use  Smoking Status Never Smoker  Smokeless Tobacco Never Used    Goals Met:  Independence with exercise equipment Exercise tolerated well Personal goals reviewed No report of cardiac concerns or symptoms Strength training completed today  Goals Unmet:  Not Applicable  Comments: Pt able to follow exercise prescription today without complaint.  Will continue to monitor for progression.    Dr. Emily Filbert is Medical Director for [facility].

## 2018-11-28 NOTE — Progress Notes (Signed)
Subjective:   James Cardenas is a 78 y.o. male who presents for Medicare Annual/Subsequent preventive examination.  This visit is being conducted via phone call  - after an attmept to do on video chat - due to the COVID-19 pandemic. This patient has given me verbal consent via phone to conduct this visit, patient states they are participating from their home address. Some vital signs may be absent or patient reported.   Patient identification: identified by name, DOB, and current address.    Review of Systems:         Objective:    Vitals: BP 125/67   Pulse 63   Resp 16   Ht 6\' 2"  (1.88 m)   Wt 197 lb (89.4 kg)   SpO2 100%   BMI 25.29 kg/m   Body mass index is 25.29 kg/m.  Advanced Directives 09/13/2018 09/09/2018 09/06/2018 07/28/2018 07/26/2018 01/17/2018 11/23/2017  Does Patient Have a Medical Advance Directive? Yes - Yes Yes Yes Yes Yes  Type of Paramedic of [place];Living will Healthcare Power of [place];Living will Living will [place];Living will - [place];Living will  Does patient want to make changes to medical advance directive? No - Patient declined - - No - Patient declined - - -  Copy of [place] in Chart? No - copy requested - Yes - validated most recent copy scanned in chart (See row information) - - - No - copy requested    Tobacco Social History   Tobacco Use  Smoking Status Never Smoker  Smokeless Tobacco Never Used     Counseling given: Not Answered   Clinical Intake:  Pre-visit preparation completed: Yes  Pain : No/denies pain     Nutritional Risks: None Diabetes: No  How often do you need to have someone help you when you read instructions, pamphlets, or other written materials from your doctor or pharmacy?: 1 - Never  Interpreter Needed?: No  Information entered by :: Tiffany Hill,LPN  Past Medical History:  Diagnosis  Date  . Anxiety   . Arthritis of knee   . Carotid artery disease ([place])   . 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 Coronary artery disease   . Hearing loss    wears hearing aids  . Hypertension   . Neuropathy   . S/P TAVR (transcatheter aortic valve replacement)    26 mm Edwards Sapien THV via the TF approach   . Severe aortic stenosis    Past Surgical History:  Procedure Laterality Date  . COLONOSCOPY    . EYE SURGERY Bilateral    cataract  . HERNIA REPAIR Left    Inguinal  . NECK SURGERY  2011   Cervical Fusion  . RIGHT HEART CATH AND CORONARY ANGIOGRAPHY N/A 07/28/2018   Procedure: RIGHT HEART CATH AND CORONARY ANGIOGRAPHY;  Surgeon: Minna Merritts, MD;  Location: [facility];  Service: Cardiovascular;  Laterality: N/A;  . TEE WITHOUT CARDIOVERSION N/A 09/13/2018   Procedure: TRANSESOPHAGEAL ECHOCARDIOGRAM (TEE);  Surgeon: Sherren Mocha, MD;  Location: [facility];  Service: Open Heart Surgery;  Laterality: N/A;  . TRANSCATHETER AORTIC VALVE REPLACEMENT, TRANSFEMORAL  09/13/2018  . TRANSCATHETER AORTIC VALVE REPLACEMENT, TRANSFEMORAL N/A 09/13/2018   Procedure: TRANSCATHETER AORTIC VALVE REPLACEMENT, TRANSFEMORAL;  Surgeon: Sherren Mocha, MD;  Location: [facility];  Service: Open Heart Surgery;  Laterality: N/A;   Family History  Problem Relation Age of  Onset  . Thyroid disease Mother   . Lung cancer Father   . Pancreatic cancer Brother   . Alzheimer's disease Sister   . Prostate cancer Neg Hx   . Bladder Cancer Neg Hx   . Kidney cancer Neg Hx    Social History   Socioeconomic History  . Marital status: Divorced    Spouse name: Not on file  . Number of children: Not on file  . Years of education: Not on file  . Highest education level: Bachelor's degree (e.g., BA, AB, BS)  Occupational History  . Occupation: Retired Designer, multimedia, Public affairs consultant / heat treating)  Social Needs  . Financial resource strain: Not hard at all  . Food insecurity    Worry: Never true     Inability: Never true  . Transportation needs    Medical: No    Non-medical: No  Tobacco Use  . Smoking status: Never Smoker  . Smokeless tobacco: Never Used  Substance and Sexual Activity  . Alcohol use: Yes    Alcohol/week: 7.0 standard drinks    Types: 1 Cans of beer, 6 Glasses of wine per week  . Drug use: No  . Sexual activity: Yes  Lifestyle  . Physical activity    Days per week: 3 days    Minutes per session: 30 min  . Stress: Not at all  Relationships  . Social connections    Talks on phone: More than three times a week    Gets together: More than three times a week    Attends religious service: More than 4 times per year    Active member of club or organization: No    Attends meetings of clubs or organizations: Never    Relationship status: Divorced  Other Topics Concern  . Not on file  Social History Narrative  . Not on file    Outpatient Encounter Medications as of 11/29/2018  Medication Sig  . Alpha-Lipoic Acid 600 MG CAPS Take 600 mg by mouth every morning.   Marland Kitchen ALPRAZolam (XANAX) 0.5 MG tablet Take 1 tablet (0.5 mg total) by mouth 2 (two) times daily as needed for anxiety.  Marland Kitchen amoxicillin (AMOXIL) 500 MG tablet Take 2,000 mg (4 pills) one hour prior to all dental visits. There are enough for two visits in this bottle.  Marland Kitchen aspirin EC 81 MG tablet Take 81 mg by mouth every evening.   . Azelaic Acid 15 % cream Apply 1 application topically daily. After skin is thoroughly washed and patted dry, gently but thoroughly massage a thin film of azelaic acid cream into the affected area once daily  . Carboxymethylcellul-Glycerin (LUBRICATING EYE DROPS OP) Place 1 drop into both eyes 2 (two) times a day.   . cholecalciferol (VITAMIN D3) 25 MCG (1000 UT) tablet Take 1,000 Units by mouth daily.  . clopidogrel (PLAVIX) 75 MG tablet Take 1 tablet (75 mg total) by mouth daily with breakfast.  . mirabegron ER (MYRBETRIQ) 50 MG TB24 tablet Take 1 tablet (50 mg total) by mouth  daily.  . Multiple Vitamin (MULTIVITAMIN WITH MINERALS) TABS tablet Take 1 tablet by mouth daily at 3 pm. Centrum  . OVER THE COUNTER MEDICATION Take 1 tablet by mouth at bedtime as needed (sleep). GNC PREVENTIVE NUTRITION TRI-SLEEP  . polyethylene glycol (MIRALAX / GLYCOLAX) packet Take 17 g by mouth 2 (two) times daily as needed (constipation).   . rosuvastatin (CRESTOR) 10 MG tablet Take 1 tablet (10 mg total) by mouth daily.  . sildenafil (  REVATIO) 20 MG tablet Take up to 5 pills about 30 min prior to sex  . telmisartan (MICARDIS) 80 MG tablet Take 1 tablet (80 mg total) by mouth daily.  Marland Kitchen tretinoin (RETIN-A) 0.1 % cream Apply 1 application topically at bedtime.   . [DISCONTINUED] Omega-3 Fatty Acids (MINI FISH OIL PO) Take 2 capsules by mouth daily at 12 noon.   No facility-administered encounter medications on file as of 11/29/2018.     Activities of Daily Living In your present state of health, do you have any difficulty performing the following activities: 11/29/2018 11/29/2018  Hearing? N N  Comment bilareal hearing aids- dont wear them frequently -  Vision? N N  Comment no eye glasses, goes to [facility] -  Difficulty concentrating or making decisions? N N  Walking or climbing stairs? N N  Dressing or bathing? N N  Doing errands, shopping? N N  Preparing Food and eating ? N -  Using the Toilet? N -  In the past six months, have you accidently leaked urine? N -  Do you have problems with loss of bowel control? N -  Managing your Medications? N -  Managing your Finances? N -  Housekeeping or managing your Housekeeping? N -  Some recent data might be hidden    Patient Care Team: Olin Hauser, DO as PCP - General (Family Medicine) Renata Caprice as Physician Assistant (Orthopedic Surgery) Nickie Retort, MD as Consulting Physician (Urology) Gardiner Barefoot, DPM as Consulting Physician (Podiatry) Stark Klein Bing Neighbors, NP as Nurse  Practitioner (Neurology)   Assessment:   This is a routine wellness examination for [place].  Exercise Activities and Dietary recommendations Current Exercise Habits: Home exercise routine;Structured exercise class, Frequency (Times/Week): 3, Intensity: Mild  Goals    . Increase water intake     Recommend drinking at least 6-8 glasses of water a day        Fall Risk: Fall Risk  11/29/2018 10/18/2018 04/19/2018 11/23/2017 10/12/2017  Falls in the past year? 0 0 0 No No  Number falls in past yr: 0 0 - - -  Injury with Fall? 0 - - - -  Follow up - - Falls evaluation completed - -    FALL RISK PREVENTION PERTAINING TO THE HOME:  Any stairs in or around the home? Yes  If so, are there any without handrails? No   Home free of loose throw rugs in walkways, pet beds, electrical cords, etc? Yes  Adequate lighting in your home to reduce risk of falls? Yes   ASSISTIVE DEVICES UTILIZED TO PREVENT FALLS:  Life alert? No  Use of a cane, walker or w/c? No  Grab bars in the bathroom? No  Shower chair or bench in shower? No  Elevated toilet seat or a handicapped toilet? No   TIMED UP AND GO:  Unable to perform   Depression Screen PHQ 2/9 Scores 11/29/2018 04/19/2018 04/19/2018 11/23/2017  PHQ - 2 Score 0 0 0 0  PHQ- 9 Score 0 0 - -    Cognitive Function     6CIT Screen 11/23/2017 09/29/2016  What Year? 0 points 0 points  What month? 0 points 0 points  What time? 0 points 0 points  Count back from 20 0 points 0 points  Months in reverse 0 points 0 points  Repeat phrase 2 points 0 points  Total Score 2 0    Immunization History  Administered Date(s) Administered  . Influenza, High Dose  Seasonal PF 02/12/2017, 12/31/2017  . Influenza-Unspecified 11/12/2015  . Pneumococcal Conjugate-13 07/02/2016, 12/31/2017  . Pneumococcal Polysaccharide-23 05/24/2008  . Tetanus 08/01/2010  . Zoster 04/13/2009    Qualifies for Shingles Vaccine? Yes  Zostavax completed. Due for Shingrix. Education  has been provided regarding the importance of this vaccine. Pt has been advised to call insurance company to determine out of pocket expense. Advised may also receive vaccine at local pharmacy or Health Dept. Verbalized acceptance and understanding.  Tdap: up to date  Flu Vaccine: Due 12/2018  Pneumococcal Vaccine: up to date  Screening Tests Health Maintenance  Topic Date Due  . INFLUENZA VACCINE  11/12/2018  . TETANUS/TDAP  07/31/2020  . PNA vac Low Risk Adult  Completed   Cancer Screenings:  Colorectal Screening: no longer required   Lung Cancer Screening: (Low Dose CT Chest recommended if Age 37-80 years, 30 pack-year currently smoking OR have quit w/in 15years.) does not qualify.    Additional Screening:  Hepatitis C Screening: does not qualify  Vision Screening: Recommended annual ophthalmology exams for early detection of glaucoma and other disorders of the eye. Is the patient up to date with their annual eye exam?  Yes  Who is the provider or what is the name of the office in which the pt attends annual eye exams? [facility]    Dental Screening: Recommended annual dental exams for proper oral hygiene  Community Resource Referral:  CRR required this visit?  No        Plan:  I have personally reviewed and addressed the Medicare Annual Wellness questionnaire and have noted the following in the patient's chart:  A. Medical and social history B. Use of alcohol, tobacco or illicit drugs  C. Current medications and supplements D. Functional ability and status E.  Nutritional status F.  Physical activity G. Advance directives H. List of other physicians I.  Hospitalizations, surgeries, and ER visits in previous 12 months J.  [place] such as hearing and vision if needed, cognitive and depression L. Referrals and appointments   In addition, I have reviewed and discussed with patient certain preventive protocols, quality metrics, and best  practice recommendations. A written personalized care plan for preventive services as well as general preventive health recommendations were provided to patient.   Signed,   Bevelyn Ngo, LPN  [id number] Nurse Health Advisor   Nurse Notes: discussed pneumococcal vaccines with patient, it appears he received prevnar 13 2 years in a row(he got one at our office in 2018 and didn't realize it and got one at [facility] in 2019 and was told to come back this year for his booster), he is wondering if he still needs to get the pneumovax 23 done this year? It also appears he had a 23 done in 2010. Please advise. I informed pt id call him back with information.

## 2018-11-29 ENCOUNTER — Ambulatory Visit (INDEPENDENT_AMBULATORY_CARE_PROVIDER_SITE_OTHER): Payer: Medicare Other

## 2018-11-29 ENCOUNTER — Ambulatory Visit (INDEPENDENT_AMBULATORY_CARE_PROVIDER_SITE_OTHER): Payer: Medicare Other | Admitting: Family Medicine

## 2018-11-29 ENCOUNTER — Encounter: Payer: Self-pay | Admitting: Family Medicine

## 2018-11-29 VITALS — BP 125/67 | HR 63 | Resp 16 | Ht 74.0 in | Wt 197.0 lb

## 2018-11-29 DIAGNOSIS — R7309 Other abnormal glucose: Secondary | ICD-10-CM

## 2018-11-29 DIAGNOSIS — N400 Enlarged prostate without lower urinary tract symptoms: Secondary | ICD-10-CM

## 2018-11-29 DIAGNOSIS — Z952 Presence of prosthetic heart valve: Secondary | ICD-10-CM | POA: Diagnosis not present

## 2018-11-29 DIAGNOSIS — I10 Essential (primary) hypertension: Secondary | ICD-10-CM

## 2018-11-29 DIAGNOSIS — D472 Monoclonal gammopathy: Secondary | ICD-10-CM | POA: Diagnosis not present

## 2018-11-29 DIAGNOSIS — G609 Hereditary and idiopathic neuropathy, unspecified: Secondary | ICD-10-CM

## 2018-11-29 DIAGNOSIS — I272 Pulmonary hypertension, unspecified: Secondary | ICD-10-CM

## 2018-11-29 DIAGNOSIS — F418 Other specified anxiety disorders: Secondary | ICD-10-CM

## 2018-11-29 DIAGNOSIS — Z0000 Encounter for general adult medical examination without abnormal findings: Secondary | ICD-10-CM | POA: Diagnosis not present

## 2018-11-29 NOTE — Progress Notes (Signed)
Subjective:    Patient ID: James Cardenas, male    DOB: 05/22/1940, 78 y.o.   MRN: [id number]  James Cardenas is a 78 y.o. male presenting on 11/29/2018 for Peripheral Neuropathy   HPI   Patient has had Annual medicare wellness with [facility] L[facility] today.  FOLLOW-UPChronic Anxiety, Situational: Last visit 10/2017 see background information, at that time signed controlled non opioid agreement He is doing well on Alprazolam, has plenty left, expires 10/2018, rarely takes, he has never had dependence or withdrawal - see prior visit for other SSRI SNRI meds tried and failed w/ side effects, also with buspar - He only takes Alprazolam (Xanax) 0.5mg  BID PRN only situational anxiety, often he can go weeks without taking it, but certain circumstances, doctors visit dentist visit and certain trips he may need it. He was given prior rx 60 pills with 2 refills has lasted 1 year approximately. He describes panic type symptoms if this flares up. - He denies ever having any withdrawal from these medications before - Denies any depression or sad mood  Peripheral Neuropathy, Chronic Previous followed by Vascular / Neurology Off Gabapentin.  MGUS Followed by [facility] Heme/Onc Dr Tasia Catchings, advised for q 6 month lab monitoring surveillance for IgG Kappa MGUS as a pre-myeloma state.  S/p TAVR / Aortic Stenosis / regurgitation, Moderate / Heart Murmur Followed by Cardiothoracic Surgery/Cardiology On cardiac rehab currently Overall significant improvement from surgery He is doing cardiac rehab - accelerated down to 24 visits easy bruising bleeding, on anticoagulation for 1 year post op then off Improved edema now Denies shortness of breath, chest pain, dizziness, near syncope  CHRONIC HTN with white coat hypertension Taking Telmisartan 80mg  wants to switch back to Losartan 100, not quite as strong he believes, will wait until run low then check with pharmacy on availability, due to back order Home readings  normal 110-120/60-70s on avg reviewed home record, attributed to situational anxiety  Denies CP, dyspnea, HA, edema, dizziness / lightheadedness  OAB / Urinary Urge Followed by [facility] - he was given Myrbetriq 25mg  daily worked well and then seemed to be less effective, then they have increased it up to 50mg  with better results again now he questions what to do next if this doesn't work, he has apt with them in follow-up  Elevated A1c Last lab A1c 5.6, previously 5.4 to 5.5 Diet he admits some sweets, he tried to improve to frozen yogurt, he is interested to keep improving  HYPERLIPIDEMIA: - Reports no concerns. Last lipid panel 11/2018, controlled  - Currently taking Rosuvastatin 10mg , tolerating well without side effects or myalgias - On Fish Oil supplement, 2g daily he will plan to DC now  Health Maintenance: UTD Pneumonia vaccine, TDAP  Depression screen [facility] 2/9 11/29/2018 04/19/2018 04/19/2018  Decreased Interest 0 0 0  Down, Depressed, Hopeless 0 0 0  PHQ - 2 Score 0 0 0  Altered sleeping 0 0 -  Tired, decreased energy 0 0 -  Change in appetite 0 0 -  Feeling bad or failure about yourself  0 0 -  Trouble concentrating 0 0 -  Moving slowly or fidgety/restless 0 0 -  Suicidal thoughts 0 0 -  PHQ-9 Score 0 0 -  Difficult doing work/chores Not difficult at all Not difficult at all -    Past Medical History:  Diagnosis Date  . Anxiety   . Arthritis of knee   . Carotid artery disease ([place])   . Constipation  due to Myrbetriq-    . Coronary artery disease   . Hearing loss    wears hearing aids  . Hypertension   . Neuropathy   . S/P TAVR (transcatheter aortic valve replacement)    26 mm Edwards Sapien THV via the TF approach   . Severe aortic stenosis    Past Surgical History:  Procedure Laterality Date  . COLONOSCOPY    . EYE SURGERY Bilateral    cataract  . HERNIA REPAIR Left    Inguinal  . NECK SURGERY  2011   Cervical Fusion  . RIGHT HEART CATH AND CORONARY  ANGIOGRAPHY N/A 07/28/2018   Procedure: RIGHT HEART CATH AND CORONARY ANGIOGRAPHY;  Surgeon: Minna Merritts, MD;  Location: [place] [facility];  Service: Cardiovascular;  Laterality: N/A;  . TEE WITHOUT CARDIOVERSION N/A 09/13/2018   Procedure: TRANSESOPHAGEAL ECHOCARDIOGRAM (TEE);  Surgeon: Sherren Mocha, MD;  Location: [place];  Service: Open Heart Surgery;  Laterality: N/A;  . TRANSCATHETER AORTIC VALVE REPLACEMENT, TRANSFEMORAL  09/13/2018  . TRANSCATHETER AORTIC VALVE REPLACEMENT, TRANSFEMORAL N/A 09/13/2018   Procedure: TRANSCATHETER AORTIC VALVE REPLACEMENT, TRANSFEMORAL;  Surgeon: Sherren Mocha, MD;  Location: [place];  Service: Open Heart Surgery;  Laterality: N/A;   Social History   Socioeconomic History  . Marital status: Divorced    Spouse name: Not on file  . Number of children: Not on file  . Years of education: Not on file  . Highest education level: Bachelor's degree (e.g., BA, AB, BS)  Occupational History  . Occupation: Retired Designer, multimedia, Public affairs consultant / heat treating)  Social Needs  . Financial resource strain: Not hard at all  . Food insecurity    Worry: Never true    Inability: Never true  . Transportation needs    Medical: No    Non-medical: No  Tobacco Use  . Smoking status: Never Smoker  . Smokeless tobacco: Never Used  Substance and Sexual Activity  . Alcohol use: Yes    Alcohol/week: 7.0 standard drinks    Types: 1 Cans of beer, 6 Glasses of wine per week  . Drug use: No  . Sexual activity: Yes  Lifestyle  . Physical activity    Days per week: 3 days    Minutes per session: 30 min  . Stress: Not at all  Relationships  . Social connections    Talks on phone: More than three times a week    Gets together: More than three times a week    Attends religious service: More than 4 times per year    Active member of club or organization: No    Attends meetings of clubs or organizations: Never    Relationship status: Divorced  . Intimate partner  violence    Fear of current or ex partner: No    Emotionally abused: No    Physically abused: No    Forced sexual activity: No  Other Topics Concern  . Not on file  Social History Narrative  . Not on file   Family History  Problem Relation Age of Onset  . Thyroid disease Mother   . Lung cancer Father   . Pancreatic cancer Brother   . Alzheimer's disease Sister   . Prostate cancer Neg Hx   . Bladder Cancer Neg Hx   . Kidney cancer Neg Hx    Current Outpatient Medications on File Prior to Visit  Medication Sig  . Alpha-Lipoic Acid 600 MG CAPS Take 600 mg by mouth every morning.   Marland Kitchen  ALPRAZolam (XANAX) 0.5 MG tablet Take 1 tablet (0.5 mg total) by mouth 2 (two) times daily as needed for anxiety.  Marland Kitchen amoxicillin (AMOXIL) 500 MG tablet Take 2,000 mg (4 pills) one hour prior to all dental visits. There are enough for two visits in this bottle.  Marland Kitchen aspirin EC 81 MG tablet Take 81 mg by mouth every evening.   . Azelaic Acid 15 % cream Apply 1 application topically daily. After skin is thoroughly washed and patted dry, gently but thoroughly massage a thin film of azelaic acid cream into the affected area once daily  . Carboxymethylcellul-Glycerin (LUBRICATING EYE DROPS OP) Place 1 drop into both eyes 2 (two) times a day.   . cholecalciferol (VITAMIN D3) 25 MCG (1000 UT) tablet Take 1,000 Units by mouth daily.  . clopidogrel (PLAVIX) 75 MG tablet Take 1 tablet (75 mg total) by mouth daily with breakfast.  . mirabegron ER (MYRBETRIQ) 50 MG TB24 tablet Take 1 tablet (50 mg total) by mouth daily.  . Multiple Vitamin (MULTIVITAMIN WITH MINERALS) TABS tablet Take 1 tablet by mouth daily at 3 pm. Centrum  . OVER THE COUNTER MEDICATION Take 1 tablet by mouth at bedtime as needed (sleep). GNC PREVENTIVE NUTRITION TRI-SLEEP  . polyethylene glycol (MIRALAX / GLYCOLAX) packet Take 17 g by mouth 2 (two) times daily as needed (constipation).   . rosuvastatin (CRESTOR) 10 MG tablet Take 1 tablet (10 mg total)  by mouth daily.  . sildenafil (REVATIO) 20 MG tablet Take up to 5 pills about 30 min prior to sex  . telmisartan (MICARDIS) 80 MG tablet Take 1 tablet (80 mg total) by mouth daily.  Marland Kitchen tretinoin (RETIN-A) 0.1 % cream Apply 1 application topically at bedtime.    No current facility-administered medications on file prior to visit.     Review of Systems  Constitutional: Negative for activity change, appetite change, chills, diaphoresis, fatigue and fever.  HENT: Negative for congestion and hearing loss.   Eyes: Negative for visual disturbance.  Respiratory: Negative for apnea, cough, chest tightness, shortness of breath and wheezing.   Cardiovascular: Negative for chest pain, palpitations and leg swelling.  Gastrointestinal: Negative for abdominal pain, anal bleeding, blood in stool, constipation, diarrhea, nausea and vomiting.  Endocrine: Negative for cold intolerance.  Genitourinary: Negative for difficulty urinating, dysuria, frequency and hematuria.  Musculoskeletal: Negative for arthralgias, back pain and neck pain.  Skin: Negative for rash.  Allergic/Immunologic: Negative for environmental allergies.  Neurological: Positive for numbness. Negative for dizziness, weakness, light-headedness and headaches.  Hematological: Negative for adenopathy.  Psychiatric/Behavioral: Negative for behavioral problems, dysphoric mood and sleep disturbance. The patient is not nervous/anxious.    Per HPI unless specifically indicated above      Objective:    BP 125/67   Pulse 63   Resp 16   Ht 6\' 2"  (1.88 m) Comment: 41 in  Wt 197 lb (89.4 kg)   SpO2 100%   BMI 25.29 kg/m   Wt Readings from Last 3 Encounters:  11/29/18 197 lb (89.4 kg)  11/29/18 197 lb (89.4 kg)  10/20/18 188 lb (85.3 kg)    Physical Exam Vitals signs and nursing note reviewed.  Constitutional:      General: He is not in acute distress.    Appearance: He is well-developed. He is not diaphoretic.     Comments:  Well-appearing, comfortable, cooperative  HENT:     Head: Normocephalic and atraumatic.  Eyes:     General:  Right eye: No discharge.        Left eye: No discharge.     Conjunctiva/sclera: Conjunctivae normal.     Pupils: Pupils are equal, round, and reactive to light.  Neck:     Musculoskeletal: Normal range of motion and neck supple.     Thyroid: No thyromegaly.     Comments: Bilateral Carotid bruit heard radiating from heart Cardiovascular:     Rate and Rhythm: Normal rate and regular rhythm.     Heart sounds: No murmur (2/6 systolic, increased over 2nd ICS space and radiates to carotids).  Pulmonary:     Effort: Pulmonary effort is normal. No respiratory distress.     Breath sounds: Normal breath sounds. No wheezing or rales.  Abdominal:     General: Bowel sounds are normal. There is no distension.     Palpations: Abdomen is soft. There is no mass.     Tenderness: There is no abdominal tenderness.  Musculoskeletal: Normal range of motion.        General: No tenderness.     Comments: Upper / Lower Extremities: - Normal muscle tone, strength bilateral upper extremities 5/5, lower extremities 5/5  Left hand - 4th finger palmar aspect with very tight flexor tendon consistent with dupuytren's flexion contracture, non tender, and also cyst of tendon 5th finger, still has some mobility  Lymphadenopathy:     Cervical: No cervical adenopathy.  Skin:    General: Skin is warm and dry.     Findings: No erythema or rash.  Neurological:     Mental Status: He is alert and oriented to person, place, and time.     Comments: Distal sensation intact to light touch all extremities  Psychiatric:        Behavior: Behavior normal.     Comments: Well groomed, good eye contact, normal speech and thoughts    Results for orders placed or performed in visit on 11/24/18  PSA  Result Value Ref Range   PSA 2.7 < OR = 4.0 ng/mL  Lipid panel  Result Value Ref Range   Cholesterol 146 <200  mg/dL   HDL 71 > OR = 40 mg/dL   Triglycerides 48 <150 mg/dL   LDL Cholesterol (Calc) 61 mg/dL (calc)   Total CHOL/HDL Ratio 2.1 <5.0 (calc)   Non-HDL Cholesterol (Calc) 75 <130 mg/dL (calc)  Hemoglobin A1c  Result Value Ref Range   Hgb A1c MFr Bld 5.6 <5.7 % of total Hgb   Mean Plasma Glucose 114 (calc)   eAG (mmol/L) 6.3 (calc)  VITAMIN D 25 Hydroxy (Vit-D Deficiency, Fractures)  Result Value Ref Range   Vit D, 25-Hydroxy 33 30 - 100 ng/mL      Assessment & Plan:   Problem List Items Addressed This Visit    BPH without urinary obstruction    Stable, without complication Followed by Urology ([facility]) - Last DRE mild +2 (2017), PSA normal Off Flomax now, without problems Seems LUTS mostly due to OAB - on myrbetric      Elevated hemoglobin A1c    Mild elevated A1c to 5.6 Encourage lifestyle and exercise       Essential hypertension - Primary    Normal BP On Telmisartan 80mg  daily, ARB - due to losartan back order, he will notify when ready to swap back to losartan, seemed to work better at 100mg  daily      MGUS (monoclonal gammopathy of unknown significance)    Followed by [facility] Heme/Onc Dr Tasia Catchings With q  6 month lab surveillance      Peripheral neuropathy    Changed in neuropathy symptoms, without worsening Suspected idiopathic vs hereditary etiology still, has ruled out several other possibilities Followed by [facility] Has had extensive work-up EMG, heavy metal, A1c, B12, TSH, other labs, SPEP/UPEP, vascular studies - Failed SNRI Duloxetine (side effects), Lexapro, supplements  Plan: 1. Continue current plan per Neurology with Alpha Lipoic Acid and Vitamin D - follow as scheduled      Pulmonary hypertension ([place])    Secondary complication from aortic stenosis, now improved s/ p TAVR      S/P TAVR (transcatheter aortic valve replacement)    S/p TAVR with good results On cardiac rehab now      Situational anxiety    Stable without worsening Chronic  situational anxiety with some panic symptoms rarely  Controlled on PRN Alprazolam (Xanax) only No co-morbid depression / mood disorder. - Failed: Prozac 20, Duloxetine 30, Lexapro 10 (side effects), Buspar 5 1-2 times daily (side effect/intolerance) - No prior Psych / counseling  Last controlled non opiate contract 10/2017. Reviewed PMP AWARE for past 2 years, appropriate  Plan:  1. Discussion on chronic use BDZ Alprazolam (Xanax) again and discussed potential risk for withdrawal and side effects or other complications - Agree to continue current Alprazolam 0.5mg  BID #60 +2 refills - in future when needed - no new rx at this time, if needed he may notify our office, current rx will expire 10/2018 Follow-up 6 months          Updated Health Maintenance information Reviewed recent lab results with patient Encouraged improvement to lifestyle with diet and exercise Maintain healthy wt  No orders of the defined types were placed in this encounter.    Follow up plan: Return in about 6 months (around 06/01/2019) for A1c.   [name], [facility] 11/29/2018, 9:20 AM

## 2018-11-29 NOTE — Patient Instructions (Addendum)
Mr. James Cardenas ,  Thank you for taking time to come for your Medicare Wellness Visit. I appreciate your ongoing commitment to your health goals. Please review the following plan we discussed and let me know if I can assist you in the future.   Screening recommendations/referrals: Colonoscopy: no longer required Recommended yearly ophthalmology/optometry visit for glaucoma screening and checkup Recommended yearly dental visit for hygiene and checkup  Vaccinations: Influenza vaccine: due 12/2018 Pneumococcal vaccine: up to date Tdap vaccine: up to date Shingles vaccine: shingrix eligible, check with your insurance for coverage    Advanced directives: Please bring a copy of your health care power of attorney and living will to the office at your convenience.  Conditions/risks identified: none   Next appointment: Follow up in one year for your annual wellness visit   Preventive Care 65 Years and Older, Male Preventive care refers to lifestyle choices and visits with your health care provider that can promote health and wellness. What does preventive care include?  A yearly physical exam. This is also called an annual well check.  Dental exams once or twice a year.  Routine eye exams. Ask your health care provider how often you should have your eyes checked.  Personal lifestyle choices, including:  Daily care of your teeth and gums.  Regular physical activity.  Eating a healthy diet.  Avoiding tobacco and drug use.  Limiting alcohol use.  Practicing safe sex.  Taking low doses of aspirin every day.  Taking vitamin and mineral supplements as recommended by your health care provider. What happens during an annual well check? The services and screenings done by your health care provider during your annual well check will depend on your age, overall health, lifestyle risk factors, and family history of disease. Counseling  Your health care provider may ask you questions about  your:  Alcohol use.  Tobacco use.  Drug use.  Emotional well-being.  Home and relationship well-being.  Sexual activity.  Eating habits.  History of falls.  Memory and ability to understand (cognition).  Work and work Statistician. Screening  You may have the following tests or measurements:  Height, weight, and BMI.  Blood pressure.  Lipid and cholesterol levels. These may be checked every 5 years, or more frequently if you are over 51 years old.  Skin check.  Lung cancer screening. You may have this screening every year starting at age 24 if you have a 30-pack-year history of smoking and currently smoke or have quit within the past 15 years.  Fecal occult blood test (FOBT) of the stool. You may have this test every year starting at age 5.  Flexible sigmoidoscopy or colonoscopy. You may have a sigmoidoscopy every 5 years or a colonoscopy every 10 years starting at age 1.  Prostate cancer screening. Recommendations will vary depending on your family history and other risks.  Hepatitis C blood test.  Hepatitis B blood test.  Sexually transmitted disease (STD) testing.  Diabetes screening. This is done by checking your blood sugar (glucose) after you have not eaten for a while (fasting). You may have this done every 1-3 years.  Abdominal aortic aneurysm (AAA) screening. You may need this if you are a current or former smoker.  Osteoporosis. You may be screened starting at age 2 if you are at high risk. Talk with your health care provider about your test results, treatment options, and if necessary, the need for more tests. Vaccines  Your health care provider may recommend certain vaccines, such  as:  Influenza vaccine. This is recommended every year.  Tetanus, diphtheria, and acellular pertussis (Tdap, Td) vaccine. You may need a Td booster every 10 years.  Zoster vaccine. You may need this after age 58.  Pneumococcal 13-valent conjugate (PCV13) vaccine.  One dose is recommended after age 48.  Pneumococcal polysaccharide (PPSV23) vaccine. One dose is recommended after age 52. Talk to your health care provider about which screenings and vaccines you need and how often you need them. This information is not intended to replace advice given to you by your health care provider. Make sure you discuss any questions you have with your health care provider. Document Released: 04/26/2015 Document Revised: 12/18/2015 Document Reviewed: 01/29/2015 Elsevier Interactive Patient Education  2017 [place] Prevention in the Home Falls can cause injuries. They can happen to people of all ages. There are many things you can do to make your home safe and to help prevent falls. What can I do on the outside of my home?  Regularly fix the edges of walkways and driveways and fix any cracks.  Remove anything that might make you trip as you walk through a door, such as a raised step or threshold.  Trim any bushes or trees on the path to your home.  Use bright outdoor lighting.  Clear any walking paths of anything that might make someone trip, such as rocks or tools.  Regularly check to see if handrails are loose or broken. Make sure that both sides of any steps have handrails.  Any raised decks and porches should have guardrails on the edges.  Have any leaves, snow, or ice cleared regularly.  Use sand or salt on walking paths during winter.  Clean up any spills in your garage right away. This includes oil or grease spills. What can I do in the bathroom?  Use night lights.  Install grab bars by the toilet and in the tub and shower. Do not use towel bars as grab bars.  Use non-skid mats or decals in the tub or shower.  If you need to sit down in the shower, use a plastic, non-slip stool.  Keep the floor dry. Clean up any water that spills on the floor as soon as it happens.  Remove soap buildup in the tub or shower regularly.  Attach bath  mats securely with double-sided non-slip rug tape.  Do not have throw rugs and other things on the floor that can make you trip. What can I do in the bedroom?  Use night lights.  Make sure that you have a light by your bed that is easy to reach.  Do not use any sheets or blankets that are too big for your bed. They should not hang down onto the floor.  Have a firm chair that has side arms. You can use this for support while you get dressed.  Do not have throw rugs and other things on the floor that can make you trip. What can I do in the kitchen?  Clean up any spills right away.  Avoid walking on wet floors.  Keep items that you use a lot in easy-to-reach places.  If you need to reach something above you, use a strong step stool that has a grab bar.  Keep electrical cords out of the way.  Do not use floor polish or wax that makes floors slippery. If you must use wax, use non-skid floor wax.  Do not have throw rugs and other things on the  floor that can make you trip. What can I do with my stairs?  Do not leave any items on the stairs.  Make sure that there are handrails on both sides of the stairs and use them. Fix handrails that are broken or loose. Make sure that handrails are as long as the stairways.  Check any carpeting to make sure that it is firmly attached to the stairs. Fix any carpet that is loose or worn.  Avoid having throw rugs at the top or bottom of the stairs. If you do have throw rugs, attach them to the floor with carpet tape.  Make sure that you have a light switch at the top of the stairs and the bottom of the stairs. If you do not have them, ask someone to add them for you. What else can I do to help prevent falls?  Wear shoes that:  Do not have high heels.  Have rubber bottoms.  Are comfortable and fit you well.  Are closed at the toe. Do not wear sandals.  If you use a stepladder:  Make sure that it is fully opened. Do not climb a closed  stepladder.  Make sure that both sides of the stepladder are locked into place.  Ask someone to hold it for you, if possible.  Clearly mark and make sure that you can see:  Any grab bars or handrails.  First and last steps.  Where the edge of each step is.  Use tools that help you move around (mobility aids) if they are needed. These include:  Canes.  Walkers.  Scooters.  Crutches.  Turn on the lights when you go into a dark area. Replace any light bulbs as soon as they burn out.  Set up your furniture so you have a clear path. Avoid moving your furniture around.  If any of your floors are uneven, fix them.  If there are any pets around you, be aware of where they are.  Review your medicines with your doctor. Some medicines can make you feel dizzy. This can increase your chance of falling. Ask your doctor what other things that you can do to help prevent falls. This information is not intended to replace advice given to you by your health care provider. Make sure you discuss any questions you have with your health care provider. Document Released: 01/24/2009 Document Revised: 09/05/2015 Document Reviewed: 05/04/2014 Elsevier Interactive Patient Education  2017 [employer].

## 2018-11-29 NOTE — Patient Instructions (Addendum)
Thank you for coming to the office today.  Keep up the great work with lifestyle, diet and exercise. Adjusting sugar as discussed  Fingerstick A1c in 6 months during the visit.  Check with pharmacy when they have Losartan 100 back in stock, let me know.  Stop taking Fish Oil, keep on Rosuvastatin, working great so far!  Last rx Xanax 08/2018 - has 2 refills, let me know if you need new order  Please schedule a Follow-up Appointment to: Return in about 6 months (around 06/01/2019) for A1c.  If you have any other questions or concerns, please feel free to call the office or send a message through [place]. You may also schedule an earlier appointment if necessary.  Additionally, you may be receiving a survey about your experience at our office within a few days to 1 week by e-mail or mail. We value your feedback.  [name], DO [place]

## 2018-11-29 NOTE — Progress Notes (Signed)
Called and informed patient he does not need any futher pneumococcal vaccines at this point, patient verbalized understanding,g

## 2018-11-30 ENCOUNTER — Other Ambulatory Visit: Payer: Self-pay

## 2018-11-30 ENCOUNTER — Encounter: Payer: Medicare Other | Admitting: *Deleted

## 2018-11-30 DIAGNOSIS — Z952 Presence of prosthetic heart valve: Secondary | ICD-10-CM | POA: Diagnosis not present

## 2018-11-30 NOTE — Assessment & Plan Note (Signed)
Stable, without complication Followed by Urology ([facility]) - Last DRE mild +2 (2017), PSA normal Off Flomax now, without problems Seems LUTS mostly due to OAB - on myrbetric

## 2018-11-30 NOTE — Assessment & Plan Note (Signed)
Mild elevated A1c to 5.6 Encourage lifestyle and exercise

## 2018-11-30 NOTE — Progress Notes (Signed)
Daily Session Note  Patient Details  Name: FINLAY GODBEE MRN: [id number] Date of Birth: Aug 22, 1940 Referring Provider:     Cardiac Rehab from 10/18/2018 in [facility] Cardiac and Pulmonary Rehab  Referring Provider  Sherren Mocha MD      Encounter Date: 11/30/2018  Check In: Session Check In - 11/30/18 1443      Check-In   Supervising physician immediately available to respond to emergencies  See telemetry face sheet for immediately available ER MD    Location  [facility]-Cardiac & Pulmonary Rehab    Staff Present  Heath Lark, RN, BSN, CCRP;Amanda Sommer, BA, ACSM CEP, Exercise Physiologist;Jeanna Durrell BS, Exercise Physiologist    Virtual Visit  No    Medication changes reported      No    Fall or balance concerns reported     No    Warm-up and Cool-down  Performed on first and last piece of equipment    Resistance Training Performed  Yes    VAD Patient?  No    PAD/SET Patient?  No      Pain Assessment   Currently in Pain?  No/denies          Social History   Tobacco Use  Smoking Status Never Smoker  Smokeless Tobacco Never Used    Goals Met:  Independence with exercise equipment Exercise tolerated well No report of cardiac concerns or symptoms Strength training completed today  Goals Unmet:  Not Applicable  Comments: Doing well with exercise Progression   Dr. Emily Filbert is Medical Director for [place] and [facility].

## 2018-11-30 NOTE — Assessment & Plan Note (Signed)
Secondary complication from aortic stenosis, now improved s/ p TAVR 

## 2018-11-30 NOTE — Assessment & Plan Note (Signed)
Stable without worsening Chronic situational anxiety with some panic symptoms rarely  Controlled on PRN Alprazolam (Xanax) only No co-morbid depression / mood disorder. - Failed: Prozac 20, Duloxetine 30, Lexapro 10 (side effects), Buspar 5 1-2 times daily (side effect/intolerance) - No prior Psych / counseling  Last controlled non opiate contract 10/2017. Reviewed PMP AWARE for past 2 years, appropriate  Plan:  1. Discussion on chronic use BDZ Alprazolam (Xanax) again and discussed potential risk for withdrawal and side effects or other complications - Agree to continue current Alprazolam 0.5mg  BID #60 +2 refills - in future when needed - no new rx at this time, if needed he may notify our office, current rx will expire 10/2018 Follow-up 6 months

## 2018-11-30 NOTE — Assessment & Plan Note (Signed)
Followed by [facility] Heme/Onc Dr Tasia Catchings With q 6 month lab surveillance

## 2018-11-30 NOTE — Assessment & Plan Note (Signed)
Normal BP On Telmisartan 80mg  daily, ARB - due to losartan back order, he will notify when ready to swap back to losartan, seemed to work better at 100mg  daily

## 2018-11-30 NOTE — Assessment & Plan Note (Signed)
Changed in neuropathy symptoms, without worsening Suspected idiopathic vs hereditary etiology still, has ruled out several other possibilities Followed by [facility] Has had extensive work-up EMG, heavy metal, A1c, B12, TSH, other labs, SPEP/UPEP, vascular studies - Failed SNRI Duloxetine (side effects), Lexapro, supplements  Plan: 1. Continue current plan per Neurology with Alpha Lipoic Acid and Vitamin D - follow as scheduled

## 2018-11-30 NOTE — Assessment & Plan Note (Signed)
S/p TAVR with good results On cardiac rehab now

## 2018-12-01 DIAGNOSIS — Z952 Presence of prosthetic heart valve: Secondary | ICD-10-CM | POA: Diagnosis not present

## 2018-12-01 NOTE — Progress Notes (Signed)
Daily Session Note  Patient Details  Name: James Cardenas MRN: [id number] Date of Birth: [birth year] Referring Provider:     Cardiac Rehab from 10/18/2018 in [facility] Cardiac and Pulmonary Rehab  Referring Provider  Sherren Mocha MD      Encounter Date: 12/01/2018  Check In:      Social History   Tobacco Use  Smoking Status Never Smoker  Smokeless Tobacco Never Used    Goals Met:  Independence with exercise equipment Exercise tolerated well No report of cardiac concerns or symptoms Strength training completed today  Goals Unmet:  Not Applicable  Comments: Pt able to follow exercise prescription today without complaint.  Will continue to monitor for progression.    Dr. Emily Filbert is Medical Director for [place] and [facility].

## 2018-12-05 ENCOUNTER — Other Ambulatory Visit: Payer: Self-pay

## 2018-12-05 ENCOUNTER — Encounter: Payer: Medicare Other | Admitting: *Deleted

## 2018-12-05 DIAGNOSIS — Z952 Presence of prosthetic heart valve: Secondary | ICD-10-CM

## 2018-12-05 NOTE — Progress Notes (Signed)
Daily Session Note  Patient Details  Name: James Cardenas MRN: [id number] Date of Birth: [birth year] Referring Provider:     Cardiac Rehab from 10/18/2018 in [facility] Cardiac and Pulmonary Rehab  Referring Provider  Sherren Mocha MD      Encounter Date: 12/05/2018  Check In: Session Check In - 12/05/18 1422      Check-In   Supervising physician immediately available to respond to emergencies  See telemetry face sheet for immediately available ER MD    Location  [facility]-Cardiac & Pulmonary Rehab    Staff Present  Renita Papa, RN Moises Blood, BS, ACSM CEP, Exercise Physiologist;Jeanna Durrell BS, Exercise Physiologist    Virtual Visit  No    Medication changes reported      No    Fall or balance concerns reported     No    Warm-up and Cool-down  Performed on first and last piece of equipment    Resistance Training Performed  Yes    VAD Patient?  No    PAD/SET Patient?  No      Pain Assessment   Currently in Pain?  No/denies          Social History   Tobacco Use  Smoking Status Never Smoker  Smokeless Tobacco Never Used    Goals Met:  Independence with exercise equipment Exercise tolerated well No report of cardiac concerns or symptoms Strength training completed today  Goals Unmet:  Not Applicable  Comments: Pt able to follow exercise prescription today without complaint.  Will continue to monitor for progression.    Dr. Emily Filbert is Medical Director for [place] and [facility].

## 2018-12-06 DIAGNOSIS — M79676 Pain in unspecified toe(s): Secondary | ICD-10-CM

## 2018-12-07 ENCOUNTER — Other Ambulatory Visit: Payer: Self-pay | Admitting: Physician Assistant

## 2018-12-07 ENCOUNTER — Telehealth: Payer: Self-pay | Admitting: Physician Assistant

## 2018-12-07 ENCOUNTER — Encounter: Payer: Medicare Other | Admitting: *Deleted

## 2018-12-07 ENCOUNTER — Other Ambulatory Visit: Payer: Self-pay

## 2018-12-07 DIAGNOSIS — Z952 Presence of prosthetic heart valve: Secondary | ICD-10-CM | POA: Diagnosis not present

## 2018-12-07 NOTE — Progress Notes (Signed)
Daily Session Note  Patient Details  Name: James Cardenas MRN: [id number] Date of Birth: 02-Jun-1940 Referring Provider:     Cardiac Rehab from 10/18/2018 in [facility] Cardiac and Pulmonary Rehab  Referring Provider  Sherren Mocha MD      Encounter Date: 12/07/2018  Check In: Session Check In - 12/07/18 1427      Check-In   Supervising physician immediately available to respond to emergencies  See telemetry face sheet for immediately available ER MD    Location  [facility]-Cardiac & Pulmonary Rehab    Staff Present  Renita Papa, RN Vickki Hearing, BA, ACSM CEP, Exercise Physiologist;Jeanna Durrell BS, Exercise Physiologist    Virtual Visit  No    Medication changes reported      No    Fall or balance concerns reported     No    Warm-up and Cool-down  Performed on first and last piece of equipment    Resistance Training Performed  Yes    VAD Patient?  No    PAD/SET Patient?  No      Pain Assessment   Currently in Pain?  No/denies          Social History   Tobacco Use  Smoking Status Never Smoker  Smokeless Tobacco Never Used    Goals Met:  Independence with exercise equipment Exercise tolerated well No report of cardiac concerns or symptoms Strength training completed today  Goals Unmet:  Not Applicable  Comments: Pt able to follow exercise prescription today without complaint.  Will continue to monitor for progression.    Dr. Emily Filbert is Medical Director for [place] and [facility].

## 2018-12-07 NOTE — Telephone Encounter (Signed)
  [place] VALVE TEAM   Patient called in to report excessive bleeding with the plavix. He reports even shaving can cause a significant bleeding. He would like to come off it. He is almost three months out from his TAVR. We will plan to stop plavix at this time. He will continue on aspirin 81 mg daily.    Angelena Form PA-C  [facility]

## 2018-12-08 ENCOUNTER — Encounter: Payer: Medicare Other | Admitting: *Deleted

## 2018-12-08 DIAGNOSIS — Z952 Presence of prosthetic heart valve: Secondary | ICD-10-CM | POA: Diagnosis not present

## 2018-12-08 NOTE — Progress Notes (Signed)
Daily Session Note  Patient Details  Name: James Cardenas MRN: [id number] Date of Birth: Apr 12, 1941 Referring Provider:     Cardiac Rehab from 10/18/2018 in [facility] Cardiac and Pulmonary Rehab  Referring Provider  Sherren Mocha MD      Encounter Date: 12/08/2018  Check In: Session Check In - 12/08/18 1429      Check-In   Supervising physician immediately available to respond to emergencies  See telemetry face sheet for immediately available ER MD    Location  [facility]-Cardiac & Pulmonary Rehab    Staff Present  Renita Papa, RN BSN;Jessica Luan Pulling, MA, RCEP, CCRP, CCET;Jeanna Durrell BS, Exercise Physiologist    Virtual Visit  No    Medication changes reported      No    Fall or balance concerns reported     No    Warm-up and Cool-down  Performed on first and last piece of equipment    Resistance Training Performed  Yes    VAD Patient?  No    PAD/SET Patient?  No      Pain Assessment   Currently in Pain?  No/denies          Social History   Tobacco Use  Smoking Status Never Smoker  Smokeless Tobacco Never Used    Goals Met:  Independence with exercise equipment Exercise tolerated well No report of cardiac concerns or symptoms Strength training completed today  Goals Unmet:  Not Applicable  Comments: Pt able to follow exercise prescription today without complaint.  Will continue to monitor for progression.    Dr. Emily Filbert is Medical Director for [place] and [facility].

## 2018-12-12 ENCOUNTER — Other Ambulatory Visit: Payer: Self-pay

## 2018-12-12 ENCOUNTER — Encounter: Payer: Medicare Other | Admitting: *Deleted

## 2018-12-12 VITALS — Ht 74.2 in | Wt 198.9 lb

## 2018-12-12 DIAGNOSIS — Z79899 Other long term (current) drug therapy: Secondary | ICD-10-CM | POA: Diagnosis not present

## 2018-12-12 DIAGNOSIS — D472 Monoclonal gammopathy: Secondary | ICD-10-CM | POA: Diagnosis not present

## 2018-12-12 DIAGNOSIS — Z974 Presence of external hearing-aid: Secondary | ICD-10-CM | POA: Diagnosis not present

## 2018-12-12 DIAGNOSIS — E785 Hyperlipidemia, unspecified: Secondary | ICD-10-CM | POA: Diagnosis not present

## 2018-12-12 DIAGNOSIS — I272 Pulmonary hypertension, unspecified: Secondary | ICD-10-CM | POA: Diagnosis not present

## 2018-12-12 DIAGNOSIS — Z9842 Cataract extraction status, left eye: Secondary | ICD-10-CM | POA: Diagnosis not present

## 2018-12-12 DIAGNOSIS — I4510 Unspecified right bundle-branch block: Secondary | ICD-10-CM | POA: Diagnosis not present

## 2018-12-12 DIAGNOSIS — Z952 Presence of prosthetic heart valve: Secondary | ICD-10-CM

## 2018-12-12 DIAGNOSIS — M1710 Unilateral primary osteoarthritis, unspecified knee: Secondary | ICD-10-CM | POA: Diagnosis not present

## 2018-12-12 DIAGNOSIS — I2510 Atherosclerotic heart disease of native coronary artery without angina pectoris: Secondary | ICD-10-CM | POA: Diagnosis not present

## 2018-12-12 DIAGNOSIS — I10 Essential (primary) hypertension: Secondary | ICD-10-CM | POA: Diagnosis not present

## 2018-12-12 DIAGNOSIS — Z7982 Long term (current) use of aspirin: Secondary | ICD-10-CM | POA: Diagnosis not present

## 2018-12-12 DIAGNOSIS — Z981 Arthrodesis status: Secondary | ICD-10-CM | POA: Diagnosis not present

## 2018-12-12 DIAGNOSIS — Z800 Family history of malignant neoplasm of digestive organs: Secondary | ICD-10-CM | POA: Diagnosis not present

## 2018-12-12 DIAGNOSIS — Z801 Family history of malignant neoplasm of trachea, bronchus and lung: Secondary | ICD-10-CM | POA: Diagnosis not present

## 2018-12-12 DIAGNOSIS — I4891 Unspecified atrial fibrillation: Secondary | ICD-10-CM | POA: Diagnosis not present

## 2018-12-12 DIAGNOSIS — Z20828 Contact with and (suspected) exposure to other viral communicable diseases: Secondary | ICD-10-CM | POA: Diagnosis not present

## 2018-12-12 DIAGNOSIS — H9190 Unspecified hearing loss, unspecified ear: Secondary | ICD-10-CM | POA: Diagnosis not present

## 2018-12-12 DIAGNOSIS — Z91048 Other nonmedicinal substance allergy status: Secondary | ICD-10-CM | POA: Diagnosis not present

## 2018-12-12 DIAGNOSIS — G629 Polyneuropathy, unspecified: Secondary | ICD-10-CM | POA: Diagnosis not present

## 2018-12-12 DIAGNOSIS — I248 Other forms of acute ischemic heart disease: Secondary | ICD-10-CM | POA: Diagnosis not present

## 2018-12-12 DIAGNOSIS — F419 Anxiety disorder, unspecified: Secondary | ICD-10-CM | POA: Diagnosis not present

## 2018-12-12 DIAGNOSIS — Z9841 Cataract extraction status, right eye: Secondary | ICD-10-CM | POA: Diagnosis not present

## 2018-12-12 DIAGNOSIS — I4892 Unspecified atrial flutter: Secondary | ICD-10-CM | POA: Diagnosis not present

## 2018-12-12 NOTE — Progress Notes (Signed)
Daily Session Note  Patient Details  Name: James Cardenas MRN: [id number] Date of Birth: December 02, 1940 Referring Provider:     Cardiac Rehab from 10/18/2018 in [facility] Cardiac and Pulmonary Rehab  Referring Provider  Sherren Mocha MD      Encounter Date: 12/12/2018  Check In: Session Check In - 12/12/18 1431      Check-In   Supervising physician immediately available to respond to emergencies  See telemetry face sheet for immediately available ER MD    Location  [facility]-Cardiac & Pulmonary Rehab    Staff Present  Renita Papa, RN BSN;Jeanna Durrell BS, Exercise Physiologist;Kelly Amedeo Plenty, BS, ACSM CEP, Exercise Physiologist    Virtual Visit  No    Medication changes reported      No    Fall or balance concerns reported     No    Warm-up and Cool-down  Performed on first and last piece of equipment    Resistance Training Performed  Yes    VAD Patient?  No    PAD/SET Patient?  No      Pain Assessment   Currently in Pain?  No/denies          Social History   Tobacco Use  Smoking Status Never Smoker  Smokeless Tobacco Never Used    Goals Met:  Independence with exercise equipment Exercise tolerated well No report of cardiac concerns or symptoms Strength training completed today  Goals Unmet:  Not Applicable  Comments: Pt able to follow exercise prescription today without complaint.  Will continue to monitor for progression.  [place] Name 10/18/18 1647 12/12/18 1455       6 Minute Walk   Phase  Initial  Discharge    Distance  1402 feet  1405 feet    Distance % Change  -  -0.02 %    Distance Feet Change  -  -3 ft    Walk Time  6 minutes  6 minutes    # of Rest Breaks  0  0    MPH  2.66  2.66    METS  3.29  3.19    RPE  9  12    Perceived Dyspnea   1  -    VO2 Peak  11.53  11.18    Symptoms  No  No    Resting HR  61 bpm  66 bpm    Resting BP  134/72  128/70    Resting Oxygen Saturation   97 %  -    Exercise Oxygen Saturation  during 6 min walk  98  %  -    Max Ex. HR  109 bpm  102 bpm    Max Ex. BP  156/66  160/70    2 Minute Post BP  134/72 Recheck 134/70  -       Dr. Emily Filbert is Medical Director for [place] and [facility].

## 2018-12-14 ENCOUNTER — Encounter: Payer: Medicare Other | Attending: Cardiovascular Disease | Admitting: *Deleted

## 2018-12-14 ENCOUNTER — Other Ambulatory Visit: Payer: Self-pay

## 2018-12-14 DIAGNOSIS — Z952 Presence of prosthetic heart valve: Secondary | ICD-10-CM | POA: Diagnosis not present

## 2018-12-14 NOTE — Progress Notes (Signed)
Daily Session Note  Patient Details  Name: James Cardenas MRN: [id number] Date of Birth: 03/20/1941 Referring Provider:     Cardiac Rehab from 10/18/2018 in [facility] Cardiac and Pulmonary Rehab  Referring Provider  Cooper, Michael MD      Encounter Date: 12/14/2018  Check In: Session Check In - 12/14/18 1427      Check-In   Supervising physician immediately available to respond to emergencies  See telemetry face sheet for immediately available ER MD    Location  [facility]-Cardiac & Pulmonary Rehab    Staff Present  Meredith Craven, RN BSN;Amanda Sommer, BA, ACSM CEP, Exercise Physiologist;Joseph Hood RCP,RRT,BSRT    Virtual Visit  No    Medication changes reported      No    Fall or balance concerns reported     No    Warm-up and Cool-down  Performed on first and last piece of equipment    Resistance Training Performed  Yes    VAD Patient?  No    PAD/SET Patient?  No      Pain Assessment   Currently in Pain?  No/denies          Social History   Tobacco Use  Smoking Status Never Smoker  Smokeless Tobacco Never Used    Goals Met:  Independence with exercise equipment Exercise tolerated well No report of cardiac concerns or symptoms Strength training completed today  Goals Unmet:  Not Applicable  Comments: Pt able to follow exercise prescription today without complaint.  Will continue to monitor for progression.    Dr. Mark Miller is Medical Director for [facility] and [facility]. 

## 2018-12-15 ENCOUNTER — Inpatient Hospital Stay
Admission: EM | Admit: 2018-12-15 | Discharge: 2018-12-16 | DRG: 309 | Disposition: A | Discharge status: Home / Self Care | Payer: Medicare Other | Attending: Internal Medicine | Admitting: Internal Medicine

## 2018-12-15 ENCOUNTER — Encounter: Payer: Medicare Other | Admitting: *Deleted

## 2018-12-15 ENCOUNTER — Other Ambulatory Visit: Payer: Self-pay

## 2018-12-15 ENCOUNTER — Encounter: Payer: Self-pay | Admitting: Emergency Medicine

## 2018-12-15 ENCOUNTER — Emergency Department: Payer: Medicare Other

## 2018-12-15 ENCOUNTER — Telehealth: Payer: Self-pay | Admitting: Cardiovascular Disease

## 2018-12-15 DIAGNOSIS — I2510 Atherosclerotic heart disease of native coronary artery without angina pectoris: Secondary | ICD-10-CM | POA: Diagnosis present

## 2018-12-15 DIAGNOSIS — I4892 Unspecified atrial flutter: Secondary | ICD-10-CM | POA: Diagnosis present

## 2018-12-15 DIAGNOSIS — Z20828 Contact with and (suspected) exposure to other viral communicable diseases: Secondary | ICD-10-CM | POA: Diagnosis present

## 2018-12-15 DIAGNOSIS — Z974 Presence of external hearing-aid: Secondary | ICD-10-CM

## 2018-12-15 DIAGNOSIS — Z9841 Cataract extraction status, right eye: Secondary | ICD-10-CM

## 2018-12-15 DIAGNOSIS — Z7982 Long term (current) use of aspirin: Secondary | ICD-10-CM

## 2018-12-15 DIAGNOSIS — Z91048 Other nonmedicinal substance allergy status: Secondary | ICD-10-CM | POA: Diagnosis not present

## 2018-12-15 DIAGNOSIS — H9190 Unspecified hearing loss, unspecified ear: Secondary | ICD-10-CM | POA: Diagnosis present

## 2018-12-15 DIAGNOSIS — I272 Pulmonary hypertension, unspecified: Secondary | ICD-10-CM | POA: Diagnosis present

## 2018-12-15 DIAGNOSIS — I4510 Unspecified right bundle-branch block: Secondary | ICD-10-CM | POA: Diagnosis present

## 2018-12-15 DIAGNOSIS — D472 Monoclonal gammopathy: Secondary | ICD-10-CM | POA: Diagnosis present

## 2018-12-15 DIAGNOSIS — E785 Hyperlipidemia, unspecified: Secondary | ICD-10-CM | POA: Diagnosis present

## 2018-12-15 DIAGNOSIS — Z9842 Cataract extraction status, left eye: Secondary | ICD-10-CM | POA: Diagnosis not present

## 2018-12-15 DIAGNOSIS — Z800 Family history of malignant neoplasm of digestive organs: Secondary | ICD-10-CM

## 2018-12-15 DIAGNOSIS — Z981 Arthrodesis status: Secondary | ICD-10-CM

## 2018-12-15 DIAGNOSIS — G629 Polyneuropathy, unspecified: Secondary | ICD-10-CM | POA: Diagnosis present

## 2018-12-15 DIAGNOSIS — Z79899 Other long term (current) drug therapy: Secondary | ICD-10-CM | POA: Diagnosis not present

## 2018-12-15 DIAGNOSIS — Z801 Family history of malignant neoplasm of trachea, bronchus and lung: Secondary | ICD-10-CM

## 2018-12-15 DIAGNOSIS — I483 Typical atrial flutter: Secondary | ICD-10-CM | POA: Diagnosis not present

## 2018-12-15 DIAGNOSIS — F419 Anxiety disorder, unspecified: Secondary | ICD-10-CM | POA: Diagnosis present

## 2018-12-15 DIAGNOSIS — Z952 Presence of prosthetic heart valve: Secondary | ICD-10-CM

## 2018-12-15 DIAGNOSIS — I4891 Unspecified atrial fibrillation: Principal | ICD-10-CM | POA: Diagnosis present

## 2018-12-15 DIAGNOSIS — M1710 Unilateral primary osteoarthritis, unspecified knee: Secondary | ICD-10-CM | POA: Diagnosis present

## 2018-12-15 DIAGNOSIS — R000 Tachycardia, unspecified: Secondary | ICD-10-CM | POA: Diagnosis not present

## 2018-12-15 DIAGNOSIS — I10 Essential (primary) hypertension: Secondary | ICD-10-CM | POA: Diagnosis present

## 2018-12-15 DIAGNOSIS — I248 Other forms of acute ischemic heart disease: Secondary | ICD-10-CM | POA: Diagnosis present

## 2018-12-15 DIAGNOSIS — Z03818 Encounter for observation for suspected exposure to other biological agents ruled out: Secondary | ICD-10-CM | POA: Diagnosis not present

## 2018-12-15 LAB — CBC
HCT: 49.1 % (ref 39.0–52.0)
Hemoglobin: 16.4 g/dL (ref 13.0–17.0)
MCH: 31.6 pg (ref 26.0–34.0)
MCHC: 33.4 g/dL (ref 30.0–36.0)
MCV: 94.6 fL (ref 80.0–100.0)
Platelets: 178 10*3/uL (ref 150–400)
RBC: 5.19 MIL/uL (ref 4.22–5.81)
RDW: 13.1 % (ref 11.5–15.5)
WBC: 5.6 10*3/uL (ref 4.0–10.5)
nRBC: 0 % (ref 0.0–0.2)

## 2018-12-15 LAB — BASIC METABOLIC PANEL
Anion gap: 11 (ref 5–15)
BUN: 29 mg/dL — ABNORMAL HIGH (ref 8–23)
CO2: 26 mmol/L (ref 22–32)
Calcium: 9.6 mg/dL (ref 8.9–10.3)
Chloride: 102 mmol/L (ref 98–111)
Creatinine, Ser: 0.97 mg/dL (ref 0.61–1.24)
GFR calc Af Amer: >60 mL/min (ref >60)
GFR calc non Af Amer: >60 mL/min (ref >60)
Glucose, Bld: 108 mg/dL — ABNORMAL HIGH (ref 70–99)
Potassium: 4.3 mmol/L (ref 3.5–5.1)
Sodium: 139 mmol/L (ref 135–145)

## 2018-12-15 LAB — TROPONIN I (HIGH SENSITIVITY)
Troponin I (High Sensitivity): 35 ng/L — ABNORMAL HIGH (ref <18)
Troponin I (High Sensitivity): 35 ng/L — ABNORMAL HIGH (ref <18)
Troponin I (High Sensitivity): 38 ng/L — ABNORMAL HIGH (ref <18)

## 2018-12-15 LAB — MAGNESIUM: Magnesium: 2.2 mg/dL (ref 1.7–2.4)

## 2018-12-15 LAB — SARS CORONAVIRUS 2 BY RT PCR (HOSPITAL ORDER, PERFORMED IN ~~LOC~~ HOSPITAL LAB): SARS Coronavirus 2: NEGATIVE

## 2018-12-15 MED ORDER — DILTIAZEM HCL 25 MG/5ML IV SOLN
10.0000 mg | Freq: Once | INTRAVENOUS | Status: DC
  Filled 2018-12-15: qty 5

## 2018-12-15 MED ORDER — ASPIRIN EC 81 MG PO TBEC: 81.0000 mg | DELAYED_RELEASE_TABLET | Freq: Every evening | ORAL | Status: DC

## 2018-12-15 MED ORDER — SODIUM CHLORIDE 0.9 % IV BOLUS
1000.0000 mL | Freq: Once | INTRAVENOUS | Status: AC
  Administered 2018-12-15: 1000 mL via INTRAVENOUS

## 2018-12-15 MED ORDER — METOPROLOL TARTRATE 5 MG/5ML IV SOLN
5.0000 mg | Freq: Once | INTRAVENOUS | Status: AC
  Administered 2018-12-15: 5 mg via INTRAVENOUS
  Filled 2018-12-15: qty 5

## 2018-12-15 MED ORDER — ALPRAZOLAM 0.5 MG PO TABS
0.5000 mg | ORAL_TABLET | Freq: Two times a day (BID) | ORAL | Status: DC | PRN
  Administered 2018-12-15 – 2018-12-16 (×2): 0.5 mg via ORAL
  Filled 2018-12-15 (×2): qty 1

## 2018-12-15 MED ORDER — MIRABEGRON ER 50 MG PO TB24
50.0000 mg | ORAL_TABLET | Freq: Every day | ORAL | Status: DC
  Administered 2018-12-16: 09:00:00 50 mg via ORAL
  Filled 2018-12-15: qty 1

## 2018-12-15 MED ORDER — ROSUVASTATIN CALCIUM 10 MG PO TABS
10.0000 mg | ORAL_TABLET | Freq: Every day | ORAL | Status: DC
  Administered 2018-12-15 – 2018-12-16 (×2): 10 mg via ORAL
  Filled 2018-12-15 (×2): qty 1

## 2018-12-15 MED ORDER — DILTIAZEM HCL 30 MG PO TABS
30.0000 mg | ORAL_TABLET | Freq: Four times a day (QID) | ORAL | Status: DC
  Administered 2018-12-15 – 2018-12-16 (×3): 30 mg via ORAL
  Filled 2018-12-15 (×3): qty 1

## 2018-12-15 MED ORDER — HYDRALAZINE HCL 20 MG/ML IJ SOLN
10.0000 mg | Freq: Four times a day (QID) | INTRAMUSCULAR | Status: DC | PRN
  Administered 2018-12-15: 10 mg via INTRAVENOUS
  Filled 2018-12-15: qty 1

## 2018-12-15 MED ORDER — IRBESARTAN 75 MG PO TABS
75.0000 mg | ORAL_TABLET | Freq: Every day | ORAL | Status: DC
  Administered 2018-12-16: 75 mg via ORAL
  Filled 2018-12-15: qty 1

## 2018-12-15 MED ORDER — ENOXAPARIN SODIUM 40 MG/0.4ML ~~LOC~~ SOLN
40.0000 mg | SUBCUTANEOUS | Status: DC
  Administered 2018-12-16: 40 mg via SUBCUTANEOUS
  Filled 2018-12-15: qty 0.4

## 2018-12-15 MED ORDER — SODIUM CHLORIDE 0.9% FLUSH: 3.0000 mL | Freq: Once | INTRAVENOUS | Status: DC

## 2018-12-15 MED ORDER — POLYETHYLENE GLYCOL 3350 17 G PO PACK: 17.0000 g | PACK | Freq: Two times a day (BID) | ORAL | Status: DC | PRN

## 2018-12-15 NOTE — Progress Notes (Signed)
Care Alignment Note  Advanced Directives Documents (Living Will, Power of Attorney) currently in the EHR no advanced directives documents available .  Has the patient discussed their wishes with their family/healthcare power of attorney no.  What does the patient/decision maker understand about their medical condition and the natural course of their disease.  New onset atrial fibrillation with rapid ventricular response.  Patient status post recent TAVR.  Hypertension and anxiety  What is the patient/decision maker's biggest fear or concern for the future loss physical function  What is the most important goal for this patient should their health condition worsen maintenance of function.  Current   Code Status: Full Code  Current code status has been reviewed/updated.  Time spent:18 minutes

## 2018-12-15 NOTE — Progress Notes (Signed)
Daily Session Note  Patient Details  Name: James Cardenas MRN: [id number] Date of Birth: May 06, 1940 Referring Provider:     Cardiac Rehab from 10/18/2018 in [facility] Cardiac and Pulmonary Rehab  Referring Provider  Sherren Mocha MD      Encounter Date: 12/15/2018  Check In: Session Check In - 12/15/18 1418      Check-In   Supervising physician immediately available to respond to emergencies  See telemetry face sheet for immediately available ER MD    Location  [facility]-Cardiac & Pulmonary Rehab    Staff Present  Renita Papa, RN BSN;Jessica Luan Pulling, MA, RCEP, CCRP, CCET;Joseph Stover RCP,RRT,BSRT    Virtual Visit  No    Medication changes reported      No    Fall or balance concerns reported     No    Warm-up and Cool-down  Performed on first and last piece of equipment    Resistance Training Performed  Yes    VAD Patient?  No    PAD/SET Patient?  No      Pain Assessment   Currently in Pain?  No/denies          Social History   Tobacco Use  Smoking Status Never Smoker  Smokeless Tobacco Never Used    Goals Met:  Independence with exercise equipment Exercise tolerated well No report of cardiac concerns or symptoms Strength training completed today  Goals Unmet:  Not Applicable  Comments:Ron's Heart rate was consistently at 140 during exercise. He was asymptomatic. During cool down, his heart rate stayed the same. Measures to slow heart rate down were made without success. MD was notified and advised patient to go to ED for assessment. Staff brought him upstairs to ED and passed along the rhythm strips.   [place] graduated today from  rehab with 25 sessions completed.  Details of the patient's exercise prescription and what He needs to do in order to continue the prescription and progress were discussed with patient.  Patient was given a copy of prescription and goals.  Patient verbalized understanding.  K[place] plans to continue to exercise by walking and biking.     Dr.  Emily Filbert is Medical Director for [facility] and [facility].

## 2018-12-15 NOTE — ED Triage Notes (Signed)
PT from Cardiac cath for HR in the 150s and atrial flutter on monitor. PT denies any CP. PT A&OX4

## 2018-12-15 NOTE — ED Notes (Signed)
Blue top with blood draw in triage

## 2018-12-15 NOTE — ED Notes (Signed)
ED TO INPATIENT HANDOFF REPORT  ED Nurse Name and Phone #: Janett Billow [phone]   S Name/Age/Gender James Cardenas 78 y.o. male Room/Bed: ED17A/ED17A  Code Status   Code Status: Full Code  Home/SNF/Other Home Patient oriented to: self, place, time and situation Is this baseline? Yes   Triage Complete: Triage complete  Chief Complaint Atrial flutter  Triage Note PT from Cardiac cath for HR in the 150s and atrial flutter on monitor. PT denies any CP. PT A&OX4   Allergies Allergies  Allergen Reactions  . Nickel Itching    Level of Care/Admitting Diagnosis ED Disposition    ED Disposition Condition [facility]: [place] [[id number]]  Level of Care: Telemetry [5]  Covid Evaluation: Asymptomatic Screening Protocol (No Symptoms)  Diagnosis: Atrial fibrillation with rapid ventricular response [id number]  Admitting Physician: Otila Back [[id number]]  Attending Physician: Otila Back [[id number]]  Estimated length of stay: past midnight tomorrow  Certification:: I certify this patient will need inpatient services for at least 2 midnights  PT Class (Do Not Modify): Inpatient [101]  PT Acc Code (Do Not Modify): Private [1]       B Medical/Surgery History Past Medical History:  Diagnosis Date  . Anxiety   . Arthritis of knee   . Carotid artery disease ([place])   . 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 Coronary artery disease   . Hearing loss    wears hearing aids  . Hypertension   . Neuropathy   . S/P TAVR (transcatheter aortic valve replacement)    26 mm Edwards Sapien THV via the TF approach   . Severe aortic stenosis    Past Surgical History:  Procedure Laterality Date  . COLONOSCOPY    . EYE SURGERY Bilateral    cataract  . HERNIA REPAIR Left    Inguinal  . NECK SURGERY  2011   Cervical Fusion  . RIGHT HEART CATH AND CORONARY ANGIOGRAPHY N/A 07/28/2018   Procedure: RIGHT HEART CATH AND CORONARY ANGIOGRAPHY;  Surgeon: Minna Merritts, MD;  Location: [facility];  Service: Cardiovascular;  Laterality: N/A;  . TEE WITHOUT CARDIOVERSION N/A 09/13/2018   Procedure: TRANSESOPHAGEAL ECHOCARDIOGRAM (TEE);  Surgeon: Sherren Mocha, MD;  Location: [facility];  Service: Open Heart Surgery;  Laterality: N/A;  . TRANSCATHETER AORTIC VALVE REPLACEMENT, TRANSFEMORAL  09/13/2018  . TRANSCATHETER AORTIC VALVE REPLACEMENT, TRANSFEMORAL N/A 09/13/2018   Procedure: TRANSCATHETER AORTIC VALVE REPLACEMENT, TRANSFEMORAL;  Surgeon: Sherren Mocha, MD;  Location: [facility];  Service: Open Heart Surgery;  Laterality: N/A;     A IV Location/Drains/Wounds Patient Lines/Drains/Airways Status   Active Line/Drains/Airways    Name:   Placement date:   Placement time:   Site:   Days:   Peripheral IV 09/13/18 Left Hand   09/13/18    0645    Hand   93   Peripheral IV 09/13/18 Right Wrist   09/13/18    0655    Wrist   93   Peripheral IV 12/15/18 Left Antecubital   12/15/18    1631    Antecubital   less than 1   Incision (Closed) 09/13/18 Groin Left   09/13/18    0854     93   Incision (Closed) 09/13/18 Groin Right   09/13/18    0854     93          Intake/Output Last 24 hours No intake or output data in the  24 hours ending 12/15/18 1949  Labs/Imaging Results for orders placed or performed during the hospital encounter of 12/15/18 (from the past 48 hour(s))  Basic metabolic panel     Status: Abnormal   Collection Time: 12/15/18  4:28 PM  Result Value Ref Range   Sodium 139 135 - 145 mmol/L   Potassium 4.3 3.5 - 5.1 mmol/L   Chloride 102 98 - 111 mmol/L   CO2 26 22 - 32 mmol/L   Glucose, Bld 108 (H) 70 - 99 mg/dL   BUN 29 (H) 8 - 23 mg/dL   Creatinine, Ser 0.97 0.61 - 1.24 mg/dL   Calcium 9.6 8.9 - 10.3 mg/dL   GFR calc non Af Amer >60 >60 mL/min   GFR calc Af Amer >60 >60 mL/min   Anion gap 11 5 - 15    Comment: Performed at [facility], [address] [place], [place] [address]  CBC     Status: None   Collection  Time: 12/15/18  4:28 PM  Result Value Ref Range   WBC 5.6 4.0 - 10.5 K/uL   RBC 5.19 4.22 - 5.81 MIL/uL   Hemoglobin 16.4 13.0 - 17.0 g/dL   HCT 49.1 39.0 - 52.0 %   MCV 94.6 80.0 - 100.0 fL   MCH 31.6 26.0 - 34.0 pg   MCHC 33.4 30.0 - 36.0 g/dL   RDW 13.1 11.5 - 15.5 %   Platelets 178 150 - 400 K/uL   nRBC 0.0 0.0 - 0.2 %    Comment: Performed at [facility], [place], [place] [address]  Troponin I (High Sensitivity)     Status: Abnormal   Collection Time: 12/15/18  4:28 PM  Result Value Ref Range   Troponin I (High Sensitivity) 35 (H) <18 ng/L    Comment: (NOTE) Elevated high sensitivity troponin I (hsTnI) values and significant  changes across serial measurements may suggest ACS but many other  chronic and acute conditions are known to elevate hsTnI results.  Refer to the "Links" section for chest pain algorithms and additional  guidance. Performed at [facility], [address] [place], [place] [address]   Magnesium     Status: None   Collection Time: 12/15/18  4:28 PM  Result Value Ref Range   Magnesium 2.2 1.7 - 2.4 mg/dL    Comment: Performed at [facility], [place], [place] [address]  Troponin I (High Sensitivity)     Status: Abnormal   Collection Time: 12/15/18  6:28 PM  Result Value Ref Range   Troponin I (High Sensitivity) 35 (H) <18 ng/L    Comment: (NOTE) Elevated high sensitivity troponin I (hsTnI) values and significant  changes across serial measurements may suggest ACS but many other  chronic and acute conditions are known to elevate hsTnI results.  Refer to the "Links" section for chest pain algorithms and additional  guidance. Performed at [facility], [address] [place], [place] [address]   SARS Coronavirus 2 [facility] order, Performed in [facility] hospital lab) Nasopharyngeal Nasopharyngeal Swab     Status: None   Collection Time: 12/15/18  6:28 PM   Specimen:  Nasopharyngeal Swab  Result Value Ref Range   SARS Coronavirus 2 NEGATIVE NEGATIVE    Comment: (NOTE) If result is NEGATIVE SARS-CoV-2 target nucleic acids are NOT DETECTED. The SARS-CoV-2 RNA is generally detectable in upper and lower  respiratory specimens during the acute phase of infection. The lowest  concentration of SARS-CoV-2 viral copies this assay can detect is 250  copies / mL. A negative result does not preclude SARS-CoV-2 infection  and should not be used as the sole basis for treatment or other  patient management decisions.  A negative result may occur with  improper specimen collection / handling, submission of specimen other  than nasopharyngeal swab, presence of viral mutation(s) within the  areas targeted by this assay, and inadequate number of viral copies  (<250 copies / mL). A negative result must be combined with clinical  observations, patient history, and epidemiological information. If result is POSITIVE SARS-CoV-2 target nucleic acids are DETECTED. The SARS-CoV-2 RNA is generally detectable in upper and lower  respiratory specimens dur ing the acute phase of infection.  Positive  results are indicative of active infection with SARS-CoV-2.  Clinical  correlation with patient history and other diagnostic information is  necessary to determine patient infection status.  Positive results do  not rule out bacterial infection or co-infection with other viruses. If result is PRESUMPTIVE POSTIVE SARS-CoV-2 nucleic acids MAY BE PRESENT.   A presumptive positive result was obtained on the submitted specimen  and confirmed on repeat testing.  While 2019 novel coronavirus  (SARS-CoV-2) nucleic acids may be present in the submitted sample  additional confirmatory testing may be necessary for epidemiological  and / or clinical management purposes  to differentiate between  SARS-CoV-2 and other Sarbecovirus currently known to infect humans.  If clinically indicated  additional testing with an alternate test  methodology [phone]) is advised. The SARS-CoV-2 RNA is generally  detectable in upper and lower respiratory sp ecimens during the acute  phase of infection. The expected result is Negative. Fact Sheet for Patients:  StrictlyIdeas.no Fact Sheet for Healthcare Providers: BankingDealers.co.za This test is not yet approved or cleared by the [place] FDA and has been authorized for detection and/or diagnosis of SARS-CoV-2 by FDA under an Emergency Use Authorization (EUA).  This EUA will remain in effect (meaning this test can be used) for the duration of the COVID-19 declaration under Section 564(b)(1) of the Act, 21 U.S.C. section 360bbb-3(b)(1), unless the authorization is terminated or revoked sooner. Performed at [facility], [address] [place], [place] [address]    Dg Chest 2 View  Result Date: 12/15/2018 CLINICAL DATA:  Tachycardia atrial flutter EXAM: CHEST - 2 VIEW COMPARISON:  09/13/2018 FINDINGS: Partially visualized hardware in the cervical spine. Hyperinflated lungs. Aortic valve prosthesis. No acute airspace disease or effusion. Stable borderline cardiomegaly. No pneumothorax. IMPRESSION: No active cardiopulmonary disease.  Borderline cardiomegaly. Electronically Signed   By: Donavan Foil M.D.   On: 12/15/2018 17:24    Pending Labs Unresulted Labs (From admission, onward)    Start     Ordered   12/16/18 [id number]  Basic metabolic panel  Tomorrow morning,   STAT     12/15/18 1859   12/16/18 0500  CBC  Tomorrow morning,   STAT     12/15/18 1859   12/16/18 0500  Magnesium  Tomorrow morning,   STAT     12/15/18 1859          Vitals/Pain Today's Vitals   12/15/18 1703 12/15/18 1722 12/15/18 1800 12/15/18 1815  BP: (!) 185/116 (!) 158/120 (!) 170/106 (!) 139/102  Pulse: (!) 135 (!) 113 (!) 46 (!) 105  Resp: 18 15 14 10   Temp:      TempSrc:      SpO2: 99% 97% 98%  99%  PainSc:  0-No pain       Isolation Precautions No active isolations  Medications Medications  sodium chloride flush (NS) 0.9 % injection 3 mL (3 mLs Intravenous Not Given 12/15/18 1708)  aspirin EC tablet 81 mg (has no administration in time range)  rosuvastatin (CRESTOR) tablet 10 mg (has no administration in time range)  irbesartan (AVAPRO) tablet 75 mg (has no administration in time range)  mirabegron ER (MYRBETRIQ) tablet 50 mg (has no administration in time range)  ALPRAZolam (XANAX) tablet 0.5 mg (has no administration in time range)  polyethylene glycol (MIRALAX / GLYCOLAX) packet 17 g (has no administration in time range)  enoxaparin (LOVENOX) injection 40 mg (has no administration in time range)  diltiazem (CARDIZEM) tablet 30 mg (has no administration in time range)  hydrALAZINE (APRESOLINE) injection 10 mg (has no administration in time range)  metoprolol tartrate (LOPRESSOR) injection 5 mg (5 mg Intravenous Given 12/15/18 1708)  sodium chloride 0.9 % bolus 1,000 mL (1,000 mLs Intravenous New Bag/Given 12/15/18 1757)  metoprolol tartrate (LOPRESSOR) injection 5 mg (5 mg Intravenous Given 12/15/18 1803)    Mobility walks Low fall risk   Focused Assessments 1   R Recommendations: See Admitting Provider Note  Report given to:   Additional Notes:

## 2018-12-15 NOTE — H&P (Signed)
[facility] [place] NAME: James Cardenas    MR#:  [id number]  DATE OF BIRTH:  March 18, 1941  DATE OF ADMISSION:  12/15/2018  PRIMARY CARE PHYSICIAN: Olin Hauser, DO   REQUESTING/REFERRING PHYSICIAN: Derrell Lolling  CHIEF COMPLAINT:  No chief complaint on file. Elevated heart rate  HISTORY OF PRESENT ILLNESS:  James Cardenas  is a 78 y.o. male with a known history of recent TAVR done in June 2020 at [facility] and anxiety who was on anticoagulation for 3 months but subsequently discontinued recently due to bleeding very easily who had just completed outpatient physical therapy rehabilitation today.  Went for graduation and was found to have elevated heart rate on arrival today with heart rate in the 140s.  Was transferred to the emergency room and found to be in atrial fibrillation with rapid ventricular response.  Denies any chest pain.  No shortness of breath.  No fevers.  No palpitations.  No prior history of A. fib.  Initial cardiac enzyme with troponin negative.  Patient was given IV metoprolol in the emergency room with improvement in heart rate.  Heart rate currently less than 110.  Patient's primary cardiologist is Dr. Rockey Situ. Medical service called to admit patient for further evaluation and management.  PAST MEDICAL HISTORY:   Past Medical History:  Diagnosis Date  . Anxiety   . Arthritis of knee   . Carotid artery disease ([place])   . 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 Coronary artery disease   . Hearing loss    wears hearing aids  . Hypertension   . Neuropathy   . S/P TAVR (transcatheter aortic valve replacement)    26 mm Edwards Sapien THV via the TF approach   . Severe aortic stenosis     PAST SURGICAL HISTORY:   Past Surgical History:  Procedure Laterality Date  . COLONOSCOPY    . EYE SURGERY Bilateral    cataract  . HERNIA REPAIR Left    Inguinal  . NECK SURGERY  2011   Cervical Fusion  . RIGHT HEART  CATH AND CORONARY ANGIOGRAPHY N/A 07/28/2018   Procedure: RIGHT HEART CATH AND CORONARY ANGIOGRAPHY;  Surgeon: Minna Merritts, MD;  Location: [facility];  Service: Cardiovascular;  Laterality: N/A;  . TEE WITHOUT CARDIOVERSION N/A 09/13/2018   Procedure: TRANSESOPHAGEAL ECHOCARDIOGRAM (TEE);  Surgeon: Sherren Mocha, MD;  Location: [facility];  Service: Open Heart Surgery;  Laterality: N/A;  . TRANSCATHETER AORTIC VALVE REPLACEMENT, TRANSFEMORAL  09/13/2018  . TRANSCATHETER AORTIC VALVE REPLACEMENT, TRANSFEMORAL N/A 09/13/2018   Procedure: TRANSCATHETER AORTIC VALVE REPLACEMENT, TRANSFEMORAL;  Surgeon: Sherren Mocha, MD;  Location: [facility];  Service: Open Heart Surgery;  Laterality: N/A;    SOCIAL HISTORY:   Social History   Tobacco Use  . Smoking status: Never Smoker  . Smokeless tobacco: Never Used  Substance Use Topics  . Alcohol use: Yes    Alcohol/week: 7.0 standard drinks    Types: 1 Cans of beer, 6 Glasses of wine per week   Patient denies history of smoking.  Drinks 1 glass of wine every day.  No history of IV drug use.  FAMILY HISTORY:   Family History  Problem Relation Age of Onset  . Thyroid disease Mother   . Lung cancer Father   . Pancreatic cancer Brother   . Alzheimer's disease Sister   . Prostate cancer Neg Hx   . Bladder Cancer Neg Hx   .  Kidney cancer Neg Hx    No family history of coronary artery disease.  Family history positive for diabetes mellitus   DRUG ALLERGIES:   Allergies  Allergen Reactions  . Nickel Itching    REVIEW OF SYSTEMS:   Review of Systems  Constitutional: Negative for chills and fever.  HENT: Negative for hearing loss and tinnitus.   Eyes: Negative for blurred vision and double vision.  Respiratory: Negative for cough and hemoptysis.   Cardiovascular: Negative for chest pain and palpitations.       Elevated heart rate  Gastrointestinal: Negative for heartburn and nausea.  Genitourinary: Negative for dysuria and  urgency.  Musculoskeletal: Negative for myalgias and neck pain.  Skin: Negative for itching and rash.  Neurological: Negative for dizziness and headaches.  Psychiatric/Behavioral: Negative for depression and hallucinations.    MEDICATIONS AT HOME:   Prior to Admission medications   Medication Sig Start Date End Date Taking? Authorizing Provider  Alpha-Lipoic Acid 600 MG CAPS Take 600 mg by mouth every morning.     [provider]  ALPRAZolam Duanne Moron) 0.5 MG tablet Take 1 tablet (0.5 mg total) by mouth 2 (two) times daily as needed for anxiety. 08/16/18   Karamalegos, Devonne Doughty, DO  amoxicillin (AMOXIL) 500 MG tablet Take 2,000 mg (4 pills) one hour prior to all dental visits. There are enough for two visits in this bottle. 09/21/18   Eileen Stanford, PA-C  aspirin EC 81 MG tablet Take 81 mg by mouth every evening.     [provider]  Azelaic Acid 15 % cream Apply 1 application topically daily. After skin is thoroughly washed and patted dry, gently but thoroughly massage a thin film of azelaic acid cream into the affected area once daily    [provider]  Carboxymethylcellul-Glycerin (LUBRICATING EYE DROPS OP) Place 1 drop into both eyes 2 (two) times a day.     [provider]  cholecalciferol (VITAMIN D3) 25 MCG (1000 UT) tablet Take 1,000 Units by mouth daily.    [provider]  mirabegron ER (MYRBETRIQ) 50 MG TB24 tablet Take 1 tablet (50 mg total) by mouth daily. 11/15/18   Stoioff, Ronda Fairly, MD  Multiple Vitamin (MULTIVITAMIN WITH MINERALS) TABS tablet Take 1 tablet by mouth daily at 3 pm. Centrum    [provider]  OVER THE COUNTER MEDICATION Take 1 tablet by mouth at bedtime as needed (sleep). [place] PREVENTIVE NUTRITION TRI-SLEEP    [provider]  polyethylene glycol (MIRALAX / GLYCOLAX) packet Take 17 g by mouth 2 (two) times daily as needed (constipation).     [provider]  rosuvastatin (CRESTOR) 10 MG  tablet Take 1 tablet (10 mg total) by mouth daily. 10/20/18 10/15/19  Eileen Stanford, PA-C  sildenafil (REVATIO) 20 MG tablet Take up to 5 pills about 30 min prior to sex 10/12/17   Olin Hauser, DO  telmisartan (MICARDIS) 80 MG tablet Take 1 tablet (80 mg total) by mouth daily. 10/13/18   Karamalegos, Devonne Doughty, DO  tretinoin (RETIN-A) 0.1 % cream Apply 1 application topically at bedtime.  07/10/16   [provider]      VITAL SIGNS:  Blood pressure (!) 158/120, pulse (!) 113, temperature 98.1 F (36.7 C), temperature source Oral, resp. rate 15, SpO2 97 %.  PHYSICAL EXAMINATION:  Physical Exam  GENERAL:  78 y.o.-year-old patient lying in the bed with no acute distress.  EYES: Pupils equal, round, reactive to light and accommodation. No  scleral icterus. Extraocular muscles intact.  HEENT: Head atraumatic, normocephalic. Oropharynx and nasopharynx clear.  NECK:  Supple, no jugular venous distention. No thyroid enlargement, no tenderness.  LUNGS: Normal breath sounds bilaterally, no wheezing, rales,rhonchi or crepitation. No use of accessory muscles of respiration.  CARDIOVASCULAR: Irregularly irregular.  No gallops.  ABDOMEN: Soft, nontender, nondistended. Bowel sounds present. No organomegaly or mass.  EXTREMITIES: No pedal edema, cyanosis, or clubbing.  NEUROLOGIC: Cranial nerves II through XII are intact. Muscle strength 5/5 in all extremities. Sensation intact. Gait not checked.  PSYCHIATRIC: The patient is alert and oriented x 3.  SKIN: No obvious rash, lesion, or ulcer.   LABORATORY PANEL:   CBC Recent Labs  Lab 12/15/18 1628  WBC 5.6  HGB 16.4  HCT 49.1  PLT 178   ------------------------------------------------------------------------------------------------------------------  Chemistries  Recent Labs  Lab 12/15/18 1628  NA 139  K 4.3  CL 102  CO2 26  GLUCOSE 108*  BUN 29*  CREATININE 0.97  CALCIUM 9.6  MG 2.2    ------------------------------------------------------------------------------------------------------------------  Cardiac Enzymes No results for input(s): TROPONINI in the last 168 hours. ------------------------------------------------------------------------------------------------------------------  RADIOLOGY:  Dg Chest 2 View  Result Date: 12/15/2018 CLINICAL DATA:  Tachycardia atrial flutter EXAM: CHEST - 2 VIEW COMPARISON:  09/13/2018 FINDINGS: Partially visualized hardware in the cervical spine. Hyperinflated lungs. Aortic valve prosthesis. No acute airspace disease or effusion. Stable borderline cardiomegaly. No pneumothorax. IMPRESSION: No active cardiopulmonary disease.  Borderline cardiomegaly. Electronically Signed   By: Donavan Foil M.D.   On: 12/15/2018 17:24      IMPRESSION AND PLAN:  Patient is a 78 year old male with recent history of TAVR done in June 2020 at [facility] and anxiety disorder been admitted for atrial fibrillation with rapid ventricular response; new onset  1.  New onset atrial fibrillation with rapid ventricular response Heart rate was in the 140s.  Responded with IV metoprolol in the emergency room.  Recent heart rate less than 110 but still fluctuating. Started on p.o. Cardizem 30 mg q. 6 hourly with plans to transition to long-acting Cardizem in a.m. if heart rate controlled. If heart rate remains uncontrolled or gets further elevated may require Cardizem drip.  Admitted to telemetry floor. Patient apparently was on anticoagulation for 3 months following recent TAVR.  Was supposed to be on it for 66-month but had to be discontinued due to bleeding very easily. Cardiologist consulted.  Will defer resumption of anticoagulation to cardiologist. Patient had recent 2D echocardiogram in July 2020 with ejection fraction of 55 to 60% Follow-up cardiac enzymes to rule out acute coronary syndrome.  2.  Hypertension Continue ARB Patient also started on p.o.  Cardizem as mentioned above PRN IV hydralazine with parameters  3.  History of anxiety Resume home regimen of Xanax  DVT prophylaxis; Lovenox   All the records are reviewed and case discussed with ED provider. Management plans discussed with the patient, family and they are in agreement.  CODE STATUS: Full code  TOTAL TIME TAKING CARE OF THIS PATIENT: 53 minutes.    James Cardenas M.D on 12/15/2018 at 6:59 PM  Between 7am to 6pm - Pager - [phone]  After 6pm go to [email or web address] - Proofreader  [facility]  Office  [phone]  CC: Primary care physician; Olin Hauser, DO   Note: This dictation was prepared with Dragon dictation along with smaller phrase technology. Any transcriptional errors that result from this process are unintentional.

## 2018-12-15 NOTE — Telephone Encounter (Signed)
Cardiac rehab states pt came in with HR at 140 an hour later it is the same. Please address if pt should be sent home. Denies any symtpms. States his HR was 62 before he came to rehab

## 2018-12-15 NOTE — ED Provider Notes (Signed)
[facility] Emergency Department Provider Note  ____________________________________________   First MD Initiated Contact with Patient 12/15/18 1633     (approximate)  I have reviewed the triage vital signs and the nursing notes.  History  Chief Complaint No chief complaint on file.    HPI James Cardenas is a 78 y.o. male with history of anxiety, s/p TAVR in June who presents to the emergency department for new onset, asymptomatic tachycardia.  Patient was at his outpatient cardiac rehab, when they noted him to be to be tachycardic into the 140s/150s, concern for atrial flutter versus fibrillation on their monitor.  Patient denies any associated symptoms, he cannot appreciate any heart racing sensation, no palpitations, no chest pain, no shortness of breath.  He denies any history of atrial fibrillation or other arrhythmias.          Past Medical Hx Past Medical History:  Diagnosis Date  . Anxiety   . Arthritis of knee   . Carotid artery disease ([place])   . 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 Coronary artery disease   . Hearing loss    wears hearing aids  . Hypertension   . Neuropathy   . S/P TAVR (transcatheter aortic valve replacement)    26 mm Edwards Sapien THV via the TF approach   . Severe aortic stenosis     Problem List Patient Active Problem List   Diagnosis Date Noted  . Elevated hemoglobin A1c 11/25/2018  . Acute on chronic diastolic (congestive) heart failure ([place]) 09/13/2018  . S/P TAVR (transcatheter aortic valve replacement)   . Carotid artery disease ([place])   . Pulmonary hypertension ([place]) 05/02/2018  . Severe aortic valve stenosis 10/12/2017  . Dupuytren's contracture of left hand 10/12/2017  . MGUS (monoclonal gammopathy of unknown significance) 10/12/2017  . Situational anxiety 07/02/2016  . OAB (overactive bladder) 07/02/2016  . Peripheral neuropathy 07/02/2016  . Constipation 07/02/2016  . Osteoarthritis of  multiple joints 07/02/2016  . ED (erectile dysfunction) 05/28/2016  . BPH without urinary obstruction 03/11/2016  . Essential hypertension 01/24/2014    Past Surgical Hx Past Surgical History:  Procedure Laterality Date  . COLONOSCOPY    . EYE SURGERY Bilateral    cataract  . HERNIA REPAIR Left    Inguinal  . NECK SURGERY  2011   Cervical Fusion  . RIGHT HEART CATH AND CORONARY ANGIOGRAPHY N/A 07/28/2018   Procedure: RIGHT HEART CATH AND CORONARY ANGIOGRAPHY;  Surgeon: Minna Merritts, MD;  Location: [facility];  Service: Cardiovascular;  Laterality: N/A;  . TEE WITHOUT CARDIOVERSION N/A 09/13/2018   Procedure: TRANSESOPHAGEAL ECHOCARDIOGRAM (TEE);  Surgeon: Sherren Mocha, MD;  Location: [facility];  Service: Open Heart Surgery;  Laterality: N/A;  . TRANSCATHETER AORTIC VALVE REPLACEMENT, TRANSFEMORAL  09/13/2018  . TRANSCATHETER AORTIC VALVE REPLACEMENT, TRANSFEMORAL N/A 09/13/2018   Procedure: TRANSCATHETER AORTIC VALVE REPLACEMENT, TRANSFEMORAL;  Surgeon: Sherren Mocha, MD;  Location: [facility];  Service: Open Heart Surgery;  Laterality: N/A;    Medications Prior to Admission medications   Medication Sig Start Date End Date Taking? Authorizing Provider  Alpha-Lipoic Acid 600 MG CAPS Take 600 mg by mouth every morning.     [provider]  ALPRAZolam Duanne Moron) 0.5 MG tablet Take 1 tablet (0.5 mg total) by mouth 2 (two) times daily as needed for anxiety. 08/16/18   Karamalegos, Devonne Doughty, DO  amoxicillin (AMOXIL) 500 MG tablet Take 2,000 mg (4 pills) one hour prior to  all dental visits. There are enough for two visits in this bottle. 09/21/18   Eileen Stanford, PA-C  aspirin EC 81 MG tablet Take 81 mg by mouth every evening.     [provider]  Azelaic Acid 15 % cream Apply 1 application topically daily. After skin is thoroughly washed and patted dry, gently but thoroughly massage a thin film of azelaic acid cream into the affected area once daily    [provider]  Carboxymethylcellul-Glycerin (LUBRICATING EYE DROPS OP) Place 1 drop into both eyes 2 (two) times a day.     [provider]  cholecalciferol (VITAMIN D3) 25 MCG (1000 UT) tablet Take 1,000 Units by mouth daily.    [provider]  mirabegron ER (MYRBETRIQ) 50 MG TB24 tablet Take 1 tablet (50 mg total) by mouth daily. 11/15/18   Stoioff, Ronda Fairly, MD  Multiple Vitamin (MULTIVITAMIN WITH MINERALS) TABS tablet Take 1 tablet by mouth daily at 3 pm. Centrum    [provider]  OVER THE COUNTER MEDICATION Take 1 tablet by mouth at bedtime as needed (sleep). [place] PREVENTIVE NUTRITION TRI-SLEEP    [provider]  polyethylene glycol (MIRALAX / GLYCOLAX) packet Take 17 g by mouth 2 (two) times daily as needed (constipation).     [provider]  rosuvastatin (CRESTOR) 10 MG tablet Take 1 tablet (10 mg total) by mouth daily. 10/20/18 10/15/19  Eileen Stanford, PA-C  sildenafil (REVATIO) 20 MG tablet Take up to 5 pills about 30 min prior to sex 10/12/17   Olin Hauser, DO  telmisartan (MICARDIS) 80 MG tablet Take 1 tablet (80 mg total) by mouth daily. 10/13/18   Karamalegos, Devonne Doughty, DO  tretinoin (RETIN-A) 0.1 % cream Apply 1 application topically at bedtime.  07/10/16   [provider]    Allergies Nickel  Family Hx Family History  Problem Relation Age of Onset  . Thyroid disease Mother   . Lung cancer Father   . Pancreatic cancer Brother   . Alzheimer's disease Sister   . Prostate cancer Neg Hx   . Bladder Cancer Neg Hx   . Kidney cancer Neg Hx     Social Hx Social History   Tobacco Use  . Smoking status: Never Smoker  . Smokeless tobacco: Never Used  Substance Use Topics  . Alcohol use: Yes    Alcohol/week: 7.0 standard drinks    Types: 1 Cans of beer, 6 Glasses of wine per week  . Drug use: No     Review of Systems  Constitutional: Negative for fever. Negative for chills. Eyes: Negative for visual  changes. ENT: Negative for sore throat. Cardiovascular: Negative for chest pain. Respiratory: Negative for shortness of breath. Gastrointestinal: Negative for abdominal pain. Negative for nausea. Negative for vomiting. Genitourinary: Negative for dysuria. Musculoskeletal: Negative for leg swelling. Skin: Negative for rash. Neurological: Negative for for headaches.   Physical Exam  Vital Signs: ED Triage Vitals [12/15/18 1620]  Enc Vitals Group     BP (!) 165/110     Pulse Rate (!) 141     Resp 18     Temp 98.1 F (36.7 C)     Temp Source Oral     SpO2 98 %     Weight      Height      Head Circumference      Peak Flow      Pain Score 0     Pain Loc  Pain Edu?      Excl. in [place]?     Constitutional: Alert and oriented.  Eyes: Conjunctivae clear. Sclera anicteric. Head: Normocephalic. Atraumatic. Nose: No congestion. No rhinorrhea. Mouth/Throat: Mucous membranes are moist.  Neck: No stridor.   Cardiovascular: Tachycardic, irregularly irregular. No murmurs. Extremities well perfused. Respiratory: Normal respiratory effort.  Lungs CTAB. Gastrointestinal: Soft and non-tender. No distention.  Musculoskeletal: No lower extremity edema. Neurologic:  Normal speech and language. No gross focal neurologic deficits are appreciated.  Skin: Skin is warm, dry and intact. No rash noted. Psychiatric: Somewhat anxious.  EKG  Personally reviewed.   Rate: low 100s Rhythm: atrial fibrillation Axis: normal Intervals: QTc 507 ms No acute ischemic changes Atrial fibrillation is new compared to prior No STEMI    Radiology  XR chest: IMPRESSION: No active cardiopulmonary disease.  Borderline cardiomegaly.   Procedures  Procedure(s) performed (including critical care):  .Critical Care Performed by: Lilia Pro., MD Authorized by: Lilia Pro., MD   Critical care provider statement:    Critical care time (minutes):  30   Critical care was necessary to treat or  prevent imminent or life-threatening deterioration of the following conditions:  Cardiac failure   Critical care was time spent personally by me on the following activities:  Evaluation of patient's response to treatment, examination of patient, ordering and performing treatments and interventions, ordering and review of laboratory studies, ordering and review of radiographic studies, pulse oximetry, re-evaluation of patient's condition, obtaining history from patient or surrogate and review of old charts     Initial Impression / Assessment and Plan / ED Course  78 y.o. male who presents to the ED for new onset atrial fibrillation, in RVR. Asymptomatic.  Heart rate on arrival in the 140s, irregular, stable blood pressure (in fact borderline hypertensive).  Heart rate improved to low 100s after IV fluids and 5 mg IV metoprolol x 2. Lab work reveals normal electrolytes, mildly elevated high-sensitivity troponin to 35.  We will plan to admit given his new onset AF, initially in RVR, requiring IV metoprolol and with mildly elevated HS troponin (likely rate related).  Discussed with hospitalist for admission.   Final Clinical Impression(s) / ED Diagnosis  Final diagnoses:  New onset atrial fibrillation ([place])  Atrial fibrillation with RVR ([place])       Note:  This document was prepared using Dragon voice recognition software and may include unintentional dictation errors.   Lilia Pro., MD 12/15/18 [phone]

## 2018-12-15 NOTE — ED Notes (Signed)
Pt in NAD, A&Ox4. HR 135 at this time.

## 2018-12-15 NOTE — Telephone Encounter (Signed)
I spoke with Collie Siad from Cardiac Rehab. She states the patient came in today with a HR of 140 bpm.  He told the Cardiac Rehab staff he was running ~ 62 bpm at home prior to coming to exercise today. Per Collie Siad, he did exercise and his HR stayed 140 bpm.  BP has been 118/58 & 132/82 at Cardiac Rehab.  The patient is asymptomatic at this time. Collie Siad was calling to see what our recommendations were prior to letting the patient go home.  He is s/p TAVR in June and per Collie Siad, he stopped his anticoagulation "sometime this week."   I asked that they fax [place] his telemetry tracings. Staff walked these over here.   Reviewed with Christell Faith, PA. Unclear if the patient is in atrial flutter. Recommendations are to report to the ER for further evaluation and possible TEE/ DCCV.   I have called Collie Siad back at [phone] and made her aware. She states his HR is bouncing down to the 120 range and back up.  She will notify the patient and escort him to the ER.

## 2018-12-15 NOTE — ED Notes (Signed)
Report given to 2A RN

## 2018-12-15 NOTE — Patient Instructions (Signed)
Discharge Patient Instructions  Patient Details  Name: James Cardenas MRN: [id number] Date of Birth: 1940/09/15 Referring Provider:  Sherren Mocha, MD   Number of Visits: 23  Reason for Discharge:  Patient reached a stable level of exercise. Patient independent in their exercise. Patient has met program and personal goals.  Smoking History:  Social History   Tobacco Use  Smoking Status Never Smoker  Smokeless Tobacco Never Used    Diagnosis:  S/P TAVR (transcatheter aortic valve replacement)  Initial Exercise Prescription: Initial Exercise Prescription - 10/18/18 1500      Date of Initial Exercise RX and Referring Provider   Date  10/18/18    Referring Provider  Sherren Mocha MD      Treadmill   MPH  2.5    Grade  1    Minutes  15    METs  3.26      Recumbant Bike   Level  2    RPM  50    Watts  36    Minutes  15    METs  3.2      NuStep   Level  3    SPM  80    Minutes  15    METs  3.2      REL-XR   Level  2    Speed  50    Minutes  15    METs  3.2      T5 Nustep   Level  3    SPM  80    Minutes  15    METs  3.2      Prescription Details   Frequency (times per week)  3    Duration  Progress to 30 minutes of continuous aerobic without signs/symptoms of physical distress      Intensity   THRR 40-80% of Max Heartrate  94-127    Ratings of Perceived Exertion  11-13    Perceived Dyspnea  0-4      Progression   Progression  Continue to progress workloads to maintain intensity without signs/symptoms of physical distress.      Resistance Training   Training Prescription  Yes    Weight  4 lbs    Reps  10-15       Discharge Exercise Prescription (Final Exercise Prescription Changes): Exercise Prescription Changes - 12/01/18 1500      Response to Exercise   Blood Pressure (Admit)  126/62    Blood Pressure (Exercise)  130/56    Blood Pressure (Exit)  104/58    Heart Rate (Admit)  62 bpm    Heart Rate (Exercise)  90 bpm    Heart Rate  (Exit)  78 bpm    Rating of Perceived Exertion (Exercise)  13    Symptoms  none    Duration  Continue with 30 min of aerobic exercise without signs/symptoms of physical distress.    Intensity  THRR unchanged      Progression   Progression  Continue to progress workloads to maintain intensity without signs/symptoms of physical distress.    Average METs  3.1      Resistance Training   Training Prescription  Yes    Weight  6 lbs    Reps  10-15      Interval Training   Interval Training  No      Treadmill   MPH  2.7    Grade  1    Minutes  15    METs  3.44  Recumbant Bike   Level  3    Watts  25    Minutes  15    METs  3.11      T5 Nustep   Level  3    Minutes  15      Biostep-RELP   Level  2    Minutes  15    METs  3      Home Exercise Plan   Plans to continue exercise at  Home (comment)   walking and biking   Frequency  Add 2 additional days to program exercise sessions.    Initial Home Exercises Provided  11/07/18       Functional Capacity: 6 Minute Walk    Row Name 10/18/18 1647 12/12/18 1455       6 Minute Walk   Phase  Initial  Discharge    Distance  1402 feet  1405 feet    Distance % Change  -  -0.02 %    Distance Feet Change  -  -3 ft    Walk Time  6 minutes  6 minutes    # of Rest Breaks  0  0    MPH  2.66  2.66    METS  3.29  3.19    RPE  9  12    Perceived Dyspnea   1  -    VO2 Peak  11.53  11.18    Symptoms  No  No    Resting HR  61 bpm  66 bpm    Resting BP  134/72  128/70    Resting Oxygen Saturation   97 %  -    Exercise Oxygen Saturation  during 6 min walk  98 %  -    Max Ex. HR  109 bpm  102 bpm    Max Ex. BP  156/66  160/70    2 Minute Post BP  134/72 Recheck 134/70  -       Quality of Life: Quality of Life - 12/12/18 1358      Quality of Life Scores   Health/Function Pre  24.03 %    Health/Function Post  22.53 %    Health/Function % Change  -6.24 %    Socioeconomic Pre  23.43 %    Socioeconomic Post  23.43 %     Socioeconomic % Change   0 %    Psych/Spiritual Pre  26.14 %    Psych/Spiritual Post  30 %    Psych/Spiritual % Change  14.77 %    Family Pre  27.6 %    Family Post  28.8 %    Family % Change  4.35 %    GLOBAL Pre  24.82 %    GLOBAL Post  25.12 %    GLOBAL % Change  1.21 %       Personal Goals: Goals established at orientation with interventions provided to work toward goal. Personal Goals and Risk Factors at Admission - 10/18/18 1510      Core Components/Risk Factors/Patient Goals on Admission    Weight Management  Yes;Weight Maintenance    Intervention  Weight Management: Develop a combined nutrition and exercise program designed to reach desired caloric intake, while maintaining appropriate intake of nutrient and fiber, sodium and fats, and appropriate energy expenditure required for the weight goal.;Weight Management: Provide education and appropriate resources to help participant work on and attain dietary goals.    Admit Weight  193 lb 4.8 oz (87.7 kg)  Goal Weight: Short Term  193 lb (87.5 kg)    Goal Weight: Long Term  193 lb (87.5 kg)    Expected Outcomes  Short Term: Continue to assess and modify interventions until short term weight is achieved;Long Term: Adherence to nutrition and physical activity/exercise program aimed toward attainment of established weight goal;Weight Maintenance: Understanding of the daily nutrition guidelines, which includes 25-35% calories from fat, 7% or less cal from saturated fats, less than 238m cholesterol, less than 1.5gm of sodium, & 5 or more servings of fruits and vegetables daily    Heart Failure  Yes    Intervention  Provide a combined exercise and nutrition program that is supplemented with education, support and counseling about heart failure. Directed toward relieving symptoms such as shortness of breath, decreased exercise tolerance, and extremity edema.    Expected Outcomes  Improve functional capacity of life;Short term: Attendance in  program 2-3 days a week with increased exercise capacity. Reported lower sodium intake. Reported increased fruit and vegetable intake. Reports medication compliance.;Short term: Daily weights obtained and reported for increase. Utilizing diuretic protocols set by physician.;Long term: Adoption of self-care skills and reduction of barriers for early signs and symptoms recognition and intervention leading to self-care maintenance.    Hypertension  Yes    Intervention  Provide education on lifestyle modifcations including regular physical activity/exercise, weight management, moderate sodium restriction and increased consumption of fresh fruit, vegetables, and low fat dairy, alcohol moderation, and smoking cessation.;Monitor prescription use compliance.    Expected Outcomes  Short Term: Continued assessment and intervention until BP is < 140/926mHG in hypertensive participants. < 130/8081mG in hypertensive participants with diabetes, heart failure or chronic kidney disease.;Long Term: Maintenance of blood pressure at goal levels.        Personal Goals Discharge: Goals and Risk Factor Review - 11/28/18 1503      Core Components/Risk Factors/Patient Goals Review   Personal Goals Review  Weight Management/Obesity;Hypertension;Heart Failure    Review  Ron is doing well.  His weight continues to stay steady around 180 lbs. He is able to cheat some days, but gets that right back down.  His pressures have been good and can usually range around 110/60s resting.  He denies symptoms of heart failue and has not had issues with it.  His next echo is a year out since he doing so well.    Expected Outcomes  Short: Continue to monitor weight.  Long: Continue manage heart failure.       Exercise Goals and Review: Exercise Goals    Row Name 10/04/18 1132             Exercise Goals   Increase Physical Activity  Yes       Intervention  Provide advice, education, support and counseling about physical  activity/exercise needs.;Develop an individualized exercise prescription for aerobic and resistive training based on initial evaluation findings, risk stratification, comorbidities and participant's personal goals.       Expected Outcomes  Short Term: Attend rehab on a regular basis to increase amount of physical activity.;Long Term: Add in home exercise to make exercise part of routine and to increase amount of physical activity.;Long Term: Exercising regularly at least 3-5 days a week.       Increase Strength and Stamina  Yes       Intervention  Provide advice, education, support and counseling about physical activity/exercise needs.;Develop an individualized exercise prescription for aerobic and resistive training based on initial evaluation findings, risk  stratification, comorbidities and participant's personal goals.       Expected Outcomes  Short Term: Increase workloads from initial exercise prescription for resistance, speed, and METs.;Short Term: Perform resistance training exercises routinely during rehab and add in resistance training at home;Long Term: Improve cardiorespiratory fitness, muscular endurance and strength as measured by increased METs and functional capacity (6MWT)       Able to understand and use rate of perceived exertion (RPE) scale  Yes       Intervention  Provide education and explanation on how to use RPE scale       Expected Outcomes  Short Term: Able to use RPE daily in rehab to express subjective intensity level;Long Term:  Able to use RPE to guide intensity level when exercising independently       Able to understand and use Dyspnea scale  Yes       Intervention  Provide education and explanation on how to use Dyspnea scale       Expected Outcomes  Short Term: Able to use Dyspnea scale daily in rehab to express subjective sense of shortness of breath during exertion;Long Term: Able to use Dyspnea scale to guide intensity level when exercising independently        Knowledge and understanding of Target Heart Rate Range (THRR)  Yes       Intervention  Provide education and explanation of THRR including how the numbers were predicted and where they are located for reference       Expected Outcomes  Short Term: Able to state/look up THRR;Short Term: Able to use daily as guideline for intensity in rehab;Long Term: Able to use THRR to govern intensity when exercising independently       Able to check pulse independently  Yes       Intervention  Provide education and demonstration on how to check pulse in carotid and radial arteries.;Review the importance of being able to check your own pulse for safety during independent exercise       Expected Outcomes  Short Term: Able to explain why pulse checking is important during independent exercise;Long Term: Able to check pulse independently and accurately       Understanding of Exercise Prescription  Yes       Intervention  Provide education, explanation, and written materials on patient's individual exercise prescription       Expected Outcomes  Short Term: Able to explain program exercise prescription;Long Term: Able to explain home exercise prescription to exercise independently          Exercise Goals Re-Evaluation: Exercise Goals Re-Evaluation    Row Name 10/20/18 1132 11/02/18 1521 11/07/18 1425 11/14/18 1225 11/17/18 1531     Exercise Goal Re-Evaluation   Exercise Goals Review  Increase Physical Activity;Increase Strength and Stamina;Able to understand and use rate of perceived exertion (RPE) scale;Able to understand and use Dyspnea scale;Knowledge and understanding of Target Heart Rate Range (THRR);Understanding of Exercise Prescription  Increase Physical Activity;Increase Strength and Stamina;Understanding of Exercise Prescription  Increase Physical Activity;Increase Strength and Stamina;Understanding of Exercise Prescription  Increase Physical Activity;Increase Strength and Stamina;Understanding of Exercise  Prescription  Understanding of Exercise Prescription;Able to understand and use rate of perceived exertion (RPE) scale;Increase Physical Activity   Comments  Reviewed RPE scale, THR and program prescription with pt today.  Pt voiced understanding and was given a copy of goals to take home.  Ron is off to a good start in rehab.  He already up to 2.7 mph on the  treadmill.  We will continue to monitor his progress.  Ron is doing well with exercise. Ron is still walking at home some.  But now that he is in rehab consistently, he is having a harder time getting it in his schedule.  We talked about the importance getting in his cardio 5 days a week. Updated home exercise with pt today.  Pt plans to walk and use bike at home for exercise.  Reviewed THR, pulse, RPE, sign and symptoms, and when to call 911 or MD.  Also discussed weather considerations and indoor options.  Pt voiced understanding.  Ron continues to do well in rehab.  He is up to 29 watts on the bike and 3 METs on the BioStep.  We will continue to monitor his progress.  ROn asked questions about METS. THat was explained to him today and he verbalized understanding.  He also told staff he has been helping his friend with yardwork-increasing physical activity   Expected Outcomes  Short: Use RPE daily to regulate intensity. Long: Follow program prescription in THR.  Short: Continue to increase workloads.  Long: Continue to follow program prescription.  Short: Get back on bike at least two extra days a week.  Long: Continue to increase stamina.  Short: Increase workloads.  Long: Continue to increase strength and stamina.  Short: Increase workloads. Continue to learn about process of exercise prescription  Long: Continue to increase strength and stamina.   [place] Name 11/28/18 1456             Exercise Goal Re-Evaluation   Exercise Goals Review  Increase Physical Activity;Increase Strength and Stamina;Understanding of Exercise Prescription       Comments   Ron is doing well in rehab.  He is doing his best to walk with his dog at home daily. He also stretches regularly and continues to help a friend with yard work.  He is feeling good with his strength and stamina and continues to see improvements.       Expected Outcomes  Short: Make time to get in his walks daily.  Long: Continue to improve stamina.          Nutrition & Weight - Outcomes: Pre Biometrics - 10/18/18 1510      Pre Biometrics   Height  6' 2.2" (1.885 m)    Weight  193 lb 4.8 oz (87.7 kg)    BMI (Calculated)  24.68      Post Biometrics - 12/12/18 1456       Post  Biometrics   Height  6' 2.2" (1.885 m)    Weight  198 lb 14.4 oz (90.2 kg)    BMI (Calculated)  25.39       Nutrition: Nutrition Therapy & Goals - 10/03/18 1200      Nutrition Therapy   Diet  HH, low Na    Protein (specify units)  70g    Fiber  25 grams    Whole Grain Foods  3 servings    Saturated Fats  12 max. grams    Fruits and Vegetables  5 servings/day    Sodium  1.5 grams      Personal Nutrition Goals   Nutrition Goal  ST: continue HH eating, learn more about HH eating LT: maintian weight    Comments  Used to be very disciplined 5 years ago when his medical problems started. Tries to eat reasonably healthy and nowuses butter/olive oil combination, now eats whole wheat and whole grains, will have  eggs with bacon 2x/week, banana with cereal (rice crispy and corn flakes together but sometimes will have raisin bran) and frozen fruit (like berries) x3x/week and will have applesauce on toast instead of jelly. Will normally eat B and D. Has a jar of peanuts for a snack and will have cuties or halos every once in a while. D: will grill steak every once in a while, chicken breast, frozen veggies (steams) broccoli, brussell sprouts, sweet potatoes every once in a while, corn (not much). (meat or poultry with vegetable) once and a while will have fish (bake or broil) gets at [employer] but hasn't been over  there with COVID and  doesn't use mayo uses low calorie miricle whip with tuna and toast. Will have some frozen pizza or fish sticks every once in a while. will have baked beans (don't like other beans). Doesn't fry much, tries to grill it, bake it, or broil it. will have graham crackers for a snack or some angel food cake and frozen yogurt. will has some mild constipation due to bladder pills. weight 185-189lbs, focus on goal to 182-184lbs - weighs regularly. Will use olive oil for most things, will eat greek salads and chef salads (regular dressings) and gets them out. Disscussed MyPlate and HH eating, answered all questions (corn, peas, whole wheat, gluten). Water consumption low, but will mix water with other things.      Intervention Plan   Intervention  Prescribe, educate and counsel regarding individualized specific dietary modifications aiming towards targeted core components such as weight, hypertension, lipid management, diabetes, heart failure and other comorbidities.;Nutrition handout(s) given to patient.    Expected Outcomes  Short Term Goal: A plan has been developed with personal nutrition goals set during dietitian appointment.;Long Term Goal: Adherence to prescribed nutrition plan.;Short Term Goal: Understand basic principles of dietary content, such as calories, fat, sodium, cholesterol and nutrients.       Nutrition Discharge: Nutrition Assessments - 12/12/18 1357      MEDFICTS Scores   Pre Score  21    Post Score  22    Score Difference  1       Education Questionnaire Score: Knowledge Questionnaire Score - 12/12/18 1358      Knowledge Questionnaire Score   Pre Score  24/26    Post Score  25/26       Goals reviewed with patient; copy given to patient.

## 2018-12-15 NOTE — Progress Notes (Signed)
Cardiac Individual Treatment Plan  Patient Details  Name: James Cardenas MRN: [id number] Date of Birth: 1940/05/16 Referring Provider:     Cardiac Rehab from 10/18/2018 in [facility] Cardiac and Pulmonary Rehab  Referring Provider  Sherren Mocha MD      Initial Encounter Date:    Cardiac Rehab from 10/18/2018 in [facility] Cardiac and Pulmonary Rehab  Date  10/18/18      Visit Diagnosis: S/P TAVR (transcatheter aortic valve replacement)  Patient's Home Medications on Admission:  Current Outpatient Medications:  .  Alpha-Lipoic Acid 600 MG CAPS, Take 600 mg by mouth every morning. , Disp: , Rfl:  .  ALPRAZolam (XANAX) 0.5 MG tablet, Take 1 tablet (0.5 mg total) by mouth 2 (two) times daily as needed for anxiety., Disp: 60 tablet, Rfl: 2 .  amoxicillin (AMOXIL) 500 MG tablet, Take 2,000 mg (4 pills) one hour prior to all dental visits. There are enough for two visits in this bottle., Disp: 8 tablet, Rfl: 6 .  aspirin EC 81 MG tablet, Take 81 mg by mouth every evening. , Disp: , Rfl:  .  Azelaic Acid 15 % cream, Apply 1 application topically daily. After skin is thoroughly washed and patted dry, gently but thoroughly massage a thin film of azelaic acid cream into the affected area once daily, Disp: , Rfl:  .  Carboxymethylcellul-Glycerin (LUBRICATING EYE DROPS OP), Place 1 drop into both eyes 2 (two) times a day. , Disp: , Rfl:  .  cholecalciferol (VITAMIN D3) 25 MCG (1000 UT) tablet, Take 1,000 Units by mouth daily., Disp: , Rfl:  .  mirabegron ER (MYRBETRIQ) 50 MG TB24 tablet, Take 1 tablet (50 mg total) by mouth daily., Disp: 90 tablet, Rfl: 3 .  Multiple Vitamin (MULTIVITAMIN WITH MINERALS) TABS tablet, Take 1 tablet by mouth daily at 3 pm. Centrum, Disp: , Rfl:  .  OVER THE COUNTER MEDICATION, Take 1 tablet by mouth at bedtime as needed (sleep). GNC PREVENTIVE NUTRITION TRI-SLEEP, Disp: , Rfl:  .  polyethylene glycol (MIRALAX / GLYCOLAX) packet, Take 17 g by mouth 2 (two) times daily as needed  (constipation). , Disp: , Rfl:  .  rosuvastatin (CRESTOR) 10 MG tablet, Take 1 tablet (10 mg total) by mouth daily., Disp: 90 tablet, Rfl: 3 .  sildenafil (REVATIO) 20 MG tablet, Take up to 5 pills about 30 min prior to sex, Disp: 50 tablet, Rfl: 8 .  telmisartan (MICARDIS) 80 MG tablet, Take 1 tablet (80 mg total) by mouth daily., Disp: 90 tablet, Rfl: 1 .  tretinoin (RETIN-A) 0.1 % cream, Apply 1 application topically at bedtime. , Disp: , Rfl: 0  Past Medical History: Past Medical History:  Diagnosis Date  . Anxiety   . Arthritis of knee   . Carotid artery disease ([place])   . 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 Coronary artery disease   . Hearing loss    wears hearing aids  . Hypertension   . Neuropathy   . S/P TAVR (transcatheter aortic valve replacement)    26 mm Edwards Sapien THV via the TF approach   . Severe aortic stenosis     Tobacco Use: Social History   Tobacco Use  Smoking Status Never Smoker  Smokeless Tobacco Never Used    Labs: Recent Review Flowsheet Data    Labs for ITP Cardiac and Pulmonary Rehab Latest Ref Rng & Units 09/29/2016 10/12/2017 09/09/2018 09/13/2018 11/24/2018   Cholestrol <200 mg/dL 171 - - - 146  LDLCALC mg/dL (calc) 90 - - - 61   HDL > OR = 40 mg/dL 73 - - - 71   Trlycerides <150 mg/dL 40 - - - 48   Hemoglobin A1c <5.7 % of total Hgb 5.4 5.5 5.5 - 5.6   PHART 7.350 - 7.450 - - 7.468(H) 7.434 -   PCO2ART 32.0 - 48.0 mmHg - - 33.8 33.3 -   HCO3 20.0 - 28.0 mmol/L - - 24.2 22.3 -   TCO2 22 - 32 mmol/L - - - 23 -   ACIDBASEDEF 0.0 - 2.0 mmol/L - - - 1.0 -   O2SAT % - - 98.6 99.0 -       Exercise Target Goals: Exercise Program Goal: Individual exercise prescription set using results from initial 6 min walk test and THRR while considering  patient's activity barriers and safety.   Exercise Prescription Goal: Initial exercise prescription builds to 30-45 minutes a day of aerobic activity, 2-3 days per week.  Home exercise guidelines will  be given to patient during program as part of exercise prescription that the participant will acknowledge.  Activity Barriers & Risk Stratification: Activity Barriers & Cardiac Risk Stratification - 10/18/18 1507      Activity Barriers & Cardiac Risk Stratification   Activity Barriers  Deconditioning;Muscular Weakness;Other (comment)    Comments  neuropathy    Cardiac Risk Stratification  Low       6 Minute Walk: 6 Minute Walk    Row Name 10/18/18 1647 12/12/18 1455       6 Minute Walk   Phase  Initial  Discharge    Distance  1402 feet  1405 feet    Distance % Change  -  -0.02 %    Distance Feet Change  -  -3 ft    Walk Time  6 minutes  6 minutes    # of Rest Breaks  0  0    MPH  2.66  2.66    METS  3.29  3.19    RPE  9  12    Perceived Dyspnea   1  -    VO2 Peak  11.53  11.18    Symptoms  No  No    Resting HR  61 bpm  66 bpm    Resting BP  134/72  128/70    Resting Oxygen Saturation   97 %  -    Exercise Oxygen Saturation  during 6 min walk  98 %  -    Max Ex. HR  109 bpm  102 bpm    Max Ex. BP  156/66  160/70    2 Minute Post BP  134/72 Recheck 134/70  -       Oxygen Initial Assessment:   Oxygen Re-Evaluation:   Oxygen Discharge (Final Oxygen Re-Evaluation):   Initial Exercise Prescription: Initial Exercise Prescription - 10/18/18 1500      Date of Initial Exercise RX and Referring Provider   Date  10/18/18    Referring Provider  Sherren Mocha MD      Treadmill   MPH  2.5    Grade  1    Minutes  15    METs  3.26      Recumbant Bike   Level  2    RPM  50    Watts  36    Minutes  15    METs  3.2      NuStep   Level  3  SPM  80    Minutes  15    METs  3.2      REL-XR   Level  2    Speed  50    Minutes  15    METs  3.2      T5 Nustep   Level  3    SPM  80    Minutes  15    METs  3.2      Prescription Details   Frequency (times per week)  3    Duration  Progress to 30 minutes of continuous aerobic without signs/symptoms of  physical distress      Intensity   THRR 40-80% of Max Heartrate  94-127    Ratings of Perceived Exertion  11-13    Perceived Dyspnea  0-4      Progression   Progression  Continue to progress workloads to maintain intensity without signs/symptoms of physical distress.      Resistance Training   Training Prescription  Yes    Weight  4 lbs    Reps  10-15       Perform Capillary Blood Glucose checks as needed.  Exercise Prescription Changes:  Exercise Prescription Changes    Row Name 11/02/18 1500 11/07/18 1400 11/14/18 1200 12/01/18 1500       Response to Exercise   Blood Pressure (Admit)  142/70  -  112/66  126/62    Blood Pressure (Exercise)  164/84  -  134/72  130/56    Blood Pressure (Exit)  146/70  -  122/58  104/58    Heart Rate (Admit)  67 bpm  -  68 bpm  62 bpm    Heart Rate (Exercise)  97 bpm  -  91 bpm  90 bpm    Heart Rate (Exit)  82 bpm  -  72 bpm  78 bpm    Rating of Perceived Exertion (Exercise)  14  -  14  13    Symptoms  none  -  none  none    Duration  Continue with 30 min of aerobic exercise without signs/symptoms of physical distress.  -  Continue with 30 min of aerobic exercise without signs/symptoms of physical distress.  Continue with 30 min of aerobic exercise without signs/symptoms of physical distress.    Intensity  THRR unchanged  -  THRR unchanged  THRR unchanged      Progression   Progression  Continue to progress workloads to maintain intensity without signs/symptoms of physical distress.  -  Continue to progress workloads to maintain intensity without signs/symptoms of physical distress.  Continue to progress workloads to maintain intensity without signs/symptoms of physical distress.    Average METs  3.3  -  2.81  3.1      Resistance Training   Training Prescription  Yes  -  Yes  Yes    Weight  5 lbs  -  6 lbs  6 lbs    Reps  10-15  -  10-15  10-15      Interval Training   Interval Training  No  -  No  No      Treadmill   MPH  2.7  -  2.7   2.7    Grade  1  -  1  1    Minutes  15  -  15  15    METs  3.44  -  3.44  3.44      Recumbant Bike  Level  2  -  3  3    Watts  25  -  29  25    Minutes  15  -  15  15    METs  2.87  -  3.11  3.11      REL-XR   Level  4  -  -  -    Minutes  15  -  -  -    METs  4.3  -  -  -      T5 Nustep   Level  3  -  3  3    Minutes  15  -  15  15    METs  2.6  -  2.7  -      Biostep-RELP   Level  -  -  2  2    Minutes  -  -  15  15    METs  -  -  3  3      Home Exercise Plan   Plans to continue exercise at  -  Home (comment) walking and biking  Home (comment) walking and biking  Home (comment) walking and biking    Frequency  -  Add 2 additional days to program exercise sessions.  Add 2 additional days to program exercise sessions.  Add 2 additional days to program exercise sessions.    Initial Home Exercises Provided  -  11/07/18  11/07/18  11/07/18       Exercise Comments:  Exercise Comments    Row Name 10/20/18 1131 12/15/18 1420         Exercise Comments  .First full day of exercise!  Patient was oriented to gym and equipment including functions, settings, policies, and procedures.  Patient's individual exercise prescription and treatment plan were reviewed.  All starting workloads were established based on the results of the 6 minute walk test done at initial orientation visit.  The plan for exercise progression was also introduced and progression will be customized based on patient's performance and goals.  Lochlin graduated today from  rehab with 25 sessions completed.  Details of the patient's exercise prescription and what He needs to do in order to continue the prescription and progress were discussed with patient.  Patient was given a copy of prescription and goals.  Patient verbalized understanding.  Bannon plans to continue to exercise by walking and biking.         Exercise Goals and Review:  Exercise Goals    Row Name 10/04/18 1132             Exercise Goals    Increase Physical Activity  Yes       Intervention  Provide advice, education, support and counseling about physical activity/exercise needs.;Develop an individualized exercise prescription for aerobic and resistive training based on initial evaluation findings, risk stratification, comorbidities and participant's personal goals.       Expected Outcomes  Short Term: Attend rehab on a regular basis to increase amount of physical activity.;Long Term: Add in home exercise to make exercise part of routine and to increase amount of physical activity.;Long Term: Exercising regularly at least 3-5 days a week.       Increase Strength and Stamina  Yes       Intervention  Provide advice, education, support and counseling about physical activity/exercise needs.;Develop an individualized exercise prescription for aerobic and resistive training based on initial evaluation findings, risk stratification, comorbidities and participant's personal goals.  Expected Outcomes  Short Term: Increase workloads from initial exercise prescription for resistance, speed, and METs.;Short Term: Perform resistance training exercises routinely during rehab and add in resistance training at home;Long Term: Improve cardiorespiratory fitness, muscular endurance and strength as measured by increased METs and functional capacity (6MWT)       Able to understand and use rate of perceived exertion (RPE) scale  Yes       Intervention  Provide education and explanation on how to use RPE scale       Expected Outcomes  Short Term: Able to use RPE daily in rehab to express subjective intensity level;Long Term:  Able to use RPE to guide intensity level when exercising independently       Able to understand and use Dyspnea scale  Yes       Intervention  Provide education and explanation on how to use Dyspnea scale       Expected Outcomes  Short Term: Able to use Dyspnea scale daily in rehab to express subjective sense of shortness of breath  during exertion;Long Term: Able to use Dyspnea scale to guide intensity level when exercising independently       Knowledge and understanding of Target Heart Rate Range (THRR)  Yes       Intervention  Provide education and explanation of THRR including how the numbers were predicted and where they are located for reference       Expected Outcomes  Short Term: Able to state/look up THRR;Short Term: Able to use daily as guideline for intensity in rehab;Long Term: Able to use THRR to govern intensity when exercising independently       Able to check pulse independently  Yes       Intervention  Provide education and demonstration on how to check pulse in carotid and radial arteries.;Review the importance of being able to check your own pulse for safety during independent exercise       Expected Outcomes  Short Term: Able to explain why pulse checking is important during independent exercise;Long Term: Able to check pulse independently and accurately       Understanding of Exercise Prescription  Yes       Intervention  Provide education, explanation, and written materials on patient's individual exercise prescription       Expected Outcomes  Short Term: Able to explain program exercise prescription;Long Term: Able to explain home exercise prescription to exercise independently          Exercise Goals Re-Evaluation : Exercise Goals Re-Evaluation    Row Name 10/20/18 1132 11/02/18 1521 11/07/18 1425 11/14/18 1225 11/17/18 1531     Exercise Goal Re-Evaluation   Exercise Goals Review  Increase Physical Activity;Increase Strength and Stamina;Able to understand and use rate of perceived exertion (RPE) scale;Able to understand and use Dyspnea scale;Knowledge and understanding of Target Heart Rate Range (THRR);Understanding of Exercise Prescription  Increase Physical Activity;Increase Strength and Stamina;Understanding of Exercise Prescription  Increase Physical Activity;Increase Strength and  Stamina;Understanding of Exercise Prescription  Increase Physical Activity;Increase Strength and Stamina;Understanding of Exercise Prescription  Understanding of Exercise Prescription;Able to understand and use rate of perceived exertion (RPE) scale;Increase Physical Activity   Comments  Reviewed RPE scale, THR and program prescription with pt today.  Pt voiced understanding and was given a copy of goals to take home.  Ron is off to a good start in rehab.  He already up to 2.7 mph on the treadmill.  We will continue to monitor his progress.  Ron  is doing well with exercise. Ron is still walking at home some.  But now that he is in rehab consistently, he is having a harder time getting it in his schedule.  We talked about the importance getting in his cardio 5 days a week. Updated home exercise with pt today.  Pt plans to walk and use bike at home for exercise.  Reviewed THR, pulse, RPE, sign and symptoms, and when to call 911 or MD.  Also discussed weather considerations and indoor options.  Pt voiced understanding.  Ron continues to do well in rehab.  He is up to 29 watts on the bike and 3 METs on the BioStep.  We will continue to monitor his progress.  ROn asked questions about METS. THat was explained to him today and he verbalized understanding.  He also told staff he has been helping his friend with yardwork-increasing physical activity   Expected Outcomes  Short: Use RPE daily to regulate intensity. Long: Follow program prescription in THR.  Short: Continue to increase workloads.  Long: Continue to follow program prescription.  Short: Get back on bike at least two extra days a week.  Long: Continue to increase stamina.  Short: Increase workloads.  Long: Continue to increase strength and stamina.  Short: Increase workloads. Continue to learn about process of exercise prescription  Long: Continue to increase strength and stamina.   [place] Name 11/28/18 1456             Exercise Goal Re-Evaluation    Exercise Goals Review  Increase Physical Activity;Increase Strength and Stamina;Understanding of Exercise Prescription       Comments  Ron is doing well in rehab.  He is doing his best to walk with his dog at home daily. He also stretches regularly and continues to help a friend with yard work.  He is feeling good with his strength and stamina and continues to see improvements.       Expected Outcomes  Short: Make time to get in his walks daily.  Long: Continue to improve stamina.          Discharge Exercise Prescription (Final Exercise Prescription Changes): Exercise Prescription Changes - 12/01/18 1500      Response to Exercise   Blood Pressure (Admit)  126/62    Blood Pressure (Exercise)  130/56    Blood Pressure (Exit)  104/58    Heart Rate (Admit)  62 bpm    Heart Rate (Exercise)  90 bpm    Heart Rate (Exit)  78 bpm    Rating of Perceived Exertion (Exercise)  13    Symptoms  none    Duration  Continue with 30 min of aerobic exercise without signs/symptoms of physical distress.    Intensity  THRR unchanged      Progression   Progression  Continue to progress workloads to maintain intensity without signs/symptoms of physical distress.    Average METs  3.1      Resistance Training   Training Prescription  Yes    Weight  6 lbs    Reps  10-15      Interval Training   Interval Training  No      Treadmill   MPH  2.7    Grade  1    Minutes  15    METs  3.44      Recumbant Bike   Level  3    Watts  25    Minutes  15    METs  3.11  T5 Nustep   Level  3    Minutes  15      Biostep-RELP   Level  2    Minutes  15    METs  3      Home Exercise Plan   Plans to continue exercise at  Home (comment)   walking and biking   Frequency  Add 2 additional days to program exercise sessions.    Initial Home Exercises Provided  11/07/18       Nutrition:  Target Goals: Understanding of nutrition guidelines, daily intake of sodium '1500mg'$ , cholesterol '200mg'$ , calories 30%  from fat and 7% or less from saturated fats, daily to have 5 or more servings of fruits and vegetables.  Biometrics: Pre Biometrics - 10/18/18 1510      Pre Biometrics   Height  6' 2.2" (1.885 m)    Weight  193 lb 4.8 oz (87.7 kg)    BMI (Calculated)  24.68      Post Biometrics - 12/12/18 1456       Post  Biometrics   Height  6' 2.2" (1.885 m)    Weight  198 lb 14.4 oz (90.2 kg)    BMI (Calculated)  25.39       Nutrition Therapy Plan and Nutrition Goals: Nutrition Therapy & Goals - 10/03/18 1200      Nutrition Therapy   Diet  HH, low Na    Protein (specify units)  70g    Fiber  25 grams    Whole Grain Foods  3 servings    Saturated Fats  12 max. grams    Fruits and Vegetables  5 servings/day    Sodium  1.5 grams      Personal Nutrition Goals   Nutrition Goal  ST: continue HH eating, learn more about HH eating LT: maintian weight    Comments  Used to be very disciplined 5 years ago when his medical problems started. Tries to eat reasonably healthy and nowuses butter/olive oil combination, now eats whole wheat and whole grains, will have eggs with bacon 2x/week, banana with cereal (rice crispy and corn flakes together but sometimes will have raisin bran) and frozen fruit (like berries) x3x/week and will have applesauce on toast instead of jelly. Will normally eat B and D. Has a jar of peanuts for a snack and will have cuties or halos every once in a while. D: will grill steak every once in a while, chicken breast, frozen veggies (steams) broccoli, brussell sprouts, sweet potatoes every once in a while, corn (not much). (meat or poultry with vegetable) once and a while will have fish (bake or broil) gets at [employer] but hasn't been over there with COVID and  doesn't use mayo uses low calorie miricle whip with tuna and toast. Will have some frozen pizza or fish sticks every once in a while. will have baked beans (don't like other beans). Doesn't fry much, tries to grill it, bake  it, or broil it. will have graham crackers for a snack or some angel food cake and frozen yogurt. will has some mild constipation due to bladder pills. weight 185-189lbs, focus on goal to 182-184lbs - weighs regularly. Will use olive oil for most things, will eat greek salads and chef salads (regular dressings) and gets them out. Disscussed MyPlate and HH eating, answered all questions (corn, peas, whole wheat, gluten). Water consumption low, but will mix water with other things.      Intervention Plan   Intervention  Prescribe, educate and counsel regarding individualized specific dietary modifications aiming towards targeted core components such as weight, hypertension, lipid management, diabetes, heart failure and other comorbidities.;Nutrition handout(s) given to patient.    Expected Outcomes  Short Term Goal: A plan has been developed with personal nutrition goals set during dietitian appointment.;Long Term Goal: Adherence to prescribed nutrition plan.;Short Term Goal: Understand basic principles of dietary content, such as calories, fat, sodium, cholesterol and nutrients.       Nutrition Assessments: Nutrition Assessments - 12/12/18 1357      MEDFICTS Scores   Pre Score  21    Post Score  22    Score Difference  1       Nutrition Goals Re-Evaluation: Nutrition Goals Re-Evaluation    Row Name 11/14/18 1437 11/28/18 1456           Goals   Current Weight  192 lb 12.8 oz (87.5 kg)  194 lb 4.8 oz (88.1 kg)      Nutrition Goal  ST: continue HH eating, learn more about HH eating LT: maintian weight  ST: continue HH eating, learn more about HH eating LT: maintian weight      Comment  Pt reports "goofing off" sometimes. Pt reports eating a variety of different fruits and vegetables and continues to be very health concious. Pt with no new goals at this time. Pt also recalls what we spoke about during our initial evaluation and regularly does research to [facility] sure eating healthy.  Pt reports  "goofing off" sometimes. Pt reports eating a variety of different fruits and vegetables and continues to be very health concious. Pt with no new goals at this time. Pt also recalls what we spoke about during our initial evaluation and regularly does research to make sure to eat healthy. Pt is feeling his energy coming up since being in rehab.      Expected Outcome  ST: continue HH eating, learn more about HH eating LT: maintian weight  ST: continue HH eating, learn more about HH eating LT: maintian weight         Nutrition Goals Discharge (Final Nutrition Goals Re-Evaluation): Nutrition Goals Re-Evaluation - 11/28/18 1456      Goals   Current Weight  194 lb 4.8 oz (88.1 kg)    Nutrition Goal  ST: continue HH eating, learn more about HH eating LT: maintian weight    Comment  Pt reports "goofing off" sometimes. Pt reports eating a variety of different fruits and vegetables and continues to be very health concious. Pt with no new goals at this time. Pt also recalls what we spoke about during our initial evaluation and regularly does research to make sure to eat healthy. Pt is feeling his energy coming up since being in rehab.    Expected Outcome  ST: continue HH eating, learn more about HH eating LT: maintian weight       Psychosocial: Target Goals: Acknowledge presence or absence of significant depression and/or stress, maximize coping skills, provide positive support system. Participant is able to verbalize types and ability to use techniques and skills needed for reducing stress and depression.   Initial Review & Psychosocial Screening:   Quality of Life Scores:  Quality of Life - 12/12/18 1358      Quality of Life Scores   Health/Function Pre  24.03 %    Health/Function Post  22.53 %    Health/Function % Change  -6.24 %    Socioeconomic Pre  23.43 %  Socioeconomic Post  23.43 %    Socioeconomic % Change   0 %    Psych/Spiritual Pre  26.14 %    Psych/Spiritual Post  30 %     Psych/Spiritual % Change  14.77 %    Family Pre  27.6 %    Family Post  28.8 %    Family % Change  4.35 %    GLOBAL Pre  24.82 %    GLOBAL Post  25.12 %    GLOBAL % Change  1.21 %      Scores of 19 and below usually indicate a poorer quality of life in these areas.  A difference of  2-3 points is a clinically meaningful difference.  A difference of 2-3 points in the total score of the Quality of Life Index has been associated with significant improvement in overall quality of life, self-image, physical symptoms, and general health in studies assessing change in quality of life.  PHQ-9: Recent Review Flowsheet Data    Depression screen [facility] 2/9 11/29/2018 04/19/2018 04/19/2018 11/23/2017 10/12/2017   Decreased Interest 0 0 0 0 0   Down, Depressed, Hopeless 0 0 0 0 0   PHQ - 2 Score 0 0 0 0 0   Altered sleeping 0 0 - - -   Tired, decreased energy 0 0 - - -   Change in appetite 0 0 - - -   Feeling bad or failure about yourself  0 0 - - -   Trouble concentrating 0 0 - - -   Moving slowly or fidgety/restless 0 0 - - -   Suicidal thoughts 0 0 - - -   PHQ-9 Score 0 0 - - -   Difficult doing work/chores Not difficult at all Not difficult at all - - -     Interpretation of Total Score  Total Score Depression Severity:  1-4 = Minimal depression, 5-9 = Mild depression, 10-14 = Moderate depression, 15-19 = Moderately severe depression, 20-27 = Severe depression   Psychosocial Evaluation and Intervention:   Psychosocial Re-Evaluation: Psychosocial Re-Evaluation    Row Name 11/07/18 1452 11/28/18 1500           Psychosocial Re-Evaluation   Current issues with  Current Stress Concerns  Current Stress Concerns      Comments  Ron is doing well in rehab.  He is in a good place mentally and stays busy in his retirement.  He barely has time for exercise on his off days will start to try to make more time.   He sleeps well most night.  Overall, he is generally postive.  Ron is doing well in rehab. He  is handling the virus shut downs well.  Overall, he is a positive person and always looking for the good in things.  In general, he sleeps well, but he does stay up late.  He does worry about some of his family's health problems, but does his best to try not to let it get to him.      Expected Outcomes  Short: Continue to find time to exercise.  Long: Continue to stay postive.  Short: Continue to make time to exercise.  Long: Continue to stay positive.      Interventions  Encouraged to attend Cardiac Rehabilitation for the exercise  Encouraged to attend Cardiac Rehabilitation for the exercise      Continue Psychosocial Services   Follow up required by staff  Follow up required by staff  Psychosocial Discharge (Final Psychosocial Re-Evaluation): Psychosocial Re-Evaluation - 11/28/18 1500      Psychosocial Re-Evaluation   Current issues with  Current Stress Concerns    Comments  Ron is doing well in rehab. He is handling the virus shut downs well.  Overall, he is a positive person and always looking for the good in things.  In general, he sleeps well, but he does stay up late.  He does worry about some of his family's health problems, but does his best to try not to let it get to him.    Expected Outcomes  Short: Continue to make time to exercise.  Long: Continue to stay positive.    Interventions  Encouraged to attend Cardiac Rehabilitation for the exercise    Continue Psychosocial Services   Follow up required by staff       Vocational Rehabilitation: Provide vocational rehab assistance to qualifying candidates.   Vocational Rehab Evaluation & Intervention:   Education: Education Goals: Education classes will be provided on a variety of topics geared toward better understanding of heart health and risk factor modification. Participant will state understanding/return demonstration of topics presented as noted by education test scores.  Learning Barriers/Preferences:   Education  Topics:  AED/CPR: - Group verbal and written instruction with the use of models to demonstrate the basic use of the AED with the basic ABC's of resuscitation.   General Nutrition Guidelines/Fats and Fiber: -Group instruction provided by verbal, written material, models and posters to present the general guidelines for heart healthy nutrition. Gives an explanation and review of dietary fats and fiber.   Controlling Sodium/Reading Food Labels: -Group verbal and written material supporting the discussion of sodium use in heart healthy nutrition. Review and explanation with models, verbal and written materials for utilization of the food label.   Exercise Physiology & General Exercise Guidelines: - Group verbal and written instruction with models to review the exercise physiology of the cardiovascular system and associated critical values. Provides general exercise guidelines with specific guidelines to those with heart or lung disease.    Aerobic Exercise & Resistance Training: - Gives group verbal and written instruction on the various components of exercise. Focuses on aerobic and resistive training programs and the benefits of this training and how to safely progress through these programs..   Flexibility, Balance, Mind/Body Relaxation: Provides group verbal/written instruction on the benefits of flexibility and balance training, including mind/body exercise modes such as yoga, pilates and tai chi.  Demonstration and skill practice provided.   Stress and Anxiety: - Provides group verbal and written instruction about the health risks of elevated stress and causes of high stress.  Discuss the correlation between heart/lung disease and anxiety and treatment options. Review healthy ways to manage with stress and anxiety.   Depression: - Provides group verbal and written instruction on the correlation between heart/lung disease and depressed mood, treatment options, and the stigmas  associated with seeking treatment.   Anatomy & Physiology of the Heart: - Group verbal and written instruction and models provide basic cardiac anatomy and physiology, with the coronary electrical and arterial systems. Review of Valvular disease and Heart Failure   Cardiac Procedures: - Group verbal and written instruction to review commonly prescribed medications for heart disease. Reviews the medication, class of the drug, and side effects. Includes the steps to properly store meds and maintain the prescription regimen. (beta blockers and nitrates)   Cardiac Medications I: - Group verbal and written instruction to review commonly prescribed medications  for heart disease. Reviews the medication, class of the drug, and side effects. Includes the steps to properly store meds and maintain the prescription regimen.   Cardiac Medications II: -Group verbal and written instruction to review commonly prescribed medications for heart disease. Reviews the medication, class of the drug, and side effects. (all other drug classes)    Go Sex-Intimacy & Heart Disease, Get SMART - Goal Setting: - Group verbal and written instruction through game format to discuss heart disease and the return to sexual intimacy. Provides group verbal and written material to discuss and apply goal setting through the application of the S.M.A.R.T. Method.   Other Matters of the Heart: - Provides group verbal, written materials and models to describe Stable Angina and Peripheral Artery. Includes description of the disease process and treatment options available to the cardiac patient.   Exercise & Equipment Safety: - Individual verbal instruction and demonstration of equipment use and safety with use of the equipment.   Cardiac Rehab from 10/18/2018 in [facility] Cardiac and Pulmonary Rehab  Date  10/18/18  Educator  [facility]  Instruction Review Code  1- Verbalizes Understanding      Infection Prevention: - Provides verbal and  written material to individual with discussion of infection control including proper hand washing and proper equipment cleaning during exercise session.   Cardiac Rehab from 10/18/2018 in [facility] Cardiac and Pulmonary Rehab  Date  10/18/18  Educator  [facility]  Instruction Review Code  1- Verbalizes Understanding      Falls Prevention: - Provides verbal and written material to individual with discussion of falls prevention and safety.   Cardiac Rehab from 10/18/2018 in [facility] Cardiac and Pulmonary Rehab  Date  10/18/18  Educator  [facility]  Instruction Review Code  1- Verbalizes Understanding      Diabetes: - Individual verbal and written instruction to review signs/symptoms of diabetes, desired ranges of glucose level fasting, after meals and with exercise. Acknowledge that pre and post exercise glucose checks will be done for 3 sessions at entry of program.   Know Your Numbers and Risk Factors: -Group verbal and written instruction about important numbers in your health.  Discussion of what are risk factors and how they play a role in the disease process.  Review of Cholesterol, Blood Pressure, Diabetes, and BMI and the role they play in your overall health.   Sleep Hygiene: -Provides group verbal and written instruction about how sleep can affect your health.  Define sleep hygiene, discuss sleep cycles and impact of sleep habits. Review good sleep hygiene tips.    Other: -Provides group and verbal instruction on various topics (see comments)   Knowledge Questionnaire Score: Knowledge Questionnaire Score - 12/12/18 1358      Knowledge Questionnaire Score   Pre Score  24/26    Post Score  25/26       Core Components/Risk Factors/Patient Goals at Admission: Personal Goals and Risk Factors at Admission - 10/18/18 1510      Core Components/Risk Factors/Patient Goals on Admission    Weight Management  Yes;Weight Maintenance    Intervention  Weight Management: Develop a combined nutrition and  exercise program designed to reach desired caloric intake, while maintaining appropriate intake of nutrient and fiber, sodium and fats, and appropriate energy expenditure required for the weight goal.;Weight Management: Provide education and appropriate resources to help participant work on and attain dietary goals.    Admit Weight  193 lb 4.8 oz (87.7 kg)    Goal Weight: Short Term  193 lb (87.5 kg)    Goal Weight: Long Term  193 lb (87.5 kg)    Expected Outcomes  Short Term: Continue to assess and modify interventions until short term weight is achieved;Long Term: Adherence to nutrition and physical activity/exercise program aimed toward attainment of established weight goal;Weight Maintenance: Understanding of the daily nutrition guidelines, which includes 25-35% calories from fat, 7% or less cal from saturated fats, less than '200mg'$  cholesterol, less than 1.5gm of sodium, & 5 or more servings of fruits and vegetables daily    Heart Failure  Yes    Intervention  Provide a combined exercise and nutrition program that is supplemented with education, support and counseling about heart failure. Directed toward relieving symptoms such as shortness of breath, decreased exercise tolerance, and extremity edema.    Expected Outcomes  Improve functional capacity of life;Short term: Attendance in program 2-3 days a week with increased exercise capacity. Reported lower sodium intake. Reported increased fruit and vegetable intake. Reports medication compliance.;Short term: Daily weights obtained and reported for increase. Utilizing diuretic protocols set by physician.;Long term: Adoption of self-care skills and reduction of barriers for early signs and symptoms recognition and intervention leading to self-care maintenance.    Hypertension  Yes    Intervention  Provide education on lifestyle modifcations including regular physical activity/exercise, weight management, moderate sodium restriction and increased  consumption of fresh fruit, vegetables, and low fat dairy, alcohol moderation, and smoking cessation.;Monitor prescription use compliance.    Expected Outcomes  Short Term: Continued assessment and intervention until BP is < 140/78m HG in hypertensive participants. < 130/833mHG in hypertensive participants with diabetes, heart failure or chronic kidney disease.;Long Term: Maintenance of blood pressure at goal levels.       Core Components/Risk Factors/Patient Goals Review:  Goals and Risk Factor Review    Row Name 11/07/18 1454 11/28/18 1503           Core Components/Risk Factors/Patient Goals Review   Personal Goals Review  Weight Management/Obesity;Hypertension;Heart Failure  Weight Management/Obesity;Hypertension;Heart Failure      Review  Ron is doing well and staying steady around 180 lbs with his pounds.  He denies any symptoms of heart failure.  He is doing well with his blood pressures overall.  Ron is doing well.  His weight continues to stay steady around 180 lbs. He is able to cheat some days, but gets that right back down.  His pressures have been good and can usually range around 110/60s resting.  He denies symptoms of heart failue and has not had issues with it.  His next echo is a year out since he doing so well.      Expected Outcomes  Short: Continue to monitor weight.  Long: Continue to monitor risk factors.  Short: Continue to monitor weight.  Long: Continue manage heart failure.         Core Components/Risk Factors/Patient Goals at Discharge (Final Review):  Goals and Risk Factor Review - 11/28/18 1503      Core Components/Risk Factors/Patient Goals Review   Personal Goals Review  Weight Management/Obesity;Hypertension;Heart Failure    Review  Ron is doing well.  His weight continues to stay steady around 180 lbs. He is able to cheat some days, but gets that right back down.  His pressures have been good and can usually range around 110/60s resting.  He denies symptoms  of heart failue and has not had issues with it.  His next echo is a year out since he  doing so well.    Expected Outcomes  Short: Continue to monitor weight.  Long: Continue manage heart failure.       ITP Comments: ITP Comments    Row Name 09/27/18 1610 10/03/18 1259 10/18/18 1506 10/26/18 1421 11/23/18 0640   ITP Comments  Virtual Cardiac Rehab visit completed. Consent and Intake completed.  Appointments made for EP and RD  nutrition consult completed  Completed intial 6MWT and completed intake.  Documentation for diagnosis can be found in [facility] encounter from 09/13/2018.  ITP created and sent for review to medical director, Dr. Emily Filbert.  30 day review cycle restarting  after being closed since March 16 because of  Covid 19 pandemic. Program opened to patients on July 6. Not all have returned. ITP updated and sent to Medical Director for review,changes as needed and signature  30 Day Review Completed today. Continue with ITP unless changed by Medical Director review.   [place] Name 11/28/18 1506 12/15/18 1611         ITP Comments  He would like to graduate early at 41 sessions since he is already into a good routine.  Ron's Heart rate was consistently at 140 during exercise. He was asymptomatic. During cool down, his heart rate stayed the same. Measures to slow heart rate down were made without success. MD was notified and advised patient to go to ED for assessment. Staff brought him upstairs to ED and passed along the rhythm strips.         Comments: Discharge ITP

## 2018-12-15 NOTE — Progress Notes (Signed)
Discharge Progress Report  Patient Details  Name: James Cardenas MRN: [id number] Date of Birth: 1940-11-15 Referring Provider:     Cardiac Rehab from 10/18/2018 in [facility]  Referring Provider  Sherren Mocha MD       Number of Visits: 24  Reason for Discharge:  Patient reached a stable level of exercise. Patient independent in their exercise. Patient has met program and personal goals.  Smoking History:  Social History   Tobacco Use  Smoking Status Never Smoker  Smokeless Tobacco Never Used    Diagnosis:  S/P TAVR (transcatheter aortic valve replacement)  ADL UCSD:   Initial Exercise Prescription: Initial Exercise Prescription - 10/18/18 1500      Date of Initial Exercise RX and Referring Provider   Date  10/18/18    Referring Provider  Sherren Mocha MD      Treadmill   MPH  2.5    Grade  1    Minutes  15    METs  3.26      Recumbant Bike   Level  2    RPM  50    Watts  36    Minutes  15    METs  3.2      NuStep   Level  3    SPM  80    Minutes  15    METs  3.2      REL-XR   Level  2    Speed  50    Minutes  15    METs  3.2      T5 Nustep   Level  3    SPM  80    Minutes  15    METs  3.2      Prescription Details   Frequency (times per week)  3    Duration  Progress to 30 minutes of continuous aerobic without signs/symptoms of physical distress      Intensity   THRR 40-80% of Max Heartrate  94-127    Ratings of Perceived Exertion  11-13    Perceived Dyspnea  0-4      Progression   Progression  Continue to progress workloads to maintain intensity without signs/symptoms of physical distress.      Resistance Training   Training Prescription  Yes    Weight  4 lbs    Reps  10-15       Discharge Exercise Prescription (Final Exercise Prescription Changes): Exercise Prescription Changes - 12/01/18 1500      Response to Exercise   Blood Pressure (Admit)  126/62    Blood Pressure (Exercise)  130/56    Blood  Pressure (Exit)  104/58    Heart Rate (Admit)  62 bpm    Heart Rate (Exercise)  90 bpm    Heart Rate (Exit)  78 bpm    Rating of Perceived Exertion (Exercise)  13    Symptoms  none    Duration  Continue with 30 min of aerobic exercise without signs/symptoms of physical distress.    Intensity  THRR unchanged      Progression   Progression  Continue to progress workloads to maintain intensity without signs/symptoms of physical distress.    Average METs  3.1      Resistance Training   Training Prescription  Yes    Weight  6 lbs    Reps  10-15      Interval Training   Interval Training  No  Treadmill   MPH  2.7    Grade  1    Minutes  15    METs  3.44      Recumbant Bike   Level  3    Watts  25    Minutes  15    METs  3.11      T5 Nustep   Level  3    Minutes  15      Biostep-RELP   Level  2    Minutes  15    METs  3      Home Exercise Plan   Plans to continue exercise at  Home (comment)   walking and biking   Frequency  Add 2 additional days to program exercise sessions.    Initial Home Exercises Provided  11/07/18       Functional Capacity: 6 Minute Walk    Row Name 10/18/18 1647 12/12/18 1455       6 Minute Walk   Phase  Initial  Discharge    Distance  1402 feet  1405 feet    Distance % Change  -  -0.02 %    Distance Feet Change  -  -3 ft    Walk Time  6 minutes  6 minutes    # of Rest Breaks  0  0    MPH  2.66  2.66    METS  3.29  3.19    RPE  9  12    Perceived Dyspnea   1  -    VO2 Peak  11.53  11.18    Symptoms  No  No    Resting HR  61 bpm  66 bpm    Resting BP  134/72  128/70    Resting Oxygen Saturation   97 %  -    Exercise Oxygen Saturation  during 6 min walk  98 %  -    Max Ex. HR  109 bpm  102 bpm    Max Ex. BP  156/66  160/70    2 Minute Post BP  134/72 Recheck 134/70  -       Psychological, QOL, Others - Outcomes: PHQ 2/9: Depression screen [facility] 2/9 11/29/2018 04/19/2018 04/19/2018 11/23/2017 10/12/2017  Decreased Interest 0 0 0  0 0  Down, Depressed, Hopeless 0 0 0 0 0  PHQ - 2 Score 0 0 0 0 0  Altered sleeping 0 0 - - -  Tired, decreased energy 0 0 - - -  Change in appetite 0 0 - - -  Feeling bad or failure about yourself  0 0 - - -  Trouble concentrating 0 0 - - -  Moving slowly or fidgety/restless 0 0 - - -  Suicidal thoughts 0 0 - - -  PHQ-9 Score 0 0 - - -  Difficult doing work/chores Not difficult at all Not difficult at all - - -    Quality of Life: Quality of Life - 12/12/18 1358      Quality of Life Scores   Health/Function Pre  24.03 %    Health/Function Post  22.53 %    Health/Function % Change  -6.24 %    Socioeconomic Pre  23.43 %    Socioeconomic Post  23.43 %    Socioeconomic % Change   0 %    Psych/Spiritual Pre  26.14 %    Psych/Spiritual Post  30 %    Psych/Spiritual % Change  14.77 %  Family Pre  27.6 %    Family Post  28.8 %    Family % Change  4.35 %    GLOBAL Pre  24.82 %    GLOBAL Post  25.12 %    GLOBAL % Change  1.21 %       Personal Goals: Goals established at orientation with interventions provided to work toward goal. Personal Goals and Risk Factors at Admission - 10/18/18 1510      Core Components/Risk Factors/Patient Goals on Admission    Weight Management  Yes;Weight Maintenance    Intervention  Weight Management: Develop a combined nutrition and exercise program designed to reach desired caloric intake, while maintaining appropriate intake of nutrient and fiber, sodium and fats, and appropriate energy expenditure required for the weight goal.;Weight Management: Provide education and appropriate resources to help participant work on and attain dietary goals.    Admit Weight  193 lb 4.8 oz (87.7 kg)    Goal Weight: Short Term  193 lb (87.5 kg)    Goal Weight: Long Term  193 lb (87.5 kg)    Expected Outcomes  Short Term: Continue to assess and modify interventions until short term weight is achieved;Long Term: Adherence to nutrition and physical activity/exercise  program aimed toward attainment of established weight goal;Weight Maintenance: Understanding of the daily nutrition guidelines, which includes 25-35% calories from fat, 7% or less cal from saturated fats, less than 259m cholesterol, less than 1.5gm of sodium, & 5 or more servings of fruits and vegetables daily    Heart Failure  Yes    Intervention  Provide a combined exercise and nutrition program that is supplemented with education, support and counseling about heart failure. Directed toward relieving symptoms such as shortness of breath, decreased exercise tolerance, and extremity edema.    Expected Outcomes  Improve functional capacity of life;Short term: Attendance in program 2-3 days a week with increased exercise capacity. Reported lower sodium intake. Reported increased fruit and vegetable intake. Reports medication compliance.;Short term: Daily weights obtained and reported for increase. Utilizing diuretic protocols set by physician.;Long term: Adoption of self-care skills and reduction of barriers for early signs and symptoms recognition and intervention leading to self-care maintenance.    Hypertension  Yes    Intervention  Provide education on lifestyle modifcations including regular physical activity/exercise, weight management, moderate sodium restriction and increased consumption of fresh fruit, vegetables, and low fat dairy, alcohol moderation, and smoking cessation.;Monitor prescription use compliance.    Expected Outcomes  Short Term: Continued assessment and intervention until BP is < 140/985mHG in hypertensive participants. < 130/8025mG in hypertensive participants with diabetes, heart failure or chronic kidney disease.;Long Term: Maintenance of blood pressure at goal levels.        Personal Goals Discharge: Goals and Risk Factor Review    Row Name 11/07/18 1454 11/28/18 1503           Core Components/Risk Factors/Patient Goals Review   Personal Goals Review  Weight  Management/Obesity;Hypertension;Heart Failure  Weight Management/Obesity;Hypertension;Heart Failure      Review  Ron is doing well and staying steady around 180 lbs with his pounds.  He denies any symptoms of heart failure.  He is doing well with his blood pressures overall.  Ron is doing well.  His weight continues to stay steady around 180 lbs. He is able to cheat some days, but gets that right back down.  His pressures have been good and can usually range around 110/60s resting.  He denies symptoms  of heart failue and has not had issues with it.  His next echo is a year out since he doing so well.      Expected Outcomes  Short: Continue to monitor weight.  Long: Continue to monitor risk factors.  Short: Continue to monitor weight.  Long: Continue manage heart failure.         Exercise Goals and Review: Exercise Goals    Row Name 10/04/18 1132             Exercise Goals   Increase Physical Activity  Yes       Intervention  Provide advice, education, support and counseling about physical activity/exercise needs.;Develop an individualized exercise prescription for aerobic and resistive training based on initial evaluation findings, risk stratification, comorbidities and participant's personal goals.       Expected Outcomes  Short Term: Attend rehab on a regular basis to increase amount of physical activity.;Long Term: Add in home exercise to make exercise part of routine and to increase amount of physical activity.;Long Term: Exercising regularly at least 3-5 days a week.       Increase Strength and Stamina  Yes       Intervention  Provide advice, education, support and counseling about physical activity/exercise needs.;Develop an individualized exercise prescription for aerobic and resistive training based on initial evaluation findings, risk stratification, comorbidities and participant's personal goals.       Expected Outcomes  Short Term: Increase workloads from initial exercise prescription  for resistance, speed, and METs.;Short Term: Perform resistance training exercises routinely during rehab and add in resistance training at home;Long Term: Improve cardiorespiratory fitness, muscular endurance and strength as measured by increased METs and functional capacity (6MWT)       Able to understand and use rate of perceived exertion (RPE) scale  Yes       Intervention  Provide education and explanation on how to use RPE scale       Expected Outcomes  Short Term: Able to use RPE daily in rehab to express subjective intensity level;Long Term:  Able to use RPE to guide intensity level when exercising independently       Able to understand and use Dyspnea scale  Yes       Intervention  Provide education and explanation on how to use Dyspnea scale       Expected Outcomes  Short Term: Able to use Dyspnea scale daily in rehab to express subjective sense of shortness of breath during exertion;Long Term: Able to use Dyspnea scale to guide intensity level when exercising independently       Knowledge and understanding of Target Heart Rate Range (THRR)  Yes       Intervention  Provide education and explanation of THRR including how the numbers were predicted and where they are located for reference       Expected Outcomes  Short Term: Able to state/look up THRR;Short Term: Able to use daily as guideline for intensity in rehab;Long Term: Able to use THRR to govern intensity when exercising independently       Able to check pulse independently  Yes       Intervention  Provide education and demonstration on how to check pulse in carotid and radial arteries.;Review the importance of being able to check your own pulse for safety during independent exercise       Expected Outcomes  Short Term: Able to explain why pulse checking is important during independent exercise;Long Term: Able to check pulse independently  and accurately       Understanding of Exercise Prescription  Yes       Intervention  Provide  education, explanation, and written materials on patient's individual exercise prescription       Expected Outcomes  Short Term: Able to explain program exercise prescription;Long Term: Able to explain home exercise prescription to exercise independently          Exercise Goals Re-Evaluation: Exercise Goals Re-Evaluation    Row Name 10/20/18 1132 11/02/18 1521 11/07/18 1425 11/14/18 1225 11/17/18 1531     Exercise Goal Re-Evaluation   Exercise Goals Review  Increase Physical Activity;Increase Strength and Stamina;Able to understand and use rate of perceived exertion (RPE) scale;Able to understand and use Dyspnea scale;Knowledge and understanding of Target Heart Rate Range (THRR);Understanding of Exercise Prescription  Increase Physical Activity;Increase Strength and Stamina;Understanding of Exercise Prescription  Increase Physical Activity;Increase Strength and Stamina;Understanding of Exercise Prescription  Increase Physical Activity;Increase Strength and Stamina;Understanding of Exercise Prescription  Understanding of Exercise Prescription;Able to understand and use rate of perceived exertion (RPE) scale;Increase Physical Activity   Comments  Reviewed RPE scale, THR and program prescription with pt today.  Pt voiced understanding and was given a copy of goals to take home.  Ron is off to a good start in rehab.  He already up to 2.7 mph on the treadmill.  We will continue to monitor his progress.  Ron is doing well with exercise. Ron is still walking at home some.  But now that he is in rehab consistently, he is having a harder time getting it in his schedule.  We talked about the importance getting in his cardio 5 days a week. Updated home exercise with pt today.  Pt plans to walk and use bike at home for exercise.  Reviewed THR, pulse, RPE, sign and symptoms, and when to call 911 or MD.  Also discussed weather considerations and indoor options.  Pt voiced understanding.  Ron continues to do well in  rehab.  He is up to 29 watts on the bike and 3 METs on the BioStep.  We will continue to monitor his progress.  ROn asked questions about METS. THat was explained to him today and he verbalized understanding.  He also told staff he has been helping his friend with yardwork-increasing physical activity   Expected Outcomes  Short: Use RPE daily to regulate intensity. Long: Follow program prescription in THR.  Short: Continue to increase workloads.  Long: Continue to follow program prescription.  Short: Get back on bike at least two extra days a week.  Long: Continue to increase stamina.  Short: Increase workloads.  Long: Continue to increase strength and stamina.  Short: Increase workloads. Continue to learn about process of exercise prescription  Long: Continue to increase strength and stamina.   [place] Name 11/28/18 1456             Exercise Goal Re-Evaluation   Exercise Goals Review  Increase Physical Activity;Increase Strength and Stamina;Understanding of Exercise Prescription       Comments  Ron is doing well in rehab.  He is doing his best to walk with his dog at home daily. He also stretches regularly and continues to help a friend with yard work.  He is feeling good with his strength and stamina and continues to see improvements.       Expected Outcomes  Short: Make time to get in his walks daily.  Long: Continue to improve stamina.  Nutrition & Weight - Outcomes: Pre Biometrics - 10/18/18 1510      Pre Biometrics   Height  6' 2.2" (1.885 m)    Weight  193 lb 4.8 oz (87.7 kg)    BMI (Calculated)  24.68      Post Biometrics - 12/12/18 1456       Post  Biometrics   Height  6' 2.2" (1.885 m)    Weight  198 lb 14.4 oz (90.2 kg)    BMI (Calculated)  25.39       Nutrition: Nutrition Therapy & Goals - 10/03/18 1200      Nutrition Therapy   Diet  HH, low Na    Protein (specify units)  70g    Fiber  25 grams    Whole Grain Foods  3 servings    Saturated Fats  12 max. grams     Fruits and Vegetables  5 servings/day    Sodium  1.5 grams      Personal Nutrition Goals   Nutrition Goal  ST: continue HH eating, learn more about HH eating LT: maintian weight    Comments  Used to be very disciplined 5 years ago when his medical problems started. Tries to eat reasonably healthy and nowuses butter/olive oil combination, now eats whole wheat and whole grains, will have eggs with bacon 2x/week, banana with cereal (rice crispy and corn flakes together but sometimes will have raisin bran) and frozen fruit (like berries) x3x/week and will have applesauce on toast instead of jelly. Will normally eat B and D. Has a jar of peanuts for a snack and will have cuties or halos every once in a while. D: will grill steak every once in a while, chicken breast, frozen veggies (steams) broccoli, brussell sprouts, sweet potatoes every once in a while, corn (not much). (meat or poultry with vegetable) once and a while will have fish (bake or broil) gets at [employer] but hasn't been over there with COVID and  doesn't use mayo uses low calorie miricle whip with tuna and toast. Will have some frozen pizza or fish sticks every once in a while. will have baked beans (don't like other beans). Doesn't fry much, tries to grill it, bake it, or broil it. will have graham crackers for a snack or some angel food cake and frozen yogurt. will has some mild constipation due to bladder pills. weight 185-189lbs, focus on goal to 182-184lbs - weighs regularly. Will use olive oil for most things, will eat greek salads and chef salads (regular dressings) and gets them out. Disscussed MyPlate and HH eating, answered all questions (corn, peas, whole wheat, gluten). Water consumption low, but will mix water with other things.      Intervention Plan   Intervention  Prescribe, educate and counsel regarding individualized specific dietary modifications aiming towards targeted core components such as weight, hypertension, lipid  management, diabetes, heart failure and other comorbidities.;Nutrition handout(s) given to patient.    Expected Outcomes  Short Term Goal: A plan has been developed with personal nutrition goals set during dietitian appointment.;Long Term Goal: Adherence to prescribed nutrition plan.;Short Term Goal: Understand basic principles of dietary content, such as calories, fat, sodium, cholesterol and nutrients.       Nutrition Discharge: Nutrition Assessments - 12/12/18 1357      MEDFICTS Scores   Pre Score  21    Post Score  22    Score Difference  1       Education Questionnaire Score:  Knowledge Questionnaire Score - 12/12/18 1358      Knowledge Questionnaire Score   Pre Score  24/26    Post Score  25/26       Goals reviewed with patient; copy given to patient.

## 2018-12-16 ENCOUNTER — Telehealth: Payer: Self-pay | Admitting: Cardiovascular Disease

## 2018-12-16 DIAGNOSIS — Z952 Presence of prosthetic heart valve: Secondary | ICD-10-CM | POA: Diagnosis not present

## 2018-12-16 DIAGNOSIS — I483 Typical atrial flutter: Secondary | ICD-10-CM

## 2018-12-16 DIAGNOSIS — I4891 Unspecified atrial fibrillation: Secondary | ICD-10-CM | POA: Diagnosis not present

## 2018-12-16 DIAGNOSIS — F419 Anxiety disorder, unspecified: Secondary | ICD-10-CM | POA: Diagnosis not present

## 2018-12-16 LAB — CBC
HCT: 42.1 % (ref 39.0–52.0)
Hemoglobin: 14.3 g/dL (ref 13.0–17.0)
MCH: 31.9 pg (ref 26.0–34.0)
MCHC: 34 g/dL (ref 30.0–36.0)
MCV: 94 fL (ref 80.0–100.0)
Platelets: 147 10*3/uL — ABNORMAL LOW (ref 150–400)
RBC: 4.48 MIL/uL (ref 4.22–5.81)
RDW: 13 % (ref 11.5–15.5)
WBC: 4.7 10*3/uL (ref 4.0–10.5)
nRBC: 0 % (ref 0.0–0.2)

## 2018-12-16 LAB — BASIC METABOLIC PANEL
Anion gap: 8 (ref 5–15)
BUN: 22 mg/dL (ref 8–23)
CO2: 22 mmol/L (ref 22–32)
Calcium: 8.6 mg/dL — ABNORMAL LOW (ref 8.9–10.3)
Chloride: 109 mmol/L (ref 98–111)
Creatinine, Ser: 0.76 mg/dL (ref 0.61–1.24)
GFR calc Af Amer: >60 mL/min (ref >60)
GFR calc non Af Amer: >60 mL/min (ref >60)
Glucose, Bld: 96 mg/dL (ref 70–99)
Potassium: 3.8 mmol/L (ref 3.5–5.1)
Sodium: 139 mmol/L (ref 135–145)

## 2018-12-16 LAB — TSH: TSH: 2.066 u[IU]/mL (ref 0.350–4.500)

## 2018-12-16 LAB — MAGNESIUM: Magnesium: 2.1 mg/dL (ref 1.7–2.4)

## 2018-12-16 MED ORDER — APIXABAN 5 MG PO TABS: 5.0000 mg | ORAL_TABLET | Freq: Two times a day (BID) | ORAL | 1 refills | Status: DC

## 2018-12-16 MED ORDER — DILTIAZEM HCL ER COATED BEADS 120 MG PO CP24: 120.0000 mg | ORAL_CAPSULE | Freq: Every day | ORAL | 1 refills | Status: DC

## 2018-12-16 MED ORDER — DILTIAZEM HCL ER COATED BEADS 120 MG PO CP24
120.0000 mg | ORAL_CAPSULE | Freq: Once | ORAL | Status: AC
  Administered 2018-12-16: 120 mg via ORAL
  Filled 2018-12-16: qty 1

## 2018-12-16 MED ORDER — APIXABAN 5 MG PO TABS: 5.0000 mg | ORAL_TABLET | Freq: Two times a day (BID) | ORAL | Status: DC

## 2018-12-16 MED ORDER — DILTIAZEM HCL ER COATED BEADS 180 MG PO CP24: 180.0000 mg | ORAL_CAPSULE | Freq: Every day | ORAL | 2 refills | Status: DC

## 2018-12-16 NOTE — Consult Note (Signed)
Cardiology Consultation:   Patient ID: James Cardenas MRN: [id number]; DOB: [birth year]  Admit date: 12/15/2018 Date of Consult: 12/16/2018  Primary Care Provider: Olin Hauser, DO Primary Cardiologist: Dr. Rockey Situ Primary Electrophysiologist:  None    Patient Profile:   James Cardenas is a 78 y.o. male with a hx of HTN, MGUS, pulmonary HTN, carotid artery dz, neuropathy and mild gait imbalance, severe AS and moderate AI s/p TAVR (09/13/18) who is being seen today for the evaluation of new onset atrial fibrillation s/p TAVR at the request of Dr. Vianne Bulls.  History of Present Illness:   [name] is a 78 yo male with PMH as above.  Starting in early 2020, he developed exertional fatigue and chest tightness, as well as ankle and pedal edema. He was not as active as in the past, due to his slow peripheral neuropathy affecting his balance. 04/29/2018 echo showed severely calcified aortic valve with mean gradient of 37 mm HG and peak gradient of 62 mm HG, moderate AI, LVEF 55% with moderate LVH. He underwent R/L cardiac cath 07/28/2018 that showed no significant CAD and normal right heart pressures.   He underwent TAVR 09/13/2018 with post operative echo EF 55%, normally functioning TAVR. He was discharged on ASA and Plavix. He was seen 10/20/2018 and denied CP, SOB, LEE, palpitations, orthopnea, and presyncope. No s/sx of bleeding. He was working with cardiac rehab and remaining active.  On 12/07/2018, the patient called into the TAVR office to report excessive bleeding with Plavix after he cut himself shaving. Recommendation at that time was to stop Plavix and continue ASA 81mg  daily.   Yesterday, the office received a phone call from Cardiac Rehab reporting that the patient came in with HR 140bpm with patient reporting rate of 62bpm at home earlier in the day. SBP 118-130s. Patient was reportedly asx. Telemetry readings were brought to the office and reviewed by the office PA with  recommendation to report to [facility] ED as it was unclear if the patient was in atrial flutter and would need further evaluation and possible TEE/DCCV.  Heart Pathway Score:     Past Medical History:  Diagnosis Date  . Anxiety   . Arthritis of knee   . Carotid artery disease ([place])   . 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 Coronary artery disease   . Hearing loss    wears hearing aids  . Hypertension   . Neuropathy   . S/P TAVR (transcatheter aortic valve replacement)    26 mm Edwards Sapien THV via the TF approach   . Severe aortic stenosis     Past Surgical History:  Procedure Laterality Date  . COLONOSCOPY    . EYE SURGERY Bilateral    cataract  . HERNIA REPAIR Left    Inguinal  . NECK SURGERY  2011   Cervical Fusion  . RIGHT HEART CATH AND CORONARY ANGIOGRAPHY N/A 07/28/2018   Procedure: RIGHT HEART CATH AND CORONARY ANGIOGRAPHY;  Surgeon: Minna Merritts, MD;  Location: [place] [facility];  Service: Cardiovascular;  Laterality: N/A;  . TEE WITHOUT CARDIOVERSION N/A 09/13/2018   Procedure: TRANSESOPHAGEAL ECHOCARDIOGRAM (TEE);  Surgeon: Sherren Mocha, MD;  Location: [place];  Service: Open Heart Surgery;  Laterality: N/A;  . TRANSCATHETER AORTIC VALVE REPLACEMENT, TRANSFEMORAL  09/13/2018  . TRANSCATHETER AORTIC VALVE REPLACEMENT, TRANSFEMORAL N/A 09/13/2018   Procedure: TRANSCATHETER AORTIC VALVE REPLACEMENT, TRANSFEMORAL;  Surgeon: Sherren Mocha, MD;  Location: [place];  Service: Open Heart Surgery;  Laterality: N/A;     Home Medications:  Prior to Admission medications   Medication Sig Start Date End Date Taking? Authorizing Provider  Alpha-Lipoic Acid 600 MG CAPS Take 600 mg by mouth every morning.    Yes [provider]  ALPRAZolam (XANAX) 0.5 MG tablet Take 1 tablet (0.5 mg total) by mouth 2 (two) times daily as needed for anxiety. 08/16/18  Yes Karamalegos, Devonne Doughty, DO  amoxicillin (AMOXIL) 500 MG tablet Take 2,000 mg (4 pills) one hour prior to all dental  visits. There are enough for two visits in this bottle. 09/21/18  Yes Eileen Stanford, PA-C  aspirin EC 81 MG tablet Take 81 mg by mouth every evening.    Yes [provider]  Azelaic Acid 15 % cream Apply 1 application topically daily. After skin is thoroughly washed and patted dry, gently but thoroughly massage a thin film of azelaic acid cream into the affected area once daily   Yes [provider]  Carboxymethylcellul-Glycerin (LUBRICATING EYE DROPS OP) Place 1 drop into both eyes 2 (two) times a day.    Yes [provider]  cholecalciferol (VITAMIN D3) 25 MCG (1000 UT) tablet Take 1,000 Units by mouth daily.   Yes [provider]  mirabegron ER (MYRBETRIQ) 50 MG TB24 tablet Take 1 tablet (50 mg total) by mouth daily. 11/15/18  Yes Stoioff, Ronda Fairly, MD  Multiple Vitamin (MULTIVITAMIN WITH MINERALS) TABS tablet Take 1 tablet by mouth daily at 3 pm. Centrum   Yes [provider]  OVER THE COUNTER MEDICATION Take 1 tablet by mouth at bedtime as needed (sleep). [place] PREVENTIVE NUTRITION TRI-SLEEP   Yes [provider]  polyethylene glycol (MIRALAX / GLYCOLAX) packet Take 17 g by mouth 2 (two) times daily as needed (constipation).    Yes [provider]  rosuvastatin (CRESTOR) 10 MG tablet Take 1 tablet (10 mg total) by mouth daily. 10/20/18 10/15/19 Yes Eileen Stanford, PA-C  sildenafil (REVATIO) 20 MG tablet Take up to 5 pills about 30 min prior to sex 10/12/17  Yes Karamalegos, Devonne Doughty, DO  telmisartan (MICARDIS) 80 MG tablet Take 1 tablet (80 mg total) by mouth daily. 10/13/18  Yes Karamalegos, Devonne Doughty, DO  tretinoin (RETIN-A) 0.1 % cream Apply 1 application topically at bedtime.  07/10/16  Yes [provider]  apixaban (ELIQUIS) 5 MG TABS tablet Take 1 tablet (5 mg total) by mouth 2 (two) times daily. 12/16/18   Epifanio Lesches, MD  diltiazem (CARDIZEM CD) 180 MG 24 hr capsule Take 1 capsule (180 mg total) by mouth daily.  12/16/18 03/16/19  Epifanio Lesches, MD    Inpatient Medications: Scheduled Meds: . apixaban  5 mg Oral BID  . diltiazem  10 mg Intravenous Once  . mirabegron ER  50 mg Oral Daily  . rosuvastatin  10 mg Oral Daily  . sodium chloride flush  3 mL Intravenous Once   Continuous Infusions:  PRN Meds: ALPRAZolam, hydrALAZINE, polyethylene glycol  Allergies:    Allergies  Allergen Reactions  . Nickel Itching    Social History:   Social History   Socioeconomic History  . Marital status: Divorced    Spouse name: Not on file  . Number of children: Not on file  . Years of education: Not on file  . Highest education level: Bachelor's degree (e.g., BA, AB, BS)  Occupational History  . Occupation: Retired Designer, multimedia, Public affairs consultant / heat treating)  Social Needs  . Financial resource strain: Not hard  at all  . Food insecurity    Worry: Never true    Inability: Never true  . Transportation needs    Medical: No    Non-medical: No  Tobacco Use  . Smoking status: Never Smoker  . Smokeless tobacco: Never Used  Substance and Sexual Activity  . Alcohol use: Yes    Alcohol/week: 7.0 standard drinks    Types: 1 Cans of beer, 6 Glasses of wine per week  . Drug use: No  . Sexual activity: Yes  Lifestyle  . Physical activity    Days per week: 3 days    Minutes per session: 30 min  . Stress: Not at all  Relationships  . Social connections    Talks on phone: More than three times a week    Gets together: More than three times a week    Attends religious service: More than 4 times per year    Active member of club or organization: No    Attends meetings of clubs or organizations: Never    Relationship status: Divorced  . Intimate partner violence    Fear of current or ex partner: No    Emotionally abused: No    Physically abused: No    Forced sexual activity: No  Other Topics Concern  . Not on file  Social History Narrative  . Not on file    Family History:    Family  History  Problem Relation Age of Onset  . Thyroid disease Mother   . Lung cancer Father   . Pancreatic cancer Brother   . Alzheimer's disease Sister   . Prostate cancer Neg Hx   . Bladder Cancer Neg Hx   . Kidney cancer Neg Hx      ROS:  Please see the history of present illness.  As above in HPI. All other ROS reviewed and negative.     Physical Exam/Data:   Vitals:   12/15/18 2047 12/16/18 0341 12/16/18 0343 12/16/18 0753  BP: (!) 158/94  103/74 (!) 128/98  Pulse: (!) 135  75 85  Resp: 16  20 19   Temp: 98 F (36.7 C)  98.4 F (36.9 C) 97.7 F (36.5 C)  TempSrc:   Oral Oral  SpO2: 98%  98% 100%  Weight: 89.3 kg 89.4 kg    Height: 6\' 3"  (1.905 m)       Intake/Output Summary (Last 24 hours) at 12/16/2018 1201 Last data filed at 12/16/2018 0341 Gross per 24 hour  Intake -  Output 0 ml  Net 0 ml   Last 3 Weights 12/16/2018 12/15/2018 12/12/2018  Weight (lbs) 197 lb 196 lb 12.8 oz 198 lb 14.4 oz  Weight (kg) 89.359 kg 89.268 kg 90.22 kg     Body mass index is 24.62 kg/m.  Physical exam deferred to MD, given level of patient anxiety. Please see above.  EKG:  The EKG was personally reviewed and demonstrates: Coarse Afib, IVCD, 103bpm Telemetry:  Telemetry was personally reviewed and demonstrates:  Atrial fibrillation with ventricular rates 70-100s  Relevant CV Studies:  Echo 09/14/18: IMPRESSIONS 1. The left ventricle has normal systolic function, with an ejection fraction of 55%. The cavity size was normal. There is severe concentric left ventricular hypertrophy. Left ventricular diastolic function could not be evaluated due to nondiagnostic  images. No evidence of left ventricular regional wall motion abnormalities. 2. The right ventricle has normal systolic function. The cavity was normal. There is no increase in right ventricular wall thickness. 3. - TAVR:  S/P 65mm Edwards Sapien 3 TAVR with normal functioning prosthesis. There is no perivalvular AI. AV Area (VTI):  1.60 cm. AV Mean Grad: 19.0 . AV Vmax: 309.50  _____________  10/18/18 IMPRESSIONS 1. The left ventricle has normal systolic function, with an ejection fraction of 55-60%. The cavity size was normal. There is moderately increased left ventricular wall thickness. Left ventricular diastolic Doppler parameters are consistent with  pseudonormalization. Elevated mean left atrial pressure No evidence of left ventricular regional wall motion abnormalities. 2. The right ventricle has normal systolic function. The cavity was normal. There is no increase in right ventricular wall thickness. Right ventricular systolic pressure is normal with an estimated pressure of 20.1 mmHg. 3. Left atrial size was mildly dilated. 4. Right atrial size was mildly dilated. 5. A 26 Edwards Sapien bioprosthetic aortic valve (TAVR) valve is present in the aortic position. Procedure Date: 09/13/18 Normal aortic valve prosthesis. Echo findings shows no evidence of regurgitation or perivalvular leak of the aortic prosthesis. Mean gradient is mildly elevated but similar to prior echo from 09/14/2018.  Laboratory Data:  High Sensitivity Troponin:   Recent Labs  Lab 12/15/18 1628 12/15/18 1828 12/15/18 2135  TROPONINIHS 35* 35* 38*     Cardiac EnzymesNo results for input(s): TROPONINI in the last 168 hours. No results for input(s): TROPIPOC in the last 168 hours.  Chemistry Recent Labs  Lab 12/15/18 1628 12/16/18 0435  NA 139 139  K 4.3 3.8  CL 102 109  CO2 26 22  GLUCOSE 108* 96  BUN 29* 22  CREATININE 0.97 0.76  CALCIUM 9.6 8.6*  GFRNONAA >60 >60  GFRAA >60 >60  ANIONGAP 11 8    No results for input(s): PROT, ALBUMIN, AST, ALT, ALKPHOS, BILITOT in the last 168 hours. Hematology Recent Labs  Lab 12/15/18 1628 12/16/18 0435  WBC 5.6 4.7  RBC 5.19 4.48  HGB 16.4 14.3  HCT 49.1 42.1  MCV 94.6 94.0  MCH 31.6 31.9  MCHC 33.4 34.0  RDW 13.1 13.0  PLT 178 147*   BNPNo results for input(s): BNP,  PROBNP in the last 168 hours.  DDimer No results for input(s): DDIMER in the last 168 hours.   Radiology/Studies:  Dg Chest 2 View  Result Date: 12/15/2018 CLINICAL DATA:  Tachycardia atrial flutter EXAM: CHEST - 2 VIEW COMPARISON:  09/13/2018 FINDINGS: Partially visualized hardware in the cervical spine. Hyperinflated lungs. Aortic valve prosthesis. No acute airspace disease or effusion. Stable borderline cardiomegaly. No pneumothorax. IMPRESSION: No active cardiopulmonary disease.  Borderline cardiomegaly. Electronically Signed   By: Donavan Foil M.D.   On: 12/15/2018 17:24    Assessment and Plan:   Atrial fibrillation with RVR --Suspected onset during cardiac rehab on 9/3 as above in HPI. S/p TAVR 09/13/2018. Plavix recently stopped d/t bleeding. Remained on ASA until earlier this AM. EF normal as above. EKG as above showing new Afib, IVCD. --TSH 09/2016 2.38. Will recheck. Electrolytes stable with K 3.8 (goal 4.0) and Mg 2.1 (goal 2.0). Renal function stable. --Started on IV Cardizem in the ED, now adjusted to PRN Cardizem. Continue rate control as needed with current PRN Cardizem as needed for goal ventricular rate less than 110bpm. Per MD, plan to start oral Cardizem 180mg  daily by discharge.  --CHA2DS2VASc score of at least 5 (Pulmonary HTN, agex2, vascular) with recommendation for [place] long term. Recently stopped Plavix d/t bleeding as above. ASA discontinued earlier this AM. Continue Eliquis 5mg  BID, started today, at discharge. Patient does not meet criteria for reduced  dosing of Eliquis. Given not previously on [facility], would need TEE prior to DCCV to r/o atrial thrombus. No current plan for TEE/DCCV per MD and will continue to monitor as currently rate controlled. If remains in Afib, will need at least 1 month [place] before DCCV as an outpatient. If remains in Afib at discharge, consider Zio at discharge to assess burden as well.    Elevated troponin --Minimally elevated HS Tn, flat trending, not  consistent with ACS. No plan for ischemic evaluation with recent cath as above in HPI. --Likely supply demand ischemia in setting of rapid ventricular rate.  HTN --Continue medical management.  HLD --Continue statin.   For questions or updates, please contact Home Please consult [email or web address] for contact info under     Signed, Arvil Chaco, PA-C  12/16/2018 12:01 PM

## 2018-12-16 NOTE — Progress Notes (Signed)
Went over discharge instructions with the patient including medications and follow-up appointment. Darlene RN discontinue PIV and telemetry monitor. Mykia NT walked patient for discharge.

## 2018-12-16 NOTE — Telephone Encounter (Signed)
Left voicemail message to call back  

## 2018-12-16 NOTE — Plan of Care (Signed)
  Problem: Education: Goal: Knowledge of General Education information will improve Description: Including pain rating scale, medication(s)/side effects and non-pharmacologic comfort measures Outcome: Progressing   Problem: Clinical Measurements: Goal: Cardiovascular complication will be avoided Outcome: Progressing   Problem: Education: Goal: Knowledge of disease or condition will improve Outcome: Progressing   Problem: Cardiac: Goal: Ability to achieve and maintain adequate cardiopulmonary perfusion will improve Outcome: Progressing

## 2018-12-16 NOTE — Plan of Care (Signed)

## 2018-12-16 NOTE — Telephone Encounter (Signed)
Pt c/o medication issue:  1. Name of Medication: cardizem   2. How are you currently taking this medication (dosage and times per day)? 120-180 mg   3. Are you having a reaction (difficulty breathing--STAT)?  No   4. What is your medication issue?  Patient calling from pharmacy to clarify dose .

## 2018-12-16 NOTE — Clinical Social Work Note (Signed)
CSW was informed that patient needed an Eliquis coupon, CSW provided a 1 month free coupon for patient to use.  CSW signing off, patient did not express any other needs or concerns for social work.  Jones Broom. Norval Morton, MSW, [place]  12/16/2018 5:46 PM

## 2018-12-16 NOTE — Discharge Summary (Signed)
James Cardenas, is a 78 y.o. male  DOB 1940-09-20  MRN [id number].  Admission date:  12/15/2018  Admitting Physician  Jude Stark Jock, MD  Discharge Date:  12/16/2018   Primary MD  Olin Hauser, DO  Recommendations for primary care physician for things to follow:   Follow with PCP in 1 week Follow-up with Dr. Dr. Rockey Situ in 1 week admission Diagnosis  New onset atrial fibrillation ([place]) [I48.91] Atrial fibrillation with RVR ([place]) [I48.91]   Discharge Diagnosis  New onset atrial fibrillation ([place]) [I48.91] Atrial fibrillation with RVR ([place]) [I48.91]    Active Problems:   Atrial fibrillation with rapid ventricular response ([place])      Past Medical History:  Diagnosis Date  . Anxiety   . Arthritis of knee   . Carotid artery disease ([place])   . 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 Coronary artery disease   . Hearing loss    wears hearing aids  . Hypertension   . Neuropathy   . S/P TAVR (transcatheter aortic valve replacement)    26 mm Edwards Sapien THV via the TF approach   . Severe aortic stenosis     Past Surgical History:  Procedure Laterality Date  . COLONOSCOPY    . EYE SURGERY Bilateral    cataract  . HERNIA REPAIR Left    Inguinal  . NECK SURGERY  2011   Cervical Fusion  . RIGHT HEART CATH AND CORONARY ANGIOGRAPHY N/A 07/28/2018   Procedure: RIGHT HEART CATH AND CORONARY ANGIOGRAPHY;  Surgeon: Minna Merritts, MD;  Location: [facility];  Service: Cardiovascular;  Laterality: N/A;  . TEE WITHOUT CARDIOVERSION N/A 09/13/2018   Procedure: TRANSESOPHAGEAL ECHOCARDIOGRAM (TEE);  Surgeon: Sherren Mocha, MD;  Location: [facility];  Service: Open Heart Surgery;  Laterality: N/A;  . TRANSCATHETER AORTIC VALVE REPLACEMENT, TRANSFEMORAL  09/13/2018  . TRANSCATHETER AORTIC VALVE REPLACEMENT,  TRANSFEMORAL N/A 09/13/2018   Procedure: TRANSCATHETER AORTIC VALVE REPLACEMENT, TRANSFEMORAL;  Surgeon: Sherren Mocha, MD;  Location: [facility];  Service: Open Heart Surgery;  Laterality: N/A;       History of present illness and  Hospital Course:     Kindly see H&P for history of present illness and admission details, please review complete Labs, Consult reports and Test reports for all details in brief  HPI  from the history and physical done on the day of admission 78 year old gentleman with history of severe aortic valve stenosis status post TAVR, anxiety came to hospital from cardiac rehab because of tachycardia, patient found to have atrial fibrillation/flutter and admitted for rate control.   Hospital Course   #1 atrial flutter/fibrillation, patient rate rapid ventricular rate on admission, admitted to telemetry, started on Cardizem 30 mg every 6 hours, patient heart rate improved to 90s to 100s, patient is seen by Dr. Rockey Situ, recommends to start Cardizem CD at 180 mg daily, discontinue irbesartan at discharge, patient started on Eliquis 5 mg p.o. daily, patient is advised to stop the aspirin, irbesartan, take Cardizem CD 180 mg from tomorrow and also Eliquis 5 mg p.o. twice daily and see Dr. Rockey Situ in 1 week.  Discussed risks and benefits of anticoagulation by Dr. Rockey Situ.  Patient chose to start Eliquis, we gave Eliquis 5 mg p.o. twice daily.  2.  Elevated troponins: Not needing any further ischemia work-up. 3.  Anxiety, this is the main problem, patient is known to Dr. Sheron Nightingale practice for a long time, patient has severe anxiety and takes  Xanax for that.    Discharge Condition: Stable   Follow UP      Discharge Instructions  and  Discharge Medications      Allergies as of 12/16/2018      Reactions   Nickel Itching      Medication List    STOP taking these medications   aspirin EC 81 MG tablet   telmisartan 80 MG tablet Commonly known as: MICARDIS     TAKE these  medications   Alpha-Lipoic Acid 600 MG Caps Take 600 mg by mouth every morning.   ALPRAZolam 0.5 MG tablet Commonly known as: XANAX Take 1 tablet (0.5 mg total) by mouth 2 (two) times daily as needed for anxiety.   amoxicillin 500 MG tablet Commonly known as: AMOXIL Take 2,000 mg (4 pills) one hour prior to all dental visits. There are enough for two visits in this bottle.   apixaban 5 MG Tabs tablet Commonly known as: ELIQUIS Take 1 tablet (5 mg total) by mouth 2 (two) times daily.   Azelaic Acid 15 % cream Apply 1 application topically daily. After skin is thoroughly washed and patted dry, gently but thoroughly massage a thin film of azelaic acid cream into the affected area once daily   cholecalciferol 25 MCG (1000 UT) tablet Commonly known as: VITAMIN D3 Take 1,000 Units by mouth daily.   diltiazem 180 MG 24 hr capsule Commonly known as: Cardizem CD Take 1 capsule (180 mg total) by mouth daily.   LUBRICATING EYE DROPS OP Place 1 drop into both eyes 2 (two) times a day.   mirabegron ER 50 MG Tb24 tablet Commonly known as: MYRBETRIQ Take 1 tablet (50 mg total) by mouth daily.   multivitamin with minerals Tabs tablet Take 1 tablet by mouth daily at 3 pm. Centrum   OVER THE COUNTER MEDICATION Take 1 tablet by mouth at bedtime as needed (sleep). GNC PREVENTIVE NUTRITION TRI-SLEEP   polyethylene glycol 17 g packet Commonly known as: MIRALAX / GLYCOLAX Take 17 g by mouth 2 (two) times daily as needed (constipation).   rosuvastatin 10 MG tablet Commonly known as: CRESTOR Take 1 tablet (10 mg total) by mouth daily.   sildenafil 20 MG tablet Commonly known as: REVATIO Take up to 5 pills about 30 min prior to sex   tretinoin 0.1 % cream Commonly known as: RETIN-A Apply 1 application topically at bedtime.         Diet and Activity recommendation: See Discharge Instructions above   Consults obtained -cardiology   Major procedures and Radiology Reports -  PLEASE review detailed and final reports for all details, in brief -      Dg Chest 2 View  Result Date: 12/15/2018 CLINICAL DATA:  Tachycardia atrial flutter EXAM: CHEST - 2 VIEW COMPARISON:  09/13/2018 FINDINGS: Partially visualized hardware in the cervical spine. Hyperinflated lungs. Aortic valve prosthesis. No acute airspace disease or effusion. Stable borderline cardiomegaly. No pneumothorax. IMPRESSION: No active cardiopulmonary disease.  Borderline cardiomegaly. Electronically Signed   By: Donavan Foil M.D.   On: 12/15/2018 17:24    Micro Results     Recent Results (from the past 240 hour(s))  SARS Coronavirus 2 [facility] order, Performed in [facility] hospital lab) Nasopharyngeal Nasopharyngeal Swab     Status: None   Collection Time: 12/15/18  6:28 PM   Specimen: Nasopharyngeal Swab  Result Value Ref Range Status   SARS Coronavirus 2 NEGATIVE NEGATIVE Final    Comment: (NOTE) If result is NEGATIVE SARS-CoV-2  target nucleic acids are NOT DETECTED. The SARS-CoV-2 RNA is generally detectable in upper and lower  respiratory specimens during the acute phase of infection. The lowest  concentration of SARS-CoV-2 viral copies this assay can detect is 250  copies / mL. A negative result does not preclude SARS-CoV-2 infection  and should not be used as the sole basis for treatment or other  patient management decisions.  A negative result may occur with  improper specimen collection / handling, submission of specimen other  than nasopharyngeal swab, presence of viral mutation(s) within the  areas targeted by this assay, and inadequate number of viral copies  (<250 copies / mL). A negative result must be combined with clinical  observations, patient history, and epidemiological information. If result is POSITIVE SARS-CoV-2 target nucleic acids are DETECTED. The SARS-CoV-2 RNA is generally detectable in upper and lower  respiratory specimens dur ing the acute phase of infection.   Positive  results are indicative of active infection with SARS-CoV-2.  Clinical  correlation with patient history and other diagnostic information is  necessary to determine patient infection status.  Positive results do  not rule out bacterial infection or co-infection with other viruses. If result is PRESUMPTIVE POSTIVE SARS-CoV-2 nucleic acids MAY BE PRESENT.   A presumptive positive result was obtained on the submitted specimen  and confirmed on repeat testing.  While 2019 novel coronavirus  (SARS-CoV-2) nucleic acids may be present in the submitted sample  additional confirmatory testing may be necessary for epidemiological  and / or clinical management purposes  to differentiate between  SARS-CoV-2 and other Sarbecovirus currently known to infect humans.  If clinically indicated additional testing with an alternate test  methodology [phone]) is advised. The SARS-CoV-2 RNA is generally  detectable in upper and lower respiratory sp ecimens during the acute  phase of infection. The expected result is Negative. Fact Sheet for Patients:  StrictlyIdeas.no Fact Sheet for Healthcare Providers: BankingDealers.co.za This test is not yet approved or cleared by the [place] FDA and has been authorized for detection and/or diagnosis of SARS-CoV-2 by FDA under an Emergency Use Authorization (EUA).  This EUA will remain in effect (meaning this test can be used) for the duration of the COVID-19 declaration under Section 564(b)(1) of the Act, 21 U.S.C. section 360bbb-3(b)(1), unless the authorization is terminated or revoked sooner. Performed at [facility], [address] [place], [place] [address]        Today   Subjective:   Machi Vanpatten today has no headache,no chest abdominal pain,no new weakness tingling or numbness, feels much better wants to go home today.   Objective:   Blood pressure (!) 128/98, pulse 85,  temperature 97.7 F (36.5 C), temperature source Oral, resp. rate 19, height 6\' 3"  (1.905 m), weight 89.4 kg, SpO2 100 %.   Intake/Output Summary (Last 24 hours) at 12/16/2018 1400 Last data filed at 12/16/2018 0341 Gross per 24 hour  Intake -  Output 0 ml  Net 0 ml    Exam Awake Alert, Oriented x 3, No new F.N deficits, Normal affect [place].AT,PERRAL Supple Neck,No JVD, No cervical lymphadenopathy appriciated.  Symmetrical Chest wall movement, Good air movement bilaterally, CTAB RRR,No Gallops,Rubs or new Murmurs, No Parasternal Heave +ve B.Sounds, Abd Soft, Non tender, No organomegaly appriciated, No rebound -guarding or rigidity. No Cyanosis, Clubbing or edema, No new Rash or bruise  Data Review   CBC w Diff:  Lab Results  Component Value Date   WBC 4.7 12/16/2018  HGB 14.3 12/16/2018   HCT 42.1 12/16/2018   PLT 147 (L) 12/16/2018   LYMPHOPCT 17 07/25/2018   MONOPCT 10 07/25/2018   EOSPCT 4 07/25/2018   BASOPCT 1 07/25/2018    CMP:  Lab Results  Component Value Date   NA 139 12/16/2018   NA 139 11/09/2015   K 3.8 12/16/2018   K 4.5 11/09/2015   CL 109 12/16/2018   CL 101 11/09/2015   CO2 22 12/16/2018   CO2 25 11/09/2015   BUN 22 12/16/2018   BUN 20 11/09/2015   CREATININE 0.76 12/16/2018   CREATININE 0.89 11/09/2015   PROT 6.8 09/09/2018   ALBUMIN 3.9 09/09/2018   BILITOT 1.6 (H) 09/09/2018   ALKPHOS 72 09/09/2018   AST 37 09/09/2018   ALT 39 09/09/2018  .   Total Time in preparing paper work, data evaluation and todays exam - 12 minutes  Epifanio Lesches M.D on 12/16/2018 at 2:00 PM    Note: This dictation was prepared with Dragon dictation along with smaller phrase technology. Any transcriptional errors that result from this process are unintentional.

## 2018-12-20 ENCOUNTER — Telehealth: Payer: Self-pay | Admitting: Cardiovascular Disease

## 2018-12-20 NOTE — Telephone Encounter (Signed)
Error

## 2018-12-20 NOTE — Telephone Encounter (Signed)
Patient returning phone call from Friday. Patient picked up Rx for 180 mg of diltiazem, however was having low BP so he did not take that 180 mg rx. BP average is 113/70 from 33 readings patient has taken while off diltiazem  Patient has started eliquis on Friday. Since then patients HR has been consistent between 61-98 with an average of 76. Patient wants to know, would it be easier to get off eliquis and watch readings without it. Patient states he has anxiety and might play in part with this.   Will need a long time, has told patient about fu appointment.

## 2018-12-21 NOTE — Telephone Encounter (Signed)
Spoke with patient for extended period. He was at cardiac rehab and ended up going to ED. Never previously had issue with heart rate. He feels that this was due to the walk into the hospital and then going directly onto machine for his activity. He states that once they noticed it and called his anxiety went through the roof and he feels that is what made it worse. He reviewed that on his release Friday when he got home his blood pressure was 85/43 with heart rate of 101. He did not take the diltiazem due to those findings and he noticed it can be used for blood pressure. He strongly feels that his anxiety caused much of that elevated heart rates and then reviewed his average ranges.  35 readings over 4 days Ranges  Systolic [id number] Diastolic [id number] Heart rate 61-98 with a average 60-80's and only one 98 heart rate and 113/59 was working in the house.    He has also been having some dizzy spells with no activity and no other factors. He does want to know if the Eliquis could be causing these dizzy spells. Also reports of fatigue and headaches. He could not exclude hydration issues and reviewed the importance of taking the Eliquis for stroke prevention. He states that after 3 months from TAVR he was having some issues with bleeding and stopped clopidogrel so he did want to make sure this was not some side effects from this medication. Advised that I would send this information over to Dr. Rockey Situ for his review and that it would be fine to hold the diltiazem but to please continue the Eliquis until I get further advice from Dr. Rockey Situ. He was agreeable with this plan and had no further questions at this time.

## 2018-12-21 NOTE — Telephone Encounter (Signed)
Patient is returning your call.  

## 2018-12-22 NOTE — Telephone Encounter (Signed)
Patient calling in with two more pieces of information -pt have had other medications that gives him dizzy spells (not heart medications but others) -if pt takes new bp med and starts exercise maybe it wont decrease bp  Patient wanted to give these statements for nurses conversation with provider.   Please advise when able

## 2018-12-22 NOTE — Telephone Encounter (Signed)
Spoke with patient and he states that his anxiety has been very high. Reviewed in detail his concerns and he is going to try the Diltiazem and wanted to just review medications. Discussed that if he takes the diltiazem and he develops problems with low heart rates or low blood pressures then he can hold the medication. Reviewed that should be fine and to call back if he should have any concerns. Advised that he under no circumstances should he hold or stop Eliquis and reviewed risks associated with that. So he was feeling much better anxiety wise knowing that he can hold or stop Diltiazem if needed and verbalized understanding that he should remain on Eliquis and to not stop or hold that. Confirmed his upcoming appointments and reviewed importance of keeping those to discuss with providers in person. He admits to high anxiety and just needed reassurance. Advised that I totally understand and to please let us know if any further questions.

## 2018-12-24 ENCOUNTER — Telehealth: Payer: Self-pay | Admitting: Internal Medicine

## 2018-12-24 NOTE — Telephone Encounter (Signed)
Patient called with question about discontinuing irbesartan given his low BPs (101/74). Patient instructed to discontinue irbesartan (as was previously recommended on discharge summary on 9/4). Patient relatively asymptomatic from a blood pressure standpoint, but he wanted to be sure that he was taking his medications appropriately. All questions were answered and patient was given anticipatory guidance and recommendations on when to seek higher level of care.

## 2018-12-28 ENCOUNTER — Telehealth: Payer: Self-pay | Admitting: Cardiovascular Disease

## 2018-12-28 NOTE — Telephone Encounter (Signed)
Please call to discuss medications. Diltiazem is causing his lower numbers of his BP to run between 80s, occasionally 90. States it used to be in the 60s.  HR is 115-120. He also asks if he can start exercising.

## 2018-12-29 NOTE — Telephone Encounter (Signed)
Spoke with patient and he states that his heart rate stays consistent 115-121. He wanted to confirm that the new diltiazem is controlling his heart rate well enough for him to start some level of exercise. Recommended that he increase slowly and listen to his own body with tolerating increased activity. He verbalized understanding with no further questions at this time.

## 2019-01-01 NOTE — Telephone Encounter (Signed)
He needs an appt sooner that October BP is low on diltiazem, Might need other med modification such as digoxin, Might need tee cardioversion sooner

## 2019-01-02 MED ORDER — DIGOXIN 250 MCG PO TABS: 0.2500 mg | ORAL_TABLET | Freq: Every day | ORAL | 3 refills | Status: DC

## 2019-01-02 NOTE — Telephone Encounter (Signed)
Reviewed the information below with Dr. Rockey Situ. Per MD- he would prefer the patient not go back on losartan due to the chance of his HR's going even higher.  Recommendations are to: 1) continue diltiazem 2) Start digoxin 0.25 mg once daily 3) follow up as planned on Thursday 9/24 with Ignacia Bayley, NP.  I have spoken with the patient and made him aware of MD recommendations.  He is agreeable with starting digoxin 0.25 mg once daily and reevaluating his BP/ HR and further plan of care at his office visit on 01/05/19.

## 2019-01-02 NOTE — Telephone Encounter (Signed)
Patient calling  Patient wants to discuss switching medication - wishes to go back to losartan if possible  Patient has scheduled to see Angelica Ran on 9/24 but would like a call to discuss before

## 2019-01-02 NOTE — Telephone Encounter (Signed)
I spoke with the patient. He called today stating that his BP (HR) had been running 100-125/50-70 (56-76 bpm) prior to his episode of atrial fib that occurred at Cardiac Rehab on 12/15/18. He was sent to the ER with A-fib w/ RVR. He states he was told to stop losartan and take diltiazem 180 mg once daily.   Since being discharged on 9/4 with the diltiazem, his BP (HR) has been 120-145/ 85-98 (118-121 bpm). The patient does have a follow up appointment with Ignacia Bayley, NP on 9/24.  He is calling today asking if he can please go back on his losartan 100 mg once daily and stop diltiazem until seen by Gerald Stabs on 9/24.   I have advised him that his losartan will not offer him any rate control, but at the same time, I am unsure if we would see much variation in HR with him off diltiazem.  He has been advised that even if he goes back on losartan he will most likely require DCCV and other medication changes as well.   The patient voices understanding of the above and is agreeable.  He is aware I will discuss with Dr. Rockey Situ and call him back.

## 2019-01-03 ENCOUNTER — Telehealth: Payer: Self-pay | Admitting: *Deleted

## 2019-01-03 ENCOUNTER — Telehealth: Payer: Self-pay | Admitting: Cardiovascular Disease

## 2019-01-03 NOTE — Telephone Encounter (Signed)
Pt c/o medication issue:  1. Name of Medication: digoxin    2. How are you currently taking this medication (dosage and times per day)?  0.25 mg  po q d   3. Are you having a reaction (difficulty breathing--STAT)? No   4. What is your medication issue? Patient was told by pharmacy that Digoxin interacts with Diltiazem .  Patient has not started taking yet until he hears back from [facility] if it is still ok to take.  Patient also wants to know what to do if he does have a reaction

## 2019-01-03 NOTE — Telephone Encounter (Signed)
Pt requiring PA for Digoxin 250 mcg. PA has been submitted through covermymeds. Awaiting approval.

## 2019-01-03 NOTE — Telephone Encounter (Signed)
Pt has been approved for Digoxin 250 mcg. This approval authorizes your coverage from 10/05/2018 - 01/03/2020

## 2019-01-05 ENCOUNTER — Other Ambulatory Visit: Payer: Self-pay

## 2019-01-05 ENCOUNTER — Ambulatory Visit (INDEPENDENT_AMBULATORY_CARE_PROVIDER_SITE_OTHER): Payer: Medicare Other | Admitting: Nurse Practitioner

## 2019-01-05 ENCOUNTER — Encounter: Payer: Self-pay | Admitting: Nurse Practitioner

## 2019-01-05 VITALS — BP 140/78 | HR 122 | Temp 97.5°F | Ht 75.0 in | Wt 195.0 lb

## 2019-01-05 DIAGNOSIS — Z23 Encounter for immunization: Secondary | ICD-10-CM | POA: Diagnosis not present

## 2019-01-05 DIAGNOSIS — I10 Essential (primary) hypertension: Secondary | ICD-10-CM | POA: Diagnosis not present

## 2019-01-05 DIAGNOSIS — I4892 Unspecified atrial flutter: Secondary | ICD-10-CM | POA: Diagnosis not present

## 2019-01-05 DIAGNOSIS — E782 Mixed hyperlipidemia: Secondary | ICD-10-CM

## 2019-01-05 DIAGNOSIS — Z952 Presence of prosthetic heart valve: Secondary | ICD-10-CM | POA: Diagnosis not present

## 2019-01-05 NOTE — Telephone Encounter (Signed)
Seen by chris berge today All set

## 2019-01-05 NOTE — Patient Instructions (Signed)
Medication Instructions:  Your physician recommends that you continue on your current medications as directed. Please refer to the Current Medication list given to you today. If you need a refill on your cardiac medications before your next appointment, please call your pharmacy.   Lab work: 1- Your physician recommends that you return for lab work in: 1 week at [facility]. (BMET, CBC, digoxin level) No appt is needed. Hours are M-F 7AM- 6 PM.  If you have labs (blood work) drawn today and your tests are completely normal, you will receive your results only by: Marland Kitchen MyChart Message (if you have MyChart) OR . A paper copy in the mail If you have any lab test that is abnormal or we need to change your treatment, we will call you to review the results.  2- CV19 Pre admit testing DRIVE THRU  Please report to the PAT testing site ([facility]) on ____10/6 Tuesday_____ date ______12:30-2:30PM_____ time for your DRIVE THRU covid testing that is required prior to your procedure.  Following covid testing, please remain in quarantine. If you must be around others, please wash hands, avoid touching face and wear your mask.   Testing/Procedures: 1- Cardioversion  You are scheduled for a Cardioversion on 10/9 with Dr. Rockey Situ.  Please arrive at the [place] [facility] at 06:30AM. (1 hour prior to procedure unless lab work is needed; if lab work is needed arrive 1.5 hours ahead)  DIET: Nothing to eat or drink after midnight except a sip of water with medications (see medication instructions below)  Medication Instructions:  Take Digoxin and diltiazem  Continue your anticoagulant: Eliquis  You will need to continue your anticoagulant after your procedure until you are told by your  Provider that it is safe to stop   You must have a responsible person to drive you home and stay in the waiting area during your procedure. Failure to do so could result in cancellation.  Bring your  insurance cards.  *Special Note: Every effort is made to have your procedure done on time. Occasionally there are emergencies that occur at the hospital that may cause delays. Please be patient if a delay does occur.     Follow-Up: At [facility], you and your health needs are our priority.  As part of our continuing mission to provide you with exceptional heart care, we have created designated Provider Care Teams.  These Care Teams include your primary Cardiologist (physician) and Advanced Practice Providers (APPs -  Physician Assistants and Nurse Practitioners) who all work together to provide you with the care you need, when you need it. You will need a follow up appointment in 3 weeks.  You may see Ida Rogue, MD or Murray Hodgkins, NP.

## 2019-01-05 NOTE — Progress Notes (Signed)
Cardiology Clinic Note   Patient Name: James Cardenas Date of Encounter: 01/05/2019  Primary Care Provider:  Olin Hauser, DO Primary Cardiologist:  Ida Rogue, MD  Patient Profile   78 year old male with a history of aortic stenosis status post TAVR in June 2020, nonobstructive CAD, hypertension, hyperlipidemia, anxiety, and mild carotid arterial disease, who presents for follow-up related to recent diagnosis of atrial flutter.  Past Medical History    Past Medical History:  Diagnosis Date   Anxiety    Arthritis of knee    Atypical atrial flutter ([place])    a. Dx 12/15/2018. CHA2DS2VASc 3-->eliquis.   Carotid artery disease (HCC)    Constipation    due to Myrbetriq-     Hearing loss    wears hearing aids   Hypertension    Neuropathy    Nonobstructive Coronary artery disease    a. 07/2018 Cath: LMN nl, LAD/LCX/RCA no significant dzs. EF 55-65%.   S/P TAVR (transcatheter aortic valve replacement)    a. 09/2018 TAVR: 26 mm Edwards Sapien THV via the TF approach; b. 10/2018 Echo: EF 55-60%. Nl RV fxn. RVSP 20.98mmHg. Mild BAE. No AI or perivalvular leak.   Severe aortic stenosis    Past Surgical History:  Procedure Laterality Date   COLONOSCOPY     EYE SURGERY Bilateral    cataract   HERNIA REPAIR Left    Inguinal   NECK SURGERY  2011   Cervical Fusion   RIGHT HEART CATH AND CORONARY ANGIOGRAPHY N/A 07/28/2018   Procedure: RIGHT HEART CATH AND CORONARY ANGIOGRAPHY;  Surgeon: Minna Merritts, MD;  Location: [facility];  Service: Cardiovascular;  Laterality: N/A;   TEE WITHOUT CARDIOVERSION N/A 09/13/2018   Procedure: TRANSESOPHAGEAL ECHOCARDIOGRAM (TEE);  Surgeon: Sherren Mocha, MD;  Location: [facility];  Service: Open Heart Surgery;  Laterality: N/A;   TRANSCATHETER AORTIC VALVE REPLACEMENT, TRANSFEMORAL  09/13/2018   TRANSCATHETER AORTIC VALVE REPLACEMENT, TRANSFEMORAL N/A 09/13/2018   Procedure: TRANSCATHETER AORTIC VALVE REPLACEMENT,  TRANSFEMORAL;  Surgeon: Sherren Mocha, MD;  Location: [facility];  Service: Open Heart Surgery;  Laterality: N/A;    Allergies  Allergies  Allergen Reactions   Nickel Itching    History of Present Illness    78 year old male with the above complex past medical history including severe aortic stenosis status post TAVR in June 2020, nonobstructive CAD, hypertension, hyperlipidemia, anxiety, and mild carotid arterial disease.  He had a known history of aortic stenosis which was followed over time and he was subsequently worked up for TAVR in the setting of worsening symptoms earlier this year.  Catheterization showed no significant disease.  Carotid ultrasound showed mild carotid disease.  Pre-TAVR chest CT showed small pulmonary nodules with recommendation for follow-up in 12 months (no smoking history).  TAVR was successfully performed in June and he noted significant improvement in exercise tolerance.  He is quite proud of the fact that he joined the accelerated track of cardiac rehab and on September 3, he presented for his last day.  He was noted to be tachycardic prior to beginning and remained tachycardic throughout rehab.  He was asymptomatic.  Because of ongoing tachycardia, he was sent to the emergency department for further evaluation.  There, he was found to be in atrial flutter.  He was placed on diltiazem with improved rates and seen by Dr. Rockey Situ.  Recommendation was made to add Eliquis and long-acting diltiazem and he was subsequently discharged with reasonable rate control.    He says  that following discharge, he never took the diltiazem and continued on his home dose of losartan.  He has extensive records with him today and shows me that his heart rates and blood pressure were well controlled during that period of time though heart rate started to increase and so he decided to take diltiazem.  This resulted in hypotension and dizziness.  After talking to somebody through our office, he  stopped losartan has since been taking diltiazem.  Since then, heart rates have been more or less "dialed in" at 117-118 and he has noted a slight increase in his diastolic blood pressure into the 80s to mid 80s and occasionally in the 90s.  Systolics have been stable.  He called the office to notify us of this and because of elevated heart rates, he was placed on digoxin.  He just started taking this 3 days ago.  Every once in a while when he checks his heart rate using his pulse oximeter, he sees a heart rate recorded in the 60s or 70s though for the most part, he is remained in the 1 teens.  He does have some fatigue but denies dyspnea on exertion, chest pain, palpitations, PND, orthopnea, dizziness, syncope, edema, or early satiety.  Home Medications    Prior to Admission medications   Medication Sig Start Date End Date Taking? Authorizing Provider  Alpha-Lipoic Acid 600 MG CAPS Take 600 mg by mouth every morning.    Yes [provider]  ALPRAZolam (XANAX) 0.5 MG tablet Take 1 tablet (0.5 mg total) by mouth 2 (two) times daily as needed for anxiety. 08/16/18  Yes Karamalegos, Devonne Doughty, DO  amoxicillin (AMOXIL) 500 MG tablet Take 2,000 mg (4 pills) one hour prior to all dental visits. There are enough for two visits in this bottle. 09/21/18  Yes Eileen Stanford, PA-C  apixaban (ELIQUIS) 5 MG TABS tablet Take 1 tablet (5 mg total) by mouth 2 (two) times daily. 12/16/18  Yes Epifanio Lesches, MD  Azelaic Acid 15 % cream Apply 1 application topically daily. After skin is thoroughly washed and patted dry, gently but thoroughly massage a thin film of azelaic acid cream into the affected area once daily   Yes [provider]  Carboxymethylcellul-Glycerin (LUBRICATING EYE DROPS OP) Place 1 drop into both eyes 2 (two) times a day.    Yes [provider]  cholecalciferol (VITAMIN D3) 25 MCG (1000 UT) tablet Take 1,000 Units by mouth daily.   Yes [provider]    digoxin (LANOXIN) 0.25 MG tablet Take 1 tablet (0.25 mg total) by mouth daily. 01/02/19  Yes Minna Merritts, MD  diltiazem (CARDIZEM CD) 180 MG 24 hr capsule Take 1 capsule (180 mg total) by mouth daily. 12/16/18 03/16/19 Yes Epifanio Lesches, MD  mirabegron ER (MYRBETRIQ) 50 MG TB24 tablet Take 1 tablet (50 mg total) by mouth daily. 11/15/18  Yes Stoioff, Ronda Fairly, MD  Multiple Vitamin (MULTIVITAMIN WITH MINERALS) TABS tablet Take 1 tablet by mouth daily at 3 pm. Centrum   Yes [provider]  OVER THE COUNTER MEDICATION Take 1 tablet by mouth at bedtime as needed (sleep). [place] PREVENTIVE NUTRITION TRI-SLEEP   Yes [provider]  polyethylene glycol (MIRALAX / GLYCOLAX) packet Take 17 g by mouth 2 (two) times daily as needed (constipation).    Yes [provider]  rosuvastatin (CRESTOR) 10 MG tablet Take 1 tablet (10 mg total) by mouth daily. 10/20/18 10/15/19 Yes Eileen Stanford, PA-C  sildenafil (REVATIO)  20 MG tablet Take up to 5 pills about 30 min prior to sex 10/12/17  Yes Karamalegos, Devonne Doughty, DO  tretinoin (RETIN-A) 0.1 % cream Apply 1 application topically at bedtime.  07/10/16  Yes [provider]    Family History    Family History  Problem Relation Age of Onset   Thyroid disease Mother    Lung cancer Father    Pancreatic cancer Brother    Alzheimer's disease Sister    Prostate cancer Neg Hx    Bladder Cancer Neg Hx    Kidney cancer Neg Hx    He indicated that his mother is deceased. He indicated that his father is deceased. He indicated that his sister is alive. He indicated that his brother is deceased. He indicated that the status of his neg hx is unknown.  Social History    Social History   Socioeconomic History   Marital status: Divorced    Spouse name: Not on file   Number of children: Not on file   Years of education: Not on file   Highest education level: Bachelor's degree (e.g., BA, AB, BS)  Occupational History    Occupation: Retired Designer, multimedia, Public affairs consultant / heat treating)  Social Designer, fashion/clothing strain: Not hard at all   Food insecurity    Worry: Never true    Inability: Never true   Transportation needs    Medical: No    Non-medical: No  Tobacco Use   Smoking status: Never Smoker   Smokeless tobacco: Never Used  Substance and Sexual Activity   Alcohol use: Yes    Alcohol/week: 7.0 standard drinks    Types: 1 Cans of beer, 6 Glasses of wine per week   Drug use: No   Sexual activity: Yes  Lifestyle   Physical activity    Days per week: 3 days    Minutes per session: 30 min   Stress: Not at all  Relationships   Social connections    Talks on phone: More than three times a week    Gets together: More than three times a week    Attends religious service: More than 4 times per year    Active member of club or organization: No    Attends meetings of clubs or organizations: Never    Relationship status: Divorced   Intimate partner violence    Fear of current or ex partner: No    Emotionally abused: No    Physically abused: No    Forced sexual activity: No  Other Topics Concern   Not on file  Social History Narrative   Not on file     Review of Systems    General:  +++ fatigue.  No chills, fever, night sweats or weight changes.  Cardiovascular:  No chest pain, dyspnea on exertion, edema, orthopnea, palpitations, paroxysmal nocturnal dyspnea. Dermatological: No rash, lesions/masses Respiratory: No cough, dyspnea Urologic: No hematuria, dysuria Abdominal:   No nausea, vomiting, diarrhea, bright red blood per rectum, melena, or hematemesis Neurologic:  No visual changes, wkns, changes in mental status. All other systems reviewed and are otherwise negative except as noted above.  Physical Exam    VS:  BP 140/78 (BP Location: Left Arm, Patient Position: Sitting, Cuff Size: Normal)    Pulse (!) 122    Temp (!) 97.5 F (36.4 C)    Ht 6\' 3"  (1.905 m)     Wt 195 lb (88.5 kg)    SpO2 98%  BMI 24.37 kg/m  , BMI Body mass index is 24.37 kg/m. GEN: Well nourished, well developed, in no acute distress. HEENT: normal. Neck: Supple, no JVD, carotid bruits, or masses. Cardiac: RRR, tachy, 2/6 syst murmur @ rusb, no rubs, or gallops. No clubbing, cyanosis, trace bilateral ankle edema.  Radials/PT 1+ and equal bilaterally.  Respiratory:  Respirations regular and unlabored, clear to auscultation bilaterally. GI: Soft, nontender, nondistended, BS + x 4. MS: no deformity or atrophy. Skin: warm and dry, no rash. Neuro:  Strength and sensation are intact. Psych: Normal affect.  Accessory Clinical Findings    ECG personally reviewed by me today-atrial flutter, 122, PVC, right bundle branch block- No acute changes  Lab Results  Component Value Date   WBC 4.7 12/16/2018   HGB 14.3 12/16/2018   HCT 42.1 12/16/2018   MCV 94.0 12/16/2018   PLT 147 (L) 12/16/2018   Lab Results  Component Value Date   CREATININE 0.76 12/16/2018   BUN 22 12/16/2018   NA 139 12/16/2018   K 3.8 12/16/2018   CL 109 12/16/2018   CO2 22 12/16/2018   Lab Results  Component Value Date   ALT 39 09/09/2018   AST 37 09/09/2018   ALKPHOS 72 09/09/2018   BILITOT 1.6 (H) 09/09/2018   Lab Results  Component Value Date   CHOL 146 11/24/2018   HDL 71 11/24/2018   LDLCALC 61 11/24/2018   TRIG 48 11/24/2018   CHOLHDL 2.1 11/24/2018     Assessment & Plan   1.  Atrial flutter with rapid ventricular response: This was diagnosed on September 3 and he has been anticoagulated and compliant with Eliquis since then.  He did not initially take diltiazem until he started noticing elevated heart rates and is now on both diltiazem and digoxin.  I spent an hour with Mr. Conser today looking over his blood pressure and heart rate records and discussing his diagnosis of atrial flutter with him.  Fortunately, he is not particularly symptomatic with the exception of some fatigue.  On  examination, he has mild ankle edema and I have encouraged him to keep his legs elevated when sitting.  Prefer to avoid diuretic therapy given a history of BPH for which she is on medicine.  I will plan to follow-up a CBC, basic metabolic panel, and digoxin level next week and he is scheduled for a cardioversion with Dr. Rockey Situ on October 9.  He will continue to follow his heart rates at home and if he notes significant improvement, I have asked him to call us so that we can bring him in for an ECG.  Likewise, if he has any worsening of symptoms prior to scheduled cardioversion, we can potentially move this up though if it occurs prior to next Thursday, he will require a TEE as well.  He is aware that if cardioversion is unsuccessful, we may need to consider antiarrhythmic therapy and/or EP referral in the future.  2.  History of aortic stenosis status post TAVR: Echo in July showed normal device function without any significant aortic insufficiency or perivalvular leak.  3.  Essential hypertension: Blood pressure elevated today though pressures have been stable at home.  Continue current dose of diltiazem therapy.  He will stay off of losartan as he has significant hypotension on the combination of the 2.  4.  Hyperlipidemia: He remains on statin therapy with an LDL of 61 in August.  LFTs were normal earlier this year.  5.  Mild carotid arterial  disease: Continue statin.  No aspirin as he is now on Eliquis.  6.  Pulmonary nodules: Noted on CT of the chest earlier this year.  Will need a follow-up CT in May of next year to assess for stability.  7.  Disposition: Follow-up labs in 1 week with plan for cardioversion in 2 weeks.  Murray Hodgkins, NP 01/05/2019, 6:21 PM

## 2019-01-05 NOTE — Telephone Encounter (Signed)
Patient informed and will discuss today at his office visit.

## 2019-01-05 NOTE — Telephone Encounter (Signed)
Digoxin and diltiazem are both designed to lower heart rate.  They are fine to use together in order to prevent further spikes in heart rate.  Patient should be fine with both medications, just monitor heart rate

## 2019-01-09 ENCOUNTER — Other Ambulatory Visit: Payer: Self-pay

## 2019-01-09 ENCOUNTER — Encounter: Payer: Self-pay | Admitting: Podiatry

## 2019-01-09 ENCOUNTER — Ambulatory Visit (INDEPENDENT_AMBULATORY_CARE_PROVIDER_SITE_OTHER): Payer: Medicare Other | Admitting: Podiatry

## 2019-01-09 DIAGNOSIS — M79676 Pain in unspecified toe(s): Secondary | ICD-10-CM | POA: Diagnosis not present

## 2019-01-09 DIAGNOSIS — B351 Tinea unguium: Secondary | ICD-10-CM

## 2019-01-09 DIAGNOSIS — M2042 Other hammer toe(s) (acquired), left foot: Secondary | ICD-10-CM

## 2019-01-09 DIAGNOSIS — M2041 Other hammer toe(s) (acquired), right foot: Secondary | ICD-10-CM

## 2019-01-09 NOTE — Progress Notes (Signed)
Complaint:  Visit Type: Patient returns to my office for continued preventative foot care services. Complaint: Patient states" my nails have grown long and thick and become painful to walk and wear shoes.   The patient presents for preventative foot care services. No changes to ROS  Podiatric Exam: Vascular: dorsalis pedis and posterior tibial pulses are palpable bilateral. Capillary return is immediate. Temperature gradient is WNL. Skin turgor WNL  Sensorium: Normal Semmes Weinstein monofilament test. Normal tactile sensation bilaterally. Nail Exam: Pt has thick disfigured discolored nails with subungual debris noted bilateral entire nail hallux through fifth toenails Ulcer Exam: There is no evidence of ulcer or pre-ulcerative changes or infection. Orthopedic Exam: Muscle tone and strength are WNL. No limitations in general ROM. No crepitus or effusions noted. Foot type and digits show no abnormalities. Hallux malleus  B/L  Hammer toes  B/l. Skin: No Porokeratosis. No infection or ulcers.    Diagnosis:  Onychomycosis, , Pain in right toe, pain in left toes  Treatment & Plan Procedures and Treatment: Consent by patient was obtained for treatment procedures.   Debridement of mycotic and hypertrophic toenails, 1 through 5 bilateral and clearing of subungual debris. No ulceration, no infection noted.   Return Visit-Office Procedure: Patient instructed to return to the office for a follow up visit 10 weeks  for continued evaluation and treatment.    Evalene Vath DPM 

## 2019-01-11 ENCOUNTER — Other Ambulatory Visit
Admission: RE | Admit: 2019-01-11 | Discharge: 2019-01-11 | Disposition: A | Discharge status: Home / Self Care | Payer: Medicare Other | Source: Ambulatory Visit | Attending: Nurse Practitioner | Admitting: Nurse Practitioner

## 2019-01-11 DIAGNOSIS — I10 Essential (primary) hypertension: Secondary | ICD-10-CM | POA: Diagnosis not present

## 2019-01-11 LAB — CBC
HCT: 42.5 % (ref 39.0–52.0)
Hemoglobin: 14.3 g/dL (ref 13.0–17.0)
MCH: 31.3 pg (ref 26.0–34.0)
MCHC: 33.6 g/dL (ref 30.0–36.0)
MCV: 93 fL (ref 80.0–100.0)
Platelets: 158 10*3/uL (ref 150–400)
RBC: 4.57 MIL/uL (ref 4.22–5.81)
RDW: 12.8 % (ref 11.5–15.5)
WBC: 4.6 10*3/uL (ref 4.0–10.5)
nRBC: 0 % (ref 0.0–0.2)

## 2019-01-11 LAB — BASIC METABOLIC PANEL
Anion gap: 9 (ref 5–15)
BUN: 27 mg/dL — ABNORMAL HIGH (ref 8–23)
CO2: 24 mmol/L (ref 22–32)
Calcium: 9.3 mg/dL (ref 8.9–10.3)
Chloride: 105 mmol/L (ref 98–111)
Creatinine, Ser: 0.93 mg/dL (ref 0.61–1.24)
GFR calc Af Amer: >60 mL/min (ref >60)
GFR calc non Af Amer: >60 mL/min (ref >60)
Glucose, Bld: 116 mg/dL — ABNORMAL HIGH (ref 70–99)
Potassium: 4 mmol/L (ref 3.5–5.1)
Sodium: 138 mmol/L (ref 135–145)

## 2019-01-12 ENCOUNTER — Telehealth: Payer: Self-pay | Admitting: Cardiovascular Disease

## 2019-01-12 DIAGNOSIS — I4892 Unspecified atrial flutter: Secondary | ICD-10-CM

## 2019-01-12 DIAGNOSIS — R42 Dizziness and giddiness: Secondary | ICD-10-CM

## 2019-01-12 NOTE — Telephone Encounter (Signed)
Call to patient, he reports that he feels that HR is back in normal range.   57- low 80s. "As normal as ive seen in 30 years".   Had "experience" while laying in bed after taking anxiety pill. Had dizzy spell and went off to sleep. This morning when he woke and took vitals, he was surpirsed to find that HR was in normal range. He watched throughout the day and took with several different machine.   Pt inquiring if cardioversion is still necessary. Blood work taken yesterday.

## 2019-01-12 NOTE — Telephone Encounter (Signed)
Patient calling to let us know he has seen improvement in HR today   Resting HR 57-65 and exertion 60-80's   Patient has not had these readings since prior to cardiac episodes .

## 2019-01-12 NOTE — Telephone Encounter (Signed)
Is it possible to bring him in for an ECG to document rhythm. If he has converted, will be able to cancel DCCV.

## 2019-01-13 ENCOUNTER — Other Ambulatory Visit
Admission: RE | Admit: 2019-01-13 | Discharge: 2019-01-13 | Disposition: A | Discharge status: Home / Self Care | Payer: Medicare Other | Source: Ambulatory Visit | Attending: Cardiovascular Disease | Admitting: Cardiovascular Disease

## 2019-01-13 ENCOUNTER — Ambulatory Visit: Payer: 59

## 2019-01-13 ENCOUNTER — Telehealth: Payer: Self-pay | Admitting: Cardiovascular Disease

## 2019-01-13 ENCOUNTER — Telehealth: Payer: Self-pay

## 2019-01-13 DIAGNOSIS — I4892 Unspecified atrial flutter: Secondary | ICD-10-CM | POA: Insufficient documentation

## 2019-01-13 DIAGNOSIS — R42 Dizziness and giddiness: Secondary | ICD-10-CM | POA: Insufficient documentation

## 2019-01-13 LAB — DIGOXIN LEVEL: Digoxin Level: 0.5 ng/mL — ABNORMAL LOW (ref 0.8–2.0)

## 2019-01-13 MED ORDER — MECLIZINE HCL 25 MG PO TABS: 25.0000 mg | ORAL_TABLET | Freq: Three times a day (TID) | ORAL | 1 refills | Status: DC | PRN

## 2019-01-13 NOTE — Telephone Encounter (Signed)
Symptoms sound vertiginous.  That said, he was supposed to have a digoxin level when he had labs drawn recently, and  I do not see that it was drawn. He should have this  Drawn today or  It can be added on to previous draw. Assuming digoxin level is ok, he can try meclizine 25mg  tid prn, #30, 1 refill. If dizziness persists, he should f/u w/ primary care.

## 2019-01-13 NOTE — Telephone Encounter (Signed)
STAT if patient feels like he/she is going to faint   1) Are you dizzy now? No   2) Do you feel faint or have you passed out? No   3) Do you have any other symptoms?  No   4) Have you checked your HR and BP (record if available)? See previous notes with VS  Patient concerned that this is an issue with med combination interactions as per conversations with pharmacy.  Patient states he is ok but just wants [place] to know this .

## 2019-01-13 NOTE — Telephone Encounter (Signed)
Pt called and verbalized understanding of the recommendations. I spoke with the lab and they need a new specimen for a Digoxin level pt to have drawn today.

## 2019-01-13 NOTE — Telephone Encounter (Signed)
Returned call to Pt.  Advised EKG visit made for 01/17/2019 at 11:00 am to document rhythm.  Pt reporting that he is having dizziness.  The dizziness happens when he is lying in bed, not related to position change.  Pt states he spins so hard he has to get out of bed and carefully move around.  Then he takes an anxiety pill and is eventually able to go back to sleep.  Pt wondering if it is related to the new medication he is on.  Advised this nurse would forward to C. Sharolyn Douglas for review.  Advised to continue his current medications because it sounds like they may have assisted him back into SR.  Pt states he can tolerate dizziness at this time.  Advised if over the weekend the dizziness worsens he should call [facility] office and speak to on call.  Advised I would forward to CB today for advisement.

## 2019-01-13 NOTE — Telephone Encounter (Signed)
The patient has been notified of the result and verbalized understanding.  All questions (if any) were answered. Wilma Flavin, RN 01/13/2019 9:32 AM

## 2019-01-13 NOTE — Telephone Encounter (Signed)
Patient calling back  States that he has had dizzy spells the last couple nights where he feels faint Patient believes it may be medication related  Would like to know since BP and HR seem to be back to normal may we do some altercations to medications Please call to discuss

## 2019-01-16 ENCOUNTER — Ambulatory Visit: Payer: Medicare Other | Admitting: Physician Assistant

## 2019-01-16 ENCOUNTER — Other Ambulatory Visit: Payer: Self-pay | Admitting: Family Medicine

## 2019-01-17 ENCOUNTER — Other Ambulatory Visit
Admission: RE | Admit: 2019-01-17 | Discharge: 2019-01-17 | Disposition: A | Discharge status: Home / Self Care | Payer: Medicare Other | Source: Ambulatory Visit | Attending: Cardiovascular Disease | Admitting: Cardiovascular Disease

## 2019-01-17 ENCOUNTER — Other Ambulatory Visit: Payer: Self-pay

## 2019-01-17 ENCOUNTER — Ambulatory Visit: Payer: 59

## 2019-01-17 ENCOUNTER — Other Ambulatory Visit (INDEPENDENT_AMBULATORY_CARE_PROVIDER_SITE_OTHER): Payer: Medicare Other

## 2019-01-17 VITALS — BP 150/96 | HR 93

## 2019-01-17 DIAGNOSIS — I4891 Unspecified atrial fibrillation: Secondary | ICD-10-CM

## 2019-01-17 DIAGNOSIS — Z20828 Contact with and (suspected) exposure to other viral communicable diseases: Secondary | ICD-10-CM | POA: Diagnosis not present

## 2019-01-17 DIAGNOSIS — Z01812 Encounter for preprocedural laboratory examination: Secondary | ICD-10-CM | POA: Insufficient documentation

## 2019-01-17 NOTE — Progress Notes (Deleted)
1.) Reason for visit: EKG prior to 01/17/19 Dccv  2.) Name of MD requesting visit: Dr. Rockey Situ  3.) H&P: Patient seen today for nurse visit EKG prior to his scheduled 01/20/19 dccv with Dr. Rockey Situ. Patient denies palpitations, sob, chest pain, swelling. Patient does endorse fatigue and some lightheadiness that he feels is related to his new medications. Patient is compliant with his prescribed medications, he has not missed any doses of Eliquis. Patient did bring in a copy of his BP and HR readings. His BP/HR readings have improved and the readings he provided demonstrates controlled rates and BP. Patient reports one episode of Bradycardia with a HR in the 40'sbpm while in bed. Patient currently asymptomatic. He is anxious about the 01/20/19 dccv. Reassurance given to the patient.    4.) ROS related to problem: patient's BP elevated at today's visit 150/96. Patient states that his BP is usually elevated in the doctors office but are well controlled when he is home. EKG performed that demonstrates Atrial Fibrillation with a HR of 90bpm.  5.) Assessment and plan per MD: Today's EKG and the patient's home BP and HR's reviewed by Dr. Rockey Situ. Plan is to continue with the 01/20/19 dccv as scheduled. No changes at this time.

## 2019-01-17 NOTE — Progress Notes (Unsigned)
1.) Reason for visit: EKG prior to 01/17/19 Dccv  2.) Name of MD requesting visit: Dr. Rockey Situ  3.) H&P: Patient seen today for nurse visit EKG prior to his scheduled 01/20/19 dccv with Dr. Rockey Situ. Patient denies palpitations, sob, chest pain, swelling. Patient does endorse fatigue and some lightheadiness that he feels is related to his new medications. Patient is compliant with his prescribed medications, he has not missed any doses of Eliquis. Patient did bring in a copy of his BP and HR readings. His BP/HR readings have improved and the readings he provided demonstrates controlled rates and BP. Patient reports one episode of Bradycardia with a HR in the 40'sbpm while in bed. Patient currently asymptomatic. He is anxious about the 01/20/19 dccv. Reassurance given to the patient.    4.) ROS related to problem: patient's BP elevated at today's visit 150/96. Patient states that his BP is usually elevated in the doctors office but are well controlled when he is home. EKG performed that demonstrates Atrial Fibrillation with a HR of 90bpm.  5.) Assessment and plan per MD: Today's EKG and the patient's home BP and HR's reviewed by Dr. Rockey Situ. Plan is to continue with the 01/20/19 dccv as scheduled. No changes at this time.

## 2019-01-18 LAB — SARS CORONAVIRUS 2 (TAT 6-24 HRS): SARS Coronavirus 2: NEGATIVE

## 2019-01-20 ENCOUNTER — Encounter
Admission: RE | Disposition: A | Discharge status: Home / Self Care | Payer: Self-pay | Source: Home / Self Care | Attending: Cardiovascular Disease

## 2019-01-20 ENCOUNTER — Other Ambulatory Visit: Payer: Self-pay

## 2019-01-20 ENCOUNTER — Encounter: Payer: Self-pay | Admitting: Oncology

## 2019-01-20 ENCOUNTER — Ambulatory Visit: Payer: Medicare Other | Admitting: Anesthesiology

## 2019-01-20 ENCOUNTER — Ambulatory Visit
Admission: RE | Admit: 2019-01-20 | Discharge: 2019-01-20 | Disposition: A | Discharge status: Home / Self Care | Payer: Medicare Other | Attending: Cardiovascular Disease | Admitting: Cardiovascular Disease

## 2019-01-20 ENCOUNTER — Encounter: Payer: Self-pay | Admitting: *Deleted

## 2019-01-20 DIAGNOSIS — R0602 Shortness of breath: Secondary | ICD-10-CM | POA: Diagnosis not present

## 2019-01-20 DIAGNOSIS — Z7982 Long term (current) use of aspirin: Secondary | ICD-10-CM | POA: Insufficient documentation

## 2019-01-20 DIAGNOSIS — I4819 Other persistent atrial fibrillation: Secondary | ICD-10-CM | POA: Diagnosis not present

## 2019-01-20 DIAGNOSIS — Z952 Presence of prosthetic heart valve: Secondary | ICD-10-CM | POA: Insufficient documentation

## 2019-01-20 DIAGNOSIS — F419 Anxiety disorder, unspecified: Secondary | ICD-10-CM | POA: Diagnosis not present

## 2019-01-20 DIAGNOSIS — I10 Essential (primary) hypertension: Secondary | ICD-10-CM | POA: Insufficient documentation

## 2019-01-20 DIAGNOSIS — H9190 Unspecified hearing loss, unspecified ear: Secondary | ICD-10-CM | POA: Diagnosis not present

## 2019-01-20 DIAGNOSIS — I2510 Atherosclerotic heart disease of native coronary artery without angina pectoris: Secondary | ICD-10-CM | POA: Insufficient documentation

## 2019-01-20 DIAGNOSIS — K5900 Constipation, unspecified: Secondary | ICD-10-CM | POA: Insufficient documentation

## 2019-01-20 DIAGNOSIS — G629 Polyneuropathy, unspecified: Secondary | ICD-10-CM | POA: Diagnosis not present

## 2019-01-20 DIAGNOSIS — I4891 Unspecified atrial fibrillation: Secondary | ICD-10-CM | POA: Diagnosis not present

## 2019-01-20 DIAGNOSIS — Z79899 Other long term (current) drug therapy: Secondary | ICD-10-CM | POA: Diagnosis not present

## 2019-01-20 HISTORY — PX: CARDIOVERSION: SHX1299

## 2019-01-20 SURGERY — CARDIOVERSION
Anesthesia: General

## 2019-01-20 MED ORDER — SODIUM CHLORIDE 0.9 % IV SOLN
INTRAVENOUS | Status: DC | PRN
  Administered 2019-01-20: 07:00:00 via INTRAVENOUS

## 2019-01-20 MED ORDER — PROPOFOL 10 MG/ML IV BOLUS
INTRAVENOUS | Status: DC | PRN
  Administered 2019-01-20: 40 mg via INTRAVENOUS

## 2019-01-20 NOTE — Anesthesia Preprocedure Evaluation (Signed)
Anesthesia Evaluation  Patient identified by MRN, date of birth, ID band Patient awake    Reviewed: Allergy & Precautions, H&P , NPO status , Patient's Chart, lab work & pertinent test results, reviewed documented beta blocker date and time   History of Anesthesia Complications Negative for: history of anesthetic complications  Airway Mallampati: I  TM Distance: >3 FB Neck ROM: full    Dental  (+) Dental Advidsory Given, Teeth Intact, Caps   Pulmonary neg pulmonary ROS,    Pulmonary exam normal        Cardiovascular Exercise Tolerance: Good hypertension, (-) angina+ CAD and +CHF  (-) Past MI, (-) Cardiac Stents and (-) CABG + dysrhythmias Atrial Fibrillation + Valvular Problems/Murmurs (s/p TAVR)  Rhythm:irregular Rate:Abnormal     Neuro/Psych PSYCHIATRIC DISORDERS Anxiety negative neurological ROS     GI/Hepatic negative GI ROS, Neg liver ROS,   Endo/Other  negative endocrine ROS  Renal/GU negative Renal ROS  negative genitourinary   Musculoskeletal   Abdominal   Peds  Hematology negative hematology ROS (+)   Anesthesia Other Findings Past Medical History: No date: Anxiety No date: Arthritis of knee No date: Atypical atrial flutter ([facility])     Comment:  a. Dx 12/15/2018. CHA2DS2VASc 3-->eliquis. No date: Carotid artery disease ([facility]) No date: Constipation     Comment:  due to Myrbetriq-   No date: Hearing loss     Comment:  wears hearing aids No date: Hypertension No date: Neuropathy No date: Nonobstructive Coronary artery disease     Comment:  a. 07/2018 Cath: LMN nl, LAD/LCX/RCA no significant dzs.               EF 55-65%. No date: S/P TAVR (transcatheter aortic valve replacement)     Comment:  a. 09/2018 TAVR: 26 mm Edwards Sapien THV via the TF               approach; b. 10/2018 Echo: EF 55-60%. Nl RV fxn. RVSP               20.13mmHg. Mild BAE. No AI or perivalvular leak. No date: Severe aortic stenosis   Reproductive/Obstetrics negative OB ROS                             Anesthesia Physical Anesthesia Plan  ASA: II  Anesthesia Plan: General   Post-op Pain Management:    Induction: Intravenous  PONV Risk Score and Plan: 2 and Propofol infusion and TIVA  Airway Management Planned: Natural Airway and Nasal Cannula  Additional Equipment:   Intra-op Plan:   Post-operative Plan:   Informed Consent: I have reviewed the patients History and Physical, chart, labs and discussed the procedure including the risks, benefits and alternatives for the proposed anesthesia with the patient or authorized representative who has indicated his/her understanding and acceptance.     Dental Advisory Given  Plan Discussed with: Anesthesiologist, CRNA and Surgeon  Anesthesia Plan Comments:         Anesthesia Quick Evaluation

## 2019-01-20 NOTE — Anesthesia Post-op Follow-up Note (Signed)
Anesthesia QCDR form completed.        

## 2019-01-20 NOTE — Transfer of Care (Signed)
Immediate Anesthesia Transfer of Care Note  Patient: James Cardenas  Procedure(s) Performed: CARDIOVERSION (N/A )  Patient Location: PACU  Anesthesia Type:General  Level of Consciousness: awake, alert  and oriented  Airway & Oxygen Therapy: Patient Spontanous Breathing and Patient connected to nasal cannula oxygen  Post-op Assessment: Report given to RN and Post -op Vital signs reviewed and stable  Post vital signs: Reviewed and stable  Last Vitals:  Vitals Value Taken Time  BP    Temp    Pulse    Resp    SpO2      Last Pain:  Vitals:   01/20/19 0656  TempSrc: Oral  PainSc: 0-No pain         Complications: No apparent anesthesia complications

## 2019-01-20 NOTE — Progress Notes (Signed)
Called patient to prechart for office visit. Patient says he has some constipation occasionally but miralax works well for him.

## 2019-01-20 NOTE — CV Procedure (Signed)
Cardioversion procedure note For atrial fibrillation, persistent  Procedure Details:  Consent: Risks of procedure as well as the alternatives and risks of each were explained to the (patient/caregiver).  Consent for procedure obtained.  Time Out: Verified patient identification, verified procedure, site/side was marked, verified correct patient position, special equipment/implants available, medications/allergies/relevent history reviewed, required imaging and test results available.  Performed  Patient placed on cardiac monitor, pulse oximetry, supplemental oxygen as necessary.   Sedation given: propofol IV, Dr. Karenz Pacer pads placed anterior and posterior chest.   Cardioverted 1 time(s).   Cardioverted at  150 J. Synchronized biphasic Converted to NSR   Evaluation: Findings: Post procedure EKG shows: NSR Complications: None Patient did tolerate procedure well.  Time Spent Directly with the Patient:  45 minutes   Tim Whyatt Klinger, M.D., Ph.D.  

## 2019-01-20 NOTE — Anesthesia Procedure Notes (Signed)
Date/Time: 01/20/2019 7:34 AM Performed by: Nelda Marseille, CRNA Pre-anesthesia Checklist: Patient identified, Emergency Drugs available, Suction available, Patient being monitored and Timeout performed Oxygen Delivery Method: Nasal cannula

## 2019-01-20 NOTE — Anesthesia Postprocedure Evaluation (Signed)
Anesthesia Post Note  Patient: James Cardenas  Procedure(s) Performed: CARDIOVERSION (N/A )  Patient location during evaluation: PACU Anesthesia Type: General Level of consciousness: awake and alert Pain management: pain level controlled Vital Signs Assessment: post-procedure vital signs reviewed and stable Respiratory status: spontaneous breathing, nonlabored ventilation, respiratory function stable and patient connected to nasal cannula oxygen Cardiovascular status: blood pressure returned to baseline and stable Postop Assessment: no apparent nausea or vomiting Anesthetic complications: no     Last Vitals:  Vitals:   01/20/19 0830 01/20/19 0835  BP: (!) 141/94   Pulse: 77 72  Resp: 12 14  SpO2: 98% 96%    Last Pain:  Vitals:   01/20/19 0830  TempSrc:   PainSc: 0-No pain                 Martha Clan

## 2019-01-21 NOTE — H&P (Signed)
H&P Addendum, pre-cardioversion  Patient was seen and evaluated prior to -cardioversion procedure Symptoms, prior testing details again confirmed with the patient Patient examined, no significant change from prior exam Lab work reviewed in detail personally by myself Patient understands risk and benefit of the procedure, willing to proceed  Signed, Tim Gollan, MD, Ph.D [facility]  

## 2019-01-23 ENCOUNTER — Inpatient Hospital Stay (HOSPITAL_BASED_OUTPATIENT_CLINIC_OR_DEPARTMENT_OTHER): Payer: Medicare Other | Admitting: Oncology

## 2019-01-23 ENCOUNTER — Other Ambulatory Visit: Payer: Self-pay

## 2019-01-23 ENCOUNTER — Inpatient Hospital Stay: Payer: Medicare Other | Attending: Oncology

## 2019-01-23 VITALS — BP 170/79 | HR 62 | Temp 97.9°F | Resp 18 | Wt 201.8 lb

## 2019-01-23 DIAGNOSIS — I10 Essential (primary) hypertension: Secondary | ICD-10-CM | POA: Diagnosis not present

## 2019-01-23 DIAGNOSIS — Z79899 Other long term (current) drug therapy: Secondary | ICD-10-CM | POA: Insufficient documentation

## 2019-01-23 DIAGNOSIS — Z7901 Long term (current) use of anticoagulants: Secondary | ICD-10-CM | POA: Insufficient documentation

## 2019-01-23 DIAGNOSIS — D472 Monoclonal gammopathy: Secondary | ICD-10-CM

## 2019-01-23 DIAGNOSIS — I4891 Unspecified atrial fibrillation: Secondary | ICD-10-CM | POA: Diagnosis not present

## 2019-01-23 DIAGNOSIS — F419 Anxiety disorder, unspecified: Secondary | ICD-10-CM | POA: Insufficient documentation

## 2019-01-23 LAB — CBC WITH DIFFERENTIAL/PLATELET
Abs Immature Granulocytes: 0.01 10*3/uL (ref 0.00–0.07)
Basophils Absolute: 0.1 10*3/uL (ref 0.0–0.1)
Basophils Relative: 1 %
Eosinophils Absolute: 0.4 10*3/uL (ref 0.0–0.5)
Eosinophils Relative: 8 %
HCT: 40.4 % (ref 39.0–52.0)
Hemoglobin: 13.6 g/dL (ref 13.0–17.0)
Immature Granulocytes: 0 %
Lymphocytes Relative: 20 %
Lymphs Abs: 0.9 10*3/uL (ref 0.7–4.0)
MCH: 32.2 pg (ref 26.0–34.0)
MCHC: 33.7 g/dL (ref 30.0–36.0)
MCV: 95.5 fL (ref 80.0–100.0)
Monocytes Absolute: 0.6 10*3/uL (ref 0.1–1.0)
Monocytes Relative: 13 %
Neutro Abs: 2.6 10*3/uL (ref 1.7–7.7)
Neutrophils Relative %: 58 %
Platelets: 141 10*3/uL — ABNORMAL LOW (ref 150–400)
RBC: 4.23 MIL/uL (ref 4.22–5.81)
RDW: 13 % (ref 11.5–15.5)
WBC: 4.6 10*3/uL (ref 4.0–10.5)
nRBC: 0 % (ref 0.0–0.2)

## 2019-01-23 LAB — COMPREHENSIVE METABOLIC PANEL
ALT: 21 U/L (ref 0–44)
AST: 29 U/L (ref 15–41)
Albumin: 4.2 g/dL (ref 3.5–5.0)
Alkaline Phosphatase: 79 U/L (ref 38–126)
Anion gap: 7 (ref 5–15)
BUN: 21 mg/dL (ref 8–23)
CO2: 25 mmol/L (ref 22–32)
Calcium: 9 mg/dL (ref 8.9–10.3)
Chloride: 106 mmol/L (ref 98–111)
Creatinine, Ser: 0.98 mg/dL (ref 0.61–1.24)
GFR calc Af Amer: >60 mL/min (ref >60)
GFR calc non Af Amer: >60 mL/min (ref >60)
Glucose, Bld: 99 mg/dL (ref 70–99)
Potassium: 4 mmol/L (ref 3.5–5.1)
Sodium: 138 mmol/L (ref 135–145)
Total Bilirubin: 1.9 mg/dL — ABNORMAL HIGH (ref 0.3–1.2)
Total Protein: 7.7 g/dL (ref 6.5–8.1)

## 2019-01-23 LAB — BILIRUBIN, DIRECT: Bilirubin, Direct: 0.4 mg/dL — ABNORMAL HIGH (ref 0.0–0.2)

## 2019-01-23 NOTE — Progress Notes (Signed)
Hematology/Oncology follow up  note [facility] [phone] [phone]) [phone]   Patient Care Team: Olin Hauser, DO as PCP - General (Family Medicine) Minna Merritts, MD as PCP - Cardiology (Cardiology) Renata Caprice as Physician Assistant (Orthopedic Surgery) Nickie Retort, MD as Consulting Physician (Urology) Gardiner Barefoot, DPM as Consulting Physician (Podiatry) Stark Klein Bing Neighbors, NP as Nurse Practitioner (Neurology)  REFERRING PROVIDER: Dr.Shah  CHIEF COMPLAINTS/PURPOSE OF CONSULTATION:  Follow-up for MGUS  HISTORY OF PRESENTING ILLNESS:  James Cardenas is a  78 y.o.  male with PMH listed below who was referred to me for evaluation of elevated M spike.  Patient follows up with [facility] clinic Neurology Dr. Bobbye Riggs for evaluation of neuropathy.  Patient states that symptom has started in 2060 has slightly progressed.  Mostly focus on posterior lower extremity, feels like bilateral aching, soreness, tightness.  Intermittently he will also have numbness and tingling in his feet without burning and pain.  He has had extensive workup done including normal thyroid function, normal B12, ESR, ANA, rheumatoid factor, diabetes screening, heavy metal screening.Marland Kitchen  He has had SPEP and immunofixation which showed IgG monoclonal antibody with kappa light chain specificity, M spike 0.7 g/dL.  Patient denies any bone pain.  He has good energy level and remains very active.  No raynaud's syndrome   INTERVAL HISTORY James Cardenas is a 78 y.o. male who has above history reviewed by me today present for follow-up visit for management of MGUS. #During the interval, patient has had hospitalization due to atrial fibrillation/flutter, rapid ventricular response, was seen by cardiology.  Patient underwent cardioversion procedure on 01/20/2019. Denies any new bone pain, nausea, vomiting, diarrhea, unintentional weight loss, abdominal pain.    Review of Systems  Constitutional: Negative for chills, fever, malaise/fatigue and weight loss.  HENT: Negative for ear discharge, ear pain, nosebleeds and sore throat.   Eyes: Negative for blurred vision, double vision, photophobia, pain and redness.  Respiratory: Negative for cough, hemoptysis, sputum production, shortness of breath and wheezing.   Cardiovascular: Negative for chest pain, palpitations, orthopnea and leg swelling.  Gastrointestinal: Negative for abdominal pain, blood in stool, nausea and vomiting.  Genitourinary: Negative for dysuria and urgency.  Musculoskeletal: Negative for back pain, myalgias and neck pain.  Skin: Negative for itching and rash.  Neurological: Negative for dizziness, tingling, tremors and focal weakness.  Endo/Heme/Allergies: Negative for environmental allergies. Does not bruise/bleed easily.  Psychiatric/Behavioral: Negative for depression and hallucinations.    MEDICAL HISTORY:  Past Medical History:  Diagnosis Date  . Anxiety   . Arthritis of knee   . Atypical atrial flutter ([place])    a. Dx 12/15/2018. CHA2DS2VASc 3-->eliquis.  . Carotid artery disease ([place])   . 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 Hearing loss    wears hearing aids  . Hypertension   . Neuropathy   . Nonobstructive Coronary artery disease    a. 07/2018 Cath: LMN nl, LAD/LCX/RCA no significant dzs. EF 55-65%.  . S/P TAVR (transcatheter aortic valve replacement)    a. 09/2018 TAVR: 26 mm Edwards Sapien THV via the TF approach; b. 10/2018 Echo: EF 55-60%. Nl RV fxn. RVSP 20.27mHg. Mild BAE. No AI or perivalvular leak.  . Severe aortic stenosis     SURGICAL HISTORY: Past Surgical History:  Procedure Laterality Date  . CARDIOVERSION N/A 01/20/2019   Procedure: CARDIOVERSION;  Surgeon: GMinna Merritts MD;  Location: [facility] ORS;  Service: Cardiovascular;  Laterality:  N/A;  . COLONOSCOPY    . EYE SURGERY Bilateral    cataract  . HERNIA REPAIR Left    Inguinal  . NECK SURGERY   2011   Cervical Fusion  . RIGHT HEART CATH AND CORONARY ANGIOGRAPHY N/A 07/28/2018   Procedure: RIGHT HEART CATH AND CORONARY ANGIOGRAPHY;  Surgeon: Minna Merritts, MD;  Location: [place] CV LAB;  Service: Cardiovascular;  Laterality: N/A;  . TEE WITHOUT CARDIOVERSION N/A 09/13/2018   Procedure: TRANSESOPHAGEAL ECHOCARDIOGRAM (TEE);  Surgeon: Sherren Mocha, MD;  Location: [place];  Service: Open Heart Surgery;  Laterality: N/A;  . TRANSCATHETER AORTIC VALVE REPLACEMENT, TRANSFEMORAL  09/13/2018  . TRANSCATHETER AORTIC VALVE REPLACEMENT, TRANSFEMORAL N/A 09/13/2018   Procedure: TRANSCATHETER AORTIC VALVE REPLACEMENT, TRANSFEMORAL;  Surgeon: Sherren Mocha, MD;  Location: [place];  Service: Open Heart Surgery;  Laterality: N/A;    SOCIAL HISTORY: Social History   Socioeconomic History  . Marital status: Divorced    Spouse name: Not on file  . Number of children: Not on file  . Years of education: Not on file  . Highest education level: Bachelor's degree (e.g., BA, AB, BS)  Occupational History  . Occupation: Retired Designer, multimedia, Public affairs consultant / heat treating)  Social Needs  . Financial resource strain: Not hard at all  . Food insecurity    Worry: Never true    Inability: Never true  . Transportation needs    Medical: No    Non-medical: No  Tobacco Use  . Smoking status: Never Smoker  . Smokeless tobacco: Never Used  Substance and Sexual Activity  . Alcohol use: Yes    Alcohol/week: 7.0 standard drinks    Types: 1 Cans of beer, 6 Glasses of wine per week  . Drug use: No  . Sexual activity: Yes  Lifestyle  . Physical activity    Days per week: 3 days    Minutes per session: 30 min  . Stress: Not at all  Relationships  . Social connections    Talks on phone: More than three times a week    Gets together: More than three times a week    Attends religious service: More than 4 times per year    Active member of club or organization: No    Attends meetings of clubs or  organizations: Never    Relationship status: Divorced  . Intimate partner violence    Fear of current or ex partner: No    Emotionally abused: No    Physically abused: No    Forced sexual activity: No  Other Topics Concern  . Not on file  Social History Narrative  . Not on file    FAMILY HISTORY: Family History  Problem Relation Age of Onset  . Thyroid disease Mother   . Lung cancer Father   . Pancreatic cancer Brother   . Alzheimer's disease Sister   . Prostate cancer Neg Hx   . Bladder Cancer Neg Hx   . Kidney cancer Neg Hx     ALLERGIES:  is allergic to nickel.  MEDICATIONS:  Current Outpatient Medications  Medication Sig Dispense Refill  . Alpha-Lipoic Acid 600 MG CAPS Take 600 mg by mouth every morning.     Marland Kitchen ALPRAZolam (XANAX) 0.5 MG tablet Take 1 tablet (0.5 mg total) by mouth 2 (two) times daily as needed for anxiety. 60 tablet 2  . apixaban (ELIQUIS) 5 MG TABS tablet Take 1 tablet (5 mg total) by mouth 2 (two) times daily. 60 tablet 1  .  Azelaic Acid 15 % cream Apply 1 application topically daily. After skin is thoroughly washed and patted dry, gently but thoroughly massage a thin film of azelaic acid cream into the affected area once daily    . Carboxymethylcellul-Glycerin (LUBRICATING EYE DROPS OP) Place 1 drop into both eyes 2 (two) times a day.     . cholecalciferol (VITAMIN D3) 25 MCG (1000 UT) tablet Take 1,000 Units by mouth daily.    . digoxin (LANOXIN) 0.25 MG tablet Take 1 tablet (0.25 mg total) by mouth daily. 30 tablet 3  . diltiazem (CARDIZEM CD) 180 MG 24 hr capsule Take 1 capsule (180 mg total) by mouth daily. 30 capsule 2  . meclizine (ANTIVERT) 25 MG tablet Take 1 tablet (25 mg total) by mouth 3 (three) times daily as needed for dizziness. 30 tablet 1  . mirabegron ER (MYRBETRIQ) 50 MG TB24 tablet Take 1 tablet (50 mg total) by mouth daily. 90 tablet 3  . Multiple Vitamin (MULTIVITAMIN WITH MINERALS) TABS tablet Take 1 tablet by mouth daily at 3 pm.  Centrum    . OVER THE COUNTER MEDICATION Take 1 tablet by mouth at bedtime as needed (sleep). GNC PREVENTIVE NUTRITION TRI-SLEEP    . polyethylene glycol (MIRALAX / GLYCOLAX) packet Take 17 g by mouth 2 (two) times daily as needed (constipation).     . rosuvastatin (CRESTOR) 10 MG tablet Take 1 tablet (10 mg total) by mouth daily. 90 tablet 3  . sildenafil (REVATIO) 20 MG tablet Take up to 5 pills about 30 min prior to sex 50 tablet 8  . tretinoin (RETIN-A) 0.1 % cream Apply 1 application topically at bedtime.   0  . amoxicillin (AMOXIL) 500 MG tablet Take 2,000 mg (4 pills) one hour prior to all dental visits. There are enough for two visits in this bottle. (Patient not taking: Reported on 01/20/2019) 8 tablet 6   No current facility-administered medications for this visit.      PHYSICAL EXAMINATION: ECOG PERFORMANCE STATUS: 0 - Asymptomatic Vitals:   01/23/19 0958  BP: (!) 170/79  Pulse: 62  Resp: 18  Temp: 97.9 F (36.6 C)   Filed Weights   01/23/19 0958  Weight: 201 lb 12.8 oz (91.5 kg)    Physical Exam  Constitutional: He is oriented to person, place, and time. No distress.  HENT:  Head: Normocephalic and atraumatic.  Nose: Nose normal.  Mouth/Throat: Oropharynx is clear and moist. No oropharyngeal exudate.  Eyes: Pupils are equal, round, and reactive to light. Conjunctivae and EOM are normal. No scleral icterus.  Neck: Normal range of motion. Neck supple. No JVD present.  Cardiovascular: Normal rate and regular rhythm.  Murmur heard. Pulmonary/Chest: Effort normal and breath sounds normal. No respiratory distress. He has no wheezes. He has no rales. He exhibits no tenderness.  Abdominal: Soft. Bowel sounds are normal. He exhibits no distension. There is no abdominal tenderness. There is no rebound and no guarding.  Musculoskeletal: Normal range of motion.        General: No edema.  Lymphadenopathy:    He has no cervical adenopathy.  Neurological: He is alert and  oriented to person, place, and time. No cranial nerve deficit. He exhibits normal muscle tone. Coordination normal.  Skin: Skin is warm and dry. No rash noted. He is not diaphoretic. No erythema.  Psychiatric: Memory, affect and judgment normal.     LABORATORY DATA:  I have reviewed the data as listed Lab Results  Component Value Date  WBC 4.6 01/23/2019   HGB 13.6 01/23/2019   HCT 40.4 01/23/2019   MCV 95.5 01/23/2019   PLT 141 (L) 01/23/2019   Recent Labs    07/25/18 1241 09/09/18 1057  12/16/18 0435 01/11/19 1235 01/23/19 0930 01/23/19 1034  NA 137 136   < > 139 138 138  --   K 4.3 4.7   < > 3.8 4.0 4.0  --   CL 106 107   < > 109 105 106  --   CO2 23 20*   < > '22 24 25  ' --   GLUCOSE 102* 114*   < > 96 116* 99  --   BUN 27* 16   < > 22 27* 21  --   CREATININE 0.89 0.86   < > 0.76 0.93 0.98  --   CALCIUM 8.8* 9.1   < > 8.6* 9.3 9.0  --   GFRNONAA >60 >60   < > >60 >60 >60  --   GFRAA >60 >60   < > >60 >60 >60  --   PROT 7.2 6.8  --   --   --  7.7  --   ALBUMIN 4.1 3.9  --   --   --  4.2  --   AST 28 37  --   --   --  29  --   ALT 22 39  --   --   --  21  --   ALKPHOS 68 72  --   --   --  79  --   BILITOT 1.4* 1.6*  --   --   --  1.9*  --   BILIDIR  --   --   --   --   --   --  0.4*   < > = values in this interval not displayed.   09/29/2017 Vitamin B12 920 Labs done at [facility] 06/08/2017 Folate 22.3    ASSESSMENT & PLAN:  1. MGUS (monoclonal gammopathy of unknown significance)   2. Hyperbilirubinemia   #MGUS, IgG type. Labs are reviewed and discussed with patient.  Multiple myeloma panel is still pending at the time of dictation. Stable kidney function.  No anemia. Recommend continue follow-up every 6 months.  #Hyperbilirubinemia, direct bilirubin was added and was 0.4.  Likely this is increase of indirect bilirubin. Patient probably have underlying Gilbert's syndrome  All questions were answered. The patient knows to call the clinic with any problems questions  or concerns.  Return of visit: 6 months.  Earlie Server, MD, PhD Hematology Oncology [facility] Pager- [phone] 01/23/2019

## 2019-01-24 LAB — PROTEIN ELECTROPHORESIS, SERUM
A/G Ratio: 1.2 (ref 0.7–1.7)
Albumin ELP: 3.7 g/dL (ref 2.9–4.4)
Alpha-1-Globulin: 0.2 g/dL (ref 0.0–0.4)
Alpha-2-Globulin: 0.5 g/dL (ref 0.4–1.0)
Beta Globulin: 1 g/dL (ref 0.7–1.3)
Gamma Globulin: 1.4 g/dL (ref 0.4–1.8)
Globulin, Total: 3.2 g/dL (ref 2.2–3.9)
M-Spike, %: 0.7 g/dL — ABNORMAL HIGH
Total Protein ELP: 6.9 g/dL (ref 6.0–8.5)

## 2019-01-24 LAB — IGG: IgG (Immunoglobin G), Serum: 1493 mg/dL (ref 603–1613)

## 2019-01-25 ENCOUNTER — Telehealth: Payer: Self-pay | Admitting: Cardiovascular Disease

## 2019-01-25 NOTE — Telephone Encounter (Signed)
Patient states he has had a rash since his procedure. A red circular rash in the middle of his chest.  Pt c/o medication issue:  1. Name of Medication: Eliquis  2. How are you currently taking this medication (dosage and times per day)?   3. Are you having a reaction (difficulty breathing--STAT)? No   4. What is your medication issue? Dizziness every other day and weakness, and swelling in his feet  Pt c/o swelling: STAT is pt has developed SOB within 24 hours  1) How much weight have you gained and in what time span? 4 pounds over the last week  2) If swelling, where is the swelling located? Bilateral feet  3) Are you currently taking a fluid pill?   4) Are you currently SOB? no  5) Do you have a log of your daily weights (if so, list)? 191 to 195 over the last 4 days 6) Have you gained 3 pounds in a day or 5 pounds in a week? Yes 7) Have you traveled recently? no   States his HR has stayed in the mid 50s low 60s

## 2019-01-25 NOTE — Telephone Encounter (Signed)
Spoke with the patient. Patient sts tha his BP and HR have been stable since his 01/20/19 dccv. He monitor's his BP and HR several times daily. Pt sts that his BP's have been consistently in the 130-140's/70-80's, HR's in the mid 50's-60's. Pt also weighs his self daily. His baseline weight is around 190-191lbs. Pt sts that his weight last week was up to 195lb. His weight today is back down to 193lbs.  Patient denies sob, palpitations, chest pain. Patient sts that he feels his heart is doing well. He is having some dizziness and fatigue that he feels is related to his medications. He is also having some mile LE edema. Patient sts that he has been sitting at his desk a lot trying to catch up on work. He aslo complains of a redness at the center of his chest.  Adv the patient that it is not uncommon to have some skin irration after dccv. Adv him to continue to monitor the are and let us know if the irritation worsens. Patient's weight is up 2 lbs from his baseline. Patient does sts that previously to his AFIB he exercised daily and has not been exercising as he use to. Adv the patient to continue to monitor his weight and to contact the office if weight is up 3lb in a day 5lbs in a week. Adv the patient to elevate his feet as much as possible and to contact the office if no improvement or if swelling worsens.  Patient verbalized understanding to the instructions given and voiced appreciation for the call.  FYI fwd to Dr. Rockey Situ

## 2019-01-25 NOTE — Telephone Encounter (Signed)
There is adhesive on the pad that are very strong likely caused some skin irritation coming off

## 2019-01-30 ENCOUNTER — Ambulatory Visit: Payer: Medicare Other | Admitting: Cardiovascular Disease

## 2019-01-31 ENCOUNTER — Telehealth: Payer: Self-pay | Admitting: Cardiovascular Disease

## 2019-01-31 NOTE — Telephone Encounter (Signed)
Spoke to patient, I went ahead and moved up his appt up to this Friday based on current issues.   Pt having fatigue with current medications post DCCV.   Pt agreeable with POC and [facility] for [place].   No chest pain, SOB or acute sx.   Advised pt to call for any further questions or concerns.

## 2019-01-31 NOTE — Telephone Encounter (Signed)
Pt c/o medication issue:  1. Name of Medication: digoxin, diltiazem  2. How are you currently taking this medication (dosage and times per day)? 1 tablet daily for each   3. Are you having a reaction (difficulty breathing--STAT)? Dizziness, weakness, headaches   4. What is your medication issue? Patient calling and states he is having complications with medications. States that he was having the same complications with diltiazem then digoxin was added and it has magnified conditions.  Patient has no energy, very weak, headaches are constant.  Sometimes he has spinning attacks.  States his BP has been in 130's-140's / 70's-80's which is higher then it use to be but his HR has been perfect.  Wants to know if maybe he can start back on losartan.  Please call to discuss and reevaluate medications.

## 2019-01-31 NOTE — Progress Notes (Signed)
Cardiology Office Note    Date:  02/03/2019   ID:  James Cardenas, DOB 30-Oct-1940, MRN [id number]  PCP:  Olin Hauser, DO  Cardiologist:  Ida Rogue, MD  Electrophysiologist:  None   Chief Complaint: Follow-up cardioversion  History of Present Illness:   ALTER SUPINO is a 78 y.o. male with history of nonobstructive CAD, aortic stenosis status post TAVR in 09/2018, atypical atrial flutter on Eliquis status post recent DCCV on 01/20/2019, mild carotid artery disease, pulmonary nodules, hypertension, hyperlipidemia, MGUS, and anxiety who presents for follow-up of cardioversion.  Patient has a known history of aortic stenosis which was followed over the years and subsequently worked up for TAVR in the setting of worsening symptoms earlier in 2020.  Diagnostic cath showed no significant CAD.  Carotid ultrasound showed mild carotid artery disease.  Pre-TAVR chest CT showed small pulmonary nodules.  Patient subsequently underwent successful TAVR in 09/2018 with significant improvement in exercise tolerance following procedure.  Follow-up echo in 10/2018 showed normal EF with normal device function without any significant aortic insufficiency or perivalvular leak.  On his last day of cardiac rehab he was noted to be tachycardic initially and remained tachycardic throughout rehab, though was asymptomatic.  In the setting of ongoing tachycardic rate he was sent to the ED where he was found to be in new onset atrial flutter with RVR.  He was placed on Eliquis and diltiazem with improved rate control and subsequently discharged.  He was seen in follow-up on 01/05/2019 indicating that initially he never took the diltiazem and continued on his home dose of losartan.  However when he began to notice tachycardic rates at home he did start diltiazem though this resulted in dizziness with associated hypotension.  Following this, it appears he spoke with someone through our office and was advised to stop  losartan and continue diltiazem as monotherapy with improvement in blood pressure with heart rates running in the 1 teens.  In this setting she notified our office again and was started on digoxin.  EKG at his visit on 01/05/2019 showed atrial flutter with RVR, 122 bpm, rare PVC, RBBB.  In early 01/2019 patient noted nonpositional dizziness while laying in bed.  He subsequently underwent successful cardioversion on 01/20/2019.  Patient contacted our office on 01/25/2019 noting a red circular rash in the middle of his chest as well as a 4 pound weight gain over the past week with bilateral swelling of his feet.  He reported his heart rates have been in the 50s to 60s bpm with BP in the 130s to 140s over 70s to 80s.  He reported a baseline weight around 190 to 191 pounds.  There was some associated dizziness and fatigue which he attributed to medications.  He reported some "spinning attacks" that he states began after starting diltiazem and were magnified after being placed on digoxin.  In this setting, his appointment was moved up to today.  Patient comes in noting since he has been placed on digoxin and diltiazem he has noted dizziness, fatigue, headaches, and lower extremity swelling.  He states "I have all the cardiac side effects."  He reports prior to stopping losartan and being placed on diltiazem as above his blood pressure typically ran 10 5-1 25 systolic with diastolics in the 0000000 to [id number] with a heart rate in the 60s to 70s bpm.  Since having to discontinue losartan with continuation of diltiazem his BP has been running in the 130s to 150s over  80s to 90s.  Following his cardioversion his heart rates have been in the 50s to 60s bpm.  He states he has checked his blood pressure and heart rate "thousands of times" since his cardioversion.  He has been compliant with anticoagulation and denies missing any doses, though he would ideally like to come off anticoagulation as he states he "bleeds easily."  No  falls, BRBPR, or melena.  He denies any chest pain, shortness of breath, presyncope, syncope.  No abdominal distention, orthopnea, PND, or early satiety.   Labs: 01/2019 - Hgb 13.6, PLT 141, potassium 4.0, BUN 21, serum creatinine 0.98, albumin 4.2, AST/ALT normal, digoxin 0.5 12/2018 - TSH normal, magnesium 2.1 11/2018 - A1c 5.6, total cholesterol 146, HDL 71, triglycerides 48, LDL 61  Past Medical History:  Diagnosis Date  . Anxiety   . Arthritis of knee   . Atypical atrial flutter ([place])    a. Dx 12/15/2018. CHA2DS2VASc 3-->eliquis.  . Carotid artery disease ([place])   . 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 Hearing loss    wears hearing aids  . Hypertension   . Neuropathy   . Nonobstructive Coronary artery disease    a. 07/2018 Cath: LMN nl, LAD/LCX/RCA no significant dzs. EF 55-65%.  . S/P TAVR (transcatheter aortic valve replacement)    a. 09/2018 TAVR: 26 mm Edwards Sapien THV via the TF approach; b. 10/2018 Echo: EF 55-60%. Nl RV fxn. RVSP 20.57mmHg. Mild BAE. No AI or perivalvular leak.  . Severe aortic stenosis     Past Surgical History:  Procedure Laterality Date  . CARDIOVERSION N/A 01/20/2019   Procedure: CARDIOVERSION;  Surgeon: Minna Merritts, MD;  Location: [facility];  Service: Cardiovascular;  Laterality: N/A;  . COLONOSCOPY    . EYE SURGERY Bilateral    cataract  . HERNIA REPAIR Left    Inguinal  . NECK SURGERY  2011   Cervical Fusion  . RIGHT HEART CATH AND CORONARY ANGIOGRAPHY N/A 07/28/2018   Procedure: RIGHT HEART CATH AND CORONARY ANGIOGRAPHY;  Surgeon: Minna Merritts, MD;  Location: [facility];  Service: Cardiovascular;  Laterality: N/A;  . TEE WITHOUT CARDIOVERSION N/A 09/13/2018   Procedure: TRANSESOPHAGEAL ECHOCARDIOGRAM (TEE);  Surgeon: Sherren Mocha, MD;  Location: [facility];  Service: Open Heart Surgery;  Laterality: N/A;  . TRANSCATHETER AORTIC VALVE REPLACEMENT, TRANSFEMORAL  09/13/2018  . TRANSCATHETER AORTIC VALVE REPLACEMENT, TRANSFEMORAL  N/A 09/13/2018   Procedure: TRANSCATHETER AORTIC VALVE REPLACEMENT, TRANSFEMORAL;  Surgeon: Sherren Mocha, MD;  Location: [facility];  Service: Open Heart Surgery;  Laterality: N/A;    Current Medications: Current Meds  Medication Sig  . Alpha-Lipoic Acid 600 MG CAPS Take 600 mg by mouth every morning.   Marland Kitchen ALPRAZolam (XANAX) 0.5 MG tablet Take 1 tablet (0.5 mg total) by mouth 2 (two) times daily as needed for anxiety.  Marland Kitchen apixaban (ELIQUIS) 5 MG TABS tablet Take 1 tablet (5 mg total) by mouth 2 (two) times daily.  . Azelaic Acid 15 % cream Apply 1 application topically daily. After skin is thoroughly washed and patted dry, gently but thoroughly massage a thin film of azelaic acid cream into the affected area once daily  . Carboxymethylcellul-Glycerin (LUBRICATING EYE DROPS OP) Place 1 drop into both eyes 2 (two) times a day.   . cholecalciferol (VITAMIN D3) 25 MCG (1000 UT) tablet Take 1,000 Units by mouth daily.  . digoxin (LANOXIN) 0.25 MG tablet Take 1 tablet (0.25 mg total) by mouth daily.  Marland Kitchen  diltiazem (CARDIZEM CD) 180 MG 24 hr capsule Take 1 capsule (180 mg total) by mouth daily.  . meclizine (ANTIVERT) 25 MG tablet Take 1 tablet (25 mg total) by mouth 3 (three) times daily as needed for dizziness.  . mirabegron ER (MYRBETRIQ) 50 MG TB24 tablet Take 1 tablet (50 mg total) by mouth daily.  . Multiple Vitamin (MULTIVITAMIN WITH MINERALS) TABS tablet Take 1 tablet by mouth daily at 3 pm. Centrum  . OVER THE COUNTER MEDICATION Take 1 tablet by mouth at bedtime as needed (sleep). GNC PREVENTIVE NUTRITION TRI-SLEEP  . polyethylene glycol (MIRALAX / GLYCOLAX) packet Take 17 g by mouth 2 (two) times daily as needed (constipation).   . rosuvastatin (CRESTOR) 10 MG tablet Take 1 tablet (10 mg total) by mouth daily.  . sildenafil (REVATIO) 20 MG tablet Take up to 5 pills about 30 min prior to sex  . tretinoin (RETIN-A) 0.1 % cream Apply 1 application topically at bedtime.     Allergies:   Nickel    Social History   Socioeconomic History  . Marital status: Divorced    Spouse name: Not on file  . Number of children: Not on file  . Years of education: Not on file  . Highest education level: Bachelor's degree (e.g., BA, AB, BS)  Occupational History  . Occupation: Retired Designer, multimedia, Public affairs consultant / heat treating)  Social Needs  . Financial resource strain: Not hard at all  . Food insecurity    Worry: Never true    Inability: Never true  . Transportation needs    Medical: No    Non-medical: No  Tobacco Use  . Smoking status: Never Smoker  . Smokeless tobacco: Never Used  Substance and Sexual Activity  . Alcohol use: Yes    Alcohol/week: 7.0 standard drinks    Types: 1 Cans of beer, 6 Glasses of wine per week  . Drug use: No  . Sexual activity: Yes  Lifestyle  . Physical activity    Days per week: 3 days    Minutes per session: 30 min  . Stress: Not at all  Relationships  . Social connections    Talks on phone: More than three times a week    Gets together: More than three times a week    Attends religious service: More than 4 times per year    Active member of club or organization: No    Attends meetings of clubs or organizations: Never    Relationship status: Divorced  Other Topics Concern  . Not on file  Social History Narrative  . Not on file     Family History:  The patient's family history includes Alzheimer's disease in his sister; Lung cancer in his father; Pancreatic cancer in his brother; Thyroid disease in his mother. There is no history of Prostate cancer, Bladder Cancer, or Kidney cancer.  ROS:   Review of Systems  Constitutional: Positive for malaise/fatigue. Negative for chills, diaphoresis, fever and weight loss.  HENT: Negative for congestion.   Eyes: Negative for discharge and redness.  Respiratory: Negative for cough, hemoptysis, sputum production, shortness of breath and wheezing.   Cardiovascular: Positive for leg swelling. Negative for  chest pain, palpitations, orthopnea, claudication and PND.  Gastrointestinal: Negative for abdominal pain, blood in stool, heartburn, melena, nausea and vomiting.  Genitourinary: Negative for hematuria.  Musculoskeletal: Negative for falls and myalgias.  Skin: Negative for rash.  Neurological: Positive for dizziness, weakness and headaches. Negative for tingling, tremors, sensory change, speech change,  focal weakness and loss of consciousness.  Endo/Heme/Allergies: Does not bruise/bleed easily.  Psychiatric/Behavioral: Negative for substance abuse. The patient is nervous/anxious.   All other systems reviewed and are negative.    EKGs/Labs/Other Studies Reviewed:    Studies reviewed were summarized above. The additional studies were reviewed today: As above.  EKG:  EKG is ordered today.  The EKG ordered today demonstrates NSR, 67 bpm, first-degree AV block, RBBB, grossly unchanged from prior  Recent Labs: 09/09/2018: B Natriuretic Peptide 317.3 12/16/2018: Magnesium 2.1; TSH 2.066 01/23/2019: ALT 21; BUN 21; Creatinine, Ser 0.98; Hemoglobin 13.6; Platelets 141; Potassium 4.0; Sodium 138  Recent Lipid Panel    Component Value Date/Time   CHOL 146 11/24/2018 0813   CHOL 171 11/06/2013   TRIG 48 11/24/2018 0813   TRIG 45 11/06/2013   HDL 71 11/24/2018 0813   HDL 72 11/06/2013   CHOLHDL 2.1 11/24/2018 0813   VLDL 8 09/29/2016 0848   LDLCALC 61 11/24/2018 0813    PHYSICAL EXAM:    VS:  BP (!) 156/80 (BP Location: Left Arm, Patient Position: Sitting, Cuff Size: Normal)   Pulse 67   Ht 6\' 3"  (1.905 m)   Wt 193 lb (87.5 kg)   BMI 24.12 kg/m   BMI: Body mass index is 24.12 kg/m.  Physical Exam  Constitutional: He is oriented to person, place, and time. He appears well-developed and well-nourished.  HENT:  Head: Normocephalic and atraumatic.  Eyes: Right eye exhibits no discharge. Left eye exhibits no discharge.  Neck: Normal range of motion. No JVD present.  Cardiovascular:  Normal rate, regular rhythm, S1 normal, S2 normal and normal heart sounds. Exam reveals no distant heart sounds, no friction rub, no midsystolic click and no opening snap.  No murmur heard. Pulmonary/Chest: Effort normal and breath sounds normal. No respiratory distress. He has no decreased breath sounds. He has no wheezes. He has no rales. He exhibits no tenderness.  Abdominal: Soft. He exhibits no distension. There is no abdominal tenderness.  Musculoskeletal:        General: Edema present.     Comments: Trace bilateral pretibial edema  Neurological: He is alert and oriented to person, place, and time.  Skin: Skin is warm and dry. No cyanosis. Nails show no clubbing.  Psychiatric: He has a normal mood and affect. His speech is normal and behavior is normal. Judgment and thought content normal.    Wt Readings from Last 3 Encounters:  02/03/19 193 lb (87.5 kg)  01/23/19 201 lb 12.8 oz (91.5 kg)  01/20/19 190 lb (86.2 kg)     ASSESSMENT & PLAN:   1. Persistent atrial flutter: Status post successful cardioversion on 01/20/2019.  He is maintaining sinus rhythm.  Since being placed on diltiazem followed by digoxin the patient has noted multiple adverse effects with these medications.  Given the patient is maintaining sinus rhythm with predominantly bradycardic heart rates there is no current indication for digoxin.  This medicine will be discontinued today.  We will check a CMP and digoxin level nonetheless.  Discontinue diltiazem given bradycardic heart rates in the 50s to 60s bpm as well as associated headaches and lower extremity swelling.  For now, we will defer placing the patient on a beta-blocker given his bradycardic heart rates at home.  However, this may need to be revisited in follow-up.  Should he have recurrence of atrial flutter would recommend referral to EP for consideration of atrial flutter ablation.  This could also be revisited and follow-up.  Given his elevated CHADS2VASc of at  least 3 (HTN, age x 2) continue Eliquis.  He is interested in possibly transitioning to Xarelto in follow-up, particularly secondary to cost.  2. Aortic stenosis status post TAVR: Most recent echo showed normal LV systolic function and device function without any significant aortic insufficiency or perivalvular leak.  SBE prophylaxis.  Remains on Eliquis.  Follow-up with structural heart clinic as directed.  3. HTN: Blood pressure is suboptimally controlled at 156/80 today.  Stop diltiazem given intolerance as outlined above.  Start losartan 50 mg daily.  Patient indicates he was previously on losartan 100 mg daily for most of his adult life with blood pressure very well controlled from the low [id number] to [id number] systolic.  Will likely plan to escalate losartan to 100 mg and follow-up.  Will check CMP today and again in follow-up.  Has previously tolerated ARB without issues.  4. Carotid artery disease: Mild as noted above.  On Eliquis in place of aspirin.  Continue Crestor.  LDL of 61 from 11/2018.  5. Pulmonary nodules: Noted on CT of the chest earlier in 2020.  He will need follow-up CT chest in May 2021 to assess for stability.  6. Dizziness: Has previously been prescribed meclizine though patient does not report a history consistent with vertigo-like symptoms.  He attributes the dizziness to diltiazem and digoxin.  These medications were discontinued as outlined above.  Check digoxin level.  Recent carotid artery ultrasound unrevealing as outlined above.  Reassess in follow-up.  Check CBC given he is on anticoagulation.  Disposition: F/u with Dr. Rockey Situ or an APP in 1 month.   Medication Adjustments/Labs and Tests Ordered: Current medicines are reviewed at length with the patient today.  Concerns regarding medicines are outlined above. Medication changes, Labs and Tests ordered today are summarized above and listed in the Patient Instructions accessible in Encounters.   Signed, Christell Faith, PA-C  02/03/2019 2:20 PM     [facility] [address] [place] [address] [place], [place] [address] [phone]

## 2019-02-03 ENCOUNTER — Encounter: Payer: Self-pay | Admitting: Physician Assistant

## 2019-02-03 ENCOUNTER — Ambulatory Visit (INDEPENDENT_AMBULATORY_CARE_PROVIDER_SITE_OTHER): Payer: Medicare Other | Admitting: Physician Assistant

## 2019-02-03 ENCOUNTER — Other Ambulatory Visit: Payer: Self-pay

## 2019-02-03 VITALS — BP 156/80 | HR 67 | Ht 75.0 in | Wt 193.0 lb

## 2019-02-03 DIAGNOSIS — Z952 Presence of prosthetic heart valve: Secondary | ICD-10-CM

## 2019-02-03 DIAGNOSIS — I4891 Unspecified atrial fibrillation: Secondary | ICD-10-CM

## 2019-02-03 DIAGNOSIS — R918 Other nonspecific abnormal finding of lung field: Secondary | ICD-10-CM

## 2019-02-03 DIAGNOSIS — I350 Nonrheumatic aortic (valve) stenosis: Secondary | ICD-10-CM

## 2019-02-03 DIAGNOSIS — I779 Disorder of arteries and arterioles, unspecified: Secondary | ICD-10-CM | POA: Diagnosis not present

## 2019-02-03 DIAGNOSIS — R42 Dizziness and giddiness: Secondary | ICD-10-CM | POA: Diagnosis not present

## 2019-02-03 DIAGNOSIS — I10 Essential (primary) hypertension: Secondary | ICD-10-CM

## 2019-02-03 DIAGNOSIS — I4892 Unspecified atrial flutter: Secondary | ICD-10-CM | POA: Diagnosis not present

## 2019-02-03 MED ORDER — LOSARTAN POTASSIUM 50 MG PO TABS: 50.0000 mg | ORAL_TABLET | Freq: Every day | ORAL | 3 refills | Status: DC

## 2019-02-03 MED ORDER — APIXABAN 5 MG PO TABS: 5.0000 mg | ORAL_TABLET | Freq: Two times a day (BID) | ORAL | 11 refills | Status: DC

## 2019-02-03 NOTE — Patient Instructions (Signed)
Medication Instructions:  1- STOP Digoxin 2- STOP Diltiazem 3- START Losartan Take 1 tablet (50 mg total) by mouth daily *If you need a refill on your cardiac medications before your next appointment, please call your pharmacy*  Lab Work: Your physician recommends that you have lab work today(CBC, CMET, Dig level)  If you have labs (blood work) drawn today and your tests are completely normal, you will receive your results only by: Marland Kitchen MyChart Message (if you have MyChart) OR . A paper copy in the mail If you have any lab test that is abnormal or we need to change your treatment, we will call you to review the results.  Testing/Procedures: None ordered   Follow-Up: At [facility], you and your health needs are our priority.  As part of our continuing mission to provide you with exceptional heart care, we have created designated Provider Care Teams.  These Care Teams include your primary Cardiologist (physician) and Advanced Practice Providers (APPs -  Physician Assistants and Nurse Practitioners) who all work together to provide you with the care you need, when you need it.  Your next appointment:   1 month  The format for your next appointment:   In Person  Provider:    You may see Ida Rogue, MD or Christell Faith, PA-C.

## 2019-02-04 LAB — CBC
Hematocrit: 42.3 % (ref 37.5–51.0)
Hemoglobin: 14.4 g/dL (ref 13.0–17.7)
MCH: 32.1 pg (ref 26.6–33.0)
MCHC: 34 g/dL (ref 31.5–35.7)
MCV: 94 fL (ref 79–97)
Platelets: 157 10*3/uL (ref 150–450)
RBC: 4.49 x10E6/uL (ref 4.14–5.80)
RDW: 12.1 % (ref 11.6–15.4)
WBC: 4.9 10*3/uL (ref 3.4–10.8)

## 2019-02-04 LAB — COMPREHENSIVE METABOLIC PANEL
ALT: 23 IU/L (ref 0–44)
AST: 27 IU/L (ref 0–40)
Albumin/Globulin Ratio: 1.5 (ref 1.2–2.2)
Albumin: 4.5 g/dL (ref 3.7–4.7)
Alkaline Phosphatase: 110 IU/L (ref 39–117)
BUN/Creatinine Ratio: 18 (ref 10–24)
BUN: 17 mg/dL (ref 8–27)
Bilirubin Total: 0.9 mg/dL (ref 0.0–1.2)
CO2: 22 mmol/L (ref 20–29)
Calcium: 9.6 mg/dL (ref 8.6–10.2)
Chloride: 102 mmol/L (ref 96–106)
Creatinine, Ser: 0.95 mg/dL (ref 0.76–1.27)
GFR calc Af Amer: 89 mL/min/{1.73_m2} (ref >59)
GFR calc non Af Amer: 77 mL/min/{1.73_m2} (ref >59)
Globulin, Total: 3.1 g/dL (ref 1.5–4.5)
Glucose: 103 mg/dL — ABNORMAL HIGH (ref 65–99)
Potassium: 4.3 mmol/L (ref 3.5–5.2)
Sodium: 138 mmol/L (ref 134–144)
Total Protein: 7.6 g/dL (ref 6.0–8.5)

## 2019-02-04 LAB — DIGOXIN LEVEL: Digoxin, Serum: 0.5 ng/mL (ref 0.5–0.9)

## 2019-02-07 DIAGNOSIS — G63 Polyneuropathy in diseases classified elsewhere: Secondary | ICD-10-CM | POA: Diagnosis not present

## 2019-02-07 DIAGNOSIS — D472 Monoclonal gammopathy: Secondary | ICD-10-CM | POA: Diagnosis not present

## 2019-02-14 ENCOUNTER — Ambulatory Visit: Payer: Medicare Other | Admitting: Cardiovascular Disease

## 2019-02-16 ENCOUNTER — Telehealth: Payer: Self-pay | Admitting: Cardiovascular Disease

## 2019-02-16 MED ORDER — LOSARTAN POTASSIUM 100 MG PO TABS: 100.0000 mg | ORAL_TABLET | Freq: Every day | ORAL | 3 refills | Status: DC

## 2019-02-16 NOTE — Telephone Encounter (Signed)
I spoke with the patient. He is aware of James Faith, PA's recommendations to increase losartan to 100 mg once daily.  I have asked that he continue to record his BP readings and bring these with him to his next office visit on 03/06/19.  The patient voices understanding of the above and is agreeable.

## 2019-02-16 NOTE — Telephone Encounter (Signed)
Agree with request to titrate losartan to 100 mg daily.  Patient was previously on this dose for many years and did well.  In this setting, we have deferred follow-up BMP as he has previously tolerated this medication without issues.  Have patient go ahead and increase losartan to 100 mg daily and we will follow-up labs in BP at his visit.

## 2019-02-16 NOTE — Telephone Encounter (Signed)
Pt c/o medication issue:  1. Name of Medication: losartan   2. How are you currently taking this medication (dosage and times per day)? 50 MG 1 tablet daily   3. Are you having a reaction (difficulty breathing--STAT)? no  4. What is your medication issue? Patient calling, patient has been taking losartan 50 MG for about a week now.  States that he has seen no reduction in BP, sys stays at mid 130-140s and dys 79-88.  Patient would like to know if he should increase to 100 MG and try to see what results will be before his appt on 11/23.  Please call to discuss.

## 2019-02-16 NOTE — Telephone Encounter (Signed)
The patient last saw Christell Faith, PA on 10/23- will forward this message to Thurmond Butts to review.

## 2019-03-05 NOTE — Progress Notes (Deleted)
Cardiology Office Note    Date:  03/05/2019   ID:  James Cardenas, DOB 1940/04/25, MRN [id number]  PCP:  Olin Hauser, DO  Cardiologist:  Ida Rogue, MD  Electrophysiologist:  None   Chief Complaint: Follow up  History of Present Illness:   James Cardenas is a 78 y.o. male with history of nonobstructive CAD, aortic stenosis status post TAVR in 09/2018, atypical atrial flutter on Eliquis status post recent DCCV on 01/20/2019, mild carotid artery disease, pulmonary nodules, hypertension, hyperlipidemia, MGUS, and anxiety who presents for follow-up of atrial flutter and HTN.  Patient has a known history of aortic stenosis which was followed over the years and subsequently worked up for TAVR in the setting of worsening symptoms earlier in 2020.  Diagnostic cath in 07/2018, showed no significant CAD.  Carotid ultrasound showed mild carotid artery disease.  Pre-TAVR chest CT showed small pulmonary nodules.  Patient subsequently underwent successful TAVR in 09/2018 with significant improvement in exercise tolerance following procedure.  Follow-up echo in 10/2018 showed normal EF with normal device function without any significant aortic insufficiency or perivalvular leak.  On his last day of cardiac rehab he was noted to be tachycardic initially and remained tachycardic throughout rehab, though was asymptomatic.  In the setting of ongoing tachycardic rate he was sent to the ED where he was found to be in new onset atrial flutter with RVR.  He was placed on Eliquis and diltiazem with improved rate control and subsequently discharged.  He was seen in follow-up on 01/05/2019 indicating that initially he never took the diltiazem and continued on his home dose of losartan.  However when he began to notice tachycardic rates at home he did start diltiazem though this resulted in dizziness with associated hypotension.  Following this, it appears he spoke with someone through our office and was advised  to stop losartan and continue diltiazem as monotherapy with improvement in blood pressure with heart rates running in the 1 teens.  In this setting she notified our office again and was started on digoxin.  EKG at his visit on 01/05/2019 showed atrial flutter with RVR, 122 bpm, rare PVC, RBBB.  In early 01/2019 patient noted nonpositional dizziness while laying in bed.  He subsequently underwent successful cardioversion on 01/20/2019.  Patient contacted our office on 01/25/2019 noting a red circular rash in the middle of his chest as well as a 4 pound weight gain over the past week with bilateral swelling of his feet.  He reported his heart rates have been in the 50s to 60s bpm with BP in the 130s to 140s over 70s to 80s.  He reported a baseline weight around 190 to 191 pounds.  There was some associated dizziness and fatigue which he attributed to medications.  He reported some "spinning attacks" that he states began after starting diltiazem and were magnified after being placed on digoxin.  He was seen on 02/03/2019 noting continued dizziness, fatigue, headaches, and lower extremity swelling, which he attributed to diltiazem and digoxin.  It was advised he stop digoxin and diltiazem given bradycardic heart rates in the 50s bpm. He was started back on losartan with subsequent titration back to prior dosage of 100 mg daily over the phone.   ***   Labs: 01/2019 - Hgb 13.6, PLT 141, potassium 4.3, BUN 17, serum creatinine 0.95, albumin 4.5, AST/ALT normal, digoxin 0.5 12/2018 - TSH normal, magnesium 2.1 11/2018 - A1c 5.6, total cholesterol 146, HDL 71, triglycerides 48,  LDL 61  Past Medical History:  Diagnosis Date   Anxiety    Arthritis of knee    Atypical atrial flutter ([place])    a. Dx 12/15/2018. CHA2DS2VASc 3-->eliquis.   Carotid artery disease (HCC)    Constipation    due to Myrbetriq-     Hearing loss    wears hearing aids   Hypertension    Neuropathy    Nonobstructive Coronary  artery disease    a. 07/2018 Cath: LMN nl, LAD/LCX/RCA no significant dzs. EF 55-65%.   S/P TAVR (transcatheter aortic valve replacement)    a. 09/2018 TAVR: 26 mm Edwards Sapien THV via the TF approach; b. 10/2018 Echo: EF 55-60%. Nl RV fxn. RVSP 20.23mmHg. Mild BAE. No AI or perivalvular leak.   Severe aortic stenosis     Past Surgical History:  Procedure Laterality Date   CARDIOVERSION N/A 01/20/2019   Procedure: CARDIOVERSION;  Surgeon: Minna Merritts, MD;  Location: [facility];  Service: Cardiovascular;  Laterality: N/A;   COLONOSCOPY     EYE SURGERY Bilateral    cataract   HERNIA REPAIR Left    Inguinal   NECK SURGERY  2011   Cervical Fusion   RIGHT HEART CATH AND CORONARY ANGIOGRAPHY N/A 07/28/2018   Procedure: RIGHT HEART CATH AND CORONARY ANGIOGRAPHY;  Surgeon: Minna Merritts, MD;  Location: [place] [facility];  Service: Cardiovascular;  Laterality: N/A;   TEE WITHOUT CARDIOVERSION N/A 09/13/2018   Procedure: TRANSESOPHAGEAL ECHOCARDIOGRAM (TEE);  Surgeon: Sherren Mocha, MD;  Location: [place];  Service: Open Heart Surgery;  Laterality: N/A;   TRANSCATHETER AORTIC VALVE REPLACEMENT, TRANSFEMORAL  09/13/2018   TRANSCATHETER AORTIC VALVE REPLACEMENT, TRANSFEMORAL N/A 09/13/2018   Procedure: TRANSCATHETER AORTIC VALVE REPLACEMENT, TRANSFEMORAL;  Surgeon: Sherren Mocha, MD;  Location: [place];  Service: Open Heart Surgery;  Laterality: N/A;    Current Medications: No outpatient medications have been marked as taking for the 03/06/19 encounter (Appointment) with Rise Mu, PA-C.    Allergies:   Nickel   Social History   Socioeconomic History   Marital status: Divorced    Spouse name: Not on file   Number of children: Not on file   Years of education: Not on file   Highest education level: Bachelor's degree (e.g., BA, AB, BS)  Occupational History   Occupation: Retired Designer, multimedia, Public affairs consultant / heat treating)  Social Designer, fashion/clothing  strain: Not hard at all   Food insecurity    Worry: Never true    Inability: Never true   Transportation needs    Medical: No    Non-medical: No  Tobacco Use   Smoking status: Never Smoker   Smokeless tobacco: Never Used  Substance and Sexual Activity   Alcohol use: Yes    Alcohol/week: 7.0 standard drinks    Types: 1 Cans of beer, 6 Glasses of wine per week   Drug use: No   Sexual activity: Yes  Lifestyle   Physical activity    Days per week: 3 days    Minutes per session: 30 min   Stress: Not at all  Relationships   Social connections    Talks on phone: More than three times a week    Gets together: More than three times a week    Attends religious service: More than 4 times per year    Active member of club or organization: No    Attends meetings of clubs or organizations: Never    Relationship status: Divorced  Other Topics Concern   Not on file  Social History Narrative   Not on file     Family History:  The patient's family history includes Alzheimer's disease in his sister; Lung cancer in his father; Pancreatic cancer in his brother; Thyroid disease in his mother. There is no history of Prostate cancer, Bladder Cancer, or Kidney cancer.  ROS:   ROS   EKGs/Labs/Other Studies Reviewed:    Studies reviewed were summarized above. The additional studies were reviewed today: As above.   EKG:  EKG is ordered today.  The EKG ordered today demonstrates ***  Recent Labs: 09/09/2018: B Natriuretic Peptide 317.3 12/16/2018: Magnesium 2.1; TSH 2.066 02/03/2019: ALT 23; BUN 17; Creatinine, Ser 0.95; Hemoglobin 14.4; Platelets 157; Potassium 4.3; Sodium 138  Recent Lipid Panel    Component Value Date/Time   CHOL 146 11/24/2018 0813   CHOL 171 11/06/2013   TRIG 48 11/24/2018 0813   TRIG 45 11/06/2013   HDL 71 11/24/2018 0813   HDL 72 11/06/2013   CHOLHDL 2.1 11/24/2018 0813   VLDL 8 09/29/2016 0848   LDLCALC 61 11/24/2018 0813    PHYSICAL EXAM:     VS:  There were no vitals taken for this visit.  BMI: There is no height or weight on file to calculate BMI.  Physical Exam  Wt Readings from Last 3 Encounters:  02/03/19 193 lb (87.5 kg)  01/23/19 201 lb 12.8 oz (91.5 kg)  01/20/19 190 lb (86.2 kg)     ASSESSMENT & PLAN:   1. ***  Disposition: F/u with Dr. Rockey Situ or an APP in ***.   Medication Adjustments/Labs and Tests Ordered: Current medicines are reviewed at length with the patient today.  Concerns regarding medicines are outlined above. Medication changes, Labs and Tests ordered today are summarized above and listed in the Patient Instructions accessible in Encounters.   Signed, Christell Faith, PA-C 03/05/2019 9:54 AM     [facility] [address] [place], [place] [address] [phone]

## 2019-03-06 ENCOUNTER — Other Ambulatory Visit: Payer: Self-pay

## 2019-03-06 ENCOUNTER — Ambulatory Visit (INDEPENDENT_AMBULATORY_CARE_PROVIDER_SITE_OTHER): Payer: Medicare Other | Admitting: Cardiovascular Disease

## 2019-03-06 ENCOUNTER — Encounter: Payer: Self-pay | Admitting: Cardiovascular Disease

## 2019-03-06 ENCOUNTER — Ambulatory Visit: Payer: Medicare Other | Admitting: Physician Assistant

## 2019-03-06 VITALS — BP 138/74 | HR 67 | Ht 75.0 in | Wt 194.0 lb

## 2019-03-06 DIAGNOSIS — I779 Disorder of arteries and arterioles, unspecified: Secondary | ICD-10-CM

## 2019-03-06 DIAGNOSIS — I10 Essential (primary) hypertension: Secondary | ICD-10-CM | POA: Diagnosis not present

## 2019-03-06 DIAGNOSIS — R42 Dizziness and giddiness: Secondary | ICD-10-CM | POA: Diagnosis not present

## 2019-03-06 DIAGNOSIS — Z952 Presence of prosthetic heart valve: Secondary | ICD-10-CM | POA: Diagnosis not present

## 2019-03-06 DIAGNOSIS — I4892 Unspecified atrial flutter: Secondary | ICD-10-CM | POA: Diagnosis not present

## 2019-03-06 DIAGNOSIS — I350 Nonrheumatic aortic (valve) stenosis: Secondary | ICD-10-CM

## 2019-03-06 NOTE — Progress Notes (Signed)
Evaluation Performed:  Follow-up visit  Date:  03/06/2019   ID:  James Cardenas, DOB 22-Jun-1940, MRN [id number]  Patient Location:  [address] [place] [id number]   Provider location:   [facility], [place] office  PCP:  Olin Hauser, DO  Cardiologist:  [facility]  Chief Complaint  Patient presents with  . other    1 month f/u pt has some questions about lifestyle. Meds reviewed verbally with pt.    History of Present Illness:    James Cardenas is a 78 y.o. male who presents via audio/video conferencing for a telehealth visit today.   The patient does not symptoms concerning for COVID-19 infection (fever, chills, cough, or new SHORTNESS OF BREATH).   Patient has a past medical history of Aortic valve stenosis , severe Anxiety GERD Essential hypertension Overactive bladder Peripheral neuropathy MGUS (monoclonal gammopathy of unknown significance) Who presents for follow-up of his severe aortic valve stenosis, TAVR, ,atrial flutter  successful TAVR in 09/2018  On his last day of cardiac rehab noted to be in new onset atrial flutter with RVR  underwent successful cardioversion on 01/20/2019.  01/25/2019 noting a red circular rash in the middle of his chest as well as a 4 pound weight gain over the past week with bilateral swelling of his feet  "spinning attacks"   starting diltiazem and were magnified after being placed on digoxin Digoxin and diltiazem was held Started losartan 50, BP was elevated Increased losartan up to 100 daily BP at home 120, HR high 50 to 60s  elevated CHADS2VASc of at least 3 (HTN, age x 2) continue Eliquis.  denies any arrhythmia  Afraid to do exercise, anxiety concerning eliquis Has been doing some reading on Eliquis and very nervous about taking it.  Reports having problems with depression in the past, his anxiety is much worse concerning the risk of being on a blood thinner Rare  dizziness, rarely takes meclizine  EKG personally reviewed by myself on todays visit Shows normal sinus rhythm with rate 67 bpm right bundle branch block  Other past medical history reviewed Cardiac catheterization July 28, 2018 Left ventriculography: Left ventricular systolic function is normal, LVEF is estimated at 55-65%, there is no significant mitral regurgitation , no significant aortic valve stenosis  No significant coronary artery disease Unable to cross aortic valve for pressures or LV function Grossly normal right heart pressures  Echocardiogram 2015 performed through [facility] Moderate aortic valve stenosis peak gradient 44 mmHg, moderate aortic valve regurgitation, mild MR Normal ejection fraction EF greater than 55%   Past Medical History:  Diagnosis Date  . Anxiety   . Arthritis of knee   . Atypical atrial flutter ([place])    a. Dx 12/15/2018. CHA2DS2VASc 3-->eliquis.  . Carotid artery disease ([place])   . 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 Hearing loss    wears hearing aids  . Hypertension   . Neuropathy   . Nonobstructive Coronary artery disease    a. 07/2018 Cath: LMN nl, LAD/LCX/RCA no significant dzs. EF 55-65%.  . S/P TAVR (transcatheter aortic valve replacement)    a. 09/2018 TAVR: 26 mm Edwards Sapien THV via the TF approach; b. 10/2018 Echo: EF 55-60%. Nl RV fxn. RVSP 20.39mmHg. Mild BAE. No AI or perivalvular leak.  . Severe aortic stenosis    Past Surgical History:  Procedure Laterality Date  . CARDIOVERSION N/A 01/20/2019   Procedure:  CARDIOVERSION;  Surgeon: Minna Merritts, MD;  Location: [facility];  Service: Cardiovascular;  Laterality: N/A;  . COLONOSCOPY    . EYE SURGERY Bilateral    cataract  . HERNIA REPAIR Left    Inguinal  . NECK SURGERY  2011   Cervical Fusion  . RIGHT HEART CATH AND CORONARY ANGIOGRAPHY N/A 07/28/2018   Procedure: RIGHT HEART CATH AND CORONARY ANGIOGRAPHY;  Surgeon: Minna Merritts, MD;  Location: [place] [facility];   Service: Cardiovascular;  Laterality: N/A;  . TEE WITHOUT CARDIOVERSION N/A 09/13/2018   Procedure: TRANSESOPHAGEAL ECHOCARDIOGRAM (TEE);  Surgeon: Sherren Mocha, MD;  Location: [place];  Service: Open Heart Surgery;  Laterality: N/A;  . TRANSCATHETER AORTIC VALVE REPLACEMENT, TRANSFEMORAL  09/13/2018  . TRANSCATHETER AORTIC VALVE REPLACEMENT, TRANSFEMORAL N/A 09/13/2018   Procedure: TRANSCATHETER AORTIC VALVE REPLACEMENT, TRANSFEMORAL;  Surgeon: Sherren Mocha, MD;  Location: [place];  Service: Open Heart Surgery;  Laterality: N/A;     Current Meds  Medication Sig  . Alpha-Lipoic Acid 600 MG CAPS Take 600 mg by mouth every morning.   Marland Kitchen ALPRAZolam (XANAX) 0.5 MG tablet Take 1 tablet (0.5 mg total) by mouth 2 (two) times daily as needed for anxiety.  Marland Kitchen amoxicillin (AMOXIL) 500 MG tablet Take 2,000 mg (4 pills) one hour prior to all dental visits. There are enough for two visits in this bottle.  Marland Kitchen apixaban (ELIQUIS) 5 MG TABS tablet Take 1 tablet (5 mg total) by mouth 2 (two) times daily.  . Azelaic Acid 15 % cream Apply 1 application topically daily. After skin is thoroughly washed and patted dry, gently but thoroughly massage a thin film of azelaic acid cream into the affected area once daily  . Carboxymethylcellul-Glycerin (LUBRICATING EYE DROPS OP) Place 1 drop into both eyes 2 (two) times a day.   . cholecalciferol (VITAMIN D3) 25 MCG (1000 UT) tablet Take 1,000 Units by mouth daily.  Marland Kitchen losartan (COZAAR) 100 MG tablet Take 1 tablet (100 mg total) by mouth daily.  . meclizine (ANTIVERT) 25 MG tablet Take 1 tablet (25 mg total) by mouth 3 (three) times daily as needed for dizziness.  . mirabegron ER (MYRBETRIQ) 50 MG TB24 tablet Take 1 tablet (50 mg total) by mouth daily.  . Multiple Vitamin (MULTIVITAMIN WITH MINERALS) TABS tablet Take 1 tablet by mouth daily at 3 pm. Centrum  . OVER THE COUNTER MEDICATION Take 1 tablet by mouth at bedtime as needed (sleep). GNC PREVENTIVE NUTRITION TRI-SLEEP  .  polyethylene glycol (MIRALAX / GLYCOLAX) packet Take 17 g by mouth 2 (two) times daily as needed (constipation).   . rosuvastatin (CRESTOR) 10 MG tablet Take 1 tablet (10 mg total) by mouth daily.  . sildenafil (REVATIO) 20 MG tablet Take up to 5 pills about 30 min prior to sex  . tretinoin (RETIN-A) 0.1 % cream Apply 1 application topically at bedtime.     Allergies:   Nickel   Social History   Tobacco Use  . Smoking status: Never Smoker  . Smokeless tobacco: Never Used  Substance Use Topics  . Alcohol use: Yes    Alcohol/week: 7.0 standard drinks    Types: 1 Cans of beer, 6 Glasses of wine per week  . Drug use: No     Current Outpatient Medications on File Prior to Visit  Medication Sig Dispense Refill  . Alpha-Lipoic Acid 600 MG CAPS Take 600 mg by mouth every morning.     Marland Kitchen ALPRAZolam (XANAX) 0.5 MG tablet Take 1 tablet (  0.5 mg total) by mouth 2 (two) times daily as needed for anxiety. 60 tablet 2  . amoxicillin (AMOXIL) 500 MG tablet Take 2,000 mg (4 pills) one hour prior to all dental visits. There are enough for two visits in this bottle. 8 tablet 6  . apixaban (ELIQUIS) 5 MG TABS tablet Take 1 tablet (5 mg total) by mouth 2 (two) times daily. 60 tablet 11  . Azelaic Acid 15 % cream Apply 1 application topically daily. After skin is thoroughly washed and patted dry, gently but thoroughly massage a thin film of azelaic acid cream into the affected area once daily    . Carboxymethylcellul-Glycerin (LUBRICATING EYE DROPS OP) Place 1 drop into both eyes 2 (two) times a day.     . cholecalciferol (VITAMIN D3) 25 MCG (1000 UT) tablet Take 1,000 Units by mouth daily.    Marland Kitchen losartan (COZAAR) 100 MG tablet Take 1 tablet (100 mg total) by mouth daily. 90 tablet 3  . meclizine (ANTIVERT) 25 MG tablet Take 1 tablet (25 mg total) by mouth 3 (three) times daily as needed for dizziness. 30 tablet 1  . mirabegron ER (MYRBETRIQ) 50 MG TB24 tablet Take 1 tablet (50 mg total) by mouth daily. 90  tablet 3  . Multiple Vitamin (MULTIVITAMIN WITH MINERALS) TABS tablet Take 1 tablet by mouth daily at 3 pm. Centrum    . OVER THE COUNTER MEDICATION Take 1 tablet by mouth at bedtime as needed (sleep). GNC PREVENTIVE NUTRITION TRI-SLEEP    . polyethylene glycol (MIRALAX / GLYCOLAX) packet Take 17 g by mouth 2 (two) times daily as needed (constipation).     . rosuvastatin (CRESTOR) 10 MG tablet Take 1 tablet (10 mg total) by mouth daily. 90 tablet 3  . sildenafil (REVATIO) 20 MG tablet Take up to 5 pills about 30 min prior to sex 50 tablet 8  . tretinoin (RETIN-A) 0.1 % cream Apply 1 application topically at bedtime.   0   No current facility-administered medications on file prior to visit.      Family Hx: The patient's family history includes Alzheimer's disease in his sister; Lung cancer in his father; Pancreatic cancer in his brother; Thyroid disease in his mother. There is no history of Prostate cancer, Bladder Cancer, or Kidney cancer.  ROS:   Please see the history of present illness.    Review of Systems  Constitutional: Negative.   HENT: Negative.   Respiratory: Negative.   Cardiovascular: Negative.   Gastrointestinal: Negative.   Musculoskeletal: Negative.   Neurological: Negative.   Psychiatric/Behavioral: The patient is nervous/anxious.   All other systems reviewed and are negative.   Labs/Other Tests and Data Reviewed:    Recent Labs: 09/09/2018: B Natriuretic Peptide 317.3 12/16/2018: Magnesium 2.1; TSH 2.066 02/03/2019: ALT 23; BUN 17; Creatinine, Ser 0.95; Hemoglobin 14.4; Platelets 157; Potassium 4.3; Sodium 138   Recent Lipid Panel Lab Results  Component Value Date/Time   CHOL 146 11/24/2018 08:13 AM   CHOL 171 11/06/2013   TRIG 48 11/24/2018 08:13 AM   TRIG 45 11/06/2013   HDL 71 11/24/2018 08:13 AM   HDL 72 11/06/2013   CHOLHDL 2.1 11/24/2018 08:13 AM   LDLCALC 61 11/24/2018 08:13 AM    Wt Readings from Last 3 Encounters:  03/06/19 194 lb (88 kg)   02/03/19 193 lb (87.5 kg)  01/23/19 201 lb 12.8 oz (91.5 kg)     Exam:    Vital Signs: Vital signs may also be detailed in the HPI  BP 138/74 (BP Location: Left Arm, Patient Position: Sitting, Cuff Size: Normal)   Pulse 67   Ht 6\' 3"  (1.905 m)   Wt 194 lb (88 kg)   SpO2 98%   BMI 24.25 kg/m    Constitutional:  oriented to person, place, and time. No distress.  HENT:  Head: Grossly normal Eyes:  no discharge. No scleral icterus.  Neck: No JVD, no carotid bruits  Cardiovascular: Regular rate and rhythm, no murmurs appreciated Pulmonary/Chest: Clear to auscultation bilaterally, no wheezes or rails Abdominal: Soft.  no distension.  no tenderness.  Musculoskeletal: Normal range of motion Neurological:  normal muscle tone. Coordination normal. No atrophy Skin: Skin warm and dry Psychiatric: normal affect, pleasant   ASSESSMENT & PLAN:    Severe aortic stenosis Successful TAVR,  Recovered well, long discussion concerning anticoagulation options When he runs out of Eliquis he will go back on aspirin  Atrial flutter Following surgery, converted to normal sinus rhythm after cardioversion Has completed at least 1 month of Eliquis Does not want to stay on it for cost reasons, causing severe anxiety, Feels it is affecting everything his energy, desire to work out,Afraid to do anything.  Recommend he stop the Eliquis when prescription runs out this month and start low-dose aspirin Discussed various options to monitor heart rate such as buying a apple watch that has the arrhythmia software  Anxiety Recommend he restart his exercise program Support /reassurance provided  Essential hypertension Blood pressure is well controlled on today's visit. No changes made to the medications.  MGUS (monoclonal gammopathy of unknown significance) Managed by primary care  Long discussion concerning atrial flutter, risk and benefit of anticoagulation, various treatment options, modalities for  monitoring heart rhythm  Total encounter time more than 45 minutes  Greater than 50% was spent in counseling and coordination of care with the patient   Disposition: Follow-up in 6 months   Signed, Ida Rogue, MD  03/06/2019 12:38 PM    [place] Office [address], [place], [place] [address]

## 2019-03-06 NOTE — Patient Instructions (Addendum)
Think about buying an apple watch to monitor for arrhythmia  Medication Instructions:  Ok to hold eliquis when script runs out Change back to asa  If you need a refill on your cardiac medications before your next appointment, please call your pharmacy.    Lab work: No new labs needed   If you have labs (blood work) drawn today and your tests are completely normal, you will receive your results only by: Marland Kitchen MyChart Message (if you have MyChart) OR . A paper copy in the mail If you have any lab test that is abnormal or we need to change your treatment, we will call you to review the results.   Testing/Procedures: No new testing needed   Follow-Up: At [facility], you and your health needs are our priority.  As part of our continuing mission to provide you with exceptional heart care, we have created designated Provider Care Teams.  These Care Teams include your primary Cardiologist (physician) and Advanced Practice Providers (APPs -  Physician Assistants and Nurse Practitioners) who all work together to provide you with the care you need, when you need it.  . You will need a follow up appointment in 6 months . Virtual ok  . Providers on your designated Care Team:   . Murray Hodgkins, NP . Christell Faith, PA-C . Marrianne Mood, PA-C  Any Other Special Instructions Will Be Listed Below (If Applicable).  For educational health videos Log in to : [email or web address] Or : [email or web address], password : [email or web address]

## 2019-03-15 NOTE — Addendum Note (Signed)
Addended by: Janan Ridge on: 03/15/2019 10:42 AM   Modules accepted: Orders

## 2019-03-16 ENCOUNTER — Encounter: Payer: Self-pay | Admitting: Podiatry

## 2019-03-16 ENCOUNTER — Other Ambulatory Visit: Payer: Self-pay

## 2019-03-16 ENCOUNTER — Ambulatory Visit (INDEPENDENT_AMBULATORY_CARE_PROVIDER_SITE_OTHER): Payer: Medicare Other | Admitting: Podiatry

## 2019-03-16 DIAGNOSIS — M79676 Pain in unspecified toe(s): Secondary | ICD-10-CM | POA: Diagnosis not present

## 2019-03-16 DIAGNOSIS — M2042 Other hammer toe(s) (acquired), left foot: Secondary | ICD-10-CM | POA: Diagnosis not present

## 2019-03-16 DIAGNOSIS — M2041 Other hammer toe(s) (acquired), right foot: Secondary | ICD-10-CM | POA: Diagnosis not present

## 2019-03-16 DIAGNOSIS — B351 Tinea unguium: Secondary | ICD-10-CM | POA: Diagnosis not present

## 2019-03-16 NOTE — Progress Notes (Signed)
Complaint:  Visit Type: Patient returns to my office for continued preventative foot care services. Complaint: Patient states" my nails have grown long and thick and become painful to walk and wear shoes.   The patient presents for preventative foot care services. No changes to ROS  Podiatric Exam: Vascular: dorsalis pedis and posterior tibial pulses are palpable bilateral. Capillary return is immediate. Temperature gradient is WNL. Skin turgor WNL  Sensorium: Normal Semmes Weinstein monofilament test. Normal tactile sensation bilaterally. Nail Exam: Pt has thick disfigured discolored nails with subungual debris noted bilateral entire nail hallux through fifth toenails Ulcer Exam: There is no evidence of ulcer or pre-ulcerative changes or infection. Orthopedic Exam: Muscle tone and strength are WNL. No limitations in general ROM. No crepitus or effusions noted. Foot type and digits show no abnormalities. Hallux malleus  B/L  Hammer toes  B/l. Skin: No Porokeratosis. No infection or ulcers.    Diagnosis:  Onychomycosis, , Pain in right toe, pain in left toes  Treatment & Plan Procedures and Treatment: Consent by patient was obtained for treatment procedures.   Debridement of mycotic and hypertrophic toenails, 1 through 5 bilateral and clearing of subungual debris. No ulceration, no infection noted.   Return Visit-Office Procedure: Patient instructed to return to the office for a follow up visit 10 weeks  for continued evaluation and treatment.    Gardiner Barefoot DPM

## 2019-03-20 ENCOUNTER — Telehealth: Payer: Self-pay | Admitting: Cardiovascular Disease

## 2019-03-20 NOTE — Telephone Encounter (Signed)
Pt c/o medication issue:  1. Name of Medication: ASA   2. How are you currently taking this medication (dosage and times per day)?  Please advise  81 vs 325 mg po q HS vs d   3. Are you having a reaction (difficulty breathing--STAT)?  No   4. What is your medication issue?  Please advise med dose and time

## 2019-03-21 MED ORDER — ASPIRIN EC 81 MG PO TBEC: 81.0000 mg | DELAYED_RELEASE_TABLET | Freq: Every day | ORAL | 3 refills | Status: DC

## 2019-03-21 NOTE — Telephone Encounter (Signed)
Spoke with patient and he wanted to clarify the dosage of aspirin to start now that he is done with his Eliquis. Advised it was low dose Aspirin 81 mg once daily. He verbalized understanding with no further questions at this time. He did want to push his appointment out but will call back tomorrow to discuss with scheduling. He was appreciative for the call with no further questions at this time.

## 2019-04-19 DIAGNOSIS — C4442 Squamous cell carcinoma of skin of scalp and neck: Secondary | ICD-10-CM | POA: Diagnosis not present

## 2019-05-05 ENCOUNTER — Ambulatory Visit: Payer: Medicare Other

## 2019-05-18 ENCOUNTER — Other Ambulatory Visit: Payer: Self-pay

## 2019-05-18 ENCOUNTER — Ambulatory Visit (INDEPENDENT_AMBULATORY_CARE_PROVIDER_SITE_OTHER): Payer: Medicare Other | Admitting: Podiatry

## 2019-05-18 ENCOUNTER — Encounter: Payer: Self-pay | Admitting: Podiatry

## 2019-05-18 DIAGNOSIS — G629 Polyneuropathy, unspecified: Secondary | ICD-10-CM

## 2019-05-18 DIAGNOSIS — B351 Tinea unguium: Secondary | ICD-10-CM | POA: Diagnosis not present

## 2019-05-18 DIAGNOSIS — M79676 Pain in unspecified toe(s): Secondary | ICD-10-CM

## 2019-05-18 NOTE — Progress Notes (Signed)
Complaint:  Visit Type: Patient returns to my office for continued preventative foot care services. Complaint: Patient states" my nails have grown long and thick and become painful to walk and wear shoes.   The patient presents for preventative foot care services. No changes to ROS  Podiatric Exam: Vascular: dorsalis pedis and posterior tibial pulses are palpable bilateral. Capillary return is immediate. Temperature gradient is WNL. Skin turgor WNL  Sensorium: Normal Semmes Weinstein monofilament test. Normal tactile sensation bilaterally. Nail Exam: Pt has thick disfigured discolored nails with subungual debris noted bilateral entire nail hallux through fifth toenails Ulcer Exam: There is no evidence of ulcer or pre-ulcerative changes or infection. Orthopedic Exam: Muscle tone and strength are WNL. No limitations in general ROM. No crepitus or effusions noted. Foot type and digits show no abnormalities. Hallux malleus  B/L  Hammer toes  B/l. Skin: No Porokeratosis. No infection or ulcers.    Diagnosis:  Onychomycosis, , Pain in right toe, pain in left toes  Treatment & Plan Procedures and Treatment: Consent by patient was obtained for treatment procedures.   Debridement of mycotic and hypertrophic toenails, 1 through 5 bilateral and clearing of subungual debris. No ulceration, no infection noted.   Return Visit-Office Procedure: Patient instructed to return to the office for a follow up visit 10 weeks  for continued evaluation and treatment.    Gardiner Barefoot DPM

## 2019-05-23 DIAGNOSIS — C4442 Squamous cell carcinoma of skin of scalp and neck: Secondary | ICD-10-CM | POA: Diagnosis not present

## 2019-05-24 ENCOUNTER — Encounter: Payer: Self-pay | Admitting: Urology

## 2019-05-24 ENCOUNTER — Other Ambulatory Visit: Payer: Self-pay

## 2019-05-24 ENCOUNTER — Ambulatory Visit (INDEPENDENT_AMBULATORY_CARE_PROVIDER_SITE_OTHER): Payer: Medicare Other | Admitting: Urology

## 2019-05-24 VITALS — BP 149/81 | HR 77 | Ht 75.0 in | Wt 192.0 lb

## 2019-05-24 DIAGNOSIS — R350 Frequency of micturition: Secondary | ICD-10-CM | POA: Diagnosis not present

## 2019-05-24 DIAGNOSIS — N401 Enlarged prostate with lower urinary tract symptoms: Secondary | ICD-10-CM

## 2019-05-24 NOTE — Progress Notes (Signed)
05/24/2019 11:34 AM   James Cardenas Feb 14, 1941 [id number]  Referring provider: Olin Hauser, DO [address] [place],  [place] [address]  Chief Complaint  Patient presents with  . Over Active Bladder    Urologic history: 1.  BPH lower urinary tract symptoms -Initially seen by Dr. Pilar Jarvis in 2017 -Primarily storage related voiding symptoms -Was on tamsulosin without improvement  -Started Myrbetriq 25 mg 2017 with marked improvement -Tamsulosin discontinued 2018 -Myrbetriq increased 50 mg 2019  2.  Erectile dysfunction -Generic sildenafil   HPI: 79 y.o. male presents for annual follow-up.  Overall he feels he is doing okay.  He denies urinary hesitancy and has a good stream.  He generally sleeps 6 hours nightly and has nocturia 1-3 times.  He will occasionally have urgency with mild leakage prior to voiding.  He also notes urge to void when changing from sitting/supine to standing.  He also notes occasional urge to have a bowel movement and when attempting will void a small amount with resolution of this sensation.  Denies dysuria or gross hematuria.  No flank, abdominal or pelvic pain.  His Myrbetriq co-pay is >$100 per month and he was inquiring about possible alternative medications which may be less expensive.   PMH: Past Medical History:  Diagnosis Date  . Anxiety   . Arthritis of knee   . Atypical atrial flutter ([place])    a. Dx 12/15/2018. CHA2DS2VASc 3-->eliquis.  . Carotid artery disease ([place])   . 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 Hearing loss    wears hearing aids  . Hypertension   . Neuropathy   . Nonobstructive Coronary artery disease    a. 07/2018 Cath: LMN nl, LAD/LCX/RCA no significant dzs. EF 55-65%.  . S/P TAVR (transcatheter aortic valve replacement)    a. 09/2018 TAVR: 26 mm Edwards Sapien THV via the TF approach; b. 10/2018 Echo: EF 55-60%. Nl RV fxn. RVSP 20.16mmHg. Mild BAE. No AI or perivalvular leak.  . Severe aortic stenosis      Surgical History: Past Surgical History:  Procedure Laterality Date  . CARDIOVERSION N/A 01/20/2019   Procedure: CARDIOVERSION;  Surgeon: Minna Merritts, MD;  Location: [facility];  Service: Cardiovascular;  Laterality: N/A;  . COLONOSCOPY    . EYE SURGERY Bilateral    cataract  . HERNIA REPAIR Left    Inguinal  . NECK SURGERY  2011   Cervical Fusion  . RIGHT HEART CATH AND CORONARY ANGIOGRAPHY N/A 07/28/2018   Procedure: RIGHT HEART CATH AND CORONARY ANGIOGRAPHY;  Surgeon: Minna Merritts, MD;  Location: [place] [place] [facility];  Service: Cardiovascular;  Laterality: N/A;  . TEE WITHOUT CARDIOVERSION N/A 09/13/2018   Procedure: TRANSESOPHAGEAL ECHOCARDIOGRAM (TEE);  Surgeon: Sherren Mocha, MD;  Location: [facility];  Service: Open Heart Surgery;  Laterality: N/A;  . TRANSCATHETER AORTIC VALVE REPLACEMENT, TRANSFEMORAL  09/13/2018  . TRANSCATHETER AORTIC VALVE REPLACEMENT, TRANSFEMORAL N/A 09/13/2018   Procedure: TRANSCATHETER AORTIC VALVE REPLACEMENT, TRANSFEMORAL;  Surgeon: Sherren Mocha, MD;  Location: [facility];  Service: Open Heart Surgery;  Laterality: N/A;    Home Medications:  Allergies as of 05/24/2019      Reactions   Nickel Itching      Medication List       Accurate as of May 24, 2019 11:34 AM. If you have any questions, ask your nurse or doctor.        Alpha-Lipoic Acid 600 MG Caps Take 600 mg by mouth every morning.  ALPRAZolam 0.5 MG tablet Commonly known as: XANAX Take 1 tablet (0.5 mg total) by mouth 2 (two) times daily as needed for anxiety.   amoxicillin 500 MG tablet Commonly known as: AMOXIL Take 2,000 mg (4 pills) one hour prior to all dental visits. There are enough for two visits in this bottle.   aspirin EC 81 MG tablet Take 1 tablet (81 mg total) by mouth daily.   Azelaic Acid 15 % cream Apply 1 application topically daily. After skin is thoroughly washed and patted dry, gently but thoroughly massage a thin film of azelaic acid cream into the  affected area once daily   cholecalciferol 25 MCG (1000 UNIT) tablet Commonly known as: VITAMIN D3 Take 1,000 Units by mouth daily.   losartan 50 MG tablet Commonly known as: COZAAR Take 50 mg by mouth daily.   LUBRICATING EYE DROPS OP Place 1 drop into both eyes 2 (two) times a day.   meclizine 25 MG tablet Commonly known as: ANTIVERT Take 1 tablet (25 mg total) by mouth 3 (three) times daily as needed for dizziness.   mirabegron ER 50 MG Tb24 tablet Commonly known as: MYRBETRIQ Take 1 tablet (50 mg total) by mouth daily.   multivitamin with minerals Tabs tablet Take 1 tablet by mouth daily at 3 pm. Centrum   OVER THE COUNTER MEDICATION Take 1 tablet by mouth at bedtime as needed (sleep). GNC PREVENTIVE NUTRITION TRI-SLEEP   polyethylene glycol 17 g packet Commonly known as: MIRALAX / GLYCOLAX Take 17 g by mouth 2 (two) times daily as needed (constipation).   rosuvastatin 10 MG tablet Commonly known as: CRESTOR Take 1 tablet (10 mg total) by mouth daily.   sildenafil 20 MG tablet Commonly known as: REVATIO Take up to 5 pills about 30 min prior to sex   tretinoin 0.1 % cream Commonly known as: RETIN-A Apply 1 application topically at bedtime.   triamcinolone cream 0.1 % Commonly known as: KENALOG APPLY TWICE DAILY TO AFFECTED RASH AS NEEDED       Allergies:  Allergies  Allergen Reactions  . Nickel Itching    Family History: Family History  Problem Relation Age of Onset  . Thyroid disease Mother   . Lung cancer Father   . Pancreatic cancer Brother   . Alzheimer's disease Sister   . Prostate cancer Neg Hx   . Bladder Cancer Neg Hx   . Kidney cancer Neg Hx     Social History:  reports that he has never smoked. He has never used smokeless tobacco. He reports current alcohol use of about 7.0 standard drinks of alcohol per week. He reports that he does not use drugs.  ROS: UROLOGY Frequent Urination?: No Hard to postpone urination?: No Burning/pain  with urination?: No Get up at night to urinate?: No Leakage of urine?: No Urine stream starts and stops?: No Trouble starting stream?: No Do you have to strain to urinate?: No Blood in urine?: No Urinary tract infection?: No Sexually transmitted disease?: No Injury to kidneys or bladder?: No Painful intercourse?: No Weak stream?: No Erection problems?: No Penile pain?: No  Gastrointestinal Nausea?: No Vomiting?: No Indigestion/heartburn?: No Diarrhea?: No Constipation?: No  Constitutional Fever: No Night sweats?: No Weight loss?: No Fatigue?: No  Skin Skin rash/lesions?: No Itching?: No  Eyes Blurred vision?: No Double vision?: No  Ears/Nose/Throat Sore throat?: No Sinus problems?: No  Hematologic/Lymphatic Swollen glands?: No Easy bruising?: No  Cardiovascular Leg swelling?: No Chest pain?: No  Respiratory Cough?: No  Shortness of breath?: No  Endocrine Excessive thirst?: No  Musculoskeletal Back pain?: No Joint pain?: No  Neurological Headaches?: No Dizziness?: No  Psychologic Depression?: No Anxiety?: No  Physical Exam: BP (!) 149/81   Pulse 77   Ht 6\' 3"  (1.905 m)   Wt 192 lb (87.1 kg)   BMI 24.00 kg/m   Constitutional:  Alert and oriented, No acute distress. HEENT: [place] AT, moist mucus membranes.  Trachea midline, no masses. Cardiovascular: No clubbing, cyanosis, or edema. Respiratory: Normal respiratory effort, no increased work of breathing. GI: Abdomen is soft, nontender, nondistended, no abdominal masses Skin: No rashes, bruises or suspicious lesions. Neurologic: Grossly intact, no focal deficits, moving all 4 extremities. Psychiatric: Normal mood and affect.   Assessment & Plan:    -BPH with lower urinary tract symptoms His voiding symptoms are primarily storage related.  I discussed the alternative of anticholinergic medications however they do have higher side effects of dry mouth and constipation in the potential of  urinary retention.  Adding a more selective alpha blocker was discussed however he had no improvement on tamsulosin and the potential side effects of dizziness were discussed.  He will stay on the Myrbetriq and refill was sent to pharmacy.   Abbie Sons, [place] [address], [place], [place] [address] [phone]

## 2019-05-26 ENCOUNTER — Encounter: Payer: Self-pay | Admitting: Urology

## 2019-05-27 ENCOUNTER — Ambulatory Visit: Payer: Medicare Other

## 2019-06-20 DIAGNOSIS — X32XXXA Exposure to sunlight, initial encounter: Secondary | ICD-10-CM | POA: Diagnosis not present

## 2019-06-20 DIAGNOSIS — D2271 Melanocytic nevi of right lower limb, including hip: Secondary | ICD-10-CM | POA: Diagnosis not present

## 2019-06-20 DIAGNOSIS — D485 Neoplasm of uncertain behavior of skin: Secondary | ICD-10-CM | POA: Diagnosis not present

## 2019-06-20 DIAGNOSIS — D2262 Melanocytic nevi of left upper limb, including shoulder: Secondary | ICD-10-CM | POA: Diagnosis not present

## 2019-06-20 DIAGNOSIS — L570 Actinic keratosis: Secondary | ICD-10-CM | POA: Diagnosis not present

## 2019-06-20 DIAGNOSIS — L821 Other seborrheic keratosis: Secondary | ICD-10-CM | POA: Diagnosis not present

## 2019-06-20 DIAGNOSIS — D2261 Melanocytic nevi of right upper limb, including shoulder: Secondary | ICD-10-CM | POA: Diagnosis not present

## 2019-06-20 DIAGNOSIS — D0359 Melanoma in situ of other part of trunk: Secondary | ICD-10-CM | POA: Diagnosis not present

## 2019-06-20 DIAGNOSIS — Z85828 Personal history of other malignant neoplasm of skin: Secondary | ICD-10-CM | POA: Diagnosis not present

## 2019-06-21 DIAGNOSIS — H26493 Other secondary cataract, bilateral: Secondary | ICD-10-CM | POA: Diagnosis not present

## 2019-07-05 DIAGNOSIS — D0359 Melanoma in situ of other part of trunk: Secondary | ICD-10-CM | POA: Diagnosis not present

## 2019-07-10 ENCOUNTER — Other Ambulatory Visit: Payer: Self-pay | Admitting: Family Medicine

## 2019-07-10 DIAGNOSIS — F418 Other specified anxiety disorders: Secondary | ICD-10-CM

## 2019-07-24 ENCOUNTER — Inpatient Hospital Stay: Payer: Medicare Other

## 2019-07-24 ENCOUNTER — Inpatient Hospital Stay: Payer: Medicare Other | Admitting: Oncology

## 2019-07-27 ENCOUNTER — Ambulatory Visit (INDEPENDENT_AMBULATORY_CARE_PROVIDER_SITE_OTHER): Payer: Medicare Other | Admitting: Podiatry

## 2019-07-27 ENCOUNTER — Other Ambulatory Visit: Payer: Self-pay

## 2019-07-27 ENCOUNTER — Encounter: Payer: Self-pay | Admitting: Podiatry

## 2019-07-27 VITALS — Temp 97.2°F

## 2019-07-27 DIAGNOSIS — M79676 Pain in unspecified toe(s): Secondary | ICD-10-CM

## 2019-07-27 DIAGNOSIS — G629 Polyneuropathy, unspecified: Secondary | ICD-10-CM

## 2019-07-27 DIAGNOSIS — B351 Tinea unguium: Secondary | ICD-10-CM | POA: Diagnosis not present

## 2019-07-27 NOTE — Progress Notes (Signed)
This patient returns to my office for at risk foot care.  This patient requires this care by a professional since this patient will be at risk due to having neuropathy.   This patient is unable to cut nails himself since the patient cannot reach his nails.These nails are painful walking and wearing shoes.  This patient presents for at risk foot care today.  General Appearance  Alert, conversant and in no acute stress.  Vascular  Dorsalis pedis and posterior tibial  pulses are palpable  bilaterally.  Capillary return is within normal limits  bilaterally. Temperature is within normal limits  bilaterally.  Neurologic  Senn-Weinstein monofilament wire test within normal limits  bilaterally. Muscle power within normal limits bilaterally.  Nails Thick disfigured discolored nails with subungual debris  from hallux to fifth toes bilaterally. No evidence of bacterial infection or drainage bilaterally.  Orthopedic  No limitations of motion  feet .  No crepitus or effusions noted.  No bony pathology or digital deformities noted.  Skin  normotropic skin with no porokeratosis noted bilaterally.  No signs of infections or ulcers noted.     Onychomycosis  Pain in right toes  Pain in left toes  Consent was obtained for treatment procedures.   Mechanical debridement of nails 1-5  bilaterally performed with a nail nipper.  Filed with dremel without incident.    Return office visit  10 weeks                    Told patient to return for periodic foot care and evaluation due to potential at risk complications.   Genine Beckett DPM  

## 2019-08-04 DIAGNOSIS — Z7982 Long term (current) use of aspirin: Secondary | ICD-10-CM | POA: Diagnosis not present

## 2019-08-04 DIAGNOSIS — S91031A Puncture wound without foreign body, right ankle, initial encounter: Secondary | ICD-10-CM | POA: Diagnosis not present

## 2019-08-14 ENCOUNTER — Inpatient Hospital Stay (HOSPITAL_BASED_OUTPATIENT_CLINIC_OR_DEPARTMENT_OTHER): Payer: Medicare Other | Admitting: Oncology

## 2019-08-14 ENCOUNTER — Encounter: Payer: Self-pay | Admitting: Oncology

## 2019-08-14 ENCOUNTER — Other Ambulatory Visit: Payer: Self-pay

## 2019-08-14 ENCOUNTER — Inpatient Hospital Stay: Payer: Medicare Other | Attending: Oncology

## 2019-08-14 VITALS — BP 157/87 | HR 66 | Temp 95.8°F | Resp 18 | Wt 190.6 lb

## 2019-08-14 DIAGNOSIS — G629 Polyneuropathy, unspecified: Secondary | ICD-10-CM | POA: Diagnosis not present

## 2019-08-14 DIAGNOSIS — Z801 Family history of malignant neoplasm of trachea, bronchus and lung: Secondary | ICD-10-CM | POA: Diagnosis not present

## 2019-08-14 DIAGNOSIS — F419 Anxiety disorder, unspecified: Secondary | ICD-10-CM | POA: Insufficient documentation

## 2019-08-14 DIAGNOSIS — I10 Essential (primary) hypertension: Secondary | ICD-10-CM | POA: Insufficient documentation

## 2019-08-14 DIAGNOSIS — Z7982 Long term (current) use of aspirin: Secondary | ICD-10-CM | POA: Insufficient documentation

## 2019-08-14 DIAGNOSIS — D472 Monoclonal gammopathy: Secondary | ICD-10-CM

## 2019-08-14 DIAGNOSIS — Z79899 Other long term (current) drug therapy: Secondary | ICD-10-CM | POA: Insufficient documentation

## 2019-08-14 DIAGNOSIS — I2510 Atherosclerotic heart disease of native coronary artery without angina pectoris: Secondary | ICD-10-CM | POA: Diagnosis not present

## 2019-08-14 DIAGNOSIS — Z8349 Family history of other endocrine, nutritional and metabolic diseases: Secondary | ICD-10-CM | POA: Diagnosis not present

## 2019-08-14 DIAGNOSIS — I4891 Unspecified atrial fibrillation: Secondary | ICD-10-CM | POA: Insufficient documentation

## 2019-08-14 LAB — CBC WITH DIFFERENTIAL/PLATELET
Abs Immature Granulocytes: 0.01 10*3/uL (ref 0.00–0.07)
Basophils Absolute: 0 10*3/uL (ref 0.0–0.1)
Basophils Relative: 1 %
Eosinophils Absolute: 0.5 10*3/uL (ref 0.0–0.5)
Eosinophils Relative: 10 %
HCT: 40.8 % (ref 39.0–52.0)
Hemoglobin: 13.6 g/dL (ref 13.0–17.0)
Immature Granulocytes: 0 %
Lymphocytes Relative: 14 %
Lymphs Abs: 0.7 10*3/uL (ref 0.7–4.0)
MCH: 31.9 pg (ref 26.0–34.0)
MCHC: 33.3 g/dL (ref 30.0–36.0)
MCV: 95.6 fL (ref 80.0–100.0)
Monocytes Absolute: 0.6 10*3/uL (ref 0.1–1.0)
Monocytes Relative: 11 %
Neutro Abs: 3.3 10*3/uL (ref 1.7–7.7)
Neutrophils Relative %: 64 %
Platelets: 176 10*3/uL (ref 150–400)
RBC: 4.27 MIL/uL (ref 4.22–5.81)
RDW: 12.7 % (ref 11.5–15.5)
WBC: 5.1 10*3/uL (ref 4.0–10.5)
nRBC: 0 % (ref 0.0–0.2)

## 2019-08-14 LAB — COMPREHENSIVE METABOLIC PANEL
ALT: 28 U/L (ref 0–44)
AST: 34 U/L (ref 15–41)
Albumin: 3.9 g/dL (ref 3.5–5.0)
Alkaline Phosphatase: 69 U/L (ref 38–126)
Anion gap: 7 (ref 5–15)
BUN: 20 mg/dL (ref 8–23)
CO2: 27 mmol/L (ref 22–32)
Calcium: 8.8 mg/dL — ABNORMAL LOW (ref 8.9–10.3)
Chloride: 104 mmol/L (ref 98–111)
Creatinine, Ser: 0.84 mg/dL (ref 0.61–1.24)
GFR calc Af Amer: >60 mL/min (ref >60)
GFR calc non Af Amer: >60 mL/min (ref >60)
Glucose, Bld: 115 mg/dL — ABNORMAL HIGH (ref 70–99)
Potassium: 4.4 mmol/L (ref 3.5–5.1)
Sodium: 138 mmol/L (ref 135–145)
Total Bilirubin: 1.2 mg/dL (ref 0.3–1.2)
Total Protein: 7 g/dL (ref 6.5–8.1)

## 2019-08-14 NOTE — Progress Notes (Signed)
Hematology/Oncology follow up  note [facility] [phone] [phone]   Patient Care Team: Olin Hauser, DO as PCP - General (Family Medicine) Minna Merritts, MD as PCP - Cardiology (Cardiology) Renata Caprice as Physician Assistant (Orthopedic Surgery) Nickie Retort, MD as Consulting Physician (Urology) Gardiner Barefoot, DPM as Consulting Physician (Podiatry) Stark Klein Bing Neighbors, NP as Nurse Practitioner (Neurology)  REFERRING PROVIDER: Dr.Shah  CHIEF COMPLAINTS/PURPOSE OF CONSULTATION:  Follow-up for MGUS  HISTORY OF PRESENTING ILLNESS:  James Cardenas is a  79 y.o.  male with PMH listed below who was referred to me for evaluation of elevated M spike.  Patient follows up with [facility] Neurology Dr. Bobbye Riggs for evaluation of neuropathy.  Patient states that symptom has started in 2060 has slightly progressed.  Mostly focus on posterior lower extremity, feels like bilateral aching, soreness, tightness.  Intermittently he will also have numbness and tingling in his feet without burning and pain.  He has had extensive workup done including normal thyroid function, normal B12, ESR, ANA, rheumatoid factor, diabetes screening, heavy metal screening.Marland Kitchen  He has had SPEP and immunofixation which showed IgG monoclonal antibody with kappa light chain specificity, M spike 0.7 g/dL.  Patient denies any bone pain.  He has good energy level and remains very active.  No raynaud's syndrome  # 10/18/2018 Echo  LVEF 55-60%, moderately increased LV wall thickness.  # atrial fibrillation/flutter, rapid ventricular response, was seen by cardiology.  Patient underwent cardioversion procedure on 01/20/2019.  INTERVAL HISTORY EVON LOPEZPEREZ is a 79 y.o. male who has above history reviewed by me today present for follow-up visit for management of MGUS. He reports feeling well. No new complaints.  His lower extremity neuropathy has improved  and symptom is stable.  He takes Alpha lipoic acid, Vitamin D3.    Review of Systems  Constitutional: Negative for chills, fever, malaise/fatigue and weight loss.  HENT: Negative for ear discharge, ear pain, nosebleeds and sore throat.   Eyes: Negative for blurred vision, double vision, photophobia, pain and redness.  Respiratory: Negative for cough, hemoptysis, sputum production, shortness of breath and wheezing.   Cardiovascular: Negative for chest pain, palpitations, orthopnea and leg swelling.  Gastrointestinal: Negative for abdominal pain, blood in stool, nausea and vomiting.  Genitourinary: Negative for dysuria and urgency.  Musculoskeletal: Negative for back pain, myalgias and neck pain.  Skin: Negative for itching and rash.  Neurological: Negative for dizziness, tingling, tremors and focal weakness.  Endo/Heme/Allergies: Negative for environmental allergies. Does not bruise/bleed easily.  Psychiatric/Behavioral: Negative for depression and hallucinations.    MEDICAL HISTORY:  Past Medical History:  Diagnosis Date  . Anxiety   . Arthritis of knee   . Atypical atrial flutter ([place])    a. Dx 12/15/2018. CHA2DS2VASc 3-->eliquis.  . Carotid artery disease ([place])   . 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 Hearing loss    wears hearing aids  . Hypertension   . Neuropathy   . Nonobstructive Coronary artery disease    a. 07/2018 Cath: LMN nl, LAD/LCX/RCA no significant dzs. EF 55-65%.  . S/P TAVR (transcatheter aortic valve replacement)    a. 09/2018 TAVR: 26 mm Edwards Sapien THV via the TF approach; b. 10/2018 Echo: EF 55-60%. Nl RV fxn. RVSP 20.53mHg. Mild BAE. No AI or perivalvular leak.  . Severe aortic stenosis     SURGICAL HISTORY: Past Surgical History:  Procedure Laterality Date  . CARDIOVERSION N/A 01/20/2019  Procedure: CARDIOVERSION;  Surgeon: Minna Merritts, MD;  Location: [facility];  Service: Cardiovascular;  Laterality: N/A;  . COLONOSCOPY    . EYE SURGERY  Bilateral    cataract  . HERNIA REPAIR Left    Inguinal  . NECK SURGERY  2011   Cervical Fusion  . RIGHT HEART CATH AND CORONARY ANGIOGRAPHY N/A 07/28/2018   Procedure: RIGHT HEART CATH AND CORONARY ANGIOGRAPHY;  Surgeon: Minna Merritts, MD;  Location: [place];  Service: Cardiovascular;  Laterality: N/A;  . TEE WITHOUT CARDIOVERSION N/A 09/13/2018   Procedure: TRANSESOPHAGEAL ECHOCARDIOGRAM (TEE);  Surgeon: Sherren Mocha, MD;  Location: [place];  Service: Open Heart Surgery;  Laterality: N/A;  . TRANSCATHETER AORTIC VALVE REPLACEMENT, TRANSFEMORAL  09/13/2018  . TRANSCATHETER AORTIC VALVE REPLACEMENT, TRANSFEMORAL N/A 09/13/2018   Procedure: TRANSCATHETER AORTIC VALVE REPLACEMENT, TRANSFEMORAL;  Surgeon: Sherren Mocha, MD;  Location: [place];  Service: Open Heart Surgery;  Laterality: N/A;    SOCIAL HISTORY: Social History   Socioeconomic History  . Marital status: Divorced    Spouse name: Not on file  . Number of children: Not on file  . Years of education: Not on file  . Highest education level: Bachelor's degree (e.g., BA, AB, BS)  Occupational History  . Occupation: Retired Designer, multimedia, Public affairs consultant / heat treating)  Tobacco Use  . Smoking status: Never Smoker  . Smokeless tobacco: Never Used  Substance and Sexual Activity  . Alcohol use: Yes    Alcohol/week: 7.0 standard drinks    Types: 1 Cans of beer, 6 Glasses of wine per week  . Drug use: No  . Sexual activity: Yes  Other Topics Concern  . Not on file  Social History Narrative  . Not on file   Social Determinants of Health   Financial Resource Strain:   . Difficulty of Paying Living Expenses:   Food Insecurity:   . Worried About Charity fundraiser in the Last Year:   . Arboriculturist in the Last Year:   Transportation Needs:   . Film/video editor (Medical):   Marland Kitchen Lack of Transportation (Non-Medical):   Physical Activity:   . Days of Exercise per Week:   . Minutes of Exercise per Session:    Stress:   . Feeling of Stress :   Social Connections:   . Frequency of Communication with Friends and Family:   . Frequency of Social Gatherings with Friends and Family:   . Attends Religious Services:   . Active Member of Clubs or Organizations:   . Attends Archivist Meetings:   Marland Kitchen Marital Status:   Intimate Partner Violence:   . Fear of Current or Ex-Partner:   . Emotionally Abused:   Marland Kitchen Physically Abused:   . Sexually Abused:     FAMILY HISTORY: Family History  Problem Relation Age of Onset  . Thyroid disease Mother   . Lung cancer Father   . Pancreatic cancer Brother   . Alzheimer's disease Sister   . Prostate cancer Neg Hx   . Bladder Cancer Neg Hx   . Kidney cancer Neg Hx     ALLERGIES:  is allergic to nickel.  MEDICATIONS:  Current Outpatient Medications  Medication Sig Dispense Refill  . Alpha-Lipoic Acid 600 MG CAPS Take 600 mg by mouth every morning.     Marland Kitchen ALPRAZolam (XANAX) 0.5 MG tablet TAKE 1 TABLET BY MOUTH 2 TIMES A DAY AS NEEDED FOR ANXIETY 60 tablet 2  . aspirin EC  81 MG tablet Take 1 tablet (81 mg total) by mouth daily. 90 tablet 3  . Azelaic Acid 15 % cream Apply 1 application topically daily. After skin is thoroughly washed and patted dry, gently but thoroughly massage a thin film of azelaic acid cream into the affected area once daily    . Carboxymethylcellul-Glycerin (LUBRICATING EYE DROPS OP) Place 1 drop into both eyes 2 (two) times a day.     . cholecalciferol (VITAMIN D3) 25 MCG (1000 UT) tablet Take 1,000 Units by mouth daily.    Marland Kitchen losartan (COZAAR) 50 MG tablet Take 50 mg by mouth daily.    . meclizine (ANTIVERT) 25 MG tablet Take 1 tablet (25 mg total) by mouth 3 (three) times daily as needed for dizziness. 30 tablet 1  . mirabegron ER (MYRBETRIQ) 50 MG TB24 tablet Take 1 tablet (50 mg total) by mouth daily. 90 tablet 3  . Multiple Vitamin (MULTIVITAMIN WITH MINERALS) TABS tablet Take 1 tablet by mouth daily at 3 pm. Centrum    . OVER  THE COUNTER MEDICATION Take 1 tablet by mouth at bedtime as needed (sleep). GNC PREVENTIVE NUTRITION TRI-SLEEP    . polyethylene glycol (MIRALAX / GLYCOLAX) packet Take 17 g by mouth 2 (two) times daily as needed (constipation).     . rosuvastatin (CRESTOR) 10 MG tablet Take 1 tablet (10 mg total) by mouth daily. 90 tablet 3  . sildenafil (REVATIO) 20 MG tablet Take up to 5 pills about 30 min prior to sex 50 tablet 8  . tretinoin (RETIN-A) 0.1 % cream Apply 1 application topically at bedtime.   0  . triamcinolone cream (KENALOG) 0.1 % APPLY TWICE DAILY TO AFFECTED RASH AS NEEDED    . amoxicillin (AMOXIL) 500 MG tablet Take 2,000 mg (4 pills) one hour prior to all dental visits. There are enough for two visits in this bottle. (Patient not taking: Reported on 08/14/2019) 8 tablet 6   No current facility-administered medications for this visit.     PHYSICAL EXAMINATION: ECOG PERFORMANCE STATUS: 0 - Asymptomatic Vitals:   08/14/19 1014  BP: (!) 157/87  Pulse: 66  Resp: 18  Temp: (!) 95.8 F (35.4 C)   Filed Weights   08/14/19 1014  Weight: 190 lb 9.6 oz (86.5 kg)    Physical Exam  Constitutional: He is oriented to person, place, and time. No distress.  HENT:  Head: Normocephalic and atraumatic.  Nose: Nose normal.  Mouth/Throat: Oropharynx is clear and moist. No oropharyngeal exudate.  Eyes: Pupils are equal, round, and reactive to light. Conjunctivae and EOM are normal. No scleral icterus.  Neck: No JVD present.  Cardiovascular: Normal rate and regular rhythm.  No murmur heard. Pulmonary/Chest: Effort normal and breath sounds normal. No respiratory distress. He has no wheezes. He has no rales. He exhibits no tenderness.  Abdominal: Soft. Bowel sounds are normal. He exhibits no distension. There is no abdominal tenderness. There is no rebound and no guarding.  Musculoskeletal:        General: No edema. Normal range of motion.     Cervical back: Normal range of motion and neck  supple.  Lymphadenopathy:    He has no cervical adenopathy.  Neurological: He is alert and oriented to person, place, and time. No cranial nerve deficit. He exhibits normal muscle tone. Coordination normal.  Skin: Skin is warm and dry. No rash noted. He is not diaphoretic. No erythema.  Psychiatric: Memory, affect and judgment normal.     LABORATORY DATA:  I have reviewed the data as listed Lab Results  Component Value Date   WBC 5.1 08/14/2019   HGB 13.6 08/14/2019   HCT 40.8 08/14/2019   MCV 95.6 08/14/2019   PLT 176 08/14/2019   Recent Labs    01/23/19 0930 01/23/19 1034 02/03/19 1454 08/14/19 0957  NA 138  --  138 138  K 4.0  --  4.3 4.4  CL 106  --  102 104  CO2 25  --  22 27  GLUCOSE 99  --  103* 115*  BUN 21  --  17 20  CREATININE 0.98  --  0.95 0.84  CALCIUM 9.0  --  9.6 8.8*  GFRNONAA >60  --  77 >60  GFRAA >60  --  89 >60  PROT 7.7  --  7.6 7.0  ALBUMIN 4.2  --  4.5 3.9  AST 29  --  27 34  ALT 21  --  23 28  ALKPHOS 79  --  110 69  BILITOT 1.9*  --  0.9 1.2  BILIDIR  --  0.4*  --   --    09/29/2017 Vitamin B12 920 Labs done at [facility] 06/08/2017 Folate 22.3    ASSESSMENT & PLAN:  1. MGUS (monoclonal gammopathy of unknown significance)   #MGUS, IgG type. SPEP and IgG are pending at the time of dictation.  Continue monitor labs every 6 months.   All questions were answered. The patient knows to call the clinic with any problems questions or concerns.  Return of visit: 6 months.  Earlie Server, MD, PhD Hematology Oncology [facility] at [facility] Pager- [phone] 08/14/2019

## 2019-08-14 NOTE — Progress Notes (Signed)
Patient reports no problems today.  

## 2019-08-15 LAB — PROTEIN ELECTROPHORESIS, SERUM
A/G Ratio: 1.2 (ref 0.7–1.7)
Albumin ELP: 3.6 g/dL (ref 2.9–4.4)
Alpha-1-Globulin: 0.2 g/dL (ref 0.0–0.4)
Alpha-2-Globulin: 0.5 g/dL (ref 0.4–1.0)
Beta Globulin: 0.9 g/dL (ref 0.7–1.3)
Gamma Globulin: 1.2 g/dL (ref 0.4–1.8)
Globulin, Total: 2.9 g/dL (ref 2.2–3.9)
M-Spike, %: 0.6 g/dL — ABNORMAL HIGH
Total Protein ELP: 6.5 g/dL (ref 6.0–8.5)

## 2019-08-15 LAB — IGG: IgG (Immunoglobin G), Serum: 1314 mg/dL (ref 603–1613)

## 2019-08-29 ENCOUNTER — Ambulatory Visit: Payer: Medicare Other | Admitting: Cardiovascular Disease

## 2019-09-11 NOTE — Progress Notes (Signed)
James Cardenas                                       Cardiology Office Note    Date:  09/14/2019   ID:  James Cardenas, DOB Sep 09, 1940, MRN [id number]  PCP:  Olin Hauser, DO  Cardiologist: Dr. Rockey Situ / Dr. Burt Knack & Dr. Cyndia Bent (TAVR).  CC: 1 year s/p TAVR  History of Present Illness:  James Cardenas is a 79 y.o. male with a history of HTN, MGUS, pulm HTN, carotid artery disease, neuropathy with mild gait imbalance, severe AS/mod AIs/p TAVR (09/13/18) who presents for follow up.  He has always been very active as an adult playing tennis, exercising, and riding a bicycle but over the past few years has been slow due to development of peripheral neuropathy affecting his balance. Last year, he developed exertional fatigue as well as some chest tightness. Echocardiogram on 04/29/2018 showed a severely calcified aortic valve with a mean gradient of 37 mmHg and a peak gradient of 62 mmHg. There was moderate aortic insufficiency. Left ventricular ejection fraction was 55% with moderate concentric left ventricular hypertrophy. He underwent cardiac catheterization on 07/28/2018 showing no significant coronary disease. Right heart pressures were normal.  He underwentsuccessful TAVR with a67mm Edwards Sapien 3 THV via the TF approach on 09/13/2018. Post operative echoshowed EF 55%, normally functioning TAVR with no PVL and mean gradient 19 mm Hg.He was discharged on aspirin and plavix. 1 month Echo 10/18/18 showed EF 55-60%, mean gradient 17 mmHG and no PVL.  On his last day of cardiac rehab noted to be in new onset atrial flutter with RVR. He was started on apixiban and underwent successful cardioversion on 01/20/2019. The patient and Dr. Rockey Situ decided to stop apixiban after 1 month given cost and severe anxiety around taking this medication. He was started back on aspirin.   Today he presents to clinic for follow up. No CP or SOB. He has  chronic mild LE edema. No orthopnea or PND. No dizziness or syncope. No blood in stool or urine. No palpitations. Going through some major transitions with selling his house and moving to his apartment and giving away his dog. He has been very active moving and walking his other dog.     Past Medical History:  Diagnosis Date  . Anxiety   . Arthritis of knee   . Atypical atrial flutter ([place])    a. Dx 12/15/2018. CHA2DS2VASc 3-->eliquis.  . Carotid artery disease ([place])   . 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 Hearing loss    wears hearing aids  . Hypertension   . Neuropathy   . Nonobstructive Coronary artery disease    a. 07/2018 Cath: LMN nl, LAD/LCX/RCA no significant dzs. EF 55-65%.  . S/P TAVR (transcatheter aortic valve replacement)    a. 09/2018 TAVR: 26 mm Edwards Sapien THV via the TF approach; b. 10/2018 Echo: EF 55-60%. Nl RV fxn. RVSP 20.18mmHg. Mild BAE. No AI or perivalvular leak.  . Severe aortic stenosis     Past Surgical History:  Procedure Laterality Date  . CARDIOVERSION N/A 01/20/2019   Procedure: CARDIOVERSION;  Surgeon: Minna Merritts, MD;  Location: [facility];  Service: Cardiovascular;  Laterality: N/A;  . COLONOSCOPY    . EYE SURGERY Bilateral    cataract  . HERNIA REPAIR  Left    Inguinal  . NECK SURGERY  2011   Cervical Fusion  . RIGHT HEART CATH AND CORONARY ANGIOGRAPHY N/A 07/28/2018   Procedure: RIGHT HEART CATH AND CORONARY ANGIOGRAPHY;  Surgeon: Minna Merritts, MD;  Location: [facility];  Service: Cardiovascular;  Laterality: N/A;  . TEE WITHOUT CARDIOVERSION N/A 09/13/2018   Procedure: TRANSESOPHAGEAL ECHOCARDIOGRAM (TEE);  Surgeon: Sherren Mocha, MD;  Location: [facility];  Service: Open Heart Surgery;  Laterality: N/A;  . TRANSCATHETER AORTIC VALVE REPLACEMENT, TRANSFEMORAL  09/13/2018  . TRANSCATHETER AORTIC VALVE REPLACEMENT, TRANSFEMORAL N/A 09/13/2018   Procedure: TRANSCATHETER AORTIC VALVE REPLACEMENT, TRANSFEMORAL;  Surgeon: Sherren Mocha, MD;  Location: [facility];  Service: Open Heart Surgery;  Laterality: N/A;    Current Medications: Outpatient Medications Prior to Visit  Medication Sig Dispense Refill  . Alpha-Lipoic Acid 600 MG CAPS Take 600 mg by mouth every morning.     Marland Kitchen ALPRAZolam (XANAX) 0.5 MG tablet TAKE 1 TABLET BY MOUTH 2 TIMES A DAY AS NEEDED FOR ANXIETY 60 tablet 2  . aspirin EC 81 MG tablet Take 1 tablet (81 mg total) by mouth daily. 90 tablet 3  . Azelaic Acid 15 % cream Apply 1 application topically daily. After skin is thoroughly washed and patted dry, gently but thoroughly massage a thin film of azelaic acid cream into the affected area once daily    . Carboxymethylcellul-Glycerin (LUBRICATING EYE DROPS OP) Place 1 drop into both eyes 2 (two) times a day.     . cholecalciferol (VITAMIN D3) 25 MCG (1000 UT) tablet Take 1,000 Units by mouth daily.    Marland Kitchen losartan (COZAAR) 100 MG tablet Take 100 mg by mouth daily.    . meclizine (ANTIVERT) 25 MG tablet Take 1 tablet (25 mg total) by mouth 3 (three) times daily as needed for dizziness. 30 tablet 1  . mirabegron ER (MYRBETRIQ) 50 MG TB24 tablet Take 1 tablet (50 mg total) by mouth daily. 90 tablet 3  . Multiple Vitamin (MULTIVITAMIN WITH MINERALS) TABS tablet Take 1 tablet by mouth daily at 3 pm. Centrum    . OVER THE COUNTER MEDICATION Take 1 tablet by mouth at bedtime as needed (sleep). GNC PREVENTIVE NUTRITION TRI-SLEEP    . polyethylene glycol (MIRALAX / GLYCOLAX) packet Take 17 g by mouth 2 (two) times daily as needed (constipation).     . rosuvastatin (CRESTOR) 10 MG tablet Take 1 tablet (10 mg total) by mouth daily. 90 tablet 3  . sildenafil (REVATIO) 20 MG tablet Take up to 5 pills about 30 min prior to sex 50 tablet 8  . tretinoin (RETIN-A) 0.1 % cream Apply 1 application topically at bedtime.   0  . triamcinolone cream (KENALOG) 0.1 % APPLY TWICE DAILY TO AFFECTED RASH AS NEEDED    . amoxicillin (AMOXIL) 500 MG tablet Take 2,000 mg (4 pills) one hour  prior to all dental visits. There are enough for two visits in this bottle. 8 tablet 6  . losartan (COZAAR) 50 MG tablet Take 50 mg by mouth daily.     No facility-administered medications prior to visit.     Allergies:   Nickel   Social History   Socioeconomic History  . Marital status: Divorced    Spouse name: Not on file  . Number of children: Not on file  . Years of education: Not on file  . Highest education level: Bachelor's degree (e.g., BA, AB, BS)  Occupational History  . Occupation: Retired Designer, multimedia, Public affairs consultant /  heat treating)  Tobacco Use  . Smoking status: Never Smoker  . Smokeless tobacco: Never Used  Substance and Sexual Activity  . Alcohol use: Yes    Alcohol/week: 7.0 standard drinks    Types: 1 Cans of beer, 6 Glasses of wine per week  . Drug use: No  . Sexual activity: Yes  Other Topics Concern  . Not on file  Social History Narrative  . Not on file   Social Determinants of Health   Financial Resource Strain:   . Difficulty of Paying Living Expenses:   Food Insecurity:   . Worried About Charity fundraiser in the Last Year:   . Arboriculturist in the Last Year:   Transportation Needs:   . Film/video editor (Medical):   Marland Kitchen Lack of Transportation (Non-Medical):   Physical Activity:   . Days of Exercise per Week:   . Minutes of Exercise per Session:   Stress:   . Feeling of Stress :   Social Connections:   . Frequency of Communication with Friends and Family:   . Frequency of Social Gatherings with Friends and Family:   . Attends Religious Services:   . Active Member of Clubs or Organizations:   . Attends Archivist Meetings:   Marland Kitchen Marital Status:      Family History:  The patient's family history includes Alzheimer's disease in his sister; Lung cancer in his father; Pancreatic cancer in his brother; Thyroid disease in his mother.     ROS:   Please see the history of present illness.    ROS All other systems reviewed and  are negative.   PHYSICAL EXAM:   VS:  BP 130/72   Pulse 62   Ht 6\' 3"  (1.905 m)   Wt 187 lb (84.8 kg)   BMI 23.37 kg/m    GEN: Well nourished, well developed, in no acute distress HEENT: normal Neck: no JVD or masses Cardiac: RRR; soft flow murmur. Bo, rubs, or gallops. 1 + bilateral LE edema  Respiratory:  clear to auscultation bilaterally, normal work of breathing GI: soft, nontender, nondistended, + BS MS: no deformity or atrophy Skin: warm and dry, no rash Neuro:  Alert and Oriented x 3, Strength and sensation are intact Psych: euthymic mood, full affect   Wt Readings from Last 3 Encounters:  09/14/19 187 lb (84.8 kg)  08/14/19 190 lb 9.6 oz (86.5 kg)  05/24/19 192 lb (87.1 kg)      Studies/Labs Reviewed:   EKG:  EKG is NOT ordered today.    Recent Labs: 12/16/2018: Magnesium 2.1; TSH 2.066 08/14/2019: ALT 28; BUN 20; Creatinine, Ser 0.84; Hemoglobin 13.6; Platelets 176; Potassium 4.4; Sodium 138   Lipid Panel    Component Value Date/Time   CHOL 146 11/24/2018 0813   CHOL 171 11/06/2013 0000   TRIG 48 11/24/2018 0813   TRIG 45 11/06/2013 0000   HDL 71 11/24/2018 0813   HDL 72 11/06/2013 0000   CHOLHDL 2.1 11/24/2018 0813   VLDL 8 09/29/2016 0848   LDLCALC 61 11/24/2018 0813    Additional studies/ records that were reviewed today include:  TAVR OPERATIVE NOTE   Date of Procedure:09/13/2018  Preoperative Diagnosis:Severe Aortic Stenosis   Postoperative Diagnosis:Same   Procedure:   Transcatheter Aortic Valve Replacement - PercutaneousLeftTransfemoral Approach Edwards Sapien 3 THV (size 95mm, model # 9600TFX, serial # [id number])  Co-Surgeons:Bryan Alveria Apley, MDand Sherren Mocha, MD   Anesthesiologist:Kevin Conrad [place], MD  Dala Dock, MD  Pre-operative  Echo Findings: ? Severe aortic stenosis ? Normalleft ventricular  systolic function  Post-operative Echo Findings: ? noparavalvular leak ? Normalleft ventricular systolic function  _____________  Echo 09/14/18: IMPRESSIONS 1. The left ventricle has normal systolic function, with an ejection fraction of 55%. The cavity size was normal. There is severe concentric left ventricular hypertrophy. Left ventricular diastolic function could not be evaluated due to nondiagnostic  images. No evidence of left ventricular regional wall motion abnormalities. 2. The right ventricle has normal systolic function. The cavity was normal. There is no increase in right ventricular wall thickness. 3. - TAVR: S/P 54mm Edwards Sapien 3 TAVR with normal functioning prosthesis. There is no perivalvular AI. AV Area (VTI): 1.60 cm. AV Mean Grad: 19.0 . AV Vmax: 309.50  _____________  10/18/18 IMPRESSIONS 1. The left ventricle has normal systolic function, with an ejection fraction of 55-60%. The cavity size was normal. There is moderately increased left ventricular wall thickness. Left ventricular diastolic Doppler parameters are consistent with  pseudonormalization. Elevated mean left atrial pressure No evidence of left ventricular regional wall motion abnormalities. 2. The right ventricle has normal systolic function. The cavity was normal. There is no increase in right ventricular wall thickness. Right ventricular systolic pressure is normal with an estimated pressure of 20.1 mmHg. 3. Left atrial size was mildly dilated. 4. Right atrial size was mildly dilated. 5. A 26 Edwards Sapien bioprosthetic aortic valve (TAVR) valve is present in the aortic position. Procedure Date: 09/13/18 Normal aortic valve prosthesis. Echo findings shows no evidence of regurgitation or perivalvular leak of the aortic prosthesis. Mean gradient is mildly elevated but similar to prior echo from 09/14/2018.  _________________  Carotid dopplers 09/14/19 Summary: Right Carotid: Velocities in  the right ICA are consistent with a 1-39% stenosis.  Left Carotid: Velocities in the left ICA are consistent with a 40-59% stenosis.  Vertebrals:  Bilateral vertebral arteries demonstrate antegrade flow. Subclavians: Normal flow hemodynamics were seen in bilateral subclavian              arteries.  *See table(s) above for measurements and observations. Suggest follow up study in 12 months.  _________________  Echo 09/14/19 IMPRESSIONS  1. 26 mm S3 in aortic position. V max 2.7 m/s, MG 15 mmHG, EOA 1.26 cm2, DI 0.33. Normal functioning TAVR without paravalvular leak or regurgitation. Gradients are unchanged from prior. The aortic valve has been repaired/replaced. Aortic valve  regurgitation is not visualized.  2. Left ventricular ejection fraction, by estimation, is 60 to 65%. The left ventricle has normal function. The left ventricle has no regional wall motion abnormalities. There is moderate concentric left ventricular hypertrophy. Left ventricular  diastolic parameters are consistent with Grade II diastolic dysfunction (pseudonormalization). Elevated left atrial pressure.  3. Right ventricular systolic function is normal. The right ventricular size is normal. There is normal pulmonary artery systolic pressure. The estimated right ventricular systolic pressure is 0000000 mmHg.  4. Left atrial size was mildly dilated.  5. The mitral valve is grossly normal. Trivial mitral valve regurgitation. No evidence of mitral stenosis.  6. The inferior vena cava is normal in size with greater than 50% respiratory variability, suggesting right atrial pressure of 3 mmHg.  Comparison(s): No significant change from prior study.    ASSESSMENT & PLAN:   Severe AS s/p TAVR: Echo today shows EF 60%, normally functioning TAVR with a mean gradient of 15 mmHg and no PVL. He has NYHA class I symptoms. SBE prophylaxis discussed; he has amoxicillin. Continue on a baby  aspirin indefinitely. Continue regular  follow up with Dr. Rockey Situ.   HTN:BP well controlled.   Carotid artery diease: dopplers showed 1-39% RICA and 123456 LICA stenosis.  Repeat carotids today showed stable disease. Follow up studies recommended in 1 year  Atrial flutter: sounds regular on exam. He is no longer on Eliquis due to side effect and severe anxiety related to this medication. He wears a smart watch and meticulously follows his Bps and HRs. He has not had any recent elevated HRs.   Medication Adjustments/Labs and Tests Ordered: Current medicines are reviewed at length with the patient today.  Concerns regarding medicines are outlined above.  Medication changes, Labs and Tests ordered today are listed in the Patient Instructions below. Patient Instructions  Medication Instructions:  Your physician recommends that you continue on your current medications as directed. Please refer to the Current Medication list given to you today.  *If you need a refill on your cardiac medications before your next appointment, please call your pharmacy*   Lab Work: None Ordered If you have labs (blood work) drawn today and your tests are completely normal, you will receive your results only by: Marland Kitchen MyChart Message (if you have MyChart) OR . A paper copy in the mail If you have any lab test that is abnormal or we need to change your treatment, we will call you to review the results.   Testing/Procedures: None Ordered   Follow-Up: At [facility], you and your health needs are our priority.  As part of our continuing mission to provide you with exceptional heart care, we have created designated Provider Care Teams.  These Care Teams include your primary Cardiologist (physician) and Advanced Practice Providers (APPs -  Physician Assistants and Nurse Practitioners) who all work together to provide you with the care you need, when you need it.  We recommend signing up for the patient portal called "MyChart".  Sign up information is  provided on this After Visit Summary.  MyChart is used to connect with patients for Virtual Visits (Telemedicine).  Patients are able to view lab/test results, encounter notes, upcoming appointments, etc.  Non-urgent messages can be sent to your provider as well.   To learn more about what you can do with MyChart, go to [email or web address].    Your next appointment:    As Needed  The format for your next appointment:   Either In Person or Virtual  Provider:   Sherren Mocha, MD or Nell Range, PA-C        Signed, Angelena Form, PA-C  09/14/2019 4:46 PM    [address], [place], [place]  [address] Phone: [phone]; Fax: [phone]

## 2019-09-14 ENCOUNTER — Ambulatory Visit (INDEPENDENT_AMBULATORY_CARE_PROVIDER_SITE_OTHER): Payer: Medicare Other | Admitting: Physician Assistant

## 2019-09-14 ENCOUNTER — Other Ambulatory Visit: Payer: Self-pay

## 2019-09-14 ENCOUNTER — Ambulatory Visit (HOSPITAL_COMMUNITY)
Admission: RE | Admit: 2019-09-14 | Discharge: 2019-09-14 | Disposition: A | Discharge status: Home / Self Care | Payer: Medicare Other | Source: Ambulatory Visit | Attending: Cardiology | Admitting: Cardiology

## 2019-09-14 ENCOUNTER — Ambulatory Visit (HOSPITAL_BASED_OUTPATIENT_CLINIC_OR_DEPARTMENT_OTHER): Payer: Medicare Other

## 2019-09-14 ENCOUNTER — Encounter (HOSPITAL_COMMUNITY): Payer: Medicare Other

## 2019-09-14 ENCOUNTER — Other Ambulatory Visit: Payer: Self-pay | Admitting: Physician Assistant

## 2019-09-14 VITALS — BP 130/72 | HR 62 | Ht 75.0 in | Wt 187.0 lb

## 2019-09-14 DIAGNOSIS — I4892 Unspecified atrial flutter: Secondary | ICD-10-CM | POA: Diagnosis not present

## 2019-09-14 DIAGNOSIS — I10 Essential (primary) hypertension: Secondary | ICD-10-CM

## 2019-09-14 DIAGNOSIS — Z952 Presence of prosthetic heart valve: Secondary | ICD-10-CM

## 2019-09-14 DIAGNOSIS — I6522 Occlusion and stenosis of left carotid artery: Secondary | ICD-10-CM | POA: Diagnosis not present

## 2019-09-14 DIAGNOSIS — I779 Disorder of arteries and arterioles, unspecified: Secondary | ICD-10-CM

## 2019-09-14 MED ORDER — AMOXICILLIN 500 MG PO TABS: ORAL_TABLET | ORAL | 6 refills | Status: DC

## 2019-09-14 NOTE — Patient Instructions (Signed)
Medication Instructions:  Your physician recommends that you continue on your current medications as directed. Please refer to the Current Medication list given to you today.  *If you need a refill on your cardiac medications before your next appointment, please call your pharmacy*   Lab Work: None Ordered If you have labs (blood work) drawn today and your tests are completely normal, you will receive your results only by: Marland Kitchen MyChart Message (if you have MyChart) OR . A paper copy in the mail If you have any lab test that is abnormal or we need to change your treatment, we will call you to review the results.   Testing/Procedures: None Ordered   Follow-Up: At [facility], you and your health needs are our priority.  As part of our continuing mission to provide you with exceptional heart care, we have created designated Provider Care Teams.  These Care Teams include your primary Cardiologist (physician) and Advanced Practice Providers (APPs -  Physician Assistants and Nurse Practitioners) who all work together to provide you with the care you need, when you need it.  We recommend signing up for the patient portal called "MyChart".  Sign up information is provided on this After Visit Summary.  MyChart is used to connect with patients for Virtual Visits (Telemedicine).  Patients are able to view lab/test results, encounter notes, upcoming appointments, etc.  Non-urgent messages can be sent to your provider as well.   To learn more about what you can do with MyChart, go to [email or web address].    Your next appointment:    As Needed  The format for your next appointment:   Either In Person or Virtual  Provider:   Sherren Mocha, MD or Nell Range, PA-C

## 2019-09-15 ENCOUNTER — Telehealth (INDEPENDENT_AMBULATORY_CARE_PROVIDER_SITE_OTHER): Payer: Medicare Other | Admitting: Family Medicine

## 2019-09-15 ENCOUNTER — Telehealth: Payer: Self-pay | Admitting: Family Medicine

## 2019-09-15 ENCOUNTER — Encounter: Payer: Self-pay | Admitting: Family Medicine

## 2019-09-15 VITALS — BP 105/65 | HR 65 | Ht 75.0 in | Wt 187.0 lb

## 2019-09-15 DIAGNOSIS — K5909 Other constipation: Secondary | ICD-10-CM

## 2019-09-15 NOTE — Telephone Encounter (Addendum)
As per patient --constipation which could be side effect from Myrbetriq wants to discuss other options, so scheduled for 09/15/2019 around 2:20 pm virtual.

## 2019-09-15 NOTE — Telephone Encounter (Signed)
Copied from [place] [id number]. Topic: General - Other >> Sep 15, 2019 10:24 AM Celene Kras wrote: Reason for CRM: Pt called stating that he is having constipation. Pt states that he has been using the relax powder, but that the constipation is worsening. Pt is requesting a call back to go over dosage. Please advise .    [id number] - [place], [place] [address]  Phone: [phone] Fax: [phone]  Not a 24 hour pharmacy; exact hours not known.

## 2019-09-15 NOTE — Telephone Encounter (Signed)
See virtual visit note today  [name], [facility] 09/15/2019, 3:06 PM

## 2019-09-15 NOTE — Patient Instructions (Addendum)
Thank you for coming to the office today.  For Constipation (less frequent bowel movement that can be hard dry or involve straining).  Recommend trying OTC Miralax 17g = half or whole capful in large glass of water - occasionally even when feeling normal to help keep regulated.  If develop blockage of stool - you can increase to 2 + capfuls at ONCE to help improve and resolve it  - This medicine is very safe and can be used often without any problem and will not make you dehydrated. It is good for use on AS NEEDED BASIS or even MAINTENANCE therapy for longer term for several days to weeks at a time to help regulate bowel movements  Other more natural remedies or preventative treatment: - Increase hydration with water - Increase fiber in diet (high fiber foods = vegetables, leafy greens, oats/grains) - May take OTC Fiber supplement (metamucil powder or pill/gummy) - May try OTC Probiotic    Please schedule a Follow-up Appointment to: Return if symptoms worsen or fail to improve, for keep apt.  If you have any other questions or concerns, please feel free to call the office or send a message through [place]. You may also schedule an earlier appointment if necessary.  Additionally, you may be receiving a survey about your experience at our office within a few days to 1 week by e-mail or mail. We value your feedback.  [name], DO [place]

## 2019-09-15 NOTE — Progress Notes (Signed)
Virtual Visit via Telephone The purpose of this virtual visit is to provide medical care while limiting exposure to the novel coronavirus (COVID19) for both patient and office staff.  Consent was obtained for phone visit:  Yes.   Answered questions that patient had about telehealth interaction:  Yes.   I discussed the limitations, risks, security and privacy concerns of performing an evaluation and management service by telephone. I also discussed with the patient that there may be a patient responsible charge related to this service. The patient expressed understanding and agreed to proceed.  Patient Location: Home Provider Location: [facility])  ---------------------------------------------------------------------- Chief Complaint  Patient presents with  . Constipation    S: Reviewed CMA documentation. I have called patient and gathered additional HPI as follows:  Constipation - Side effect of Myrbetriq - Previous issue of same problem in past. He has tried Colace.  - He is taking Miralax powder now with good results. - He describes often if he is not taking Miralax regularly, but waits until starts to have symptoms he will take 1 whole capful for several days as needed. Sometimes if it doesn't start it soon enough he may have a larger bulky stool that can cause pain and difficulty with bowel movement with straining. He asks about stool impaction and dosing on Miralax.  S/p TAVR Doing very well. He will follow up as scheduled  Additionally Plans to eventually stay as needed with children up [place] if needed to stay with family, but he plans to remain in [place] currently - but will locate an apartment out of state for future if needed.  Denies any fevers, chills, sweats, body ache, cough, shortness of breath, sinus pain or pressure, headache, abdominal pain, diarrhea  Past Medical History:  Diagnosis Date  . Anxiety   . Arthritis of knee   . Atypical atrial  flutter ([place])    a. Dx 12/15/2018. CHA2DS2VASc 3-->eliquis.  . Carotid artery disease ([place])   . 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 Hearing loss    wears hearing aids  . Hypertension   . Neuropathy   . Nonobstructive Coronary artery disease    a. 07/2018 Cath: LMN nl, LAD/LCX/RCA no significant dzs. EF 55-65%.  . S/P TAVR (transcatheter aortic valve replacement)    a. 09/2018 TAVR: 26 mm Edwards Sapien THV via the TF approach; b. 10/2018 Echo: EF 55-60%. Nl RV fxn. RVSP 20.5mmHg. Mild BAE. No AI or perivalvular leak.  . Severe aortic stenosis    Social History   Tobacco Use  . Smoking status: Never Smoker  . Smokeless tobacco: Never Used  Substance Use Topics  . Alcohol use: Yes    Alcohol/week: 7.0 standard drinks    Types: 1 Cans of beer, 6 Glasses of wine per week  . Drug use: No    Current Outpatient Medications:  .  Alpha-Lipoic Acid 600 MG CAPS, Take 600 mg by mouth every morning. , Disp: , Rfl:  .  ALPRAZolam (XANAX) 0.5 MG tablet, TAKE 1 TABLET BY MOUTH 2 TIMES A DAY AS NEEDED FOR ANXIETY, Disp: 60 tablet, Rfl: 2 .  amoxicillin (AMOXIL) 500 MG tablet, Take 2,000 mg (4 pills) one hour prior to all dental visits. There are enough for two visits in this bottle., Disp: 8 tablet, Rfl: 6 .  aspirin EC 81 MG tablet, Take 1 tablet (81 mg total) by mouth daily., Disp: 90 tablet, Rfl: 3 .  Azelaic Acid 15 %  cream, Apply 1 application topically daily. After skin is thoroughly washed and patted dry, gently but thoroughly massage a thin film of azelaic acid cream into the affected area once daily, Disp: , Rfl:  .  Carboxymethylcellul-Glycerin (LUBRICATING EYE DROPS OP), Place 1 drop into both eyes 2 (two) times a day. , Disp: , Rfl:  .  cholecalciferol (VITAMIN D3) 25 MCG (1000 UT) tablet, Take 1,000 Units by mouth daily., Disp: , Rfl:  .  losartan (COZAAR) 100 MG tablet, Take 100 mg by mouth daily., Disp: , Rfl:  .  meclizine (ANTIVERT) 25 MG tablet, Take 1 tablet (25 mg total) by mouth  3 (three) times daily as needed for dizziness., Disp: 30 tablet, Rfl: 1 .  mirabegron ER (MYRBETRIQ) 50 MG TB24 tablet, Take 1 tablet (50 mg total) by mouth daily., Disp: 90 tablet, Rfl: 3 .  Multiple Vitamin (MULTIVITAMIN WITH MINERALS) TABS tablet, Take 1 tablet by mouth daily at 3 pm. Centrum, Disp: , Rfl:  .  OVER THE COUNTER MEDICATION, Take 1 tablet by mouth at bedtime as needed (sleep). GNC PREVENTIVE NUTRITION TRI-SLEEP, Disp: , Rfl:  .  polyethylene glycol (MIRALAX / GLYCOLAX) packet, Take 17 g by mouth 2 (two) times daily as needed (constipation). , Disp: , Rfl:  .  rosuvastatin (CRESTOR) 10 MG tablet, Take 1 tablet (10 mg total) by mouth daily., Disp: 90 tablet, Rfl: 3 .  tretinoin (RETIN-A) 0.1 % cream, Apply 1 application topically at bedtime. , Disp: , Rfl: 0 .  triamcinolone cream (KENALOG) 0.1 %, APPLY TWICE DAILY TO AFFECTED RASH AS NEEDED, Disp: , Rfl:  .  sildenafil (REVATIO) 20 MG tablet, Take up to 5 pills about 30 min prior to sex (Patient not taking: Reported on 09/15/2019), Disp: 50 tablet, Rfl: 8  Depression screen [facility] 2/9 11/29/2018 04/19/2018 04/19/2018  Decreased Interest 0 0 0  Down, Depressed, Hopeless 0 0 0  PHQ - 2 Score 0 0 0  Altered sleeping 0 0 -  Tired, decreased energy 0 0 -  Change in appetite 0 0 -  Feeling bad or failure about yourself  0 0 -  Trouble concentrating 0 0 -  Moving slowly or fidgety/restless 0 0 -  Suicidal thoughts 0 0 -  PHQ-9 Score 0 0 -  Difficult doing work/chores Not difficult at all Not difficult at all -    GAD 7 : Generalized Anxiety Score 04/16/2017 10/06/2016 07/02/2016  Nervous, Anxious, on Edge 0 1 1  Control/stop worrying 0 0 0  Worry too much - different things 0 0 0  Trouble relaxing 0 0 0  Restless 0 0 0  Easily annoyed or irritable - 0 0  Afraid - awful might happen 0 0 0  Total GAD 7 Score - 1 1  Anxiety Difficulty Not difficult at all Not difficult at all Not difficult at all     -------------------------------------------------------------------------- O: No physical exam performed due to remote telephone encounter.  Lab results reviewed.  Recent Results (from the past 2160 hour(s))  IgG     Status: None   Collection Time: 08/14/19  9:57 AM  Result Value Ref Range   IgG (Immunoglobin G), Serum 1,314 603 - 1,613 mg/dL    Comment: (NOTE) Performed At: [facility] [place], [place] [id number] Rush Farmer MD CZ:[id number]   Protein electrophoresis, serum     Status: Abnormal   Collection Time: 08/14/19  9:57 AM  Result Value Ref Range   Total Protein ELP  6.5 6.0 - 8.5 g/dL   Albumin ELP 3.6 2.9 - 4.4 g/dL   Alpha-1-Globulin 0.2 0.0 - 0.4 g/dL   Alpha-2-Globulin 0.5 0.4 - 1.0 g/dL   Beta Globulin 0.9 0.7 - 1.3 g/dL   Gamma Globulin 1.2 0.4 - 1.8 g/dL   M-Spike, % 0.6 (H) Not Observed g/dL   SPE Interp. Comment     Comment: (NOTE) The SPE pattern demonstrates a single peak (M-spike) in the gamma region which may represent monoclonal protein. This peak may also be caused by circulating immune complexes, cryoglobulins, C-reactive protein, fibrinogen or hemolysis.  If clinically indicated, the presence of a monoclonal gammopathy may be confirmed by immuno- fixation, as well as an evaluation of the urine for the presence of Bence-Jones protein. Performed At: [facility] [place], [place] [id number] Rush Farmer MD YQ:[id number]    Comment Comment     Comment: (NOTE) Protein electrophoresis scan will follow via computer, mail, or courier delivery.    Globulin, Total 2.9 2.2 - 3.9 g/dL   A/G Ratio 1.2 0.7 - 1.7  Comprehensive metabolic panel     Status: Abnormal   Collection Time: 08/14/19  9:57 AM  Result Value Ref Range   Sodium 138 135 - 145 mmol/L   Potassium 4.4 3.5 - 5.1 mmol/L   Chloride 104 98 - 111 mmol/L   CO2 27 22 - 32 mmol/L   Glucose, Bld 115 (H) 70 - 99 mg/dL    Comment: Glucose  reference range applies only to samples taken after fasting for at least 8 hours.   BUN 20 8 - 23 mg/dL   Creatinine, Ser 0.84 0.61 - 1.24 mg/dL   Calcium 8.8 (L) 8.9 - 10.3 mg/dL   Total Protein 7.0 6.5 - 8.1 g/dL   Albumin 3.9 3.5 - 5.0 g/dL   AST 34 15 - 41 U/L   ALT 28 0 - 44 U/L   Alkaline Phosphatase 69 38 - 126 U/L   Total Bilirubin 1.2 0.3 - 1.2 mg/dL   GFR calc non Af Amer >60 >60 mL/min   GFR calc Af Amer >60 >60 mL/min   Anion gap 7 5 - 15    Comment: Performed at [facility], [address] [place], [place] [address]  CBC with Differential/Platelet     Status: None   Collection Time: 08/14/19  9:57 AM  Result Value Ref Range   WBC 5.1 4.0 - 10.5 K/uL   RBC 4.27 4.22 - 5.81 MIL/uL   Hemoglobin 13.6 13.0 - 17.0 g/dL   HCT 40.8 39.0 - 52.0 %   MCV 95.6 80.0 - 100.0 fL   MCH 31.9 26.0 - 34.0 pg   MCHC 33.3 30.0 - 36.0 g/dL   RDW 12.7 11.5 - 15.5 %   Platelets 176 150 - 400 K/uL   nRBC 0.0 0.0 - 0.2 %   Neutrophils Relative % 64 %   Neutro Abs 3.3 1.7 - 7.7 K/uL   Lymphocytes Relative 14 %   Lymphs Abs 0.7 0.7 - 4.0 K/uL   Monocytes Relative 11 %   Monocytes Absolute 0.6 0.1 - 1.0 K/uL   Eosinophils Relative 10 %   Eosinophils Absolute 0.5 0.0 - 0.5 K/uL   Basophils Relative 1 %   Basophils Absolute 0.0 0.0 - 0.1 K/uL   Immature Granulocytes 0 %   Abs Immature Granulocytes 0.01 0.00 - 0.07 K/uL    Comment: Performed at [facility], [place],  [place] [address]    -------------------------------------------------------------------------- A&P:  Problem List Items Addressed This Visit    Constipation - Primary     Stable functional chronic constipation Seems may be secondary to Myrbetriq by his report Improved on bowel regimen but requesting additional advice today on dosing Miralax Discussed if fecal impaction can use higher dose MIralax 2+ at once until result Start using low dose Miralax 0.5 to 1 cap for prevention few times a  week when feeling good prior to onset more severe constipation   No orders of the defined types were placed in this encounter.   Follow-up: - Return as scheduled already  Patient verbalizes understanding with the above medical recommendations including the limitation of remote medical advice.  Specific follow-up and call-back criteria were given for patient to follow-up or seek medical care more urgently if needed.   - Time spent in direct consultation with patient on phone: 8 minutes   [name], [facility] 09/15/2019, 2:52 PM

## 2019-09-16 ENCOUNTER — Encounter: Payer: Self-pay | Admitting: Family Medicine

## 2019-09-18 NOTE — Progress Notes (Signed)
Evaluation Performed:  Follow-up visit  Date:  09/19/2019   ID:  James Cardenas, DOB 03/11/1941, MRN [id number]  Patient Location:  [address] [place] [address]   Provider location:   [facility], [place] office  PCP:  Olin Hauser, DO  Cardiologist:  Arvid Right [facility]  Chief Complaint  Patient presents with  . office visit    Pt has no complaints. Meds verbally reviewed w/ pt.     History of Present Illness:    James Cardenas is a 79 y.o. male  past medical history of Aortic valve stenosis , severe Anxiety GERD Essential hypertension Overactive bladder Peripheral neuropathy Carotid stenosis:  left ICA are consistent with a 40-59% in 09/2019 MGUS (monoclonal gammopathy of unknown significance) Who presents for follow-up of his severe aortic valve stenosis, TAVR, ,atrial flutter  Less active over the past few years Reports affected by peripheral neuropathy affecting his balance  cardiac catheterization on 07/28/2018 showing no significant coronary disease. Right heart pressures were normal.  successful TAVR in 09/2018 a47mm Edwards Sapien 3 THV via the TF approach on 09/13/2018.   Post operative echoshowed EF 55%, normally functioning TAVR with no PVL and mean gradient 19 mm Hg.He was discharged on aspirin and plavix.  1 month Echo 10/18/18 showed EF 55-60%, mean gradient 17 mmHG and no PVL.  On his last day of cardiac rehab noted to be in new onset atrial flutter with RVR underwent successful cardioversion on 01/20/2019.  elevated CHADS2VASc of at least 3 (HTN, age x 2)  He chose not to be on Eliquis/anticoagulation, reported having significant anxiety concerning the medication Reports prior history of depression  Has sold his house, has an appt, BP 478 to 295 systolic, 60 to 70 diastolic, HR 62Z Not on eliquis  Chronic LE edema trace to 1 + to mid shin  Other past medical history reviewed Cardiac catheterization  July 28, 2018 Left ventriculography: Left ventricular systolic function is normal, LVEF is estimated at 55-65%, there is no significant mitral regurgitation , no significant aortic valve stenosis  No significant coronary artery disease Unable to cross aortic valve for pressures or LV function Grossly normal right heart pressures  Echocardiogram 2015 performed through [facility] Moderate aortic valve stenosis peak gradient 44 mmHg, moderate aortic valve regurgitation, mild MR Normal ejection fraction EF greater than 55%   Past Medical History:  Diagnosis Date  . Anxiety   . Arthritis of knee   . Atypical atrial flutter ([place])    a. Dx 12/15/2018. CHA2DS2VASc 3-->eliquis.  . Carotid artery disease ([place])   . 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 Hearing loss    wears hearing aids  . Hypertension   . Neuropathy   . Nonobstructive Coronary artery disease    a. 07/2018 Cath: LMN nl, LAD/LCX/RCA no significant dzs. EF 55-65%.  . S/P TAVR (transcatheter aortic valve replacement)    a. 09/2018 TAVR: 26 mm Edwards Sapien THV via the TF approach; b. 10/2018 Echo: EF 55-60%. Nl RV fxn. RVSP 20.6mmHg. Mild BAE. No AI or perivalvular leak.  . Severe aortic stenosis    Past Surgical History:  Procedure Laterality Date  . CARDIOVERSION N/A 01/20/2019   Procedure: CARDIOVERSION;  Surgeon: Minna Merritts, MD;  Location: [facility] ORS;  Service: Cardiovascular;  Laterality: N/A;  . COLONOSCOPY    . EYE SURGERY Bilateral    cataract  . HERNIA REPAIR Left  Inguinal  . NECK SURGERY  2011   Cervical Fusion  . RIGHT HEART CATH AND CORONARY ANGIOGRAPHY N/A 07/28/2018   Procedure: RIGHT HEART CATH AND CORONARY ANGIOGRAPHY;  Surgeon: Minna Merritts, MD;  Location: [facility];  Service: Cardiovascular;  Laterality: N/A;  . TEE WITHOUT CARDIOVERSION N/A 09/13/2018   Procedure: TRANSESOPHAGEAL ECHOCARDIOGRAM (TEE);  Surgeon: Sherren Mocha, MD;  Location: [place];  Service: Open Heart Surgery;   Laterality: N/A;  . TRANSCATHETER AORTIC VALVE REPLACEMENT, TRANSFEMORAL  09/13/2018  . TRANSCATHETER AORTIC VALVE REPLACEMENT, TRANSFEMORAL N/A 09/13/2018   Procedure: TRANSCATHETER AORTIC VALVE REPLACEMENT, TRANSFEMORAL;  Surgeon: Sherren Mocha, MD;  Location: [place];  Service: Open Heart Surgery;  Laterality: N/A;     Current Meds  Medication Sig  . Alpha-Lipoic Acid 600 MG CAPS Take 600 mg by mouth every morning.   Marland Kitchen ALPRAZolam (XANAX) 0.5 MG tablet TAKE 1 TABLET BY MOUTH 2 TIMES A DAY AS NEEDED FOR ANXIETY  . amoxicillin (AMOXIL) 500 MG tablet Take 2,000 mg (4 pills) one hour prior to all dental visits. There are enough for two visits in this bottle.  Marland Kitchen aspirin EC 81 MG tablet Take 1 tablet (81 mg total) by mouth daily.  . Azelaic Acid 15 % cream Apply 1 application topically daily. After skin is thoroughly washed and patted dry, gently but thoroughly massage a thin film of azelaic acid cream into the affected area once daily  . Carboxymethylcellul-Glycerin (LUBRICATING EYE DROPS OP) Place 1 drop into both eyes 2 (two) times a day.   . cholecalciferol (VITAMIN D3) 25 MCG (1000 UT) tablet Take 1,000 Units by mouth daily.  Marland Kitchen losartan (COZAAR) 100 MG tablet Take 100 mg by mouth daily.  . meclizine (ANTIVERT) 25 MG tablet Take 1 tablet (25 mg total) by mouth 3 (three) times daily as needed for dizziness.  . mirabegron ER (MYRBETRIQ) 50 MG TB24 tablet Take 1 tablet (50 mg total) by mouth daily.  . Multiple Vitamin (MULTIVITAMIN WITH MINERALS) TABS tablet Take 1 tablet by mouth daily at 3 pm. Centrum  . OVER THE COUNTER MEDICATION Take 1 tablet by mouth at bedtime as needed (sleep). GNC PREVENTIVE NUTRITION TRI-SLEEP  . polyethylene glycol (MIRALAX / GLYCOLAX) packet Take 17 g by mouth 2 (two) times daily as needed (constipation).   . rosuvastatin (CRESTOR) 10 MG tablet Take 1 tablet (10 mg total) by mouth daily.  . sildenafil (REVATIO) 20 MG tablet Take up to 5 pills about 30 min prior to sex   . tretinoin (RETIN-A) 0.1 % cream Apply 1 application topically at bedtime.   . triamcinolone cream (KENALOG) 0.1 % APPLY TWICE DAILY TO AFFECTED RASH AS NEEDED    Allergies:   Nickel   Social History   Tobacco Use  . Smoking status: Never Smoker  . Smokeless tobacco: Never Used  Substance Use Topics  . Alcohol use: Yes    Alcohol/week: 7.0 standard drinks    Types: 1 Cans of beer, 6 Glasses of wine per week  . Drug use: No     Family Hx: The patient's family history includes Alzheimer's disease in his sister; Lung cancer in his father; Pancreatic cancer in his brother; Thyroid disease in his mother. There is no history of Prostate cancer, Bladder Cancer, or Kidney cancer.  ROS:   Please see the history of present illness.    Review of Systems  Constitutional: Negative.   HENT: Negative.   Respiratory: Negative.   Cardiovascular: Positive for leg swelling.  Gastrointestinal: Negative.   Musculoskeletal: Negative.   Neurological: Negative.   Psychiatric/Behavioral: Negative.   All other systems reviewed and are negative.   Labs/Other Tests and Data Reviewed:    Recent Labs: 12/16/2018: Magnesium 2.1; TSH 2.066 08/14/2019: ALT 28; BUN 20; Creatinine, Ser 0.84; Hemoglobin 13.6; Platelets 176; Potassium 4.4; Sodium 138   Recent Lipid Panel Lab Results  Component Value Date/Time   CHOL 146 11/24/2018 08:13 AM   CHOL 171 11/06/2013 12:00 AM   TRIG 48 11/24/2018 08:13 AM   TRIG 45 11/06/2013 12:00 AM   HDL 71 11/24/2018 08:13 AM   HDL 72 11/06/2013 12:00 AM   CHOLHDL 2.1 11/24/2018 08:13 AM   LDLCALC 61 11/24/2018 08:13 AM    Wt Readings from Last 3 Encounters:  09/19/19 196 lb (88.9 kg)  09/15/19 187 lb (84.8 kg)  09/14/19 187 lb (84.8 kg)     Exam:    Vital Signs: Vital signs may also be detailed in the HPI BP 138/72 (BP Location: Left Arm, Patient Position: Sitting, Cuff Size: Normal)   Pulse 65   Ht 6' 2.5" (1.892 m)   Wt 196 lb (88.9 kg)   SpO2 98%   BMI  24.83 kg/m    Constitutional:  oriented to person, place, and time. No distress.  HENT:  Head: Grossly normal Eyes:  no discharge. No scleral icterus.  Neck: No JVD, no carotid bruits  Cardiovascular: Regular rate and rhythm, no murmurs appreciated 1+ edema to mid shin Pulmonary/Chest: Clear to auscultation bilaterally, no wheezes or rails Abdominal: Soft.  no distension.  no tenderness.  Musculoskeletal: Normal range of motion Neurological:  normal muscle tone. Coordination normal. No atrophy Skin: Skin warm and dry Psychiatric: normal affect, pleasant   ASSESSMENT & PLAN:    Severe aortic stenosis Successful TAVR,  Follow-up echocardiogram, stable valve  Atrial flutter Had episode following valve surgery, converted to normal sinus rhythm after cardioversion Eliquis Long discussion was last clinic visit, did not want to stay on it for cost reasons, causing severe anxiety, He reported he was  "afraid to do anything".  Discussed options to track his arrhythmia as an outpatient, apple watch etc. Heart rate has been maintaining in the 60 beats per minute range  Anxiety Recommend he restart his exercise program Support /reassurance provided  Essential hypertension Blood pressure is well controlled on today's visit. No changes made to the medications.  MGUS (monoclonal gammopathy of unknown significance) Managed by primary care  Leg edema Stable, prefers not to be on lasix Suspect component of venous insufficiency Recommended compression hose   Total encounter time more than 25 minutes  Greater than 50% was spent in counseling and coordination of care with the patient   Signed, Ida Rogue, MD  09/19/2019 10:35 AM    [place] Office [address], [place], [place] [address]

## 2019-09-19 ENCOUNTER — Encounter: Payer: Self-pay | Admitting: Cardiovascular Disease

## 2019-09-19 ENCOUNTER — Other Ambulatory Visit: Payer: Self-pay

## 2019-09-19 ENCOUNTER — Ambulatory Visit (INDEPENDENT_AMBULATORY_CARE_PROVIDER_SITE_OTHER): Payer: Medicare Other | Admitting: Cardiovascular Disease

## 2019-09-19 VITALS — BP 138/72 | HR 65 | Ht 74.5 in | Wt 196.0 lb

## 2019-09-19 DIAGNOSIS — F329 Major depressive disorder, single episode, unspecified: Secondary | ICD-10-CM

## 2019-09-19 DIAGNOSIS — Z952 Presence of prosthetic heart valve: Secondary | ICD-10-CM

## 2019-09-19 DIAGNOSIS — F419 Anxiety disorder, unspecified: Secondary | ICD-10-CM | POA: Diagnosis not present

## 2019-09-19 DIAGNOSIS — I4891 Unspecified atrial fibrillation: Secondary | ICD-10-CM

## 2019-09-19 DIAGNOSIS — F32A Depression, unspecified: Secondary | ICD-10-CM

## 2019-09-19 DIAGNOSIS — I350 Nonrheumatic aortic (valve) stenosis: Secondary | ICD-10-CM

## 2019-09-19 DIAGNOSIS — I10 Essential (primary) hypertension: Secondary | ICD-10-CM | POA: Diagnosis not present

## 2019-09-19 NOTE — Patient Instructions (Signed)
Echo in 1 year for TAVR/aortic valve stenosis  Medication Instructions:  No changes  If you need a refill on your cardiac medications before your next appointment, please call your pharmacy.    Lab work: No new labs needed   If you have labs (blood work) drawn today and your tests are completely normal, you will receive your results only by: Marland Kitchen MyChart Message (if you have MyChart) OR . A paper copy in the mail If you have any lab test that is abnormal or we need to change your treatment, we will call you to review the results.   Testing/Procedures: No new testing needed   Follow-Up: At [facility], you and your health needs are our priority.  As part of our continuing mission to provide you with exceptional heart care, we have created designated Provider Care Teams.  These Care Teams include your primary Cardiologist (physician) and Advanced Practice Providers (APPs -  Physician Assistants and Nurse Practitioners) who all work together to provide you with the care you need, when you need it.  . You will need a follow up appointment in 12 months after echo   . Providers on your designated Care Team:   . Murray Hodgkins, NP . Christell Faith, PA-C . Marrianne Mood, PA-C  Any Other Special Instructions Will Be Listed Below (If Applicable).  For educational health videos Log in to : [email or web address] Or : [email or web address], password : [email or web address]

## 2019-09-20 ENCOUNTER — Ambulatory Visit: Payer: Medicare Other | Admitting: Family Medicine

## 2019-09-22 ENCOUNTER — Other Ambulatory Visit: Payer: Self-pay

## 2019-09-22 ENCOUNTER — Ambulatory Visit (INDEPENDENT_AMBULATORY_CARE_PROVIDER_SITE_OTHER): Payer: Medicare Other | Admitting: Family Medicine

## 2019-09-22 ENCOUNTER — Other Ambulatory Visit: Payer: Self-pay | Admitting: Family Medicine

## 2019-09-22 ENCOUNTER — Encounter: Payer: Self-pay | Admitting: Family Medicine

## 2019-09-22 VITALS — BP 138/75 | HR 67 | Temp 97.3°F | Resp 16 | Ht 75.0 in | Wt 194.0 lb

## 2019-09-22 DIAGNOSIS — M720 Palmar fascial fibromatosis [Dupuytren]: Secondary | ICD-10-CM

## 2019-09-22 DIAGNOSIS — R7309 Other abnormal glucose: Secondary | ICD-10-CM | POA: Diagnosis not present

## 2019-09-22 DIAGNOSIS — M159 Polyosteoarthritis, unspecified: Secondary | ICD-10-CM

## 2019-09-22 DIAGNOSIS — R350 Frequency of micturition: Secondary | ICD-10-CM

## 2019-09-22 DIAGNOSIS — Z952 Presence of prosthetic heart valve: Secondary | ICD-10-CM

## 2019-09-22 DIAGNOSIS — G609 Hereditary and idiopathic neuropathy, unspecified: Secondary | ICD-10-CM

## 2019-09-22 DIAGNOSIS — I7090 Unspecified atherosclerosis: Secondary | ICD-10-CM

## 2019-09-22 DIAGNOSIS — M8949 Other hypertrophic osteoarthropathy, multiple sites: Secondary | ICD-10-CM

## 2019-09-22 DIAGNOSIS — I10 Essential (primary) hypertension: Secondary | ICD-10-CM

## 2019-09-22 DIAGNOSIS — D472 Monoclonal gammopathy: Secondary | ICD-10-CM

## 2019-09-22 DIAGNOSIS — K5909 Other constipation: Secondary | ICD-10-CM | POA: Diagnosis not present

## 2019-09-22 DIAGNOSIS — N401 Enlarged prostate with lower urinary tract symptoms: Secondary | ICD-10-CM

## 2019-09-22 LAB — POCT GLYCOSYLATED HEMOGLOBIN (HGB A1C): Hemoglobin A1C: 5.8 % — AB (ref 4.0–5.6)

## 2019-09-22 NOTE — Progress Notes (Signed)
Subjective:    Patient ID: James Cardenas, male    DOB: 1940/08/30, 79 y.o.   MRN: [id number]  James Cardenas is a 79 y.o. male presenting on 09/22/2019 for Hypertension   HPI   Dupuytren's Contracture L Hand He has chronic problem for past several years. Previous [facility] advised no surgery until advanced. He has looked up non surgical therapy and asking about this. He says he will look into setting apt with [facility] Dr Peggye Ley  Constipation - much improved. Steady soft BMs. He has used high dose miralax as advised, will use smaller dose regularly in future  Elevated A1c Last lab A1c 5.6, previously 5.4 to 5.5 Today due for A1c Lifestyle changes Weight loss 10 lbs Significantly reduced all sugars in diet now He has good appetite  Additional info he has HCPOA Forms.   Depression screen [facility] 2/9 09/22/2019 11/29/2018 04/19/2018  Decreased Interest 0 0 0  Down, Depressed, Hopeless 0 0 0  PHQ - 2 Score 0 0 0  Altered sleeping - 0 0  Tired, decreased energy - 0 0  Change in appetite - 0 0  Feeling bad or failure about yourself  - 0 0  Trouble concentrating - 0 0  Moving slowly or fidgety/restless - 0 0  Suicidal thoughts - 0 0  PHQ-9 Score - 0 0  Difficult doing work/chores - Not difficult at all Not difficult at all   GAD 7 : Generalized Anxiety Score 04/16/2017 10/06/2016 07/02/2016  Nervous, Anxious, on Edge 0 1 1  Control/stop worrying 0 0 0  Worry too much - different things 0 0 0  Trouble relaxing 0 0 0  Restless 0 0 0  Easily annoyed or irritable - 0 0  Afraid - awful might happen 0 0 0  Total GAD 7 Score - 1 1  Anxiety Difficulty Not difficult at all Not difficult at all Not difficult at all     Social History   Tobacco Use  . Smoking status: Never Smoker  . Smokeless tobacco: Never Used  Vaping Use  . Vaping Use: Never used  Substance Use Topics  . Alcohol use: Yes    Alcohol/week: 7.0 standard drinks    Types: 1 Cans of beer, 6 Glasses of wine per week    . Drug use: No    Review of Systems Per HPI unless specifically indicated above     Objective:    BP 138/75   Pulse 67   Temp (!) 97.3 F (36.3 C) (Temporal)   Resp 16   Ht 6\' 3"  (1.905 m)   Wt 194 lb (88 kg)   SpO2 99%   BMI 24.25 kg/m   Wt Readings from Last 3 Encounters:  09/22/19 194 lb (88 kg)  09/19/19 196 lb (88.9 kg)  09/15/19 187 lb (84.8 kg)    Physical Exam Vitals and nursing note reviewed.  Constitutional:      General: He is not in acute distress.    Appearance: He is well-developed. He is not diaphoretic.     Comments: Well-appearing, comfortable, cooperative  HENT:     Head: Normocephalic and atraumatic.  Eyes:     General:        Right eye: No discharge.        Left eye: No discharge.     Conjunctiva/sclera: Conjunctivae normal.  Cardiovascular:     Rate and Rhythm: Normal rate.  Pulmonary:     Effort: Pulmonary effort is normal.  Skin:    General: Skin is warm and dry.     Findings: No erythema or rash.  Neurological:     Mental Status: He is alert and oriented to person, place, and time.  Psychiatric:        Behavior: Behavior normal.     Comments: Well groomed, good eye contact, normal speech and thoughts        Results for orders placed or performed in visit on 09/22/19  POCT HgB A1C  Result Value Ref Range   Hemoglobin A1C 5.8 (A) 4.0 - 5.6 %      Assessment & Plan:   Problem List Items Addressed This Visit    Elevated hemoglobin A1c - Primary    Stable PreDM overall, today initial A1c at 4.3 possibly machine error then repeat at 5.8 seems more consistent  Plan:  1. Not on any therapy currently  2. Encourage improved lifestyle - low carb, low sugar diet, reduce portion size, continue improving regular exercise  F/u 3 months lab A1c      Relevant Orders   POCT HgB A1C (Completed)   Dupuytren's contracture of left hand    Clinically consistent with Dupuytren's Flexion Contracture Left 4th finger Moderate severity still  has good range of motion and function  Does not seem to point of req surgical intervention He will pursue f/u with [facility] now with Dr Peggye Ley as he says they offer non surgical method, we can refer if needed      Constipation    Secondary to meds and Myrbetriq Improved now on adjusted dose Miralax. Use high dose for impaction/blockage and low dose regularly         No orders of the defined types were placed in this encounter.     Follow up plan: Return in about 3 months (around 12/23/2019) for Yearly Medicare Checkup.  Future labs ordered for 12/2019   [name], [facility] 09/22/2019, 2:57 PM

## 2019-09-22 NOTE — Patient Instructions (Addendum)
Thank you for coming to the office today.  Recent Labs    11/24/18 0813 09/22/19 1500  HGBA1C 5.6 5.8*    Keep up good work with diet.  Go ahead and schedule Dr Leretha Pol for dupuytren   DUE for FASTING BLOOD WORK (no food or drink after midnight before the lab appointment, only water or coffee without cream/sugar on the morning of)  SCHEDULE "Lab Only" visit in the morning at the clinic for lab draw in 3 MONTHS   - Make sure Lab Only appointment is at about 1 week before your next appointment, so that results will be available  For Lab Results, once available within 2-3 days of blood draw, you can can log in to MyChart online to view your results and a brief explanation. Also, we can discuss results at next follow-up visit.    Please schedule a Follow-up Appointment to: Return in about 3 months (around 12/23/2019) for Yearly Medicare Checkup.  If you have any other questions or concerns, please feel free to call the office or send a message through [place]. You may also schedule an earlier appointment if necessary.  Additionally, you may be receiving a survey about your experience at our office within a few days to 1 week by e-mail or mail. We value your feedback.  [name], DO [place]

## 2019-09-22 NOTE — Assessment & Plan Note (Signed)
Clinically consistent with Dupuytren's Flexion Contracture Left 4th finger Moderate severity still has good range of motion and function  Does not seem to point of req surgical intervention He will pursue f/u with [facility] now with Dr Peggye Ley as he says they offer non surgical method, we can refer if needed

## 2019-09-22 NOTE — Assessment & Plan Note (Signed)
Stable PreDM overall, today initial A1c at 4.3 possibly machine error then repeat at 5.8 seems more consistent  Plan:  1. Not on any therapy currently  2. Encourage improved lifestyle - low carb, low sugar diet, reduce portion size, continue improving regular exercise  F/u 3 months lab A1c

## 2019-09-22 NOTE — Assessment & Plan Note (Signed)
Secondary to meds and Myrbetriq Improved now on adjusted dose Miralax. Use high dose for impaction/blockage and low dose regularly

## 2019-10-04 ENCOUNTER — Other Ambulatory Visit: Payer: Self-pay | Admitting: Physician Assistant

## 2019-10-04 ENCOUNTER — Telehealth: Payer: Self-pay | Admitting: Cardiovascular Disease

## 2019-10-04 NOTE — Telephone Encounter (Signed)
All medication refills need to be sent to [facility] for future notice  no refills needed today

## 2019-10-04 NOTE — Telephone Encounter (Signed)
Pharmacy noted

## 2019-10-05 ENCOUNTER — Ambulatory Visit: Payer: Medicare Other | Admitting: Podiatry

## 2019-10-05 ENCOUNTER — Encounter: Payer: Self-pay | Admitting: Podiatry

## 2019-10-05 ENCOUNTER — Ambulatory Visit (INDEPENDENT_AMBULATORY_CARE_PROVIDER_SITE_OTHER): Payer: Medicare Other | Admitting: Podiatry

## 2019-10-05 ENCOUNTER — Other Ambulatory Visit: Payer: Self-pay

## 2019-10-05 DIAGNOSIS — M79676 Pain in unspecified toe(s): Secondary | ICD-10-CM

## 2019-10-05 DIAGNOSIS — G629 Polyneuropathy, unspecified: Secondary | ICD-10-CM

## 2019-10-05 DIAGNOSIS — B351 Tinea unguium: Secondary | ICD-10-CM

## 2019-10-05 NOTE — Progress Notes (Signed)
This patient returns to my office for at risk foot care.  This patient requires this care by a professional since this patient will be at risk due to having neuropathy.   This patient is unable to cut nails himself since the patient cannot reach his nails.These nails are painful walking and wearing shoes.  This patient presents for at risk foot care today.  General Appearance  Alert, conversant and in no acute stress.  Vascular  Dorsalis pedis and posterior tibial  pulses are palpable  bilaterally.  Capillary return is within normal limits  bilaterally. Temperature is within normal limits  bilaterally.  Neurologic  Senn-Weinstein monofilament wire test within normal limits  bilaterally. Muscle power within normal limits bilaterally.  Nails Thick disfigured discolored nails with subungual debris  from hallux to fifth toes bilaterally. No evidence of bacterial infection or drainage bilaterally.  Orthopedic  No limitations of motion  feet .  No crepitus or effusions noted.  No bony pathology or digital deformities noted.  Skin  normotropic skin with no porokeratosis noted bilaterally.  No signs of infections or ulcers noted.     Onychomycosis  Pain in right toes  Pain in left toes  Consent was obtained for treatment procedures.   Mechanical debridement of nails 1-5  bilaterally performed with a nail nipper.  Filed with dremel without incident.    Return office visit  10 weeks                    Told patient to return for periodic foot care and evaluation due to potential at risk complications.   Gardiner Barefoot DPM

## 2019-11-03 DIAGNOSIS — L570 Actinic keratosis: Secondary | ICD-10-CM | POA: Diagnosis not present

## 2019-11-03 DIAGNOSIS — D2262 Melanocytic nevi of left upper limb, including shoulder: Secondary | ICD-10-CM | POA: Diagnosis not present

## 2019-11-03 DIAGNOSIS — X32XXXA Exposure to sunlight, initial encounter: Secondary | ICD-10-CM | POA: Diagnosis not present

## 2019-11-03 DIAGNOSIS — Z85828 Personal history of other malignant neoplasm of skin: Secondary | ICD-10-CM | POA: Diagnosis not present

## 2019-11-03 DIAGNOSIS — Z85820 Personal history of malignant melanoma of skin: Secondary | ICD-10-CM | POA: Diagnosis not present

## 2019-11-03 DIAGNOSIS — D2271 Melanocytic nevi of right lower limb, including hip: Secondary | ICD-10-CM | POA: Diagnosis not present

## 2019-11-03 DIAGNOSIS — D225 Melanocytic nevi of trunk: Secondary | ICD-10-CM | POA: Diagnosis not present

## 2019-11-03 DIAGNOSIS — D2272 Melanocytic nevi of left lower limb, including hip: Secondary | ICD-10-CM | POA: Diagnosis not present

## 2019-11-10 ENCOUNTER — Other Ambulatory Visit: Payer: Self-pay | Admitting: Urology

## 2019-11-16 ENCOUNTER — Ambulatory Visit: Payer: Medicare Other | Admitting: Podiatry

## 2019-11-16 ENCOUNTER — Encounter: Payer: Self-pay | Admitting: Podiatry

## 2019-11-16 ENCOUNTER — Ambulatory Visit (INDEPENDENT_AMBULATORY_CARE_PROVIDER_SITE_OTHER): Payer: Medicare Other | Admitting: Podiatry

## 2019-11-16 ENCOUNTER — Other Ambulatory Visit: Payer: Self-pay

## 2019-11-16 DIAGNOSIS — M79676 Pain in unspecified toe(s): Secondary | ICD-10-CM

## 2019-11-16 DIAGNOSIS — L84 Corns and callosities: Secondary | ICD-10-CM

## 2019-11-16 DIAGNOSIS — G629 Polyneuropathy, unspecified: Secondary | ICD-10-CM

## 2019-11-16 DIAGNOSIS — B351 Tinea unguium: Secondary | ICD-10-CM

## 2019-11-16 NOTE — Progress Notes (Signed)
Patient presented to the office for nail care at six weeks.  Ammie told him he has returned before insurance would pay for the visit.  Therefore he chose to leave and return to the office at a later date.   Gardiner Barefoot DPM

## 2019-12-04 NOTE — Progress Notes (Signed)
Patient presented for nail care before the appropriate time.  Patient was informed and chose to reschedule his visit for nail care.   Gardiner Barefoot DPM

## 2019-12-07 ENCOUNTER — Ambulatory Visit: Payer: Medicare Other | Admitting: Podiatry

## 2019-12-11 ENCOUNTER — Ambulatory Visit (INDEPENDENT_AMBULATORY_CARE_PROVIDER_SITE_OTHER): Payer: Medicare Other | Admitting: Podiatry

## 2019-12-11 ENCOUNTER — Encounter: Payer: Self-pay | Admitting: Podiatry

## 2019-12-11 ENCOUNTER — Other Ambulatory Visit: Payer: Self-pay

## 2019-12-11 DIAGNOSIS — B351 Tinea unguium: Secondary | ICD-10-CM

## 2019-12-11 DIAGNOSIS — M79676 Pain in unspecified toe(s): Secondary | ICD-10-CM | POA: Diagnosis not present

## 2019-12-11 DIAGNOSIS — G629 Polyneuropathy, unspecified: Secondary | ICD-10-CM | POA: Diagnosis not present

## 2019-12-11 IMAGING — CR CHEST - 2 VIEW
2 series · 2 of 2 positions shown · non-contrast
Comparison: 10/18/2008.

CLINICAL DATA: Severe aortic stenosis, preop.

EXAM:
CHEST - 2 VIEW

[w chest pa]
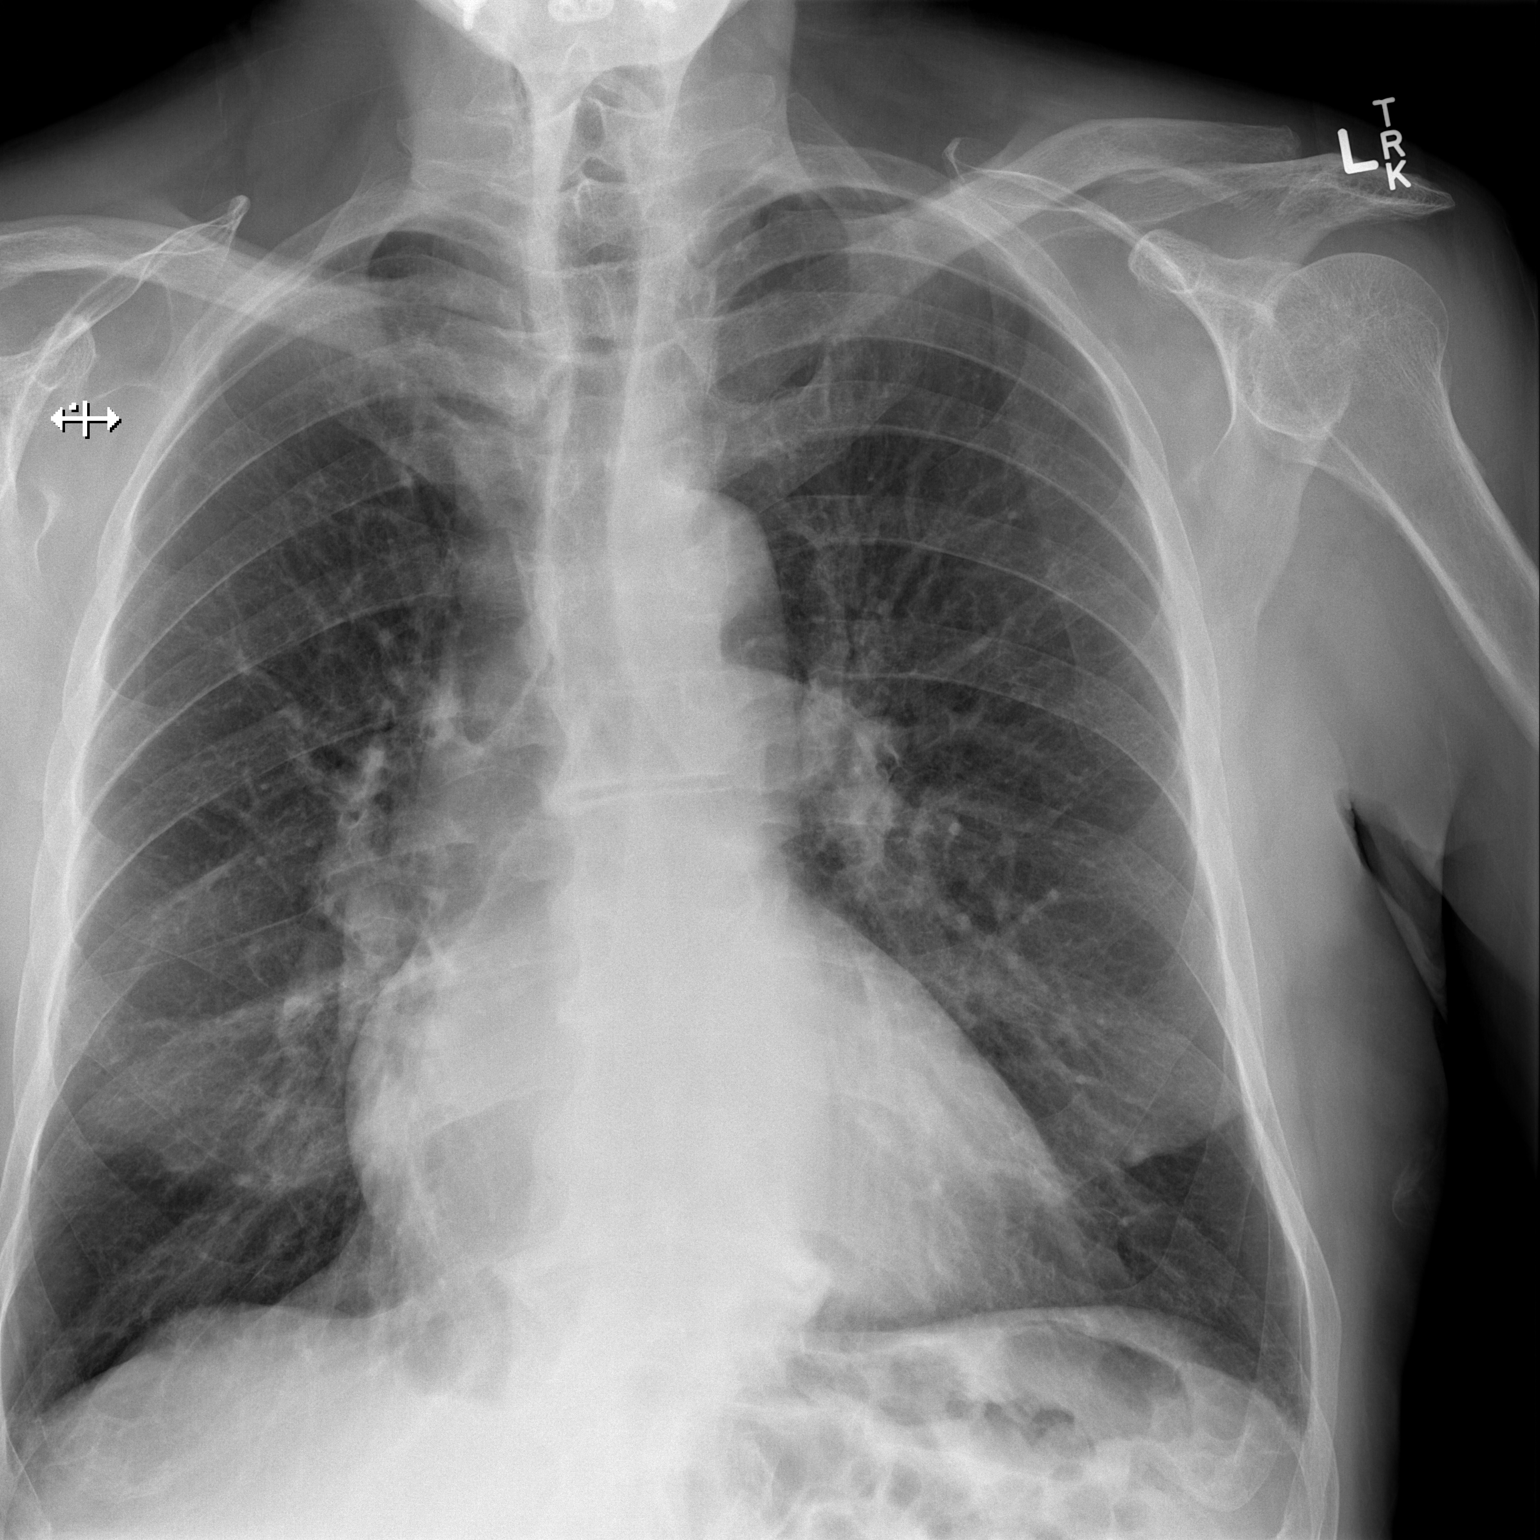

[w chest lat]
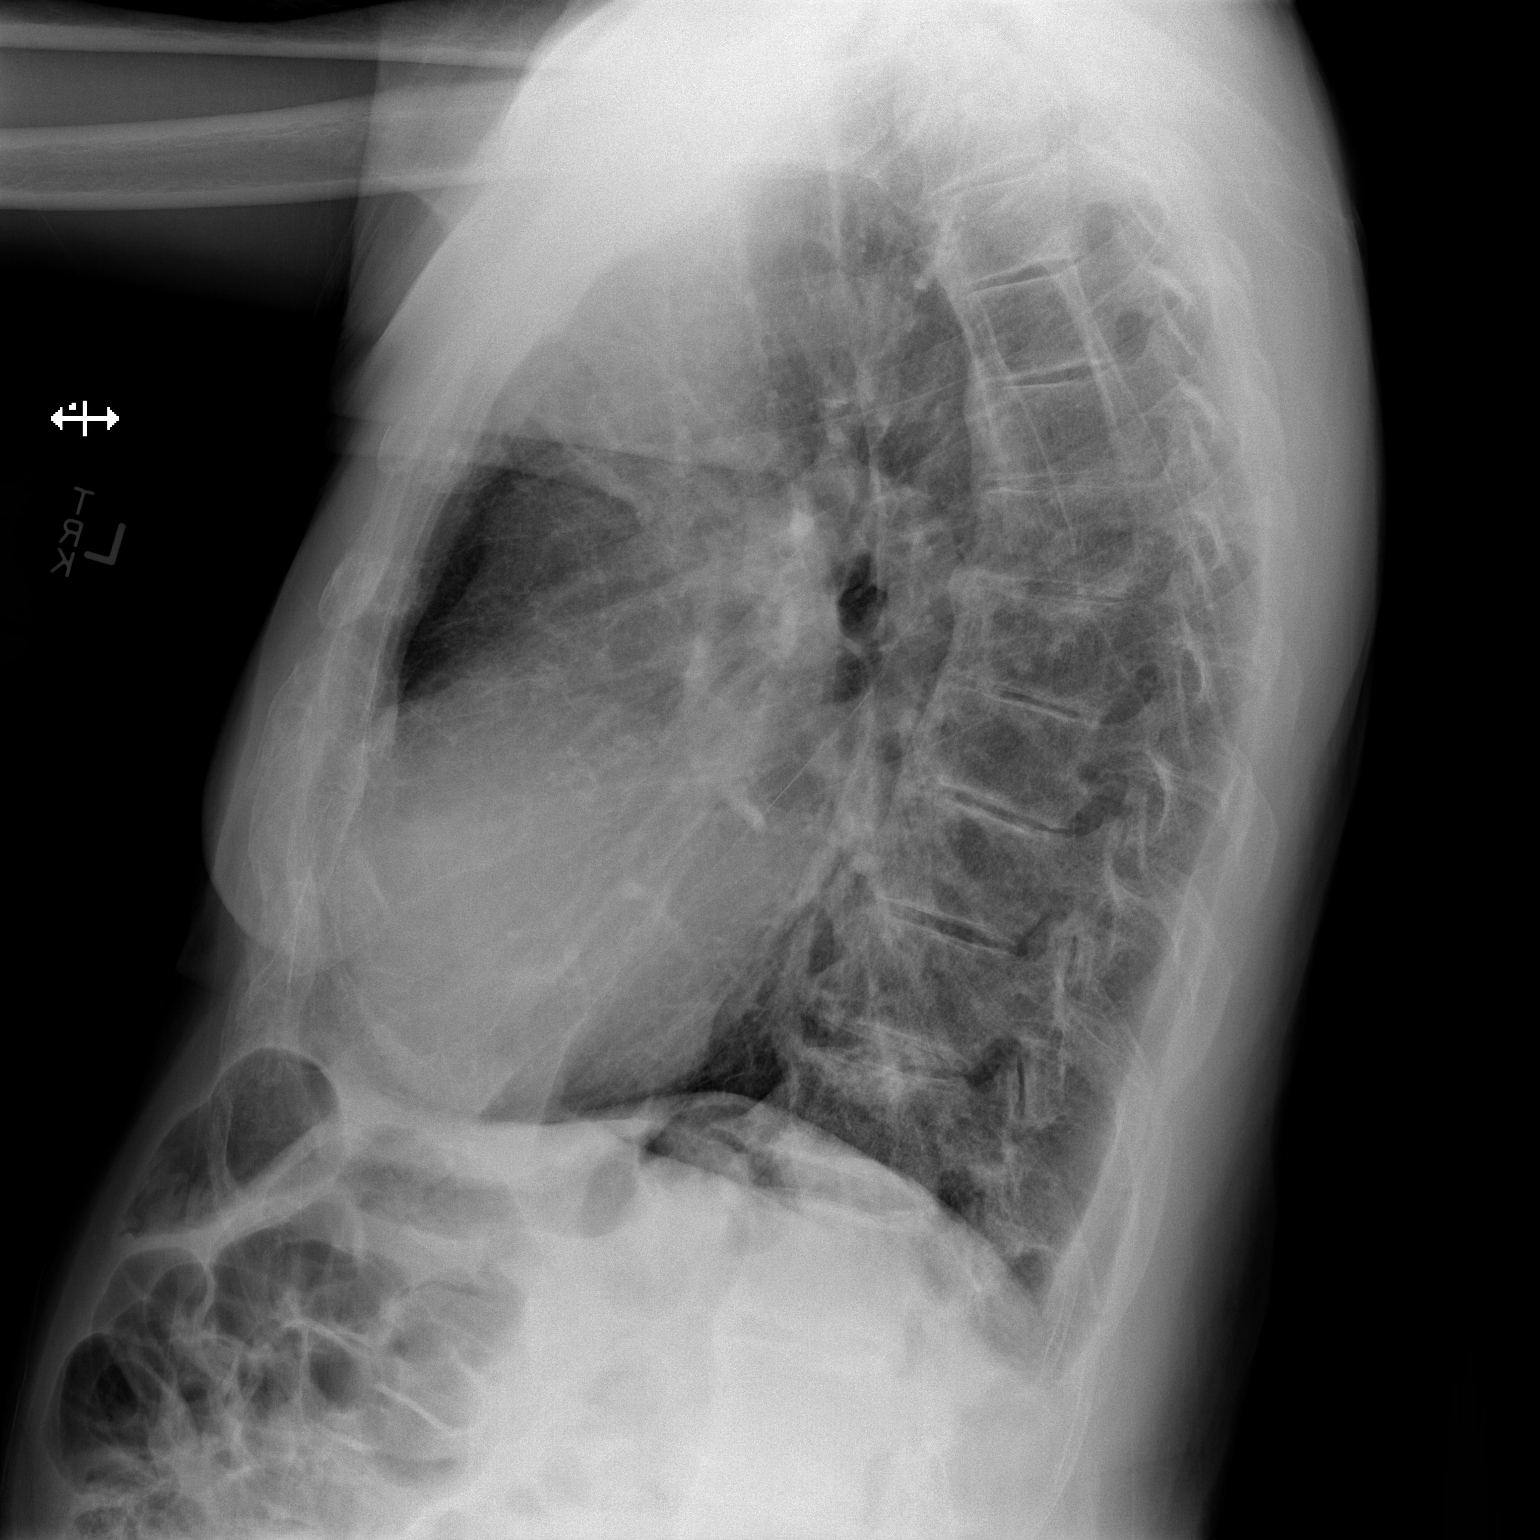

[2 of 2 positions shown; findings below may reference images not displayed]

FINDINGS: Mild cardiomegaly. Lungs are clear. No effusions. No acute bony
abnormality.
IMPRESSION: Mild cardiomegaly.  No active disease.

## 2019-12-11 NOTE — Progress Notes (Signed)
This patient returns to my office for at risk foot care.  This patient requires this care by a professional since this patient will be at risk due to having neuropathy.   This patient is unable to cut nails himself since the patient cannot reach his nails.These nails are painful walking and wearing shoes.  This patient presents for at risk foot care today.  General Appearance  Alert, conversant and in no acute stress.  Vascular  Dorsalis pedis and posterior tibial  pulses are palpable  bilaterally.  Capillary return is within normal limits  bilaterally. Temperature is within normal limits  bilaterally.  Neurologic  Senn-Weinstein monofilament wire test within normal limits  bilaterally. Muscle power within normal limits bilaterally.  Nails Thick disfigured discolored nails with subungual debris  from hallux to fifth toes bilaterally. No evidence of bacterial infection or drainage bilaterally.  Orthopedic  No limitations of motion  feet .  No crepitus or effusions noted.  No bony pathology or digital deformities noted.  Skin  normotropic skin with no porokeratosis noted bilaterally.  No signs of infections or ulcers noted.     Onychomycosis  Pain in right toes  Pain in left toes  Consent was obtained for treatment procedures.   Mechanical debridement of nails 1-5  bilaterally performed with a nail nipper.  Filed with dremel without incident. Discussed nail surgery in future.  Iatrogenic lesion medial border fourth toe left foot.   Return office visit  9 weeks                    Told patient to return for periodic foot care and evaluation due to potential at risk complications.   Gardiner Barefoot DPM

## 2019-12-15 IMAGING — DX PORTABLE CHEST - 1 VIEW
1 series · 1 of 1 positions shown · non-contrast
Comparison: 09/09/2018

CLINICAL DATA: Status post TAVR

EXAM:
PORTABLE CHEST 1 VIEW

[chest ap]
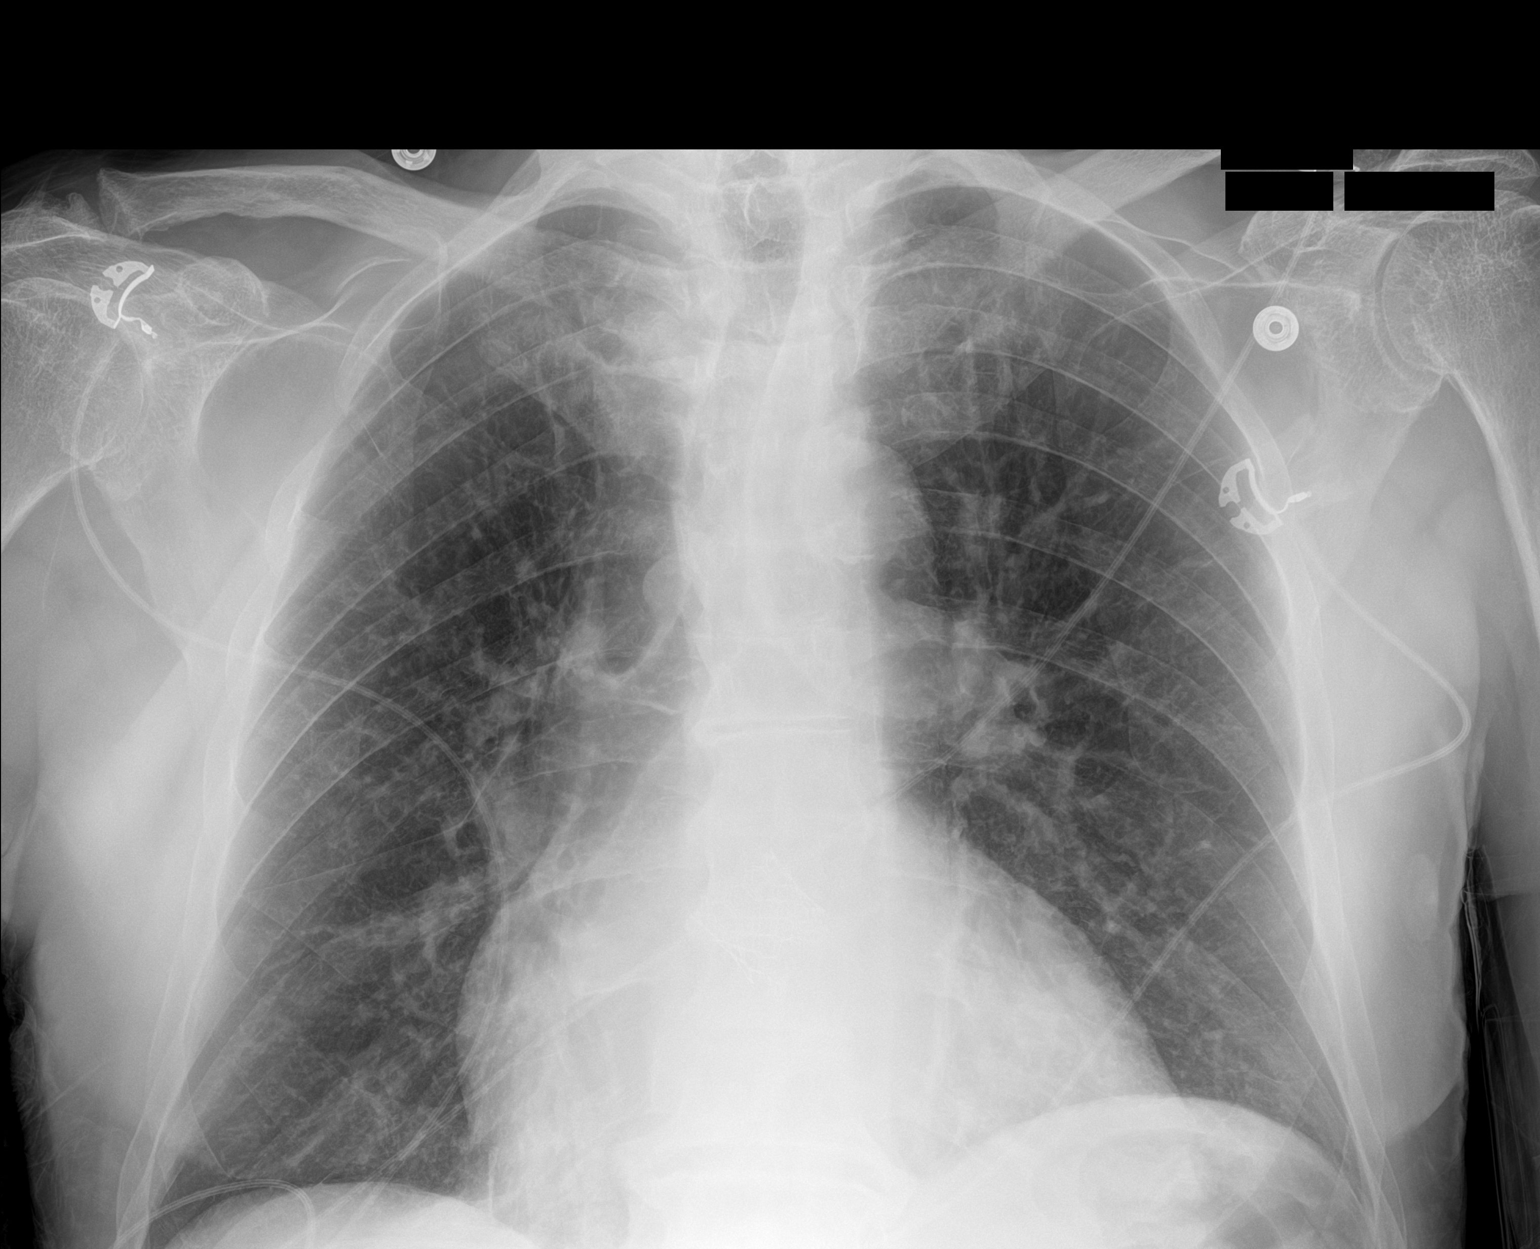

[1 of 1 positions shown; findings below may reference images not displayed]

FINDINGS: There are changes consistent with interval TAVR placement. Lungs are
well aerated bilaterally. No focal infiltrate or effusion is seen.
No bony abnormality is noted.
IMPRESSION: Status post TAVR.  No acute abnormality noted.

## 2020-01-18 DIAGNOSIS — Z23 Encounter for immunization: Secondary | ICD-10-CM | POA: Diagnosis not present

## 2020-01-23 ENCOUNTER — Ambulatory Visit (INDEPENDENT_AMBULATORY_CARE_PROVIDER_SITE_OTHER): Payer: Medicare Other

## 2020-01-23 VITALS — Ht 75.0 in | Wt 187.0 lb

## 2020-01-23 DIAGNOSIS — Z0000 Encounter for general adult medical examination without abnormal findings: Secondary | ICD-10-CM

## 2020-01-23 NOTE — Patient Instructions (Signed)
Mr. James Cardenas , Thank you for taking time to come for your Medicare Wellness Visit. I appreciate your ongoing commitment to your health goals. Please review the following plan we discussed and let me know if I can assist you in the future.   Screening recommendations/referrals: Colonoscopy: not required Recommended yearly ophthalmology/optometry visit for glaucoma screening and checkup Recommended yearly dental visit for hygiene and checkup  Vaccinations: Influenza vaccine: completed 01/19/2020 Pneumococcal vaccine: completed 12/31/2017 Tdap vaccine: completed 08/01/2010 Shingles vaccine: discussed   Covid-19:  06/06/2019, 05/03/2019  Advanced directives: Please bring a copy of your POA (Power of Attorney) and/or Living Will to your next appointment.   Conditions/risks identified: none  Next appointment: Follow up in one year for your annual wellness visit.   Preventive Care 22 Years and Older, Male Preventive care refers to lifestyle choices and visits with your health care provider that can promote health and wellness. What does preventive care include?  A yearly physical exam. This is also called an annual well check.  Dental exams once or twice a year.  Routine eye exams. Ask your health care provider how often you should have your eyes checked.  Personal lifestyle choices, including:  Daily care of your teeth and gums.  Regular physical activity.  Eating a healthy diet.  Avoiding tobacco and drug use.  Limiting alcohol use.  Practicing safe sex.  Taking low doses of aspirin every day.  Taking vitamin and mineral supplements as recommended by your health care provider. What happens during an annual well check? The services and screenings done by your health care provider during your annual well check will depend on your age, overall health, lifestyle risk factors, and family history of disease. Counseling  Your health care provider may ask you questions about  your:  Alcohol use.  Tobacco use.  Drug use.  Emotional well-being.  Home and relationship well-being.  Sexual activity.  Eating habits.  History of falls.  Memory and ability to understand (cognition).  Work and work Statistician. Screening  You may have the following tests or measurements:  Height, weight, and BMI.  Blood pressure.  Lipid and cholesterol levels. These may be checked every 5 years, or more frequently if you are over 77 years old.  Skin check.  Lung cancer screening. You may have this screening every year starting at age 81 if you have a 30-pack-year history of smoking and currently smoke or have quit within the past 15 years.  Fecal occult blood test (FOBT) of the stool. You may have this test every year starting at age 36.  Flexible sigmoidoscopy or colonoscopy. You may have a sigmoidoscopy every 5 years or a colonoscopy every 10 years starting at age 38.  Prostate cancer screening. Recommendations will vary depending on your family history and other risks.  Hepatitis C blood test.  Hepatitis B blood test.  Sexually transmitted disease (STD) testing.  Diabetes screening. This is done by checking your blood sugar (glucose) after you have not eaten for a while (fasting). You may have this done every 1-3 years.  Abdominal aortic aneurysm (AAA) screening. You may need this if you are a current or former smoker.  Osteoporosis. You may be screened starting at age 53 if you are at high risk. Talk with your health care provider about your test results, treatment options, and if necessary, the need for more tests. Vaccines  Your health care provider may recommend certain vaccines, such as:  Influenza vaccine. This is recommended every year.  Tetanus, diphtheria, and acellular pertussis (Tdap, Td) vaccine. You may need a Td booster every 10 years.  Zoster vaccine. You may need this after age 3.  Pneumococcal 13-valent conjugate (PCV13) vaccine.  One dose is recommended after age 82.  Pneumococcal polysaccharide (PPSV23) vaccine. One dose is recommended after age 60. Talk to your health care provider about which screenings and vaccines you need and how often you need them. This information is not intended to replace advice given to you by your health care provider. Make sure you discuss any questions you have with your health care provider. Document Released: 04/26/2015 Document Revised: 12/18/2015 Document Reviewed: 01/29/2015 Elsevier Interactive Patient Education  2017 [place] Prevention in the Home Falls can cause injuries. They can happen to people of all ages. There are many things you can do to make your home safe and to help prevent falls. What can I do on the outside of my home?  Regularly fix the edges of walkways and driveways and fix any cracks.  Remove anything that might make you trip as you walk through a door, such as a raised step or threshold.  Trim any bushes or trees on the path to your home.  Use bright outdoor lighting.  Clear any walking paths of anything that might make someone trip, such as rocks or tools.  Regularly check to see if handrails are loose or broken. Make sure that both sides of any steps have handrails.  Any raised decks and porches should have guardrails on the edges.  Have any leaves, snow, or ice cleared regularly.  Use sand or salt on walking paths during winter.  Clean up any spills in your garage right away. This includes oil or grease spills. What can I do in the bathroom?  Use night lights.  Install grab bars by the toilet and in the tub and shower. Do not use towel bars as grab bars.  Use non-skid mats or decals in the tub or shower.  If you need to sit down in the shower, use a plastic, non-slip stool.  Keep the floor dry. Clean up any water that spills on the floor as soon as it happens.  Remove soap buildup in the tub or shower regularly.  Attach bath  mats securely with double-sided non-slip rug tape.  Do not have throw rugs and other things on the floor that can make you trip. What can I do in the bedroom?  Use night lights.  Make sure that you have a light by your bed that is easy to reach.  Do not use any sheets or blankets that are too big for your bed. They should not hang down onto the floor.  Have a firm chair that has side arms. You can use this for support while you get dressed.  Do not have throw rugs and other things on the floor that can make you trip. What can I do in the kitchen?  Clean up any spills right away.  Avoid walking on wet floors.  Keep items that you use a lot in easy-to-reach places.  If you need to reach something above you, use a strong step stool that has a grab bar.  Keep electrical cords out of the way.  Do not use floor polish or wax that makes floors slippery. If you must use wax, use non-skid floor wax.  Do not have throw rugs and other things on the floor that can make you trip. What can I do  with my stairs?  Do not leave any items on the stairs.  Make sure that there are handrails on both sides of the stairs and use them. Fix handrails that are broken or loose. Make sure that handrails are as long as the stairways.  Check any carpeting to make sure that it is firmly attached to the stairs. Fix any carpet that is loose or worn.  Avoid having throw rugs at the top or bottom of the stairs. If you do have throw rugs, attach them to the floor with carpet tape.  Make sure that you have a light switch at the top of the stairs and the bottom of the stairs. If you do not have them, ask someone to add them for you. What else can I do to help prevent falls?  Wear shoes that:  Do not have high heels.  Have rubber bottoms.  Are comfortable and fit you well.  Are closed at the toe. Do not wear sandals.  If you use a stepladder:  Make sure that it is fully opened. Do not climb a closed  stepladder.  Make sure that both sides of the stepladder are locked into place.  Ask someone to hold it for you, if possible.  Clearly mark and make sure that you can see:  Any grab bars or handrails.  First and last steps.  Where the edge of each step is.  Use tools that help you move around (mobility aids) if they are needed. These include:  Canes.  Walkers.  Scooters.  Crutches.  Turn on the lights when you go into a dark area. Replace any light bulbs as soon as they burn out.  Set up your furniture so you have a clear path. Avoid moving your furniture around.  If any of your floors are uneven, fix them.  If there are any pets around you, be aware of where they are.  Review your medicines with your doctor. Some medicines can make you feel dizzy. This can increase your chance of falling. Ask your doctor what other things that you can do to help prevent falls. This information is not intended to replace advice given to you by your health care provider. Make sure you discuss any questions you have with your health care provider. Document Released: 01/24/2009 Document Revised: 09/05/2015 Document Reviewed: 05/04/2014 Elsevier Interactive Patient Education  2017 [employer].

## 2020-01-23 NOTE — Progress Notes (Signed)
I connected with [name] today by telephone and verified that I am speaking with the correct person using two identifiers. Location patient: home Location provider: work Persons participating in the virtual visit: James Cardenas, [name].   I discussed the limitations, risks, security and privacy concerns of performing an evaluation and management service by telephone and the availability of in person appointments. I also discussed with the patient that there may be a patient responsible charge related to this service. The patient expressed understanding and verbally consented to this telephonic visit.    Interactive audio and video telecommunications were attempted between this provider and patient, however failed, due to patient having technical difficulties OR patient did not have access to video capability.  We continued and completed visit with audio only.     Vital signs may be patient reported or missing.  Subjective:   James Cardenas is a 79 y.o. male who presents for Medicare Annual/Subsequent preventive examination.  Review of Systems     Cardiac Risk Factors include: advanced age (>75men, >69 women);hypertension;male gender     Objective:    Today's Vitals   01/23/20 1136  Weight: 187 lb (84.8 kg)  Height: 6\' 3"  (1.905 m)   Body mass index is 23.37 kg/m.  Advanced Directives 01/23/2020 08/14/2019 01/20/2019 01/20/2019 12/15/2018 12/15/2018 09/13/2018  Does Patient Have a Medical Advance Directive? Yes Yes Yes Yes Yes No Yes  Type of Paramedic of [place];Living will Living will;Healthcare Power of [place];Living will Healthcare Power of [place];Living will - [place];Living will  Does patient want to make changes to medical advance directive? - - - No - Patient declined No - Patient declined - No - Patient declined  Copy of [place] in Chart?  No - copy requested No - copy requested - No - copy requested No - copy requested - No - copy requested    Current Medications (verified) Outpatient Encounter Medications as of 01/23/2020  Medication Sig  . Alpha-Lipoic Acid 600 MG CAPS Take 600 mg by mouth every morning.   Marland Kitchen ALPRAZolam (XANAX) 0.5 MG tablet TAKE 1 TABLET BY MOUTH 2 TIMES A DAY AS NEEDED FOR ANXIETY  . amoxicillin (AMOXIL) 500 MG tablet Take 2,000 mg (4 pills) one hour prior to all dental visits. There are enough for two visits in this bottle.  Marland Kitchen aspirin EC 81 MG tablet Take 1 tablet (81 mg total) by mouth daily.  . Azelaic Acid 15 % cream Apply 1 application topically daily. After skin is thoroughly washed and patted dry, gently but thoroughly massage a thin film of azelaic acid cream into the affected area once daily  . Carboxymethylcellul-Glycerin (LUBRICATING EYE DROPS OP) Place 1 drop into both eyes 2 (two) times a day.   . cholecalciferol (VITAMIN D3) 25 MCG (1000 UT) tablet Take 1,000 Units by mouth daily.  Marland Kitchen losartan (COZAAR) 100 MG tablet Take 100 mg by mouth daily.  . Multiple Vitamin (MULTIVITAMIN WITH MINERALS) TABS tablet Take 1 tablet by mouth daily at 3 pm. Centrum  . MYRBETRIQ 50 MG TB24 tablet TAKE 1 TABLET BY MOUTH EVERY DAY  . OVER THE COUNTER MEDICATION Take 1 tablet by mouth at bedtime as needed (sleep). GNC PREVENTIVE NUTRITION TRI-SLEEP  . polyethylene glycol (MIRALAX / GLYCOLAX) packet Take 17 g by mouth 2 (two) times daily as needed (constipation).   . rosuvastatin (CRESTOR) 10 MG tablet TAKE 1 TABLET  BY MOUTH EVERY DAY  . triamcinolone cream (KENALOG) 0.1 % APPLY TWICE DAILY TO AFFECTED RASH AS NEEDED  . meclizine (ANTIVERT) 25 MG tablet Take 1 tablet (25 mg total) by mouth 3 (three) times daily as needed for dizziness. (Patient not taking: Reported on 01/23/2020)  . sildenafil (REVATIO) 20 MG tablet Take up to 5 pills about 30 min prior to sex (Patient not taking: Reported on 01/23/2020)  . tretinoin  (RETIN-A) 0.1 % cream Apply 1 application topically at bedtime.  (Patient not taking: Reported on 01/23/2020)   No facility-administered encounter medications on file as of 01/23/2020.    Allergies (verified) Nickel   History: Past Medical History:  Diagnosis Date  . Anxiety   . Arthritis of knee   . Atypical atrial flutter ([place])    a. Dx 12/15/2018. CHA2DS2VASc 3-->eliquis.  . Carotid artery disease ([place])   . 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 Hearing loss    wears hearing aids  . Hypertension   . Neuropathy   . Nonobstructive Coronary artery disease    a. 07/2018 Cath: LMN nl, LAD/LCX/RCA no significant dzs. EF 55-65%.  . S/P TAVR (transcatheter aortic valve replacement)    a. 09/2018 TAVR: 26 mm Edwards Sapien THV via the TF approach; b. 10/2018 Echo: EF 55-60%. Nl RV fxn. RVSP 20.34mmHg. Mild BAE. No AI or perivalvular leak.  . Severe aortic stenosis    Past Surgical History:  Procedure Laterality Date  . CARDIOVERSION N/A 01/20/2019   Procedure: CARDIOVERSION;  Surgeon: Minna Merritts, MD;  Location: [facility];  Service: Cardiovascular;  Laterality: N/A;  . COLONOSCOPY    . EYE SURGERY Bilateral    cataract  . HERNIA REPAIR Left    Inguinal  . NECK SURGERY  2011   Cervical Fusion  . RIGHT HEART CATH AND CORONARY ANGIOGRAPHY N/A 07/28/2018   Procedure: RIGHT HEART CATH AND CORONARY ANGIOGRAPHY;  Surgeon: Minna Merritts, MD;  Location: [place] [facility];  Service: Cardiovascular;  Laterality: N/A;  . TEE WITHOUT CARDIOVERSION N/A 09/13/2018   Procedure: TRANSESOPHAGEAL ECHOCARDIOGRAM (TEE);  Surgeon: Sherren Mocha, MD;  Location: [place];  Service: Open Heart Surgery;  Laterality: N/A;  . TRANSCATHETER AORTIC VALVE REPLACEMENT, TRANSFEMORAL  09/13/2018  . TRANSCATHETER AORTIC VALVE REPLACEMENT, TRANSFEMORAL N/A 09/13/2018   Procedure: TRANSCATHETER AORTIC VALVE REPLACEMENT, TRANSFEMORAL;  Surgeon: Sherren Mocha, MD;  Location: [place];  Service: Open Heart Surgery;   Laterality: N/A;   Family History  Problem Relation Age of Onset  . Thyroid disease Mother   . Lung cancer Father   . Pancreatic cancer Brother   . Alzheimer's disease Sister   . Prostate cancer Neg Hx   . Bladder Cancer Neg Hx   . Kidney cancer Neg Hx    Social History   Socioeconomic History  . Marital status: Divorced    Spouse name: Not on file  . Number of children: Not on file  . Years of education: Not on file  . Highest education level: Bachelor's degree (e.g., BA, AB, BS)  Occupational History  . Occupation: Retired Designer, multimedia, Public affairs consultant / heat treating)  Tobacco Use  . Smoking status: Never Smoker  . Smokeless tobacco: Never Used  Vaping Use  . Vaping Use: Never used  Substance and Sexual Activity  . Alcohol use: Yes    Alcohol/week: 7.0 standard drinks    Types: 1 Cans of beer, 6 Glasses of wine per week  . Drug use: No  .  Sexual activity: Yes  Other Topics Concern  . Not on file  Social History Narrative  . Not on file   Social Determinants of Health   Financial Resource Strain: Low Risk   . Difficulty of Paying Living Expenses: Not hard at all  Food Insecurity: No Food Insecurity  . Worried About Charity fundraiser in the Last Year: Never true  . Ran Out of Food in the Last Year: Never true  Transportation Needs: No Transportation Needs  . Lack of Transportation (Medical): No  . Lack of Transportation (Non-Medical): No  Physical Activity: Inactive  . Days of Exercise per Week: 0 days  . Minutes of Exercise per Session: 0 min  Stress: No Stress Concern Present  . Feeling of Stress : Not at all  Social Connections:   . Frequency of Communication with Friends and Family: Not on file  . Frequency of Social Gatherings with Friends and Family: Not on file  . Attends Religious Services: Not on file  . Active Member of Clubs or Organizations: Not on file  . Attends Archivist Meetings: Not on file  . Marital Status: Not on file     Tobacco Counseling Counseling given: Not Answered   Clinical Intake:  Pre-visit preparation completed: Yes  Pain : No/denies pain     Nutritional Status: BMI of 19-24  Normal Nutritional Risks: None Diabetes: No  How often do you need to have someone help you when you read instructions, pamphlets, or other written materials from your doctor or pharmacy?: 1 - Never What is the last grade level you completed in school?: bachelor science  Diabetic? no  Interpreter Needed?: No  Information entered by :: NAllen LPN   Activities of Daily Living In your present state of health, do you have any difficulty performing the following activities: 01/23/2020  Hearing? N  Comment trouble with high pitch sounds  Vision? N  Difficulty concentrating or making decisions? N  Walking or climbing stairs? N  Dressing or bathing? N  Doing errands, shopping? N  Preparing Food and eating ? N  Using the Toilet? N  In the past six months, have you accidently leaked urine? Y  Do you have problems with loss of bowel control? N  Managing your Medications? N  Managing your Finances? N  Housekeeping or managing your Housekeeping? N  Some recent data might be hidden    Patient Care Team: Olin Hauser, DO as PCP - General (Family Medicine) Minna Merritts, MD as PCP - Cardiology (Cardiology) Renata Caprice as Physician Assistant (Orthopedic Surgery) Nickie Retort, MD as Consulting Physician (Urology) Gardiner Barefoot, DPM as Consulting Physician (Podiatry) Stark Klein Bing Neighbors, NP as Nurse Practitioner (Neurology)  Indicate any recent Medical Services you may have received from other than [facility] providers in the past year (date may be approximate).     Assessment:   This is a routine wellness examination for James Cardenas.  Hearing/Vision screen  Hearing Screening   125Hz  250Hz  500Hz  1000Hz  2000Hz  3000Hz  4000Hz  6000Hz  8000Hz   Right ear:           Left ear:            Vision Screening Comments: Regular eye exams, [facility]  Dietary issues and exercise activities discussed: Current Exercise Habits: The patient does not participate in regular exercise at present (wants to get back to regular routine)  Goals    . Increase water intake     Recommend drinking  at least 6-8 glasses of water a day     . Patient Stated     01/23/2020, wants to get back to exercise routine      Depression Screen PHQ 2/9 Scores 01/23/2020 09/22/2019 11/29/2018 04/19/2018 04/19/2018 11/23/2017 10/12/2017  PHQ - 2 Score 0 0 0 0 0 0 0  PHQ- 9 Score - - 0 0 - - -    Fall Risk Fall Risk  01/23/2020 09/22/2019 11/29/2018 10/18/2018 04/19/2018  Falls in the past year? 0 0 0 0 0  Number falls in past yr: - 0 0 0 -  Injury with Fall? - 0 0 - -  Risk for fall due to : Medication side effect - - - -  Follow up Falls evaluation completed;Education provided;Falls prevention discussed Falls evaluation completed - - Falls evaluation completed    Any stairs in or around the home? No  If so, are there any without handrails? n/a Home free of loose throw rugs in walkways, pet beds, electrical cords, etc? Yes  Adequate lighting in your home to reduce risk of falls? Yes   ASSISTIVE DEVICES UTILIZED TO PREVENT FALLS:  Life alert? No  Use of a cane, walker or w/c? No  Grab bars in the bathroom? Yes  Shower chair or bench in shower? No  Elevated toilet seat or a handicapped toilet? No   TIMED UP AND GO:  Was the test performed? No . .    Cognitive Function:     6CIT Screen 01/23/2020 11/23/2017 09/29/2016  What Year? 0 points 0 points 0 points  What month? 0 points 0 points 0 points  What time? 0 points 0 points 0 points  Count back from 20 0 points 0 points 0 points  Months in reverse 2 points 0 points 0 points  Repeat phrase 2 points 2 points 0 points  Total Score 4 2 0    Immunizations Immunization History  Administered Date(s) Administered  . Influenza, High Dose  Seasonal PF 02/12/2017, 12/31/2017  . Influenza,inj,Quad PF,6+ Mos 01/05/2019  . Influenza-Unspecified 11/12/2015, 01/19/2020  . PFIZER SARS-COV-2 Vaccination 05/03/2019, 06/06/2019  . Pneumococcal Conjugate-13 07/02/2016, 12/31/2017  . Pneumococcal Polysaccharide-23 05/24/2008  . Tetanus 08/01/2010  . Zoster 04/13/2009    TDAP status: Up to date Flu Vaccine status: Up to date Pneumococcal vaccine status: Up to date Covid-19 vaccine status: Completed vaccines  Qualifies for Shingles Vaccine? Yes   Zostavax completed Yes   Shingrix Completed?: No.    Education has been provided regarding the importance of this vaccine. Patient has been advised to call insurance company to determine out of pocket expense if they have not yet received this vaccine. Advised may also receive vaccine at local pharmacy or Health Dept. Verbalized acceptance and understanding.  Screening Tests Health Maintenance  Topic Date Due  . TETANUS/TDAP  07/31/2020  . INFLUENZA VACCINE  Completed  . COVID-19 Vaccine  Completed  . Hepatitis C Screening  Completed  . PNA vac Low Risk Adult  Completed    Health Maintenance  There are no preventive care reminders to display for this patient.  Colorectal cancer screening: No longer required.   Lung Cancer Screening: (Low Dose CT Chest recommended if Age 23-80 years, 30 pack-year currently smoking OR have quit w/in 15years.) does not qualify.   Lung Cancer Screening Referral: no   Additional Screening:  Hepatitis C Screening: does qualify; Completed 07/25/2018  Vision Screening: Recommended annual ophthalmology exams for early detection of glaucoma and other disorders of the  eye. Is the patient up to date with their annual eye exam?  Yes  Who is the provider or what is the name of the office in which the patient attends annual eye exams? [facility] If pt is not established with a provider, would they like to be referred to a provider to establish care?  No .   Dental Screening: Recommended annual dental exams for proper oral hygiene  Community Resource Referral / Chronic Care Management: CRR required this visit?  No   CCM required this visit?  No      Plan:     I have personally reviewed and noted the following in the patient's chart:   . Medical and social history . Use of alcohol, tobacco or illicit drugs  . Current medications and supplements . Functional ability and status . Nutritional status . Physical activity . Advanced directives . List of other physicians . Hospitalizations, surgeries, and ER visits in previous 12 months . Vitals . Screenings to include cognitive, depression, and falls . Referrals and appointments  In addition, I have reviewed and discussed with patient certain preventive protocols, quality metrics, and best practice recommendations. A written personalized care plan for preventive services as well as general preventive health recommendations were provided to patient.     Kellie Simmering, LPN   [phone]   Nurse Notes:

## 2020-02-07 ENCOUNTER — Other Ambulatory Visit: Payer: Self-pay | Admitting: Physician Assistant

## 2020-02-07 NOTE — Telephone Encounter (Signed)
Please advise if ok to refill Historical provider.

## 2020-02-12 ENCOUNTER — Ambulatory Visit (INDEPENDENT_AMBULATORY_CARE_PROVIDER_SITE_OTHER): Payer: Medicare Other | Admitting: Podiatry

## 2020-02-12 ENCOUNTER — Encounter: Payer: Self-pay | Admitting: Podiatry

## 2020-02-12 ENCOUNTER — Other Ambulatory Visit: Payer: Self-pay

## 2020-02-12 DIAGNOSIS — M79676 Pain in unspecified toe(s): Secondary | ICD-10-CM | POA: Diagnosis not present

## 2020-02-12 DIAGNOSIS — G629 Polyneuropathy, unspecified: Secondary | ICD-10-CM | POA: Diagnosis not present

## 2020-02-12 DIAGNOSIS — B351 Tinea unguium: Secondary | ICD-10-CM

## 2020-02-12 DIAGNOSIS — M722 Plantar fascial fibromatosis: Secondary | ICD-10-CM | POA: Diagnosis not present

## 2020-02-12 NOTE — Progress Notes (Signed)
This patient returns to my office for at risk foot care.  This patient requires this care by a professional since this patient will be at risk due to having neuropathy.   This patient is unable to cut nails himself since the patient cannot reach his nails.These nails are painful walking and wearing shoes.  This patient presents for at risk foot care today.  General Appearance  Alert, conversant and in no acute stress.  Vascular  Dorsalis pedis and posterior tibial  pulses are palpable  bilaterally.  Capillary return is within normal limits  bilaterally. Temperature is within normal limits  bilaterally.  Neurologic  Senn-Weinstein monofilament wire test within normal limits  bilaterally. Muscle power within normal limits bilaterally.  Nails Thick disfigured discolored nails with subungual debris  from hallux to fifth toes bilaterally. No evidence of bacterial infection or drainage bilaterally.  Orthopedic  No limitations of motion  feet .  No crepitus or effusions noted.  No bony pathology or digital deformities noted.  Palpable pain along the medial band right foot.  Cavus foot  B/L.  Skin  normotropic skin with no porokeratosis noted bilaterally.  No signs of infections or ulcers noted.     Onychomycosis  Pain in right toes  Pain in left toes  Consent was obtained for treatment procedures.   Mechanical debridement of nails 1-5  bilaterally performed with a nail nipper.  Filed with dremel without incident. Discussed nail surgery in future.   Told him to make an appointment for nail surgery with medical podiatrists. To make an appointment with [facility] for plantar fasciitis right foot.   Return office visit  9 weeks                    Told patient to return for periodic foot care and evaluation due to potential at risk complications.   Gardiner Barefoot DPM

## 2020-02-14 ENCOUNTER — Inpatient Hospital Stay (HOSPITAL_BASED_OUTPATIENT_CLINIC_OR_DEPARTMENT_OTHER): Payer: Medicare Other | Admitting: Oncology

## 2020-02-14 ENCOUNTER — Other Ambulatory Visit: Payer: Self-pay

## 2020-02-14 ENCOUNTER — Inpatient Hospital Stay: Payer: Medicare Other | Attending: Oncology

## 2020-02-14 ENCOUNTER — Encounter: Payer: Self-pay | Admitting: Oncology

## 2020-02-14 VITALS — BP 138/84 | HR 66 | Temp 97.0°F | Wt 192.4 lb

## 2020-02-14 DIAGNOSIS — D472 Monoclonal gammopathy: Secondary | ICD-10-CM | POA: Insufficient documentation

## 2020-02-14 DIAGNOSIS — I350 Nonrheumatic aortic (valve) stenosis: Secondary | ICD-10-CM | POA: Diagnosis not present

## 2020-02-14 DIAGNOSIS — G629 Polyneuropathy, unspecified: Secondary | ICD-10-CM | POA: Diagnosis not present

## 2020-02-14 DIAGNOSIS — F419 Anxiety disorder, unspecified: Secondary | ICD-10-CM | POA: Insufficient documentation

## 2020-02-14 DIAGNOSIS — Z79899 Other long term (current) drug therapy: Secondary | ICD-10-CM | POA: Diagnosis not present

## 2020-02-14 DIAGNOSIS — Z801 Family history of malignant neoplasm of trachea, bronchus and lung: Secondary | ICD-10-CM | POA: Diagnosis not present

## 2020-02-14 DIAGNOSIS — I4891 Unspecified atrial fibrillation: Secondary | ICD-10-CM | POA: Diagnosis not present

## 2020-02-14 DIAGNOSIS — Z800 Family history of malignant neoplasm of digestive organs: Secondary | ICD-10-CM | POA: Insufficient documentation

## 2020-02-14 DIAGNOSIS — Z7982 Long term (current) use of aspirin: Secondary | ICD-10-CM | POA: Diagnosis not present

## 2020-02-14 DIAGNOSIS — Z8349 Family history of other endocrine, nutritional and metabolic diseases: Secondary | ICD-10-CM | POA: Insufficient documentation

## 2020-02-14 DIAGNOSIS — I2510 Atherosclerotic heart disease of native coronary artery without angina pectoris: Secondary | ICD-10-CM | POA: Diagnosis not present

## 2020-02-14 DIAGNOSIS — I10 Essential (primary) hypertension: Secondary | ICD-10-CM | POA: Diagnosis not present

## 2020-02-14 LAB — CBC WITH DIFFERENTIAL/PLATELET
Abs Immature Granulocytes: 0.01 10*3/uL (ref 0.00–0.07)
Basophils Absolute: 0.1 10*3/uL (ref 0.0–0.1)
Basophils Relative: 1 %
Eosinophils Absolute: 0.5 10*3/uL (ref 0.0–0.5)
Eosinophils Relative: 14 %
HCT: 42.7 % (ref 39.0–52.0)
Hemoglobin: 14.6 g/dL (ref 13.0–17.0)
Immature Granulocytes: 0 %
Lymphocytes Relative: 22 %
Lymphs Abs: 0.9 10*3/uL (ref 0.7–4.0)
MCH: 32.7 pg (ref 26.0–34.0)
MCHC: 34.2 g/dL (ref 30.0–36.0)
MCV: 95.5 fL (ref 80.0–100.0)
Monocytes Absolute: 0.5 10*3/uL (ref 0.1–1.0)
Monocytes Relative: 12 %
Neutro Abs: 2 10*3/uL (ref 1.7–7.7)
Neutrophils Relative %: 51 %
Platelets: 183 10*3/uL (ref 150–400)
RBC: 4.47 MIL/uL (ref 4.22–5.81)
RDW: 13.1 % (ref 11.5–15.5)
WBC: 3.9 10*3/uL — ABNORMAL LOW (ref 4.0–10.5)
nRBC: 0 % (ref 0.0–0.2)

## 2020-02-14 LAB — COMPREHENSIVE METABOLIC PANEL
ALT: 21 U/L (ref 0–44)
AST: 28 U/L (ref 15–41)
Albumin: 4.3 g/dL (ref 3.5–5.0)
Alkaline Phosphatase: 69 U/L (ref 38–126)
Anion gap: 8 (ref 5–15)
BUN: 27 mg/dL — ABNORMAL HIGH (ref 8–23)
CO2: 28 mmol/L (ref 22–32)
Calcium: 9.2 mg/dL (ref 8.9–10.3)
Chloride: 101 mmol/L (ref 98–111)
Creatinine, Ser: 0.99 mg/dL (ref 0.61–1.24)
GFR, Estimated: >60 mL/min (ref >60)
Glucose, Bld: 107 mg/dL — ABNORMAL HIGH (ref 70–99)
Potassium: 4.2 mmol/L (ref 3.5–5.1)
Sodium: 137 mmol/L (ref 135–145)
Total Bilirubin: 1.7 mg/dL — ABNORMAL HIGH (ref 0.3–1.2)
Total Protein: 7.5 g/dL (ref 6.5–8.1)

## 2020-02-14 NOTE — Progress Notes (Signed)
Pt here for follow up. No new concerns voiced.   

## 2020-02-14 NOTE — Progress Notes (Signed)
Hematology/Oncology follow up  note [name] [phone] [phone]   Patient Care Team: Olin Hauser, DO as PCP - General (Family Medicine) Minna Merritts, MD as PCP - Cardiology (Cardiology) Renata Caprice as Physician Assistant (Orthopedic Surgery) Nickie Retort, MD as Consulting Physician (Urology) Gardiner Barefoot, DPM as Consulting Physician (Podiatry) Stark Klein Bing Neighbors, NP as Nurse Practitioner (Neurology)  REFERRING PROVIDER: Dr.Shah  CHIEF COMPLAINTS/PURPOSE OF CONSULTATION:  Follow-up for MGUS  HISTORY OF PRESENTING ILLNESS:  James Cardenas is a  79 y.o.  male with PMH listed below who was referred to me for evaluation of elevated M spike.  Patient follows up with [facility] clinic Neurology Dr. Bobbye Riggs for evaluation of neuropathy.  Patient states that symptom has started in 2060 has slightly progressed.  Mostly focus on posterior lower extremity, feels like bilateral aching, soreness, tightness.  Intermittently he will also have numbness and tingling in his feet without burning and pain.  He has had extensive workup done including normal thyroid function, normal B12, ESR, ANA, rheumatoid factor, diabetes screening, heavy metal screening.Marland Kitchen  He has had SPEP and immunofixation which showed IgG monoclonal antibody with kappa light chain specificity, M spike 0.7 g/dL.  Patient denies any bone pain.  He has good energy level and remains very active.  No raynaud's syndrome  # 10/18/2018 Echo  LVEF 55-60%, moderately increased LV wall thickness.  # atrial fibrillation/flutter, rapid ventricular response, was seen by cardiology.  Patient underwent cardioversion procedure on 01/20/2019. He takes Alpha lipoic acid, Vitamin D3.   INTERVAL HISTORY James Cardenas is a 79 y.o. male who has above history reviewed by me today present for follow-up visit for management of MGUS. Patient has no new complaints.  Denies any back  pain    Review of Systems  Constitutional: Negative for chills, fever, malaise/fatigue and weight loss.  HENT: Negative for ear discharge, ear pain, nosebleeds and sore throat.   Eyes: Negative for blurred vision, double vision, photophobia, pain and redness.  Respiratory: Negative for cough, hemoptysis, sputum production, shortness of breath and wheezing.   Cardiovascular: Negative for chest pain, palpitations, orthopnea and leg swelling.  Gastrointestinal: Negative for abdominal pain, blood in stool, nausea and vomiting.  Genitourinary: Negative for dysuria and urgency.  Musculoskeletal: Negative for back pain, myalgias and neck pain.  Skin: Negative for itching and rash.  Neurological: Negative for dizziness, tingling, tremors and focal weakness.  Endo/Heme/Allergies: Negative for environmental allergies. Does not bruise/bleed easily.  Psychiatric/Behavioral: Negative for depression and hallucinations.    MEDICAL HISTORY:  Past Medical History:  Diagnosis Date  . Anxiety   . Arthritis of knee   . Atypical atrial flutter ([place])    a. Dx 12/15/2018. CHA2DS2VASc 3-->eliquis.  . Carotid artery disease ([place])   . 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 Hearing loss    wears hearing aids  . Hypertension   . Neuropathy   . Nonobstructive Coronary artery disease    a. 07/2018 Cath: LMN nl, LAD/LCX/RCA no significant dzs. EF 55-65%.  . S/P TAVR (transcatheter aortic valve replacement)    a. 09/2018 TAVR: 26 mm Edwards Sapien THV via the TF approach; b. 10/2018 Echo: EF 55-60%. Nl RV fxn. RVSP 20.27mHg. Mild BAE. No AI or perivalvular leak.  . Severe aortic stenosis     SURGICAL HISTORY: Past Surgical History:  Procedure Laterality Date  . CARDIOVERSION N/A 01/20/2019   Procedure: CARDIOVERSION;  Surgeon: GMinna Merritts  MD;  Location: [facility] ORS;  Service: Cardiovascular;  Laterality: N/A;  . COLONOSCOPY    . EYE SURGERY Bilateral    cataract  . HERNIA REPAIR Left    Inguinal  .  NECK SURGERY  2011   Cervical Fusion  . RIGHT HEART CATH AND CORONARY ANGIOGRAPHY N/A 07/28/2018   Procedure: RIGHT HEART CATH AND CORONARY ANGIOGRAPHY;  Surgeon: Minna Merritts, MD;  Location: [place] CV LAB;  Service: Cardiovascular;  Laterality: N/A;  . TEE WITHOUT CARDIOVERSION N/A 09/13/2018   Procedure: TRANSESOPHAGEAL ECHOCARDIOGRAM (TEE);  Surgeon: Sherren Mocha, MD;  Location: [place];  Service: Open Heart Surgery;  Laterality: N/A;  . TRANSCATHETER AORTIC VALVE REPLACEMENT, TRANSFEMORAL  09/13/2018  . TRANSCATHETER AORTIC VALVE REPLACEMENT, TRANSFEMORAL N/A 09/13/2018   Procedure: TRANSCATHETER AORTIC VALVE REPLACEMENT, TRANSFEMORAL;  Surgeon: Sherren Mocha, MD;  Location: [place];  Service: Open Heart Surgery;  Laterality: N/A;    SOCIAL HISTORY: Social History   Socioeconomic History  . Marital status: Divorced    Spouse name: Not on file  . Number of children: Not on file  . Years of education: Not on file  . Highest education level: Bachelor's degree (e.g., BA, AB, BS)  Occupational History  . Occupation: Retired Designer, multimedia, Public affairs consultant / heat treating)  Tobacco Use  . Smoking status: Never Smoker  . Smokeless tobacco: Never Used  Vaping Use  . Vaping Use: Never used  Substance and Sexual Activity  . Alcohol use: Yes    Alcohol/week: 7.0 standard drinks    Types: 1 Cans of beer, 6 Glasses of wine per week  . Drug use: No  . Sexual activity: Yes  Other Topics Concern  . Not on file  Social History Narrative  . Not on file   Social Determinants of Health   Financial Resource Strain: Low Risk   . Difficulty of Paying Living Expenses: Not hard at all  Food Insecurity: No Food Insecurity  . Worried About Charity fundraiser in the Last Year: Never true  . Ran Out of Food in the Last Year: Never true  Transportation Needs: No Transportation Needs  . Lack of Transportation (Medical): No  . Lack of Transportation (Non-Medical): No  Physical Activity:  Inactive  . Days of Exercise per Week: 0 days  . Minutes of Exercise per Session: 0 min  Stress: No Stress Concern Present  . Feeling of Stress : Not at all  Social Connections:   . Frequency of Communication with Friends and Family: Not on file  . Frequency of Social Gatherings with Friends and Family: Not on file  . Attends Religious Services: Not on file  . Active Member of Clubs or Organizations: Not on file  . Attends Archivist Meetings: Not on file  . Marital Status: Not on file  Intimate Partner Violence:   . Fear of Current or Ex-Partner: Not on file  . Emotionally Abused: Not on file  . Physically Abused: Not on file  . Sexually Abused: Not on file    FAMILY HISTORY: Family History  Problem Relation Age of Onset  . Thyroid disease Mother   . Lung cancer Father   . Pancreatic cancer Brother   . Alzheimer's disease Sister   . Prostate cancer Neg Hx   . Bladder Cancer Neg Hx   . Kidney cancer Neg Hx     ALLERGIES:  is allergic to nickel.  MEDICATIONS:  Current Outpatient Medications  Medication Sig Dispense Refill  . Alpha-Lipoic Acid  600 MG CAPS Take 600 mg by mouth every morning.     Marland Kitchen ALPRAZolam (XANAX) 0.5 MG tablet TAKE 1 TABLET BY MOUTH 2 TIMES A DAY AS NEEDED FOR ANXIETY 60 tablet 2  . aspirin EC 81 MG tablet Take 1 tablet (81 mg total) by mouth daily. 90 tablet 3  . Azelaic Acid 15 % cream Apply 1 application topically daily. After skin is thoroughly washed and patted dry, gently but thoroughly massage a thin film of azelaic acid cream into the affected area once daily    . Carboxymethylcellul-Glycerin (LUBRICATING EYE DROPS OP) Place 1 drop into both eyes 2 (two) times a day.     . cholecalciferol (VITAMIN D3) 25 MCG (1000 UT) tablet Take 1,000 Units by mouth daily.    Marland Kitchen losartan (COZAAR) 100 MG tablet TAKE 1 TABLET BY MOUTH EVERY DAY 90 tablet 2  . meclizine (ANTIVERT) 25 MG tablet Take 1 tablet (25 mg total) by mouth 3 (three) times daily as  needed for dizziness. 30 tablet 1  . Multiple Vitamin (MULTIVITAMIN WITH MINERALS) TABS tablet Take 1 tablet by mouth daily at 3 pm. Centrum    . MYRBETRIQ 50 MG TB24 tablet TAKE 1 TABLET BY MOUTH EVERY DAY 30 tablet 5  . OVER THE COUNTER MEDICATION Take 1 tablet by mouth at bedtime as needed (sleep). GNC PREVENTIVE NUTRITION TRI-SLEEP    . polyethylene glycol (MIRALAX / GLYCOLAX) packet Take 17 g by mouth 2 (two) times daily as needed (constipation).     . rosuvastatin (CRESTOR) 10 MG tablet TAKE 1 TABLET BY MOUTH EVERY DAY 90 tablet 3  . sildenafil (REVATIO) 20 MG tablet Take up to 5 pills about 30 min prior to sex 50 tablet 8  . tretinoin (RETIN-A) 0.1 % cream Apply 1 application topically at bedtime.   0  . triamcinolone cream (KENALOG) 0.1 % APPLY TWICE DAILY TO AFFECTED RASH AS NEEDED    . amoxicillin (AMOXIL) 500 MG tablet Take 2,000 mg (4 pills) one hour prior to all dental visits. There are enough for two visits in this bottle. (Patient not taking: Reported on 02/14/2020) 8 tablet 6   No current facility-administered medications for this visit.     PHYSICAL EXAMINATION: ECOG PERFORMANCE STATUS: 0 - Asymptomatic Vitals:   02/14/20 1009  BP: 138/84  Pulse: 66  Temp: (!) 97 F (36.1 C)  SpO2: 100%   Filed Weights   02/14/20 1009  Weight: 192 lb 6.4 oz (87.3 kg)    Physical Exam Constitutional:      General: He is not in acute distress.    Appearance: He is not diaphoretic.  HENT:     Head: Normocephalic and atraumatic.     Nose: Nose normal.     Mouth/Throat:     Pharynx: No oropharyngeal exudate.  Eyes:     General: No scleral icterus.    Conjunctiva/sclera: Conjunctivae normal.     Pupils: Pupils are equal, round, and reactive to light.  Neck:     Vascular: No JVD.  Cardiovascular:     Rate and Rhythm: Normal rate and regular rhythm.     Heart sounds: No murmur heard.   Pulmonary:     Effort: Pulmonary effort is normal. No respiratory distress.     Breath  sounds: Normal breath sounds. No wheezing or rales.  Chest:     Chest wall: No tenderness.  Abdominal:     General: Bowel sounds are normal. There is no distension.  Palpations: Abdomen is soft.     Tenderness: There is no abdominal tenderness. There is no guarding or rebound.  Musculoskeletal:        General: Normal range of motion.     Cervical back: Normal range of motion and neck supple.  Lymphadenopathy:     Cervical: No cervical adenopathy.  Skin:    General: Skin is warm and dry.     Findings: No erythema or rash.  Neurological:     Mental Status: He is alert and oriented to person, place, and time.     Cranial Nerves: No cranial nerve deficit.     Motor: No abnormal muscle tone.     Coordination: Coordination normal.  Psychiatric:        Mood and Affect: Affect normal.        Cognition and Memory: Memory normal.        Judgment: Judgment normal.      LABORATORY DATA:  I have reviewed the data as listed Lab Results  Component Value Date   WBC 3.9 (L) 02/14/2020   HGB 14.6 02/14/2020   HCT 42.7 02/14/2020   MCV 95.5 02/14/2020   PLT 183 02/14/2020   Recent Labs    08/14/19 0957 02/14/20 0945  NA 138 137  K 4.4 4.2  CL 104 101  CO2 27 28  GLUCOSE 115* 107*  BUN 20 27*  CREATININE 0.84 0.99  CALCIUM 8.8* 9.2  GFRNONAA >60 >60  GFRAA >60  --   PROT 7.0 7.5  ALBUMIN 3.9 4.3  AST 34 28  ALT 28 21  ALKPHOS 69 69  BILITOT 1.2 1.7*   09/29/2017 Vitamin B12 920 Labs done at [facility] 06/08/2017 Folate 22.3    ASSESSMENT & PLAN:  1. MGUS (monoclonal gammopathy of unknown significance)   #MGUS, IgG type. M spike has been stable around 0.6-0.7. Today's SPEP results are pending at the time of dictation. If stable, recommend patient to continue follow-up annually .  He agrees with the plan.    All questions were answered. The patient knows to call the clinic with any problems questions or concerns.  Return of visit: 1 year .   Earlie Server, MD, PhD  Hematology Oncology [facility] at [facility] Pager- [phone] 02/14/2020

## 2020-02-15 LAB — MULTIPLE MYELOMA PANEL, SERUM
Albumin SerPl Elph-Mcnc: 3.7 g/dL (ref 2.9–4.4)
Albumin/Glob SerPl: 1.2 (ref 0.7–1.7)
Alpha 1: 0.2 g/dL (ref 0.0–0.4)
Alpha2 Glob SerPl Elph-Mcnc: 0.5 g/dL (ref 0.4–1.0)
B-Globulin SerPl Elph-Mcnc: 1 g/dL (ref 0.7–1.3)
Gamma Glob SerPl Elph-Mcnc: 1.4 g/dL (ref 0.4–1.8)
Globulin, Total: 3.2 g/dL (ref 2.2–3.9)
IgA: 253 mg/dL (ref 61–437)
IgG (Immunoglobin G), Serum: 1560 mg/dL (ref 603–1613)
IgM (Immunoglobulin M), Srm: 33 mg/dL (ref 15–143)
M Protein SerPl Elph-Mcnc: 0.8 g/dL — ABNORMAL HIGH
Total Protein ELP: 6.9 g/dL (ref 6.0–8.5)

## 2020-02-15 LAB — KAPPA/LAMBDA LIGHT CHAINS
Kappa free light chain: 27.8 mg/L — ABNORMAL HIGH (ref 3.3–19.4)
Kappa, lambda light chain ratio: 2.04 — ABNORMAL HIGH (ref 0.26–1.65)
Lambda free light chains: 13.6 mg/L (ref 5.7–26.3)

## 2020-02-15 LAB — MISC LABCORP TEST (SEND OUT): Labcorp test code: 143000

## 2020-02-22 DIAGNOSIS — Z23 Encounter for immunization: Secondary | ICD-10-CM | POA: Diagnosis not present

## 2020-02-27 ENCOUNTER — Other Ambulatory Visit: Payer: Self-pay | Admitting: Family Medicine

## 2020-02-27 DIAGNOSIS — F418 Other specified anxiety disorders: Secondary | ICD-10-CM

## 2020-02-27 NOTE — Telephone Encounter (Signed)
Requested medications are due for refill today yes  Requested medications are on the active medication list yes  Last refill yes  Last visit 2 years ago last visit to address anxiety  Future visit scheduled no  Notes to clinic Failed protocol of visit within 6 months and med is Not Delegated.

## 2020-03-01 ENCOUNTER — Telehealth: Payer: Self-pay | Admitting: Family Medicine

## 2020-03-01 DIAGNOSIS — F418 Other specified anxiety disorders: Secondary | ICD-10-CM

## 2020-03-01 NOTE — Telephone Encounter (Signed)
Non delegated Rx- transmission failed- sent for review of request- may need to be resent to pharmacy

## 2020-03-08 NOTE — Telephone Encounter (Signed)
RX failed when sent/ please resend and advise

## 2020-03-12 MED ORDER — ALPRAZOLAM 0.5 MG PO TABS: ORAL_TABLET | ORAL | 1 refills | Status: DC

## 2020-03-12 NOTE — Addendum Note (Signed)
Addended by: Frederich Cha D on: 03/12/2020 10:07 AM   Modules accepted: Orders

## 2020-03-12 NOTE — Telephone Encounter (Signed)
Called and confirm with the pharmacy they did not receive Rx that was send 03/01/2020--verbal given and resend just needed to cosign.

## 2020-03-13 ENCOUNTER — Ambulatory Visit (INDEPENDENT_AMBULATORY_CARE_PROVIDER_SITE_OTHER): Payer: Medicare Other | Admitting: Orthotics

## 2020-03-13 ENCOUNTER — Other Ambulatory Visit: Payer: Self-pay

## 2020-03-13 DIAGNOSIS — M722 Plantar fascial fibromatosis: Secondary | ICD-10-CM

## 2020-03-13 DIAGNOSIS — M2041 Other hammer toe(s) (acquired), right foot: Secondary | ICD-10-CM

## 2020-03-13 DIAGNOSIS — G629 Polyneuropathy, unspecified: Secondary | ICD-10-CM

## 2020-03-13 DIAGNOSIS — M2042 Other hammer toe(s) (acquired), left foot: Secondary | ICD-10-CM

## 2020-03-13 NOTE — Progress Notes (Signed)
Repeat previous f/o except hugging arch more as he has significan pes cavus  Signed ABN

## 2020-03-15 ENCOUNTER — Other Ambulatory Visit: Payer: Self-pay | Admitting: *Deleted

## 2020-03-15 DIAGNOSIS — N401 Enlarged prostate with lower urinary tract symptoms: Secondary | ICD-10-CM

## 2020-03-15 DIAGNOSIS — R7309 Other abnormal glucose: Secondary | ICD-10-CM

## 2020-03-15 DIAGNOSIS — I10 Essential (primary) hypertension: Secondary | ICD-10-CM

## 2020-03-15 DIAGNOSIS — I7090 Unspecified atherosclerosis: Secondary | ICD-10-CM

## 2020-03-17 IMAGING — CR DG CHEST 2V
2 series · 2 of 2 positions shown · non-contrast
Comparison: 09/13/2018

CLINICAL DATA: Tachycardia atrial flutter

EXAM:
CHEST - 2 VIEW

[chest pa]
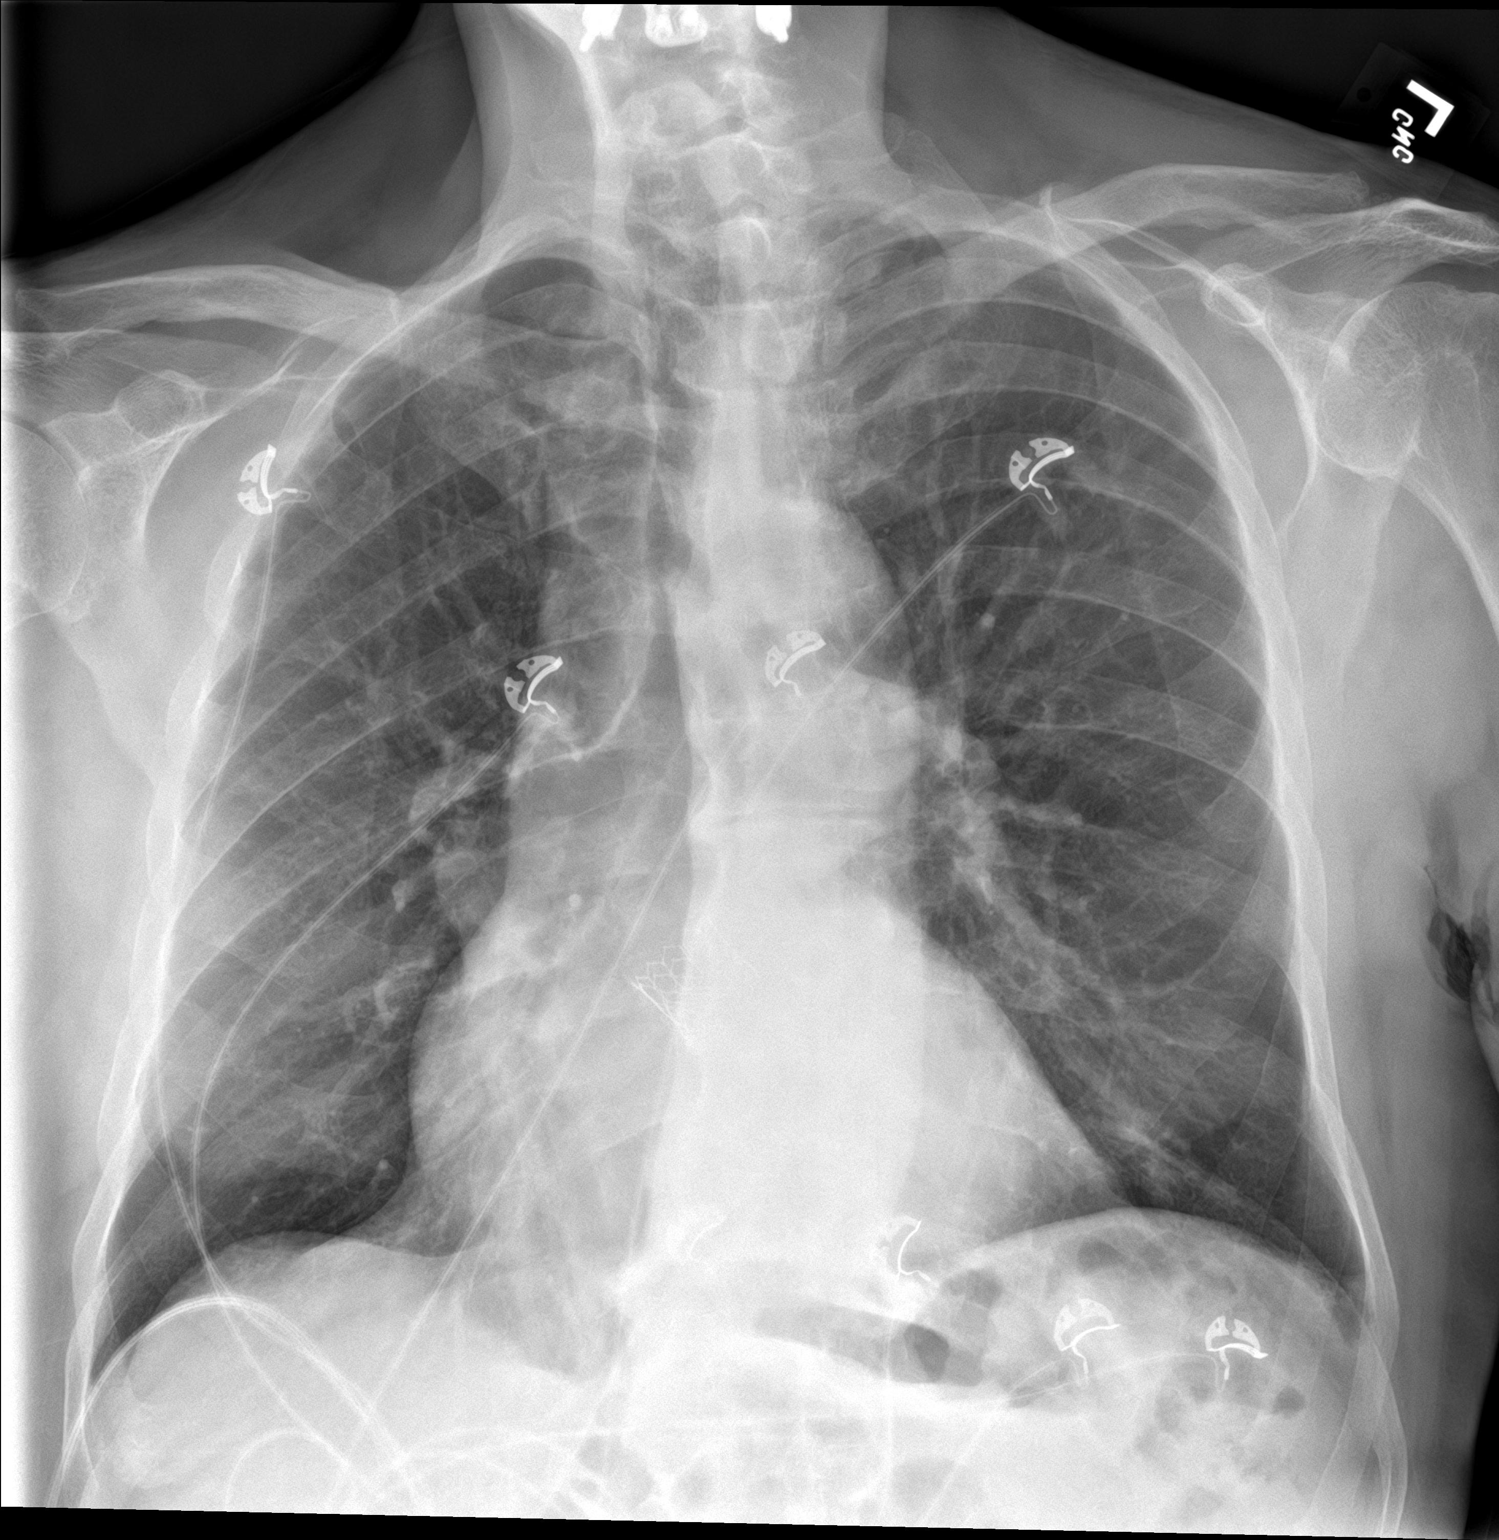

[chest lat]
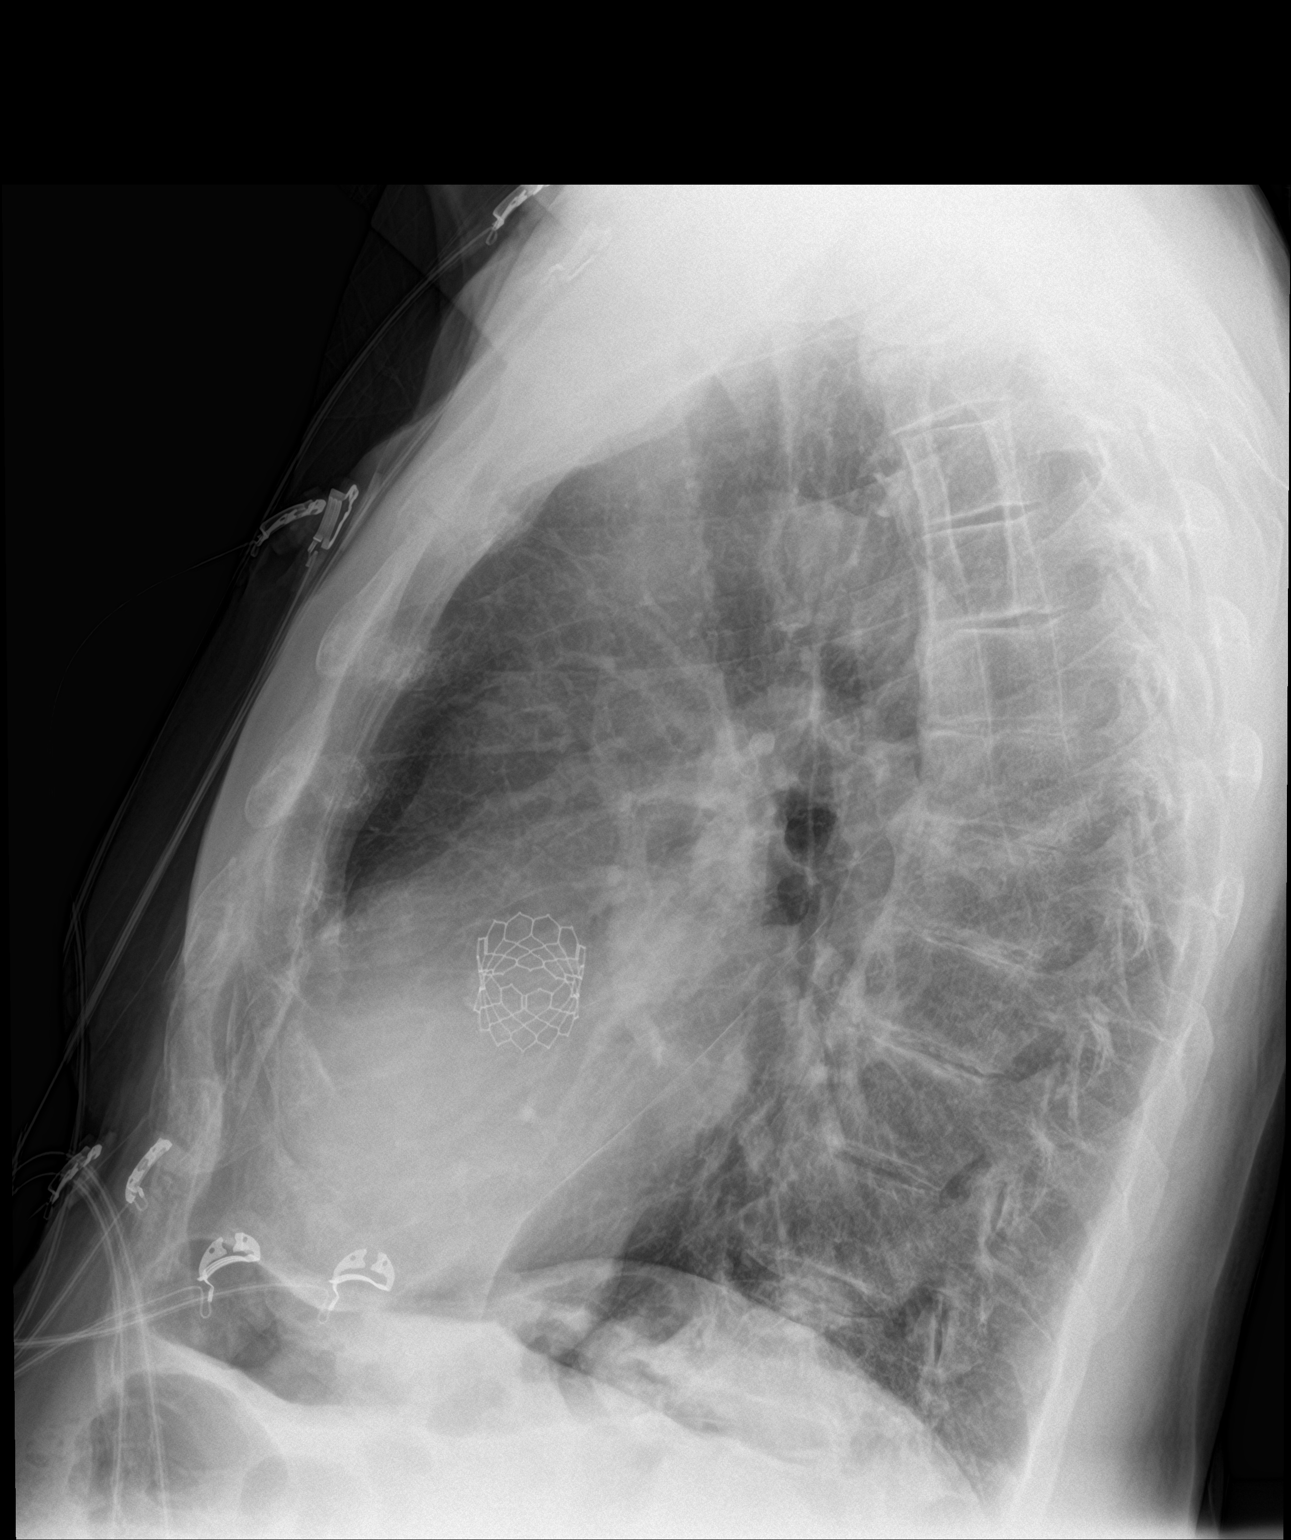

[2 of 2 positions shown; findings below may reference images not displayed]

FINDINGS: Partially visualized hardware in the cervical spine. Hyperinflated
lungs. Aortic valve prosthesis. No acute airspace disease or
effusion. Stable borderline cardiomegaly. No pneumothorax.
IMPRESSION: No active cardiopulmonary disease.  Borderline cardiomegaly.

## 2020-03-18 ENCOUNTER — Other Ambulatory Visit: Payer: Medicare Other

## 2020-03-18 ENCOUNTER — Other Ambulatory Visit: Payer: Self-pay

## 2020-03-18 DIAGNOSIS — R350 Frequency of micturition: Secondary | ICD-10-CM | POA: Diagnosis not present

## 2020-03-18 DIAGNOSIS — N401 Enlarged prostate with lower urinary tract symptoms: Secondary | ICD-10-CM | POA: Diagnosis not present

## 2020-03-18 DIAGNOSIS — I7090 Unspecified atherosclerosis: Secondary | ICD-10-CM | POA: Diagnosis not present

## 2020-03-18 DIAGNOSIS — R7309 Other abnormal glucose: Secondary | ICD-10-CM | POA: Diagnosis not present

## 2020-03-18 DIAGNOSIS — I10 Essential (primary) hypertension: Secondary | ICD-10-CM | POA: Diagnosis not present

## 2020-03-19 LAB — BASIC METABOLIC PANEL WITH GFR
BUN: 24 mg/dL (ref 7–25)
CO2: 29 mmol/L (ref 20–32)
Calcium: 9.3 mg/dL (ref 8.6–10.3)
Chloride: 104 mmol/L (ref 98–110)
Creat: 0.93 mg/dL (ref 0.70–1.18)
GFR, Est African American: 91 mL/min/{1.73_m2} (ref >=60)
GFR, Est Non African American: 78 mL/min/{1.73_m2} (ref >=60)
Glucose, Bld: 101 mg/dL — ABNORMAL HIGH (ref 65–99)
Potassium: 4.8 mmol/L (ref 3.5–5.3)
Sodium: 138 mmol/L (ref 135–146)

## 2020-03-19 LAB — LIPID PANEL
Cholesterol: 139 mg/dL (ref <200)
HDL: 66 mg/dL (ref >=40)
LDL Cholesterol (Calc): 58 mg/dL (calc)
Non-HDL Cholesterol (Calc): 73 mg/dL (calc) (ref <130)
Total CHOL/HDL Ratio: 2.1 (calc) (ref <5.0)
Triglycerides: 66 mg/dL (ref <150)

## 2020-03-19 LAB — PSA: PSA: 3.31 ng/mL (ref <=4.0)

## 2020-03-19 LAB — HEMOGLOBIN A1C
Hgb A1c MFr Bld: 5.5 % of total Hgb (ref <5.7)
Mean Plasma Glucose: 111 mg/dL
eAG (mmol/L): 6.2 mmol/L

## 2020-03-20 ENCOUNTER — Encounter: Payer: Self-pay | Admitting: Podiatry

## 2020-03-20 ENCOUNTER — Other Ambulatory Visit: Payer: Self-pay

## 2020-03-20 ENCOUNTER — Ambulatory Visit (INDEPENDENT_AMBULATORY_CARE_PROVIDER_SITE_OTHER): Payer: Medicare Other | Admitting: Podiatry

## 2020-03-20 DIAGNOSIS — L600 Ingrowing nail: Secondary | ICD-10-CM | POA: Diagnosis not present

## 2020-03-20 MED ORDER — AMOXICILLIN 500 MG PO TABS
ORAL_TABLET | ORAL | 0 refills | Status: DC
Start: 2020-03-20 — End: 2020-04-23

## 2020-03-20 NOTE — Patient Instructions (Signed)
Take 2,000mg  amoxicillin 1 hour before your next appointment

## 2020-03-22 NOTE — Progress Notes (Signed)
  Subjective:  Patient ID: James Cardenas, male    DOB: 1941/04/01,  MRN: [id number]  Chief Complaint  Patient presents with  . Ingrown Toenail    Patient presents today for ingrown toenail medial border left 4th toe, request to be removed    79 y.o. male presents with the above complaint. History confirmed with patient.  Normally sees Dr. Prudence Davidson for nail care.  His bilateral fourth toe lateral border has become ingrown and is painful.  He would like it removed permanently.  He has a heart valve replacement and has been advised for dental procedures that he should take amoxicillin prior to procedures.  Objective:  Physical Exam: warm, good capillary refill, no trophic changes or ulcerative lesions, normal DP and PT pulses and normal sensory exam.  Bilateral fourth toes with ingrowing lateral borders.  No paronychia.    Assessment:  No diagnosis found.   Plan:  Patient was evaluated and treated and all questions answered.  I think it be best if we follow the advice of his cardiologist that he did take some amoxicillin as he does or do not procedures prior to doing avulsions and matricectomy's.  He will return in 2 weeks with this and we will perform the procedure.  Return in about 2 weeks (around 04/03/2020) for nail avulsions and matrixectomies, taking antibiotic before next visit.

## 2020-03-25 ENCOUNTER — Encounter: Payer: Self-pay | Admitting: Family Medicine

## 2020-03-25 ENCOUNTER — Other Ambulatory Visit: Payer: Self-pay

## 2020-03-25 ENCOUNTER — Ambulatory Visit (INDEPENDENT_AMBULATORY_CARE_PROVIDER_SITE_OTHER): Payer: Medicare Other | Admitting: Family Medicine

## 2020-03-25 VITALS — BP 116/51 | HR 65 | Temp 97.8°F | Resp 16 | Ht 75.0 in | Wt 196.6 lb

## 2020-03-25 DIAGNOSIS — R350 Frequency of micturition: Secondary | ICD-10-CM | POA: Diagnosis not present

## 2020-03-25 DIAGNOSIS — N401 Enlarged prostate with lower urinary tract symptoms: Secondary | ICD-10-CM | POA: Diagnosis not present

## 2020-03-25 DIAGNOSIS — R7309 Other abnormal glucose: Secondary | ICD-10-CM | POA: Diagnosis not present

## 2020-03-25 DIAGNOSIS — I7090 Unspecified atherosclerosis: Secondary | ICD-10-CM | POA: Diagnosis not present

## 2020-03-25 DIAGNOSIS — F418 Other specified anxiety disorders: Secondary | ICD-10-CM

## 2020-03-25 DIAGNOSIS — I10 Essential (primary) hypertension: Secondary | ICD-10-CM | POA: Diagnosis not present

## 2020-03-25 DIAGNOSIS — M159 Polyosteoarthritis, unspecified: Secondary | ICD-10-CM

## 2020-03-25 DIAGNOSIS — G609 Hereditary and idiopathic neuropathy, unspecified: Secondary | ICD-10-CM

## 2020-03-25 DIAGNOSIS — I779 Disorder of arteries and arterioles, unspecified: Secondary | ICD-10-CM

## 2020-03-25 DIAGNOSIS — I272 Pulmonary hypertension, unspecified: Secondary | ICD-10-CM

## 2020-03-25 DIAGNOSIS — M8949 Other hypertrophic osteoarthropathy, multiple sites: Secondary | ICD-10-CM | POA: Diagnosis not present

## 2020-03-25 DIAGNOSIS — M150 Primary generalized (osteo)arthritis: Secondary | ICD-10-CM

## 2020-03-25 NOTE — Assessment & Plan Note (Signed)
Stable, without complication Followed by Urology ([facility]) - Last DRE mild +2 (2017), PSA normal range but increased mild up to 3.3 (03/2020) Off Flomax now, without problems Seems LUTS mostly due to OAB - on myrbetric

## 2020-03-25 NOTE — Assessment & Plan Note (Signed)
Stable without worsening Chronic situational anxiety with some panic symptoms rarely  Controlled on PRN Alprazolam (Xanax) only No co-morbid depression / mood disorder. - Failed: Prozac 20, Duloxetine 30, Lexapro 10 (side effects), Buspar 5 1-2 times daily (side effect/intolerance) - No prior Psych / counseling  Last controlled non opiate contract 10/2017. Reviewed PDMP  Plan:  1. Discussion on chronic use BDZ Alprazolam (Xanax) again and discussed potential risk for withdrawal and side effects or other complications - Agree to continue current Alprazolam 0.5mg  BID #60 +2 refills - in future when needed - no new rx at this time, if needed he may notify our office - we can do repeat order around 09/2020 Follow-up 6 months

## 2020-03-25 NOTE — Patient Instructions (Addendum)
Thank you for coming to the office today.  Will refill Xanax at next visit in 6 months.  Call sooner if you need anything  Keep on current medication  Follow up with Specialists as planned.  Resume regular exercise   Please schedule a Follow-up Appointment to: Return in about 6 months (around 09/23/2020) for 6 month follow-up PreDM A1c, HTN, Anxiety med refill.  If you have any other questions or concerns, please feel free to call the office or send a message through [place]. You may also schedule an earlier appointment if necessary.  Additionally, you may be receiving a survey about your experience at our office within a few days to 1 week by e-mail or mail. We value your feedback.  [name], DO [place]

## 2020-03-25 NOTE — Assessment & Plan Note (Signed)
Stable improved PreDM A1c to 5.5  Plan:  1. Not on any therapy currently  2. Encourage improved lifestyle - low carb, low sugar diet, reduce portion size, continue improving regular exercise  Check q 6 month

## 2020-03-25 NOTE — Assessment & Plan Note (Signed)
Secondary complication from aortic stenosis, now improved s/ p TAVR

## 2020-03-25 NOTE — Assessment & Plan Note (Addendum)
Improved neuropathy overall Suspected idiopathic vs hereditary etiology still, has ruled out several other possibilities Followed by [facility] Neurology Has had extensive work-up EMG, heavy metal, A1c, B12, TSH, other labs, SPEP/UPEP, vascular studies in past - Failed SNRI Duloxetine (side effects), Lexapro, supplements  Plan: 1. Continue current plan per Neurology with Alpha Lipoic Acid and Vitamin D - follow as scheduled

## 2020-03-25 NOTE — Progress Notes (Signed)
Subjective:    Patient ID: James Cardenas, male    DOB: [birth year], 79 y.o.   MRN: [id number]  James Cardenas is a 79 y.o. male presenting on 03/25/2020 for Annual Exam   HPI   FOLLOW-UPChronic Anxiety, Situational: Chronic problem - see prior visit for other SSRI SNRI meds tried and failed w/ side effects, also with buspar - He only takes Alprazolam (Xanax) 0.5mg  BID PRN only situational anxiety, often he can go weeks without taking it, but certain circumstances, doctors visit dentist visit and certain trips he may need it. He was given prior rx 60 pills with 2 refills has lasted 1 year approximately. He describes panic type symptoms if this flares up. He has enough rx and refills for up to 6 more months, usually needs about 180 pills per year. - He denies ever having any withdrawal from these medications before - Denies any depression or sad mood  Peripheral Neuropathy, Chronic Previous followed by Vascular / Neurology Off Gabapentin. On Alpha Lipoic Acid with improvement.  MGUS Followed by [facility] Heme/Onc Dr Tasia Catchings, advised for 1 year follow-up now since good check in 02/2020 - previous lab monitoring surveillance for IgG Kappa1 MGUS as a pre-myeloma state.  S/p TAVR / Aortic Stenosis / regurgitation, Moderate / Heart Murmur Followed by Cardiothoracic Surgery/Cardiology On cardiac rehab currently Overall significant improvement from surgery He is doing cardiac rehab - accelerated down to 24 visits easy bruising bleeding, on anticoagulation for 1 year post op then off Improved edema now Denies shortness of breath, chest pain, dizziness, near syncope  CHRONIC HTN with white coat hypertension Taking Telmisartan 80mg  wants to switch back to Losartan 100, not quite as strong he believes, will wait until run low then check with pharmacy on availability, due to back order Home readings normal 110-120/60-70son avg reviewed home record, attributed to situational anxiety  Denies CP,  dyspnea, HA, edema, dizziness / lightheadedness  OAB / Urinary Urge Followed by [facility] - he was given Myrbetriq 25mg  daily worked well and then seemed to be less effective, then they have increased it up to 50mg with better results again now he questions what to do next if this doesn't work, he has apt with them in follow-up  Elevated A1c Last lab A1c 5.5 with improvement previously 5.4 to 5.5 range however he had been elevated up to 5.8 (09/2019) Diet he admits some sweets, he has worked on improving.  HYPERLIPIDEMIA: - Reports no concerns. Last lipid panel 03/2020, controlled  - Currently taking Rosuvastatin 10mg , tolerating well without side effects or myalgias - On Fish Oil supplement, 2g daily he will plan to DC now  Health Maintenance: UTD Pneumonia vaccine, TDAP  PSA 3.31 (03/2020) prior range 2-3 range.  Depression screen [facility] 2/9 03/25/2020 03/25/2020 01/23/2020  Decreased Interest 0 0 0  Down, Depressed, Hopeless 0 0 0  PHQ - 2 Score 0 0 0  Altered sleeping - - -  Tired, decreased energy - - -  Change in appetite - - -  Feeling bad or failure about yourself  - - -  Trouble concentrating - - -  Moving slowly or fidgety/restless - - -  Suicidal thoughts - - -  PHQ-9 Score - - -  Difficult doing work/chores - - -    Past Medical History:  Diagnosis Date  . Anxiety   . Arthritis of knee   . Atypical atrial flutter ([place])    a. Dx 12/15/2018. CHA2DS2VASc 3-->eliquis.  . Carotid artery disease ([place])   .  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 Hearing loss    wears hearing aids  . Hypertension   . Neuropathy   . Nonobstructive Coronary artery disease    a. 07/2018 Cath: LMN nl, LAD/LCX/RCA no significant dzs. EF 55-65%.  . S/P TAVR (transcatheter aortic valve replacement)    a. 09/2018 TAVR: 26 mm Edwards Sapien THV via the TF approach; b. 10/2018 Echo: EF 55-60%. Nl RV fxn. RVSP 20.64mmHg. Mild BAE. No AI or perivalvular leak.  . Severe aortic stenosis    Past Surgical  History:  Procedure Laterality Date  . CARDIOVERSION N/A 01/20/2019   Procedure: CARDIOVERSION;  Surgeon: Minna Merritts, MD;  Location: [facility] ORS;  Service: Cardiovascular;  Laterality: N/A;  . COLONOSCOPY    . EYE SURGERY Bilateral    cataract  . HERNIA REPAIR Left    Inguinal  . NECK SURGERY  2011   Cervical Fusion  . RIGHT HEART CATH AND CORONARY ANGIOGRAPHY N/A 07/28/2018   Procedure: RIGHT HEART CATH AND CORONARY ANGIOGRAPHY;  Surgeon: Minna Merritts, MD;  Location: [place] [facility];  Service: Cardiovascular;  Laterality: N/A;  . TEE WITHOUT CARDIOVERSION N/A 09/13/2018   Procedure: TRANSESOPHAGEAL ECHOCARDIOGRAM (TEE);  Surgeon: Sherren Mocha, MD;  Location: [place];  Service: Open Heart Surgery;  Laterality: N/A;  . TRANSCATHETER AORTIC VALVE REPLACEMENT, TRANSFEMORAL  09/13/2018  . TRANSCATHETER AORTIC VALVE REPLACEMENT, TRANSFEMORAL N/A 09/13/2018   Procedure: TRANSCATHETER AORTIC VALVE REPLACEMENT, TRANSFEMORAL;  Surgeon: Sherren Mocha, MD;  Location: [place];  Service: Open Heart Surgery;  Laterality: N/A;   Social History   Socioeconomic History  . Marital status: Divorced    Spouse name: Not on file  . Number of children: Not on file  . Years of education: Not on file  . Highest education level: Bachelor's degree (e.g., BA, AB, BS)  Occupational History  . Occupation: Retired Designer, multimedia, Public affairs consultant / heat treating)  Tobacco Use  . Smoking status: Never Smoker  . Smokeless tobacco: Never Used  Vaping Use  . Vaping Use: Never used  Substance and Sexual Activity  . Alcohol use: Yes    Alcohol/week: 7.0 standard drinks    Types: 1 Cans of beer, 6 Glasses of wine per week  . Drug use: No  . Sexual activity: Yes  Other Topics Concern  . Not on file  Social History Narrative  . Not on file   Social Determinants of Health   Financial Resource Strain: Low Risk   . Difficulty of Paying Living Expenses: Not hard at all  Food Insecurity: No Food Insecurity   . Worried About Charity fundraiser in the Last Year: Never true  . Ran Out of Food in the Last Year: Never true  Transportation Needs: No Transportation Needs  . Lack of Transportation (Medical): No  . Lack of Transportation (Non-Medical): No  Physical Activity: Inactive  . Days of Exercise per Week: 0 days  . Minutes of Exercise per Session: 0 min  Stress: No Stress Concern Present  . Feeling of Stress : Not at all  Social Connections: Not on file  Intimate Partner Violence: Not on file   Family History  Problem Relation Age of Onset  . Thyroid disease Mother   . Lung cancer Father   . Pancreatic cancer Brother   . Alzheimer's disease Sister   . Prostate cancer Neg Hx   . Bladder Cancer Neg Hx   . Kidney cancer Neg Hx  Current Outpatient Medications on File Prior to Visit  Medication Sig  . Alpha-Lipoic Acid 600 MG CAPS Take 600 mg by mouth every morning.   Marland Kitchen ALPRAZolam (XANAX) 0.5 MG tablet TAKE 1 TABLET BY MOUTH TWICE A DAY AS NEEDED FOR ANXIETY  . amoxicillin (AMOXIL) 500 MG tablet Take 2,000 mg (4 pills) one hour prior to next podiatry visit for procedure  . aspirin EC 81 MG tablet Take 1 tablet (81 mg total) by mouth daily.  . Azelaic Acid 15 % cream Apply 1 application topically daily. After skin is thoroughly washed and patted dry, gently but thoroughly massage a thin film of azelaic acid cream into the affected area once daily  . Carboxymethylcellul-Glycerin (LUBRICATING EYE DROPS OP) Place 1 drop into both eyes 2 (two) times a day.   . cholecalciferol (VITAMIN D3) 25 MCG (1000 UT) tablet Take 1,000 Units by mouth daily.  Marland Kitchen losartan (COZAAR) 100 MG tablet TAKE 1 TABLET BY MOUTH EVERY DAY  . meclizine (ANTIVERT) 25 MG tablet Take 1 tablet (25 mg total) by mouth 3 (three) times daily as needed for dizziness.  . Multiple Vitamin (MULTIVITAMIN WITH MINERALS) TABS tablet Take 1 tablet by mouth daily at 3 pm. Centrum  . MYRBETRIQ 50 MG TB24 tablet TAKE 1 TABLET BY MOUTH  EVERY DAY  . OVER THE COUNTER MEDICATION Take 1 tablet by mouth at bedtime as needed (sleep). GNC PREVENTIVE NUTRITION TRI-SLEEP  . polyethylene glycol (MIRALAX / GLYCOLAX) packet Take 17 g by mouth 2 (two) times daily as needed (constipation).   . rosuvastatin (CRESTOR) 10 MG tablet TAKE 1 TABLET BY MOUTH EVERY DAY  . sildenafil (REVATIO) 20 MG tablet Take up to 5 pills about 30 min prior to sex  . tretinoin (RETIN-A) 0.1 % cream Apply 1 application topically at bedtime.   . triamcinolone cream (KENALOG) 0.1 % APPLY TWICE DAILY TO AFFECTED RASH AS NEEDED   No current facility-administered medications on file prior to visit.    Review of Systems  Constitutional: Negative for activity change, appetite change, chills, diaphoresis, fatigue and fever.  HENT: Negative for congestion and hearing loss.   Eyes: Negative for visual disturbance.  Respiratory: Negative for apnea, cough, chest tightness, shortness of breath and wheezing.   Cardiovascular: Negative for chest pain, palpitations and leg swelling.  Gastrointestinal: Negative for abdominal pain, anal bleeding, blood in stool, constipation, diarrhea, nausea and vomiting.  Endocrine: Negative for cold intolerance.  Genitourinary: Negative for difficulty urinating, dysuria, frequency and hematuria.  Musculoskeletal: Negative for arthralgias, back pain and neck pain.  Skin: Negative for rash.  Allergic/Immunologic: Negative for environmental allergies.  Neurological: Negative for dizziness, weakness, light-headedness, numbness and headaches.  Hematological: Negative for adenopathy.  Psychiatric/Behavioral: Negative for behavioral problems, dysphoric mood and sleep disturbance.   Per HPI unless specifically indicated above       Objective:    BP (!) 116/51   Pulse 65   Temp 97.8 F (36.6 C) (Temporal)   Resp 16   Ht 6\' 3"  (1.905 m)   Wt 196 lb 9.6 oz (89.2 kg) Comment: as per pt only weigh 190 lbs at home  SpO2 98%   BMI 24.57  kg/m   Wt Readings from Last 3 Encounters:  03/25/20 196 lb 9.6 oz (89.2 kg)  02/14/20 192 lb 6.4 oz (87.3 kg)  01/23/20 187 lb (84.8 kg)    Physical Exam Vitals and nursing note reviewed.  Constitutional:      General: He is not in acute  distress.    Appearance: He is well-developed and well-nourished. He is not diaphoretic.     Comments: Well-appearing, comfortable, cooperative  HENT:     Head: Normocephalic and atraumatic.     Mouth/Throat:     Mouth: Oropharynx is clear and moist.  Eyes:     General:        Right eye: No discharge.        Left eye: No discharge.     Extraocular Movements: EOM normal.     Conjunctiva/sclera: Conjunctivae normal.     Pupils: Pupils are equal, round, and reactive to light.  Neck:     Thyroid: No thyromegaly.  Cardiovascular:     Rate and Rhythm: Normal rate and regular rhythm.     Pulses: Intact distal pulses.     Heart sounds: Normal heart sounds. No murmur heard.   Pulmonary:     Effort: Pulmonary effort is normal. No respiratory distress.     Breath sounds: Normal breath sounds. No wheezing or rales.  Abdominal:     General: Bowel sounds are normal. There is no distension.     Palpations: Abdomen is soft. There is no mass.     Tenderness: There is no abdominal tenderness.  Musculoskeletal:        General: No tenderness or edema. Normal range of motion.     Cervical back: Normal range of motion and neck supple.     Right lower leg: No edema.     Left lower leg: No edema.     Comments: Upper / Lower Extremities: - Normal muscle tone, strength bilateral upper extremities 5/5, lower extremities 5/5  Lymphadenopathy:     Cervical: No cervical adenopathy.  Skin:    General: Skin is warm and dry.     Findings: No erythema or rash.  Neurological:     Mental Status: He is alert and oriented to person, place, and time.     Comments: Distal sensation intact to light touch all extremities  Psychiatric:        Mood and Affect: Mood and  affect normal.        Behavior: Behavior normal.     Comments: Well groomed, good eye contact, normal speech and thoughts       Results for orders placed or performed in visit on 17/79/39  BASIC METABOLIC PANEL WITH GFR  Result Value Ref Range   Glucose, Bld 101 (H) 65 - 99 mg/dL   BUN 24 7 - 25 mg/dL   Creat 0.93 0.70 - 1.18 mg/dL   GFR, Est Non African American 78 > OR = 60 mL/min/1.13m2   GFR, Est African American 91 > OR = 60 mL/min/1.10m2   BUN/Creatinine Ratio NOT APPLICABLE 6 - 22 (calc)   Sodium 138 135 - 146 mmol/L   Potassium 4.8 3.5 - 5.3 mmol/L   Chloride 104 98 - 110 mmol/L   CO2 29 20 - 32 mmol/L   Calcium 9.3 8.6 - 10.3 mg/dL  PSA  Result Value Ref Range   PSA 3.31 < OR = 4.0 ng/mL  Lipid panel  Result Value Ref Range   Cholesterol 139 <200 mg/dL   HDL 66 > OR = 40 mg/dL   Triglycerides 66 <150 mg/dL   LDL Cholesterol (Calc) 58 mg/dL (calc)   Total CHOL/HDL Ratio 2.1 <5.0 (calc)   Non-HDL Cholesterol (Calc) 73 <130 mg/dL (calc)  Hemoglobin A1c  Result Value Ref Range   Hgb A1c MFr Bld 5.5 <5.7 % of  total Hgb   Mean Plasma Glucose 111 mg/dL   eAG (mmol/L) 6.2 mmol/L      Assessment & Plan:   Problem List Items Addressed This Visit    Situational anxiety    Stable without worsening Chronic situational anxiety with some panic symptoms rarely  Controlled on PRN Alprazolam (Xanax) only No co-morbid depression / mood disorder. - Failed: Prozac 20, Duloxetine 30, Lexapro 10 (side effects), Buspar 5 1-2 times daily (side effect/intolerance) - No prior Psych / counseling  Last controlled non opiate contract 10/2017. Reviewed PDMP  Plan:  1. Discussion on chronic use BDZ Alprazolam (Xanax) again and discussed potential risk for withdrawal and side effects or other complications - Agree to continue current Alprazolam 0.5mg  BID #60 +2 refills - in future when needed - no new rx at this time, if needed he may notify our office - we can do repeat order around  09/2020 Follow-up 6 months      Pulmonary hypertension ([place])    Secondary complication from aortic stenosis, now improved s/ p TAVR      Peripheral neuropathy    Improved neuropathy overall Suspected idiopathic vs hereditary etiology still, has ruled out several other possibilities Followed by [facility] Neurology Has had extensive work-up EMG, heavy metal, A1c, B12, TSH, other labs, SPEP/UPEP, vascular studies in past - Failed SNRI Duloxetine (side effects), Lexapro, supplements  Plan: 1. Continue current plan per Neurology with Alpha Lipoic Acid and Vitamin D - follow as scheduled      Osteoarthritis of multiple joints   Essential hypertension - Primary    Controlled BP On ARB Losartan 100mg  daily      Elevated hemoglobin A1c    Stable improved PreDM A1c to 5.5  Plan:  1. Not on any therapy currently  2. Encourage improved lifestyle - low carb, low sugar diet, reduce portion size, continue improving regular exercise  Check q 6 month      Carotid artery disease ([facility])    PAD On Statin therapy      BPH (benign prostatic hyperplasia)    Stable, without complication Followed by Urology ([facility]) - Last DRE mild +2 (2017), PSA normal range but increased mild up to 3.3 (03/2020) Off Flomax now, without problems Seems LUTS mostly due to OAB - on myrbetric       Other Visit Diagnoses    Atherosclerosis          Updated Health Maintenance information Reviewed recent lab results with patient Encouraged improvement to lifestyle with diet and exercise - Goal of weight loss   No orders of the defined types were placed in this encounter.   Follow up plan: Return in about 6 months (around 09/23/2020) for 6 month follow-up PreDM A1c, HTN, Anxiety med refill.  [name], [facility] 03/25/2020, 2:09 PM

## 2020-03-25 NOTE — Assessment & Plan Note (Signed)
PAD On Statin therapy

## 2020-03-25 NOTE — Assessment & Plan Note (Signed)
Controlled BP On ARB Losartan 100mg  daily

## 2020-03-26 DIAGNOSIS — M720 Palmar fascial fibromatosis [Dupuytren]: Secondary | ICD-10-CM | POA: Diagnosis not present

## 2020-04-03 ENCOUNTER — Ambulatory Visit (INDEPENDENT_AMBULATORY_CARE_PROVIDER_SITE_OTHER): Payer: Medicare Other | Admitting: Orthotics

## 2020-04-03 ENCOUNTER — Other Ambulatory Visit: Payer: Self-pay

## 2020-04-03 ENCOUNTER — Encounter: Payer: Self-pay | Admitting: Podiatry

## 2020-04-03 ENCOUNTER — Ambulatory Visit (INDEPENDENT_AMBULATORY_CARE_PROVIDER_SITE_OTHER): Payer: Medicare Other | Admitting: Podiatry

## 2020-04-03 DIAGNOSIS — L600 Ingrowing nail: Secondary | ICD-10-CM | POA: Diagnosis not present

## 2020-04-03 DIAGNOSIS — M722 Plantar fascial fibromatosis: Secondary | ICD-10-CM

## 2020-04-03 DIAGNOSIS — M2041 Other hammer toe(s) (acquired), right foot: Secondary | ICD-10-CM

## 2020-04-03 DIAGNOSIS — M2042 Other hammer toe(s) (acquired), left foot: Secondary | ICD-10-CM

## 2020-04-03 DIAGNOSIS — G629 Polyneuropathy, unspecified: Secondary | ICD-10-CM

## 2020-04-03 NOTE — Progress Notes (Signed)
Patient picked up f/o and was pleased with fit, comfort, and function.  Worked well with footwear.  Told of rbeak in period and how to report any issues.  

## 2020-04-03 NOTE — Patient Instructions (Signed)

## 2020-04-04 ENCOUNTER — Telehealth: Payer: Self-pay

## 2020-04-04 MED ORDER — NEOMYCIN-POLYMYXIN-HC 3.5-10000-1 OT SUSP: OTIC | 0 refills | Status: DC

## 2020-04-04 NOTE — Telephone Encounter (Signed)
Yes that would be fine, I just sent his Rx so should be ready this afternoon as well. Thanks!

## 2020-04-04 NOTE — Telephone Encounter (Signed)
Pt called and stated that in his 04/03/20 he was told to soak his foot and apply a cream/ointment to after, however the medication was not at the pharmacy. Pt would like this sent to [facility].

## 2020-04-04 NOTE — Telephone Encounter (Signed)
Pt called back concerned about not soaking his foot, pt would like to know if he could apply first aid ointment until the script is ready to be picked up. Please advise.

## 2020-04-04 NOTE — Telephone Encounter (Signed)
LVM to notify pt.

## 2020-04-05 NOTE — Progress Notes (Signed)
  Subjective:  Patient ID: James Cardenas, male    DOB: [birth year],  MRN: [id number]  Chief Complaint  Patient presents with  . Ingrown Toenail    Patient here today for ingrown toenail removal left 4th toenail    79 y.o. male returns for procedure: Ingrown toenails.  He took his amoxicillin as directed prior to the visit today.  Objective:  Physical Exam: warm, good capillary refill, no trophic changes or ulcerative lesions, normal DP and PT pulses and normal sensory exam.  Bilateral third toes with ingrowing lateral borders and medial fourth left foot.  No paronychia.    Assessment:   1. Ingrown nail of fourth toe of left foot   2. Ingrown nail of third toe of left foot   3. Ingrown nail of third toe of right foot      Plan:  Patient was evaluated and treated and all questions answered.    Ingrown Nail, bilaterally -Patient elects to proceed with minor surgery to remove ingrown toenail today. Consent reviewed and signed by patient. -Ingrown nail excised. See procedure note. -Educated on post-procedure care including soaking. Written instructions provided and reviewed. -Patient to follow up in 2 weeks for nail check.  Procedure: Excision of Ingrown Toenail Location: Bilateral third lateral border and medial left fourth border Anesthesia: Lidocaine 1% plain; 1.5 mL and Marcaine 0.5% plain; 1.5 mL, digital block. Skin Prep: Betadine. Dressing: Silvadene; telfa; dry, sterile, compression dressing. Technique: Following skin prep, the toe was exsanguinated and a tourniquet was secured at the base of the toe. The affected nail border was freed, split with a nail splitter, and excised. Chemical matrixectomy was then performed with phenol and irrigated out with alcohol. The tourniquet was then removed and sterile dressing applied. Disposition: Patient tolerated procedure well. Patient to return in 2 weeks for follow-up.     Return in about 2 weeks (around 04/17/2020) for nail  re-check.

## 2020-04-17 ENCOUNTER — Telehealth: Payer: Self-pay | Admitting: Cardiovascular Disease

## 2020-04-17 NOTE — Telephone Encounter (Signed)
Pt c/o medication issue:  1. Name of Medication: delsym and alka seltzer plus  2. How are you currently taking this medication (dosage and times per day)? Prn for current cold   3. Are you having a reaction (difficulty breathing--STAT)? No   4. What is your medication issue?  Patient wants to know if these or similar meds are ok and safe to take .  Patient does report low bp.  Please call .

## 2020-04-17 NOTE — Telephone Encounter (Signed)
Spoke with pt about taking OTC meds with his HTN and CAD, pt reports has a "cold" has been taking Alka-Seltzer Plus Cold and Delsym cough syrup. Advised to be very cautious with Alka_Seltzer, it contains ASA and may cause an interaction with blood platelets and reduce BP. Advised to take Coricidin HBP which is made for people with HTN. Tylenol and Motrin may be taken as alternating to reduce fevers and body ahces. But advised to take any OTC medications with cautions, if you notice an reaction, bleeding, low or elevated BP, then stop medication immediatly on consult MD or seek medication attention. Pt verbalized understanding, otherwise all questions or concerns were address and no additional concerns at this time. Agreeable to plan, will call back for anything further.

## 2020-04-18 ENCOUNTER — Encounter: Payer: Self-pay | Admitting: Podiatry

## 2020-04-18 ENCOUNTER — Ambulatory Visit (INDEPENDENT_AMBULATORY_CARE_PROVIDER_SITE_OTHER): Payer: Medicare Other | Admitting: Podiatry

## 2020-04-18 ENCOUNTER — Other Ambulatory Visit: Payer: Self-pay

## 2020-04-18 DIAGNOSIS — M79676 Pain in unspecified toe(s): Secondary | ICD-10-CM | POA: Diagnosis not present

## 2020-04-18 DIAGNOSIS — Z09 Encounter for follow-up examination after completed treatment for conditions other than malignant neoplasm: Secondary | ICD-10-CM

## 2020-04-18 DIAGNOSIS — B351 Tinea unguium: Secondary | ICD-10-CM

## 2020-04-18 NOTE — Progress Notes (Signed)
This patient returns to my office for at risk foot care.  This patient requires this care by a professional since this patient will be at risk due to having neuropathy.   This patient is unable to cut nails himself since the patient cannot reach his nails.These nails are painful walking and wearing shoes.  This patient presents for at risk foot care today.  Nails painfree following nail surgery.  General Appearance  Alert, conversant and in no acute stress.  Vascular  Dorsalis pedis and posterior tibial  pulses are palpable  bilaterally.  Capillary return is within normal limits  bilaterally. Temperature is within normal limits  bilaterally.  Neurologic  Senn-Weinstein monofilament wire test within normal limits  bilaterally. Muscle power within normal limits bilaterally.  Nails Thick disfigured discolored nails with subungual debris  from hallux to fifth toes bilaterally. No evidence of bacterial infection or drainage bilaterally.  Orthopedic  No limitations of motion  feet .  No crepitus or effusions noted.  No bony pathology or digital deformities noted.  Palpable pain along the medial band right foot.  Cavus foot  B/L.  Skin  normotropic skin with no porokeratosis noted bilaterally.  No signs of infections or ulcers noted.     Onychomycosis  Pain in right toes  Pain in left toes  Consent was obtained for treatment procedures.   Mechanical debridement of nails 1-5  bilaterally performed with a nail nipper.     Return office visit  9 weeks                    Told patient to return for periodic foot care and evaluation due to potential at risk complications.   Helane Gunther DPM

## 2020-04-22 ENCOUNTER — Ambulatory Visit: Payer: Self-pay | Admitting: *Deleted

## 2020-04-22 NOTE — Telephone Encounter (Signed)
Summary: medication with high blood pressure   Patient calling states that he does have blood pressure issues and wants to know what he can take for a cold with the medication he is already on. Patient states that BP is running a bit high for him currently at 150/88.      Patient complains of stuffy head, no cough. Patient reports his BP is normally good- HR is low normally. Patient states his BP has risen to 150/80 recently. Patient is afraid that taking the Coricidin HBP may make it go up more. Patient is out walking his dogs- ask if he can be called back in 10 minutes- told him sure.

## 2020-04-22 NOTE — Telephone Encounter (Signed)
Spoke with pt regarding taking coricidin HBP and Tylenol, explain usually not seen as a reaction between the two, pt reports his concern is the fluctuation of his BP, reports when he is having the Flu-liek symptoms BP is elevated, hisg as 449Q systolic, takes the coricidin, the symptoms subsides then his BP is back to baseline, but when the symptoms hits again, the BP spikes. Pt states unsure if the medicine is causing the BP elevation or this flu. Pt reports has not seen PCP regarding his symptoms, has not been flu or COVID tested.Advised to stop medication if there is a trend in BP elevation while taking this medication and it continue to take to help improve flu-like symptoms, then need to reach out to PCP if taking for more than 5 days and not feeling better, recommended to be COVID tested based on symptoms and history of HTN, pulmonary HTN, CAD, and aortic valve stenosis, if COVID this can affect his heart r/t history. Pt advise thinks not COVID, more of flu, reports will reach out to PCP if not better in few days, wants to keep taking coricidin and monitor BP, Otherwise all questions or concerns were address and no additional concerns at this time, will call back for anything further.

## 2020-04-22 NOTE — Telephone Encounter (Signed)
Patient is concerned that he had a cold over 1 week ago and took coricidin and it almost went away and now he is having symptoms again. Patient is concerned about how his BP reacts to the cold and the medication. He states his reading for 1/8 127/82 and 1/9 115/72- but since the cold symptoms have come back- his BP is going up. Patient states he has anxiety too- and that causes his BP to go up as well. Patient states his reading today was 150/80"s - he did take it while we were talking and he states he is very nervous- it is 19//99 P 65 with no other symptom. Patient states he has anxiety medication that he can take to bring that number down and he plans to do that now. Appointment made so he can discuss his BP and cold treatment. Patient ask if increasing his anxiety medication during this time would be helpful and I advised it is something he can certainly speak to his PCP about.  Reason for Disposition . Systolic BP  >= 466 OR Diastolic >= 599  Answer Assessment - Initial Assessment Questions 1. BLOOD PRESSURE: "What is the blood pressure?" "Did you take at least two measurements 5 minutes apart?"     190/99 P 65- very anxious, 190/101 2. ONSET: "When did you take your blood pressure?"     3:52,4:00 3. HOW: "How did you obtain the blood pressure?" (e.g., visiting nurse, automatic home BP monitor)     Automatic cuff  4. HISTORY: "Do you have a history of high blood pressure?"     yes 5. MEDICATIONS: "Are you taking any medications for blood pressure?" "Have you missed any doses recently?"     Yes- no missed medications 6. OTHER SYMPTOMS: "Do you have any symptoms?" (e.g., headache, chest pain, blurred vision, difficulty breathing, weakness)     no 7. PREGNANCY: "Is there any chance you are pregnant?" "When was your last menstrual period?"     n/a  Protocols used: BLOOD PRESSURE - HIGH-A-AH

## 2020-04-22 NOTE — Telephone Encounter (Signed)
Patient calling with fu questions.   He states his bp has increased  Example 180/85 ) .  He is not sure if it is due to coricidin or if flu is causing increase.  He tried holding coricidin but continued to have  intermittent  Irregular increased bp .  Patient wants to make sure he can still continue to take coricidin and tylenol.

## 2020-04-23 ENCOUNTER — Other Ambulatory Visit: Payer: Self-pay

## 2020-04-23 ENCOUNTER — Encounter: Payer: Self-pay | Admitting: Family Medicine

## 2020-04-23 ENCOUNTER — Telehealth (INDEPENDENT_AMBULATORY_CARE_PROVIDER_SITE_OTHER): Payer: Medicare Other | Admitting: Family Medicine

## 2020-04-23 DIAGNOSIS — I10 Essential (primary) hypertension: Secondary | ICD-10-CM

## 2020-04-23 DIAGNOSIS — J069 Acute upper respiratory infection, unspecified: Secondary | ICD-10-CM

## 2020-04-23 NOTE — Progress Notes (Signed)
Virtual Visit via Telephone The purpose of this virtual visit is to provide medical care while limiting exposure to the novel coronavirus (COVID19) for both patient and office staff.  Consent was obtained for phone visit:  Yes.   Answered questions that patient had about telehealth interaction:  Yes.   I discussed the limitations, risks, security and privacy concerns of performing an evaluation and management service by telephone. I also discussed with the patient that there may be a patient responsible charge related to this service. The patient expressed understanding and agreed to proceed.  Patient Location: Home Provider Location: [facility] (Office)  Participants in virtual visit: - Patient: James Campos. Porco "Ron" - CMA: Frederich Cha, CMA - Provider: Dr Parks Ranger  ---------------------------------------------------------------------- Chief Complaint  Patient presents with  . Nasal Congestion  . Cough    S: Reviewed CMA documentation. I have called patient and gathered additional HPI as follows:  URI / Sinusitis Reports that symptoms started 1 week ago with nasal congestion and rhinorrhea, more sinusitis symptoms by his report. He has called his Cardiology office prior to starting any cold medicine. - Tried OTC Zicam, Coricidin HBP tried liquid dose of medication. - Recent history exposure with son and family having URI symptoms. They had COVID testing, result was negative.  Home BP readings 115-120s / 70s - over past 300 readings in past month. Highest reading up to 150/80s, only rarely and he has attributed this to anxiety and other symptoms Now no longer on other cold medications, his symptoms have much improved He is monitoring BP He is asking permission to take Xanax alprazolam 0.49m 3 times a day q 8 hr PRn for now short term only will likely need a new order again earlier than 6 months for routine fill  Denies any high risk travel to areas of current  concern for COVID19. Denies any known or suspected exposure to person with or possibly with COVID19.  Denies any fevers, chills, sweats, body ache, cough, shortness of breath, sinus pain or pressure, headache, abdominal pain, diarrhea  Past Medical History:  Diagnosis Date  . Anxiety   . Arthritis of knee   . Atypical atrial flutter ([place]    a. Dx 12/15/2018. CHA2DS2VASc 3-->eliquis.  . [place]   . 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 Hearing loss    wears hearing aids  . Hypertension   . Neuropathy   . Nonobstructive Coronary artery disease    a. 07/2018 Cath: LMN nl, LAD/LCX/RCA no significant dzs. EF 55-65%.  . S/P TAVR (transcatheter aortic valve replacement)    a. 09/2018 TAVR: 26 mm Edwards Sapien THV via the TF approach; b. 10/2018 Echo: EF 55-60%. Nl RV fxn. RVSP 20.157mg. Mild BAE. No AI or perivalvular leak.  . Severe aortic stenosis    Social History   Tobacco Use  . Smoking status: Never Smoker  . Smokeless tobacco: Never Used  Vaping Use  . Vaping Use: Never used  Substance Use Topics  . Alcohol use: Yes    Alcohol/week: 7.0 standard drinks    Types: 1 Cans of beer, 6 Glasses of wine per week  . Drug use: No    Current Outpatient Medications:  .  Alpha-Lipoic Acid 600 MG CAPS, Take 600 mg by mouth every morning. , Disp: , Rfl:  .  ALPRAZolam (XANAX) 0.5 MG tablet, TAKE 1 TABLET BY MOUTH TWICE A DAY AS NEEDED FOR ANXIETY, Disp: 60 tablet, Rfl: 1 .  aspirin EC 81 MG tablet, Take 1 tablet (81 mg total) by mouth daily., Disp: 90 tablet, Rfl: 3 .  Azelaic Acid 15 % cream, Apply 1 application topically daily. After skin is thoroughly washed and patted dry, gently but thoroughly massage a thin film of azelaic acid cream into the affected area once daily, Disp: , Rfl:  .  Carboxymethylcellul-Glycerin (LUBRICATING EYE DROPS OP), Place 1 drop into both eyes 2 (two) times a day., Disp: , Rfl:  .  cholecalciferol (VITAMIN D3) 25 MCG (1000 UT) tablet,  Take 1,000 Units by mouth daily., Disp: , Rfl:  .  losartan (COZAAR) 100 MG tablet, TAKE 1 TABLET BY MOUTH EVERY DAY, Disp: 90 tablet, Rfl: 2 .  meclizine (ANTIVERT) 25 MG tablet, Take 1 tablet (25 mg total) by mouth 3 (three) times daily as needed for dizziness., Disp: 30 tablet, Rfl: 1 .  Multiple Vitamin (MULTIVITAMIN WITH MINERALS) TABS tablet, Take 1 tablet by mouth daily in the afternoon. Centrum, Disp: , Rfl:  .  MYRBETRIQ 50 MG TB24 tablet, TAKE 1 TABLET BY MOUTH EVERY DAY, Disp: 30 tablet, Rfl: 5 .  neomycin-polymyxin-hydrocortisone (CORTISPORIN) 3.5-10000-1 OTIC suspension, Apply 1-2 drops daily after soaking and cover with bandaid, Disp: 10 mL, Rfl: 0 .  OVER THE COUNTER MEDICATION, Take 1 tablet by mouth at bedtime as needed (sleep). GNC PREVENTIVE NUTRITION TRI-SLEEP, Disp: , Rfl:  .  polyethylene glycol (MIRALAX / GLYCOLAX) packet, Take 17 g by mouth 2 (two) times daily as needed (constipation). , Disp: , Rfl:  .  rosuvastatin (CRESTOR) 10 MG tablet, TAKE 1 TABLET BY MOUTH EVERY DAY, Disp: 90 tablet, Rfl: 3 .  sildenafil (REVATIO) 20 MG tablet, Take up to 5 pills about 30 min prior to sex, Disp: 50 tablet, Rfl: 8 .  tretinoin (RETIN-A) 0.1 % cream, Apply 1 application topically at bedtime. , Disp: , Rfl: 0 .  triamcinolone cream (KENALOG) 0.1 %, APPLY TWICE DAILY TO AFFECTED RASH AS NEEDED, Disp: , Rfl:   Depression screen [facility] 2/9 03/25/2020 03/25/2020 01/23/2020  Decreased Interest 0 0 0  Down, Depressed, Hopeless 0 0 0  PHQ - 2 Score 0 0 0  Altered sleeping - - -  Tired, decreased energy - - -  Change in appetite - - -  Feeling bad or failure about yourself  - - -  Trouble concentrating - - -  Moving slowly or fidgety/restless - - -  Suicidal thoughts - - -  PHQ-9 Score - - -  Difficult doing work/chores - - -    GAD 7 : Generalized Anxiety Score 04/16/2017 10/06/2016 07/02/2016  Nervous, Anxious, on Edge 0 1 1  Control/stop worrying 0 0 0  Worry too much - different things  0 0 0  Trouble relaxing 0 0 0  Restless 0 0 0  Easily annoyed or irritable - 0 0  Afraid - awful might happen 0 0 0  Total GAD 7 Score - 1 1  Anxiety Difficulty Not difficult at all Not difficult at all Not difficult at all    -------------------------------------------------------------------------- O: No physical exam performed due to remote telephone encounter.  Lab results reviewed.  Recent Results (from the past 2160 hour(s))  Comprehensive metabolic panel     Status: Abnormal   Collection Time: 02/14/20  9:45 AM  Result Value Ref Range   Sodium 137 135 - 145 mmol/L   Potassium 4.2 3.5 - 5.1 mmol/L   Chloride 101 98 - 111 mmol/L   CO2 28  22 - 32 mmol/L   Glucose, Bld 107 (H) 70 - 99 mg/dL    Comment: Glucose reference range applies only to samples taken after fasting for at least 8 hours.   BUN 27 (H) 8 - 23 mg/dL   Creatinine, Ser 0.99 0.61 - 1.24 mg/dL   Calcium 9.2 8.9 - 10.3 mg/dL   Total Protein 7.5 6.5 - 8.1 g/dL   Albumin 4.3 3.5 - 5.0 g/dL   AST 28 15 - 41 U/L   ALT 21 0 - 44 U/L   Alkaline Phosphatase 69 38 - 126 U/L   Total Bilirubin 1.7 (H) 0.3 - 1.2 mg/dL   GFR, Estimated >60 >60 mL/min    Comment: (NOTE) Calculated using the CKD-EPI Creatinine Equation (2021)    Anion gap 8 5 - 15    Comment: Performed at [facility], [address] [place], [place] [address]  CBC with Differential/Platelet     Status: Abnormal   Collection Time: 02/14/20  9:45 AM  Result Value Ref Range   WBC 3.9 (L) 4.0 - 10.5 K/uL   RBC 4.47 4.22 - 5.81 MIL/uL   Hemoglobin 14.6 13.0 - 17.0 g/dL   HCT 42.7 39.0 - 52.0 %   MCV 95.5 80.0 - 100.0 fL   MCH 32.7 26.0 - 34.0 pg   MCHC 34.2 30.0 - 36.0 g/dL   RDW 13.1 11.5 - 15.5 %   Platelets 183 150 - 400 K/uL   nRBC 0.0 0.0 - 0.2 %   Neutrophils Relative % 51 %   Neutro Abs 2.0 1.7 - 7.7 K/uL   Lymphocytes Relative 22 %   Lymphs Abs 0.9 0.7 - 4.0 K/uL   Monocytes Relative 12 %   Monocytes Absolute 0.5 0.1 - 1.0 K/uL    Eosinophils Relative 14 %   Eosinophils Absolute 0.5 0.0 - 0.5 K/uL   Basophils Relative 1 %   Basophils Absolute 0.1 0.0 - 0.1 K/uL   Immature Granulocytes 0 %   Abs Immature Granulocytes 0.01 0.00 - 0.07 K/uL    Comment: Performed at [facility], [address] [place], [place] [address]  Kappa/lambda light chains     Status: Abnormal   Collection Time: 02/14/20  9:45 AM  Result Value Ref Range   Kappa free light chain 27.8 (H) 3.3 - 19.4 mg/L   Lamda free light chains 13.6 5.7 - 26.3 mg/L   Kappa, lamda light chain ratio 2.04 (H) 0.26 - 1.65    Comment: (NOTE) Performed At: [facility] [place], [place] [id number] Rush Farmer MD [id number]   Multiple Myeloma Panel (SPEP&IFE w/QIG)     Status: Abnormal   Collection Time: 02/14/20  9:45 AM  Result Value Ref Range   IgG (Immunoglobin G), Serum 1,560 603 - 1,613 mg/dL   IgA 253 61 - 437 mg/dL   IgM (Immunoglobulin M), Srm 33 15 - 143 mg/dL   Total Protein ELP 6.9 6.0 - 8.5 g/dL   Albumin SerPl Elph-Mcnc 3.7 2.9 - 4.4 g/dL   Alpha 1 0.2 0.0 - 0.4 g/dL   Alpha2 Glob SerPl Elph-Mcnc 0.5 0.4 - 1.0 g/dL   B-Globulin SerPl Elph-Mcnc 1.0 0.7 - 1.3 g/dL   Gamma Glob SerPl Elph-Mcnc 1.4 0.4 - 1.8 g/dL   M Protein SerPl Elph-Mcnc 0.8 (H) Not Observed g/dL   Globulin, Total 3.2 2.2 - 3.9 g/dL   Albumin/Glob SerPl 1.2 0.7 - 1.7   IFE 1 Comment (A)  Comment: (NOTE) Immunofixation shows IgG monoclonal protein with kappa light chain specificity. Please note that samples from patients receiving DARZALEX(R) (daratumumab) treatment can appear as an "IgG kappa" and mask a complete response. If this patient is receiving DARA, this IFE assay interference can be removed by ordering test number [id number]-"Immunofixation, Daratumumab-Specific, Serum" and submitting a new sample for testing or by calling the lab to add this test to the current sample.    Please Note Comment     Comment: (NOTE) Protein  electrophoresis scan will follow via computer, mail, or courier delivery. Performed At: [facility] [place], [place] [id number] Rush Farmer MD [id number]   Miscellaneous [facility] test (send-out)     Status: None   Collection Time: 02/14/20  9:46 AM  Result Value Ref Range   [facility] test code 143000    [facility] test name NT proBNP     Comment: Performed at [facility], [address] [place], [place] [address]   Misc [facility] result COMMENT     Comment: (NOTE) Test Ordered: 143000 NT-proBNP NT-proBNP                      532         [facility] ] pg/mL    BN     Reference Range: 0-486                                 The following cut-points have been suggested for the use of proBNP for the diagnostic evaluation of heart failure (HF) in patients with acute dyspnea: Modality                     Age           Optimal Cut                           (years)            Point ------------------------------------------------------ Diagnosis (rule in HF)        <50            450 pg/mL                          50 - 75            900 pg/mL                              >75           1800 pg/mL Exclusion (rule out HF)  Age independent     300 pg/mL Performed At: [facility] [place], [place] [id number] Rush Farmer MD [id number]   BASIC METABOLIC PANEL WITH GFR     Status: Abnormal   Collection Time: 03/18/20  8:12 AM  Result Value Ref Range   Glucose, Bld 101 (H) 65 - 99 mg/dL    Comment: .            Fasting reference interval . For someone without known diabetes, a glucose value between 100 and 125 mg/dL is consistent with prediabetes and should be confirmed with a follow-up test. .    BUN 24 7 - 25 mg/dL   Creat 0.93 0.70 - 1.18 mg/dL    Comment: For patients >  69 years of age, the reference limit for Creatinine is approximately 13% higher for people identified as African-American. .    GFR, Est Non African American 78 >  OR = 60 mL/min/1.29m   GFR, Est African American 91 > OR = 60 mL/min/1.783m  BUN/Creatinine Ratio NOT APPLICABLE 6 - 22 (calc)   Sodium 138 135 - 146 mmol/L   Potassium 4.8 3.5 - 5.3 mmol/L   Chloride 104 98 - 110 mmol/L   CO2 29 20 - 32 mmol/L   Calcium 9.3 8.6 - 10.3 mg/dL  PSA     Status: None   Collection Time: 03/18/20  8:12 AM  Result Value Ref Range   PSA 3.31 < OR = 4.0 ng/mL    Comment: The total PSA value from this assay system is  standardized against the WHO standard. The test  result will be approximately 20% lower when compared  to the equimolar-standardized total PSA (Beckman  Coulter). Comparison of serial PSA results should be  interpreted with this fact in mind. . This test was performed using the Siemens  chemiluminescent method. Values obtained from  different assay methods cannot be used interchangeably. PSA levels, regardless of value, should not be interpreted as absolute evidence of the presence or absence of disease.   Lipid panel     Status: None   Collection Time: 03/18/20  8:12 AM  Result Value Ref Range   Cholesterol 139 <200 mg/dL   HDL 66 > OR = 40 mg/dL   Triglycerides 66 <150 mg/dL   LDL Cholesterol (Calc) 58 mg/dL (calc)    Comment: Reference range: <100 . Desirable range <100 mg/dL for primary prevention;   <70 mg/dL for patients with CHD or diabetic patients  with > or = 2 CHD risk factors. . Marland KitchenDL-C is now calculated using the Martin-Hopkins  calculation, which is a validated novel method providing  better accuracy than the Friedewald equation in the  estimation of LDL-C.  MaCresenciano Genret al. JAAnnamaria Helling201610;960(45 [id number]  (http://education.[facility].com/faq/FAQ164)    Total CHOL/HDL Ratio 2.1 <5.0 (calc)   Non-HDL Cholesterol (Calc) 73 <130 mg/dL (calc)    Comment: For patients with diabetes plus 1 major ASCVD risk  factor, treating to a non-HDL-C goal of <100 mg/dL  (LDL-C of <70 mg/dL) is considered a therapeutic   option.   Hemoglobin A1c     Status: None   Collection Time: 03/18/20  8:12 AM  Result Value Ref Range   Hgb A1c MFr Bld 5.5 <5.7 % of total Hgb    Comment: For the purpose of screening for the presence of diabetes: . <5.7%       Consistent with the absence of diabetes 5.7-6.4%    Consistent with increased risk for diabetes             (prediabetes) > or =6.5%  Consistent with diabetes . This assay result is consistent with a decreased risk of diabetes. . Currently, no consensus exists regarding use of hemoglobin A1c for diagnosis of diabetes in children. . According to American Diabetes Association (ADA) guidelines, hemoglobin A1c <7.0% represents optimal control in non-pregnant diabetic patients. Different metrics may apply to specific patient populations.  Standards of Medical Care in Diabetes(ADA). .    Mean Plasma Glucose 111 mg/dL   eAG (mmol/L) 6.2 mmol/L    -------------------------------------------------------------------------- A&P:  Problem List Items Addressed This Visit    Essential hypertension    Other Visit Diagnoses    Viral URI with cough    -  Primary     #URI Now improved, no further worsening symptoms. Outside window for any covid or other testing at this point No other symptomatic medication needed may take Coricidin HBP if need  #HTN Elevated BP due to acute viral URI illness and also OTC medications for cold, also he has significant anxiety He has very detailed log of BP, see above, has had fluctuation of BP highest range SBP 150s, now has improved He will monitor BP still  #Anxiety He is taking Alprazolam 0.8m BID PRN for acute anxiety, and may need extra dose for max 3 times a day or q 8 hr PRN   No orders of the defined types were placed in this encounter.   Follow-up: PRN 1 week if not improved  Patient verbalizes understanding with the above medical recommendations including the limitation of remote medical  advice.  Specific follow-up and call-back criteria were given for patient to follow-up or seek medical care more urgently if needed.   - Time spent in direct consultation with patient on phone: 15 minutes  ANobie Putnam DJamestownGroup 04/23/2020, 12:03 PM

## 2020-04-29 NOTE — Progress Notes (Signed)
Virtual Visit via Telephone Note   This visit type was conducted due to national recommendations for restrictions regarding the COVID-19 Pandemic (e.g. social distancing) in an effort to limit this patient's exposure and mitigate transmission in our community.  Due to his co-morbid illnesses, this patient is at least at moderate risk for complications without adequate follow up.  This format is felt to be most appropriate for this patient at this time.  The patient did not have access to video technology/had technical difficulties with video requiring transitioning to audio format only (telephone).  All issues noted in this document were discussed and addressed.  No physical exam could be performed with this format.  Please refer to the patient's chart for his  consent to telehealth for [facility].   Date:  04/29/2020   ID:  Celene Skeen, DOB Jan 20, 1941, MRN [id number]  Patient Location: Home Provider Location: Home Office  PCP:  Olin Hauser, DO  Cardiologist:  Ida Rogue, MD Electrophysiologist:  None   Evaluation Performed:  Follow-Up Visit  Chief Complaint:  80 y.o. male with history of nonobstructive CAD,  TAVR 09/2018 for severe AS and moderate AI, AT / atypical atrial flutter with DCCV 01/2019, mild carotid artery dz, HTN, overactive bladder, MGUS, pulmonary HTN, pulmonary nodules, GERD, neuropathy and mild gait imbalance, and seen today for BP issues.  Chief Complaint  Patient presents with  . OTHER    C/o high blood pressure readings over the last couple of days. Medications verbally reviewed with patient.      History of Present Illness:    James Cardenas is a 80 y.o. male with PMH as above. He reports (and has reported in the past)  frustration that he was active in the past as an adult, playing competitive tennis, biking, and routinely exercising. Starting in early 2020, he developed exertional fatigue and chest tightness, as well as ankle and pedal  edema. His activity level/ dropped and increased sedentary lifestyle noted. He was not as active as in the past, due to his slow peripher. al neuropathy affecting his balance, as well as sx as above. Subsequent 04/29/2018 echo showed severely calcified aortic valve with mean gradient of 37 mm HG and peak gradient of 62 mm HG, moderate AI, LVEF 55% with moderate LVH. He then underwent R/L cardiac cath 07/28/2018 for pre-TAVR workup that showed no significant obstructive CAD and normal right heart pressures. Carotid US with mild dz. Chest CT with small pulmonary nodules.  He underwent successful TAVR 09/2018 for AS/AI with improvement in sx / exercise . Repeat echo showed nl EF without AS/AI/perivalvular leak.   At the end of cardiac rehab, he was found to have new atrial flutter with RVR. He started Eliquis and diltiazem. At RTC, he noted dizziness and hypotension with diltiazem. He was advised to discontinue losartan and continue diltiazem. Initially, it was noted he did not wish to start diltiazem and instead continued losartan. Due to ongoing elevated rates in the 110s at RTC on diltiazem, he was started on digoxin.He then underwent DCCV 01/20/19.  At RTC, he was in NSR and noted significant side effects with both diltiazem and digoxin. Both medications were discontinued. Per EMR, at RTC with primary cardiologist, decision was made to stop [place] after 1 month and as documented by Dr. Rockey Situ due to cost and anxiety surrounding the medication. He was started on ASA.   Last seen by the structural heart team 09/2019 with chronic mild LEE. He reported he  was selling his house and moving to an apartment, as well as giving away his dog. He was being active and walking with his other dog.   At RTC with primary cardiologist, Dr. Rockey Situ, he again declined [facility] due to anxiety surrounding ADRs. He also declined diuresis with lasix, recommended at time of visit per EMR.   He called the office 04/2020 with flu like sx  (treating with OTC medications) and fluctuating BP, attributed to coricidin, delsym, and alka seltzer plus. He reported elevated BP when sx were at their worse. He had not seen his PCP. He had not been tested for flu or COVID-19. Recommendation was to discontinue OTC medications and reach out to his PCP. COVID-19 test recommended but declined per EMR. He also indicated preference to continue his current OTC medications.   Visit today is for ongoing labile BP. Ongoing since the 7th  Today, 04/30/2020, he is seen via telemedicine visit (telephone only) due to ongoing cold symptoms on OTC medications with elevated blood pressure.  He reports that, starting 04/19/2020, he started having cold symptoms.  He had seen his family over the holidays, which included his son that was fighting a cold.  Shortly thereafter, estimated at 1 day later, he started to experience cold symptoms himself.  He reports that these cold symptoms included sneezing and runny nose and have persisted from 1/7 until now.  He has not yet tested for COVID-19 or flu.  Along with his cold symptoms, he has noted elevated pressure.  He notes that his BP seems influenced by the severity of his symptoms, as it is usually higher when he feels his symptoms are worse.  He reports concern for elevated blood pressure, especially given his SBP in the past has always been 329-924 systolic.  DBP usually low 70s.  He reports low/well-controlled BP for 20+ years.  He wonders if his OTC medications are contributing to his elevated BP.  He also notes that he recently had a procedure for ingrown toenails and was prescribed antibiotics for dental procedures and wonders if this contributes to his current BP.  He does admit that anxiety could be contributing to his elevated blood pressure as well, but he has always had anxiety without BP elevation to the severity.Marland Kitchen  He reports that he takes Xanax on a as needed basis. Recently this medication has been more difficult to  obtain, given recent guidelines do not recommend it in 60+.  So he takes it on rare occasions.  He states that he initially started to treat his cold symptoms with Zicam and Alka-Seltzer plus.  When he called his PCP/cardiology, it was recommended that he stop the Alka-Seltzer, as this is associated with elevated BP.  Coricidin was recommended, which he then obtained; however, he grabbed at the nighttime version, and notes that he intends to find the daytime version soon.  Despite changing up his OTC medications, his BP has continued to remain very high.  He notes SBP from 140s to 190s.  He has rare SBP 120s.  He has a log of his pressures with him, and reviews them with me today and as copied below.  He does admit that he is taking his pressures frequently, as he has a type A personality.  On further discussion, it is revealed that he has also noted weight gain.  He reports his weight is usually 188 pounds to 192 pounds, but despite continuing the same diet/no dietary changes, his weight has increased to 198 pounds.  He  has been perplexed by this weight increase, given he remains active, walking his dogs 4 times a day.  He reports between 7-10,000 steps per day.  He has not yet started biking but intends to do so.  No chest pain or shortness of breath.  No dyspnea.  No racing heart rate or palpitations.  No presyncope or syncope.  He does note chronic swelling in the legs and feet.  No signs or symptoms of bleeding.  We discussed that he is likely volume overloaded and reviewed  salt and fluid restrictions, as well as further recommendations as outlined below.  He notes that he continues on ASA 81 mg daily with recommendation per Dr. Mariah MillingGollan for as needed Eliquis if tachypalpitations.  We did discuss the indication for Eliquis to clarify its importance.   The patient does have symptoms concerning for COVID-19 infection (fever, chills, cough, or new shortness of breath).    Transcribed BP  readings: 160/97 152/90 150/84 134/83 135/82  Past Medical History:  Diagnosis Date  . Anxiety   . Arthritis of knee   . Atypical atrial flutter (HCC)    a. Dx 12/15/2018. CHA2DS2VASc 3-->eliquis.  . Carotid artery disease (HCC)   . 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 Hearing loss    wears hearing aids  . Hypertension   . Neuropathy   . Nonobstructive Coronary artery disease    a. 07/2018 Cath: LMN nl, LAD/LCX/RCA no significant dzs. EF 55-65%.  . S/P TAVR (transcatheter aortic valve replacement)    a. 09/2018 TAVR: 26 mm Edwards Sapien THV via the TF approach; b. 10/2018 Echo: EF 55-60%. Nl RV fxn. RVSP 20.681mmHg. Mild BAE. No AI or perivalvular leak.  . Severe aortic stenosis    Past Surgical History:  Procedure Laterality Date  . CARDIOVERSION N/A 01/20/2019   Procedure: CARDIOVERSION;  Surgeon: Antonieta IbaGollan, Timothy J, MD;  Location: [facility] ORS;  Service: Cardiovascular;  Laterality: N/A;  . COLONOSCOPY    . EYE SURGERY Bilateral    cataract  . HERNIA REPAIR Left    Inguinal  . NECK SURGERY  2011   Cervical Fusion  . RIGHT HEART CATH AND CORONARY ANGIOGRAPHY N/A 07/28/2018   Procedure: RIGHT HEART CATH AND CORONARY ANGIOGRAPHY;  Surgeon: Antonieta IbaGollan, Timothy J, MD;  Location: [facility] INVASIVE CV LAB;  Service: Cardiovascular;  Laterality: N/A;  . TEE WITHOUT CARDIOVERSION N/A 09/13/2018   Procedure: TRANSESOPHAGEAL ECHOCARDIOGRAM (TEE);  Surgeon: Tonny Bollmanooper, Michael, MD;  [facility];  Service: Open Heart Surgery;  Laterality: N/A;  . TRANSCATHETER AORTIC VALVE REPLACEMENT, TRANSFEMORAL  09/13/2018  . TRANSCATHETER AORTIC VALVE REPLACEMENT, TRANSFEMORAL N/A 09/13/2018   Procedure: TRANSCATHETER AORTIC VALVE REPLACEMENT, TRANSFEMORAL;  Surgeon: Tonny Bollmanooper, Michael, MD;  [facility];  Service: Open Heart Surgery;  Laterality: N/A;     No outpatient medications have been marked as taking for the 04/30/20 encounter (Appointment) with Lennon AlstromVisser, Giulia Hickey D, PA-C.     Allergies:   Nickel    Social History   Tobacco Use  . Smoking status: Never Smoker  . Smokeless tobacco: Never Used  Vaping Use  . Vaping Use: Never used  Substance Use Topics  . Alcohol use: Yes    Alcohol/week: 7.0 standard drinks    Types: 1 Cans of beer, 6 Glasses of wine per week  . Drug use: No     Family Hx: The patient's family history includes Alzheimer's disease in his sister; Lung cancer in his father; Pancreatic cancer in his brother; Thyroid  disease in his mother. There is no history of Prostate cancer, Bladder Cancer, or Kidney cancer.  ROS:   Please see the history of present illness.    He denies chest pain, palpitations, dyspnea, pnd, orthopnea, n, v, dizziness, syncope, or early satiety.  He reports swelling in his hands and feet.  He notes weight gain and elevated BP. All other systems reviewed and are negative.  Objective:    Vital Signs:  BP (!) 172/99   Ht 6\' 3"  (1.905 m)   Wt 196 lb (88.9 kg)   BMI 24.50 kg/m    VITAL SIGNS:  reviewed  Accessory clinical findings reviewed:    EKG:  No ECG reviewed.  Previous vitals reviewed today:    Temp Readings from Last 3 Encounters:  03/25/20 97.8 F (36.6 C) (Temporal)  02/14/20 (!) 97 F (36.1 C) (Tympanic)  09/22/19 (!) 97.3 F (36.3 C) (Temporal)   BP Readings from Last 3 Encounters:  03/25/20 (!) 116/51  02/14/20 138/84  09/22/19 138/75   Pulse Readings from Last 3 Encounters:  03/25/20 65  02/14/20 66  09/22/19 67    Wt Readings from Last 3 Encounters:  03/25/20 196 lb 9.6 oz (89.2 kg)  02/14/20 192 lb 6.4 oz (87.3 kg)  01/23/20 187 lb (84.8 kg)     Labs reviewed today:      Lab Results  Component Value Date   WBC 3.9 (L) 02/14/2020   HGB 14.6 02/14/2020   HCT 42.7 02/14/2020   MCV 95.5 02/14/2020   PLT 183 02/14/2020   Lab Results  Component Value Date   CREATININE 0.93 03/18/2020   BUN 24 03/18/2020   NA 138 03/18/2020   K 4.8 03/18/2020   CL 104 03/18/2020   CO2 29 03/18/2020   Lab  Results  Component Value Date   ALT 21 02/14/2020   AST 28 02/14/2020   GGT 26 07/25/2018   ALKPHOS 69 02/14/2020   BILITOT 1.7 (H) 02/14/2020   Lab Results  Component Value Date   CHOL 139 03/18/2020   HDL 66 03/18/2020   LDLCALC 58 03/18/2020   TRIG 66 03/18/2020   CHOLHDL 2.1 03/18/2020    Lab Results  Component Value Date   HGBA1C 5.5 03/18/2020   Lab Results  Component Value Date   TSH 2.066 12/16/2018     Prior CV Studies reviewed today:    The following studies were reviewed today:  Echo  09/2019 1. 26 mm S3 in aortic position. V max 2.7 m/s, MG 15 mmHG, EOA 1.26 cm2,  DI 0.33. Normal functioning TAVR without paravalvular leak or  regurgitation. Gradients are unchanged from prior. The aortic valve has  been repaired/replaced. Aortic valve  regurgitation is not visualized.  2. Left ventricular ejection fraction, by estimation, is 60 to 65%. The  left ventricle has normal function. The left ventricle has no regional  wall motion abnormalities. There is moderate concentric left ventricular  hypertrophy. Left ventricular  diastolic parameters are consistent with Grade II diastolic dysfunction  (pseudonormalization). Elevated left atrial pressure.  3. Right ventricular systolic function is normal. The right ventricular  size is normal. There is normal pulmonary artery systolic pressure. The  estimated right ventricular systolic pressure is 0000000 mmHg.  4. Left atrial size was mildly dilated.  5. The mitral valve is grossly normal. Trivial mitral valve  regurgitation. No evidence of mitral stenosis.  6. The inferior vena cava is normal in size with greater than 50%  respiratory variability, suggesting  right atrial pressure of 3 mmHg.  Comparison(s): No significant change from prior study.   Carotid US 09/2019 Summary:  Right Carotid: Velocities in the right ICA are consistent with a 1-39%  stenosis.  Left Carotid: Velocities in the left ICA are consistent  with a 40-59%  stenosis.  Vertebrals: Bilateral vertebral arteries demonstrate antegrade flow.  Subclavians: Normal flow hemodynamics were seen in bilateral subclavian        arteries.  *See table(s) above for measurements and observations.  Suggest follow up study in 12 months.   [facility] 07/2018 Final Conclusions:  No significant coronary artery disease Pulmonary HTN Unable to cross aortic valve for pressures or LV function Grossly normal right heart pressures Recommendations:  Given the findings above, we will refer to TAVR clinic in [facility] No complications  ASSESSMENT & PLAN:    Acute on chronic HFpEF  Pulmonary hypertension G2DD, moderate concentric LVH -- Reports signs and symptoms consistent with volume overload.  Reports catching a cold from his son 04/19/2020 with ongoing cold symptoms since that time.  He has not been tested for COVID-19.  Recommendation was for updated echo to reassess EF and rule out any acute structural abnormalities/valvular dysfunction in the setting of known TAVR, as well as reassess heart pressures and rule out any myopericarditis in the setting of possible COVID-19.  Previous echo as above with normal EF and G2 DD, moderate concentric LVH.  Reviewed salt and fluid restrictions.  Recommended discontinue OTC treatment unless Coricidin and cough drops, as other cold medications will likely elevate his BP.  Start Lasix 20 mg x3 days.  He has home Lasix, previously prescribed by Dr. Rockey Situ but not taken, and he will use this prescription as he has tablets remaining.  After 3 days of Lasix 20 mg, we will plan for an in person visit on Friday 1/21 to reassess his volume status/BP and discuss ongoing daily Lasix moving forward if needed versus alternative antihypertensive changes, including transition from losartan to valsartan if needed.  When seen in clinic 1/21, plan is to collect BMET to reassess renal function electrolytes.  In addition, will obtain BNP  and ReDS vest reading to trend in clinic moving forward.  Hypertension, poorly controlled  -- Goal BP 130/80.  As above, discontinue OTC medications other than Coricidin and cough drops.  Reviewed guidelines for total salt of 2 g/day and total fluid of 2 L/day.  Given his symptoms, suspect elevated BP is likely 2/2 volume overload.  We will plan for Lasix 20 mg x 3 days and reassess volume status later this week.  We discussed that, dependent upon his volume status/BP/labs, we will plan for either continued Lasix or change from losartan to valsartan for more optimal BP control.  Also discussed was possible BB and dependent on heart rates.  Previously, he is reported diltiazem intolerance.  Recommend updating echo as above to reassess EF, heart pressures, and valvular function in the setting of possible COVID-19 and elevated BP with TAVR.  Further recommendations at that time and depending on labs and volume status/vitals when reassessed in clinic.  Continue to monitor weight and BP at home.  Atrial flutter/fibrillation with RVR s/p DCCV 01/2019 -- No report of tachypalpitations.  Na DCCV 01/20/2019.  Reports rates well controlled.  Not currently on rate control medication.  He noted adverse effects 2/2 diltiazem, as well as digoxin.  He has since been taken off of these medications.  In the past, beta-blocker has been deferred due to bradycardic  rates at home.  Reassess at RTC.  Per patient preference, he is not on anticoagulation and instead remains on ASA 81 mg daily.  He reports discussing PRN anticoagulation with Dr. Rockey Situ, therefore the recommendation is to take Eliquis on days that he feels tachypalpitations.  We reviewed the reason for this Eliquis, given he stated that he was unclear of why this medication had been prescribed..CHA2DS2VASc score of at least 5 (Pulmonary HTN, agex2, vascular) with recommendation for daily OAC with Eliquis 5 mg twice daily as he does not meet reduced dosing criteria and  to prevent risk of thromboembolic event.    Continue to recommend anticoagulation instead of ASA 81 mg daily, as discussed.  Recommend CBC, BMET at RTC to reassess blood counts and renal function on Eliquis.   AS s/p TAVR --  S/p TAVR 09/2018.  Previous echo showed normal LV function, as well as normal device function without significant aortic insufficiency or perivalvular leak.  SBE prophylaxis recommended with patient report that he takes an antibiotic before his dentist.  As above, continue to recommend Eliquis with patient preference to instead take ASA.  Recommend BP and heart rate control.  Discussed the stress that elevated BP and volume status places on his TAVR today.  Given his volume overload, recommend update echo as above.  Continue to follow-up with the TAVR team as recommended.  HLD, LDL goal below 70 --Continue statin.  Continue to monitor with periodic lipid and liver function per PCP.  Carotid artery disease --Doppler showed 1 to 39% R ICA and 40 to 10% LICA stenosis.  Repeat carotids with mild/stable disease.  With previous imaging as above.  Recommend Eliquis as above, given atrial fibrillation, TAVR, and carotid disease.  Continue risk factor modification with statin.  Continue to monitor lipids and liver function per PCP.  Recommend repeat carotid studies 09/2020.  Cold/flu symptoms  -- Per PCP.  Recommendation is to avoid OTC cold and flu medications, other than Coricidin, which is still not recommended for long-term use.  Further management per PCP.    Pulmonary nodules - Noted on CT in 2020 with recommendation for follow-up chest CT.  Anxiety --Continue management per primary care.  We did discuss this could be contributing to his elevated pressures.  Given his anxiety associated with his elevated pressures and volume status, recommendation was for follow-up in clinic later this week.  MGUS -- Recommend updated echo as above, given volume overload today.  Dependent on  findings of echo, could consider cardiac MRI in the future to rule out amyloidosis and after repeat ischemic work-up.  Will defer for now.  Continue management per primary care.  Overactive bladder --Discussed overactive bladder and with consideration of diuretic therapy.  He is in agreement to trial diuretic therapy at this time and is aware it may exacerbate any underlying overactive bladder symptoms.   COVID-19 Education: The signs and symptoms of COVID-19 were discussed with the patient and how to seek care for testing (follow up with PCP or arrange E-visit). The importance of social distancing was discussed today.  Time:   Today, I have spent  24minutes with the patient with telehealth technology discussing the above problems.     Medication Adjustments/Labs and Tests Ordered: Current medicines are reviewed at length with the patient today.  Concerns regarding medicines are outlined above.   Medication changes: Restart Lasix 20 mg x 3 days.  Further recommendations pending labs and in person volume assessment 1/21. Labs ordered: BMET, CBC, BNP  Studies / Imaging ordered: Echo Future considerations: Reds vest.  Possible transition from losartan to valsartan.  Reassess need for beta-blocker.  Continue to work on transition from ASA to [employer].  Possible daily Lasix moving forward.  Further recommendations if indicated pending echo.  Given MGUS, and depending on status of echo, could consider cardiac MRI after repeat ischemic work-up in the future if indicated. Disposition: In Person in 3 day(s)  Signed, Magda Bernheim  04/29/2020  [facility]

## 2020-04-29 NOTE — Progress Notes (Incomplete)
{Choose 1 Note Type (Telehealth Visit or Telephone Visit):[phone]}   Date:  04/29/2020   ID:  James Cardenas, DOB 1940-05-13, MRN [id number]  {Patient Location:[phone]::"Home"} {Provider Location:[phone]::"Home Office"}  PCP:  James Hauser, DO  Cardiologist:  James Rogue, MD *** Electrophysiologist:  None   Evaluation Performed:  {Choose Visit [phone]::"Follow-Up Visit"}  Chief Complaint:  80 y.o. male with history of nonobstructive CAD,  TAVR 09/2018 for severe AS and moderate AI, AT / atypical atrial flutter with DCCV 01/2019, mild carotid artery dz, HTN, overactive bladder, MGUS, pulmonary HTN, pulmonary nodules, GERD, neuropathy and mild gait imbalance, and seen today for BP issues. No chief complaint on file.    History of Present Illness:    James Cardenas is a 80 y.o. male with PMH as above. He reports (and has reported in the past)  frustration that he was active in the past as an adult, playing competitive tennis, biking, and routinely exercising. Starting in early 2020, he developed exertional fatigue and chest tightness, as well as ankle and pedal edema. His activity level/ dropped and increased sedentary lifestyle noted. He was not as active as in the past, due to his slow peripher. al neuropathy affecting his balance, as well as sx as above. Subsequent 04/29/2018 echo showed severely calcified aortic valve with mean gradient of 37 mm HG and peak gradient of 62 mm HG, moderate AI, LVEF 55% with moderate LVH. He then underwent R/L cardiac cath 07/28/2018 for pre-TAVR workup that showed no significant obstructive CAD and normal right heart pressures. Carotid US with mild dz. Chest CT with small pulmonary nodules.  He underwent successful TAVR 09/2018 for AS/AI with improvement in sx / exercise . Repeat echo showed nl EF without AS/AI/perivalvular leak.   At the end of cardiac rehab, he was found to have new atrial flutter with RVR. He started Eliquis and  diltiazem. At RTC, he noted dizziness and hypotension with diltiazem. He was advised to discontinue losartan and continue diltiazem. Initially, it was noted he did not wish to start diltiazem and instead continued losartan. Due to ongoing elevated rates in the 110s at RTC on diltiazem, he was started on digoxin.He then underwent DCCV 01/20/19.  At RTC, he was in NSR and noted significant side effects with both diltiazem and digoxin. Both medications were discontinued. Per EMR, at RTC with primary cardiologist, decision was made to stop James Cardenas after 1 month and as documented by James Cardenas due to cost and anxiety surrounding the medication. He was started on ASA.   Last seen by the structural heart team 09/2019 with chronic mild LEE. He reported he was selling his house and moving to an apartment, as well as giving away his dog. He was being active and walking with his other dog.   At RTC with primary cardiologist, James Cardenas, he again declined [facility] due to anxiety surrounding ADRs. He also declined diuresis with lasix, recommended at time of visit per EMR.   He called the office 04/2020 with flu like sx (treating with OTC medications) and fluctuating BP, attributed to coricidin, delsym, and alka seltzer plus. He reported elevated BP when sx were at their worse. He had not seen his PCP. He had not been tested for flu or COVID-19. Recommendation was to discontinue OTC medications and reach out to his PCP. COVID-19 test recommended but declined per EMR. He also indicated preference to continue his current OTC medications.   Visit today is for ongoing labile BP. --- Stop OTC  meds Carotid repeat 09/2020  The patient {DOES NOT does:27190::"does not"} have symptoms concerning for COVID-19 infection (fever, chills, cough, or new shortness of breath).   Past Medical History:  Diagnosis Date  . Anxiety   . Arthritis of knee   . Atypical atrial flutter ([place])    a. Dx 12/15/2018. CHA2DS2VASc 3-->eliquis.  . Carotid  artery disease ([place])   . Constipation    due to James Cardenas-    . Hearing loss    wears hearing aids  . Hypertension   . Neuropathy   . Nonobstructive Coronary artery disease    a. 07/2018 Cath: LMN nl, LAD/LCX/RCA no significant dzs. EF 55-65%.  . S/P TAVR (transcatheter aortic valve replacement)    a. 09/2018 TAVR: 26 mm Edwards Sapien THV via the TF approach; b. 10/2018 Echo: EF 55-60%. Nl RV fxn. RVSP 20.73mmHg. Mild BAE. No AI or perivalvular leak.  . Severe aortic stenosis    Past Surgical History:  Procedure Laterality Date  . CARDIOVERSION N/A 01/20/2019   Procedure: CARDIOVERSION;  Surgeon: James Merritts, MD;  Location: [facility] ORS;  Service: Cardiovascular;  Laterality: N/A;  . COLONOSCOPY    . EYE SURGERY Bilateral    cataract  . HERNIA REPAIR Left    Inguinal  . NECK SURGERY  2011   Cervical Fusion  . RIGHT HEART CATH AND CORONARY ANGIOGRAPHY N/A 07/28/2018   Procedure: RIGHT HEART CATH AND CORONARY ANGIOGRAPHY;  Surgeon: James Merritts, MD;  Location: [place] [facility];  Service: Cardiovascular;  Laterality: N/A;  . TEE WITHOUT CARDIOVERSION N/A 09/13/2018   Procedure: TRANSESOPHAGEAL ECHOCARDIOGRAM (TEE);  Surgeon: James Mocha, MD;  Location: [place];  Service: Open Heart Surgery;  Laterality: N/A;  . TRANSCATHETER AORTIC VALVE REPLACEMENT, TRANSFEMORAL  09/13/2018  . TRANSCATHETER AORTIC VALVE REPLACEMENT, TRANSFEMORAL N/A 09/13/2018   Procedure: TRANSCATHETER AORTIC VALVE REPLACEMENT, TRANSFEMORAL;  Surgeon: James Mocha, MD;  Location: [place];  Service: Open Heart Surgery;  Laterality: N/A;     No outpatient medications have been marked as taking for the 04/30/20 encounter (Appointment) with James Chaco, PA-C.     Allergies:   Nickel   Social History   Tobacco Use  . Smoking status: Never Smoker  . Smokeless tobacco: Never Used  Vaping Use  . Vaping Use: Never used  Substance Use Topics  . Alcohol use: Yes    Alcohol/week: 7.0 standard drinks     Types: 1 Cans of beer, 6 Glasses of wine per week  . Drug use: No     Family Hx: The patient's family history includes Alzheimer's disease in his sister; Lung cancer in his father; Pancreatic cancer in his brother; Thyroid disease in his mother. There is no history of Prostate cancer, Bladder Cancer, or Kidney cancer.  ROS:   Please see the history of present illness.    *** All other systems reviewed and are negative.  Objective:    Vital Signs:  There were no vitals taken for this visit.   {HeartCare Virtual Exam (Optional):[phone]::"VITAL SIGNS:  reviewed"}  Accessory clinical findings reviewed:    EKG:  {EKG/Telemetry Strips Reviewed:[phone]}  Previous vitals reviewed today:    Temp Readings from Last 3 Encounters:  03/25/20 97.8 F (36.6 C) (Temporal)  02/14/20 (!) 97 F (36.1 C) (Tympanic)  09/22/19 (!) 97.3 F (36.3 C) (Temporal)   BP Readings from Last 3 Encounters:  03/25/20 (!) 116/51  02/14/20 138/84  09/22/19 138/75   Pulse Readings from Last 3 Encounters:  03/25/20  65  02/14/20 66  09/22/19 67    Wt Readings from Last 3 Encounters:  03/25/20 196 lb 9.6 oz (89.2 kg)  02/14/20 192 lb 6.4 oz (87.3 kg)  01/23/20 187 lb (84.8 kg)     Labs reviewed today:    [place] present? {yes/no:20286}  Lab Results  Component Value Date   WBC 3.9 (L) 02/14/2020   HGB 14.6 02/14/2020   HCT 42.7 02/14/2020   MCV 95.5 02/14/2020   PLT 183 02/14/2020   Lab Results  Component Value Date   CREATININE 0.93 03/18/2020   BUN 24 03/18/2020   NA 138 03/18/2020   K 4.8 03/18/2020   CL 104 03/18/2020   CO2 29 03/18/2020   Lab Results  Component Value Date   ALT 21 02/14/2020   AST 28 02/14/2020   GGT 26 07/25/2018   ALKPHOS 69 02/14/2020   BILITOT 1.7 (H) 02/14/2020   Lab Results  Component Value Date   CHOL 139 03/18/2020   HDL 66 03/18/2020   LDLCALC 58 03/18/2020   TRIG 66 03/18/2020   CHOLHDL 2.1 03/18/2020    Lab Results   Component Value Date   HGBA1C 5.5 03/18/2020   Lab Results  Component Value Date   TSH 2.066 12/16/2018     Prior CV Studies reviewed today:    The following studies were reviewed today:  Echo  09/2019 1. 26 mm S3 in aortic position. V max 2.7 m/s, MG 15 mmHG, EOA 1.26 cm2,  DI 0.33. Normal functioning TAVR without paravalvular leak or  regurgitation. Gradients are unchanged from prior. The aortic valve has  been repaired/replaced. Aortic valve  regurgitation is not visualized.  2. Left ventricular ejection fraction, by estimation, is 60 to 65%. The  left ventricle has normal function. The left ventricle has no regional  wall motion abnormalities. There is moderate concentric left ventricular  hypertrophy. Left ventricular  diastolic parameters are consistent with Grade II diastolic dysfunction  (pseudonormalization). Elevated left atrial pressure.  3. Right ventricular systolic function is normal. The right ventricular  size is normal. There is normal pulmonary artery systolic pressure. The  estimated right ventricular systolic pressure is 80.9 mmHg.  4. Left atrial size was mildly dilated.  5. The mitral valve is grossly normal. Trivial mitral valve  regurgitation. No evidence of mitral stenosis.  6. The inferior vena cava is normal in size with greater than 50%  respiratory variability, suggesting right atrial pressure of 3 mmHg.  Comparison(s): No significant change from prior study.   Carotid US 09/2019 Summary:  Right Carotid: Velocities in the right ICA are consistent with a 1-39%  stenosis.  Left Carotid: Velocities in the left ICA are consistent with a 40-59%  stenosis.  Vertebrals: Bilateral vertebral arteries demonstrate antegrade flow.  Subclavians: Normal flow hemodynamics were seen in bilateral subclavian        arteries.  *See table(s) above for measurements and observations.  Suggest follow up study in 12 months.   [place] 12/8336  LV end  diastolic pressure is normal.  Hemodynamic findings consistent with pulmonary hypertension.  MPI 06/2018 See scan  ASSESSMENT & PLAN:    ***  COVID-19 Education: The signs and symptoms of COVID-19 were discussed with the patient and how to seek care for testing (follow up with PCP or arrange E-visit).  ***The importance of social distancing was discussed today.  Time:   Today, I have spent *** minutes with the patient with telehealth technology discussing the above problems.  Medication Adjustments/Labs and Tests Ordered: Current medicines are reviewed at length with the patient today.  Concerns regarding medicines are outlined above.   Medication changes: *** Labs ordered: *** Studies / Imaging ordered: *** Future considerations: *** Disposition: {F/U Format:[phone]} {follow up:15908}  Signed, James Chaco, PA-C  04/29/2020  [facility]

## 2020-04-30 ENCOUNTER — Telehealth (INDEPENDENT_AMBULATORY_CARE_PROVIDER_SITE_OTHER): Payer: Medicare Other | Admitting: Physician Assistant

## 2020-04-30 ENCOUNTER — Encounter: Payer: Self-pay | Admitting: Physician Assistant

## 2020-04-30 ENCOUNTER — Other Ambulatory Visit: Payer: Self-pay

## 2020-04-30 VITALS — BP 172/99 | Ht 75.0 in | Wt 196.0 lb

## 2020-04-30 DIAGNOSIS — Z952 Presence of prosthetic heart valve: Secondary | ICD-10-CM | POA: Diagnosis not present

## 2020-04-30 DIAGNOSIS — I350 Nonrheumatic aortic (valve) stenosis: Secondary | ICD-10-CM | POA: Diagnosis not present

## 2020-04-30 DIAGNOSIS — I10 Essential (primary) hypertension: Secondary | ICD-10-CM

## 2020-04-30 DIAGNOSIS — E8779 Other fluid overload: Secondary | ICD-10-CM | POA: Diagnosis not present

## 2020-04-30 DIAGNOSIS — I5033 Acute on chronic diastolic (congestive) heart failure: Secondary | ICD-10-CM | POA: Diagnosis not present

## 2020-04-30 DIAGNOSIS — I4891 Unspecified atrial fibrillation: Secondary | ICD-10-CM

## 2020-04-30 DIAGNOSIS — I998 Other disorder of circulatory system: Secondary | ICD-10-CM | POA: Diagnosis not present

## 2020-04-30 NOTE — Patient Instructions (Signed)
Medication Instructions:  Your physician has recommended you make the following change in your medication:   TAKE Lasix 20mg  one tablet daily for ONLY 3 DAYS THEN STOP Beginning today 1/18, 1/19, 1/20. Last dose to be 1/20. DO NOT TAKE morning of follow up visit Friday 05/03/20.  *If you need a refill on your cardiac medications before your next appointment, please call your pharmacy*   Lab Work:  -  Your physician recommends that have lab work Friday 05/03/20 PRIOR to office visit. Please arrive at the [place] at [facility] to have labs: Bmet, CBC, TSH, BNP -  Please go to the [facility]. You will check in at the front desk to the right as you walk into the atrium. Valet Parking is offered if needed. - No appointment needed. You may go any day between 7 am and 6 pm.    Testing/Procedures: Your physician has requested that you have an echocardiogram. Echocardiography is a painless test that uses sound waves to create images of your heart. It provides your doctor with information about the size and shape of your heart and how well your heart's chambers and valves are working. This procedure takes approximately one hour. There are no restrictions for this procedure.    Follow-Up: At [facility], you and your health needs are our priority.  As part of our continuing mission to provide you with exceptional heart care, we have created designated Provider Care Teams.  These Care Teams include your primary Cardiologist (physician) and Advanced Practice Providers (APPs -  Physician Assistants and Nurse Practitioners) who all work together to provide you with the care you need, when you need it.  We recommend signing up for the patient portal called "MyChart".  Sign up information is provided on this After Visit Summary.  MyChart is used to connect with patients for Virtual Visits (Telemedicine).  Patients are able to view lab/test results, encounter notes, upcoming appointments, etc.   Non-urgent messages can be sent to your provider as well.   To learn more about what you can do with MyChart, go to [email or web address].    Your next appointment:   Friday 05/03/20 at 9:00 AM  The format for your next appointment:   In Person  Provider:   Marrianne Mood, PA-C   Other Instructions  Monitor daily weight

## 2020-05-02 ENCOUNTER — Encounter: Payer: Self-pay | Admitting: Physician Assistant

## 2020-05-02 DIAGNOSIS — M722 Plantar fascial fibromatosis: Secondary | ICD-10-CM

## 2020-05-02 NOTE — Telephone Encounter (Signed)
This encounter was created in error - please disregard.

## 2020-05-03 ENCOUNTER — Ambulatory Visit (INDEPENDENT_AMBULATORY_CARE_PROVIDER_SITE_OTHER): Payer: Medicare Other | Admitting: Physician Assistant

## 2020-05-03 ENCOUNTER — Telehealth: Payer: Self-pay | Admitting: *Deleted

## 2020-05-03 ENCOUNTER — Other Ambulatory Visit: Payer: Self-pay

## 2020-05-03 ENCOUNTER — Encounter: Payer: Self-pay | Admitting: Physician Assistant

## 2020-05-03 ENCOUNTER — Other Ambulatory Visit
Admission: RE | Admit: 2020-05-03 | Discharge: 2020-05-03 | Disposition: A | Discharge status: Home / Self Care | Payer: Medicare Other | Attending: Physician Assistant | Admitting: Physician Assistant

## 2020-05-03 VITALS — BP 198/108 | HR 83 | Ht 74.0 in | Wt 200.0 lb

## 2020-05-03 DIAGNOSIS — I779 Disorder of arteries and arterioles, unspecified: Secondary | ICD-10-CM | POA: Diagnosis not present

## 2020-05-03 DIAGNOSIS — Z952 Presence of prosthetic heart valve: Secondary | ICD-10-CM | POA: Diagnosis not present

## 2020-05-03 DIAGNOSIS — I5033 Acute on chronic diastolic (congestive) heart failure: Secondary | ICD-10-CM | POA: Diagnosis not present

## 2020-05-03 DIAGNOSIS — R911 Solitary pulmonary nodule: Secondary | ICD-10-CM | POA: Diagnosis not present

## 2020-05-03 DIAGNOSIS — I10 Essential (primary) hypertension: Secondary | ICD-10-CM | POA: Insufficient documentation

## 2020-05-03 DIAGNOSIS — F32A Depression, unspecified: Secondary | ICD-10-CM

## 2020-05-03 DIAGNOSIS — I998 Other disorder of circulatory system: Secondary | ICD-10-CM

## 2020-05-03 DIAGNOSIS — I350 Nonrheumatic aortic (valve) stenosis: Secondary | ICD-10-CM | POA: Diagnosis not present

## 2020-05-03 DIAGNOSIS — E785 Hyperlipidemia, unspecified: Secondary | ICD-10-CM | POA: Diagnosis not present

## 2020-05-03 DIAGNOSIS — D472 Monoclonal gammopathy: Secondary | ICD-10-CM | POA: Diagnosis not present

## 2020-05-03 DIAGNOSIS — I4891 Unspecified atrial fibrillation: Secondary | ICD-10-CM | POA: Diagnosis not present

## 2020-05-03 DIAGNOSIS — I272 Pulmonary hypertension, unspecified: Secondary | ICD-10-CM

## 2020-05-03 DIAGNOSIS — N3281 Overactive bladder: Secondary | ICD-10-CM | POA: Diagnosis not present

## 2020-05-03 DIAGNOSIS — E8779 Other fluid overload: Secondary | ICD-10-CM | POA: Diagnosis not present

## 2020-05-03 DIAGNOSIS — F419 Anxiety disorder, unspecified: Secondary | ICD-10-CM

## 2020-05-03 LAB — BASIC METABOLIC PANEL
Anion gap: 7 (ref 5–15)
BUN: 23 mg/dL (ref 8–23)
CO2: 28 mmol/L (ref 22–32)
Calcium: 9.4 mg/dL (ref 8.9–10.3)
Chloride: 105 mmol/L (ref 98–111)
Creatinine, Ser: 0.86 mg/dL (ref 0.61–1.24)
GFR, Estimated: >60 mL/min (ref >60)
Glucose, Bld: 122 mg/dL — ABNORMAL HIGH (ref 70–99)
Potassium: 4.3 mmol/L (ref 3.5–5.1)
Sodium: 140 mmol/L (ref 135–145)

## 2020-05-03 LAB — CBC
HCT: 41.5 % (ref 39.0–52.0)
Hemoglobin: 14 g/dL (ref 13.0–17.0)
MCH: 32.8 pg (ref 26.0–34.0)
MCHC: 33.7 g/dL (ref 30.0–36.0)
MCV: 97.2 fL (ref 80.0–100.0)
Platelets: 187 10*3/uL (ref 150–400)
RBC: 4.27 MIL/uL (ref 4.22–5.81)
RDW: 13.4 % (ref 11.5–15.5)
WBC: 4.8 10*3/uL (ref 4.0–10.5)
nRBC: 0 % (ref 0.0–0.2)

## 2020-05-03 LAB — BRAIN NATRIURETIC PEPTIDE: B Natriuretic Peptide: 197.3 pg/mL — ABNORMAL HIGH (ref 0.0–100.0)

## 2020-05-03 LAB — TSH: TSH: 2.479 u[IU]/mL (ref 0.350–4.500)

## 2020-05-03 MED ORDER — CLONIDINE HCL 0.1 MG PO TABS
0.1000 mg | ORAL_TABLET | Freq: Once | ORAL | Status: AC
  Administered 2020-05-03: 0.1 mg via ORAL

## 2020-05-03 MED ORDER — FUROSEMIDE 20 MG PO TABS: 20.0000 mg | ORAL_TABLET | Freq: Every day | ORAL | 2 refills | Status: DC

## 2020-05-03 MED ORDER — CLONIDINE HCL 0.1 MG PO TABS: 0.1000 mg | ORAL_TABLET | Freq: Three times a day (TID) | ORAL | 0 refills | Status: DC | PRN

## 2020-05-03 MED ORDER — AMLODIPINE BESYLATE 2.5 MG PO TABS: 2.5000 mg | ORAL_TABLET | Freq: Every day | ORAL | 2 refills | Status: DC

## 2020-05-03 NOTE — Telephone Encounter (Signed)
Patient calling  States he would like to clarify information about the new medications before he picks them up today Please call to discuss

## 2020-05-03 NOTE — Telephone Encounter (Signed)
Since patient has been home today. His BP has come all the way down to 120/68. He is concerned that if his BP stays low is it ok to take the amlodipine. Advised that the amlodipine is the lowest dose and his current reading should tolerate the new medication. Patient verbalized understanding and will start the new regimen.

## 2020-05-03 NOTE — Telephone Encounter (Signed)
Arvil Chaco, PA-C  05/03/2020 1:39 PM EST Back to Top     START amlodipine 2.5 mg daily.   Continue other medications as discussed in clinic.   Arvil Chaco, PA-C  05/03/2020 1:37 PM EST      Labs all very reassuring. --Thyroid function normal. --Renal function stable. K+ WNL. --Fluid lab mildly elevated -we can trend this value while on your Lasix to see it improve. --Blood counts all normal.  Recommendations: --Continue current Lasix 20 mg daily.  --START amlodipine 2.5 mg daily.  --Continue as needed clonidine 0.1 mg for SBP at or above 160 mmHg.  Call the office if: --SBP > 182mmHg once taking amlodipine 2.5mg  qd. We will increase your amlodipine dose for better control at that time and based on your home vitals in your log. --You need clonidine more than once or twice a week. This should be your "emergency medication" to control outlier high BP, rather than a daily medication. Always be certain you check your BP at least twice to confirm a high value.  I reached out to the TAVR team to keep them in the loop. We will try to move up your echo if possible, so that we can get you in sooner than currently scheduled. We will let you know if a spot opens.

## 2020-05-03 NOTE — Telephone Encounter (Signed)
Patient verbalized understanding of the results and recommendations.  He verbalized the medications of amlodipine, lasix and clonidine back to me. He is aware he can see the information on MyChart as well. He will plan to pick up the new medication once the pharmacy calls back.   Med list updated.

## 2020-05-03 NOTE — Progress Notes (Signed)
Office Visit    Patient Name: James Cardenas Date of Encounter: 05/03/2020  Primary Care Provider:  Olin Hauser, DO Primary Cardiologist:  Ida Rogue, MD  Chief Complaint    Chief Complaint  Patient presents with  . OTHER    Medications verbally reviewed with patient.     80 y.o. male with history of nonobstructive CAD,  TAVR6/2020 for severe AS and moderate AI, AT / atypical atrial flutter with DCCV 01/2019, mild carotid artery dz, HTN, overactive bladder, MGUS, pulmonary HTN, pulmonary nodules, GERD, neuropathy and mild gait imbalance, and seen today for follow-up after initiation of low-dose diuretic to reassess his volume status and vitals.  Past Medical History    Past Medical History:  Diagnosis Date  . Anxiety   . Arthritis of knee   . Atypical atrial flutter ([place])    a. Dx 12/15/2018. CHA2DS2VASc 3-->eliquis.  . Carotid artery disease ([place])   . 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 Hearing loss    wears hearing aids  . Hypertension   . Neuropathy   . Nonobstructive Coronary artery disease    a. 07/2018 Cath: LMN nl, LAD/LCX/RCA no significant dzs. EF 55-65%.  . S/P TAVR (transcatheter aortic valve replacement)    a. 09/2018 TAVR: 26 mm Edwards Sapien THV via the TF approach; b. 10/2018 Echo: EF 55-60%. Nl RV fxn. RVSP 20.48mmHg. Mild BAE. No AI or perivalvular leak.  . Severe aortic stenosis    Past Surgical History:  Procedure Laterality Date  . CARDIOVERSION N/A 01/20/2019   Procedure: CARDIOVERSION;  Surgeon: Minna Merritts, MD;  Location: [facility];  Service: Cardiovascular;  Laterality: N/A;  . COLONOSCOPY    . EYE SURGERY Bilateral    cataract  . HERNIA REPAIR Left    Inguinal  . NECK SURGERY  2011   Cervical Fusion  . RIGHT HEART CATH AND CORONARY ANGIOGRAPHY N/A 07/28/2018   Procedure: RIGHT HEART CATH AND CORONARY ANGIOGRAPHY;  Surgeon: Minna Merritts, MD;  Location: [place] [facility];  Service: Cardiovascular;  Laterality:  N/A;  . TEE WITHOUT CARDIOVERSION N/A 09/13/2018   Procedure: TRANSESOPHAGEAL ECHOCARDIOGRAM (TEE);  Surgeon: Sherren Mocha, MD;  Location: [place];  Service: Open Heart Surgery;  Laterality: N/A;  . TRANSCATHETER AORTIC VALVE REPLACEMENT, TRANSFEMORAL  09/13/2018  . TRANSCATHETER AORTIC VALVE REPLACEMENT, TRANSFEMORAL N/A 09/13/2018   Procedure: TRANSCATHETER AORTIC VALVE REPLACEMENT, TRANSFEMORAL;  Surgeon: Sherren Mocha, MD;  Location: [place];  Service: Open Heart Surgery;  Laterality: N/A;    Allergies  Allergies  Allergen Reactions  . Nickel Itching    History of Present Illness    James Cardenas is a 80 y.o. male with PMH as above. He reports (and has reported in the past)  frustration that he was active in the past as an adult, playing competitive tennis, biking, and routinely exercising. Starting in early 2020, he developed exertional fatigue and chest tightness, as well as ankle and pedal edema. His activity level/ dropped and increased sedentary lifestyle noted. He was not as active as in the past, due to his slow peripher. al neuropathy affecting his balance, as well as sx as above. Subsequent 04/29/2018 echo showed severely calcified aortic valve with mean gradient of 37 mm HG and peak gradient of 62 mm HG, moderate AI, LVEF 55% with moderate LVH. He then underwent R/L cardiac cath 07/28/2018 for pre-TAVR workup that showed no significant obstructive CAD and normal right heart pressures. Carotid US  with mild dz. Chest CT with small pulmonary nodules.  He underwent successful TAVR 09/2018 for AS/AI with improvement in sx / exercise . Repeat echo showed nl EF without AS/AI/perivalvular leak.   At the end of cardiac rehab, he was found to have new atrial flutter with RVR. He started Eliquis and diltiazem. At RTC, he noted dizziness and hypotension with diltiazem. He was advised to discontinue losartan and continue diltiazem. Initially, it was noted he did not wish to start diltiazem and  instead continued losartan. Due to ongoing elevated rates in the 110s at RTC on diltiazem, he was started on digoxin.He then underwent DCCV 01/20/19.  At RTC, he was in NSR and noted significant side effects with both diltiazem and digoxin. Both medications were discontinued. Per EMR, at RTC with primary cardiologist, decision was made to stop OAC after 1 month and as documented by Dr. Mariah Milling due to cost and anxiety surrounding the medication. He was started on ASA.   Last seen by the structural heart team 09/2019 with chronic mild LEE. He reported he was selling his house and moving to an apartment, as well as giving away his dog. He was being active and walking with his other dog.   At RTC with primary cardiologist, Dr. Mariah Milling, he again declined [facility] due to anxiety surrounding ADRs. He also declined diuresis with lasix, recommended at time of visit per EMR.   He called the office 04/2020 with flu like sx (treating with OTC medications) and fluctuating BP, attributed to coricidin, delsym, and alka seltzer plus. He reported elevated BP when sx were at their worse. He had not seen his PCP. He had not been tested for flu or COVID-19. Recommendation was to discontinue OTC medications and reach out to his PCP. COVID-19 test recommended but declined per EMR. He also indicated preference to continue his current OTC medications.   Visit today is for ongoing labile BP. Ongoing since the 7th  Seen via telemedicine/telephone only visit 04/30/2020 for ongoing cold symptoms and elevated BP.  OTC medications were discontinued at that time.  He reported having cold symptoms since 1/7 after seeing his family over the holidays, which included his son, spending cold.  A day later, he started to experience cold symptoms, including sneezing and runny nose that were persistent.  He had not yet tested for COVID-19 or flu.  With worsening of symptoms, he also noted elevated BP.  He reviewed a detailed log of his blood pressure.   On further questioning, it was revealed that he has also been gaining weight without change in diet.  Suspicion was expressed regarding his possible retention of fluid.  He was agreeable to restart Lasix 20 mg daily plan at that time was for him to diurese until follow-up on 05/03/2020.  Today, 05/03/2020, he returns to clinic and notes that he significantly improved with initiation of low-dose Lasix 20 mg daily.  He reports that he immediately noticed an increase in urination and weight loss, followed by improvement in his symptoms.  SBP improved with lasix and further improved as his anxiety improved. He lost 4.5 lbs on home scale.  He was so excited regarding the improvement in his symptoms that he subsequently decided to celebrate with his friends. Specifically, he decided to celebrate with a restaurant take out meal and wine. He reports that he usually only drinks 1/2 glass of wine every rare occasion. On the night before his visit today, he reports drinking 1 bottle of wine.  After drinking  this wine, he reports that he noticed his BP again increase and an a 'back step" in his previous improvement.   He continues to report concern for elevated blood pressure, especially given his SBP in the past has always been [id number] systolic.  DBP usually low 70s.  He reports low/well-controlled BP for 20+ years.  He reports between 7-10,000 steps per day.  He has not yet started biking but intends to do so.    No chest pain or shortness of breath.  No dyspnea.  No racing heart rate or palpitations.  No presyncope or syncope.  He does note chronic swelling in the legs and feet, confirmed on exam today. No signs or symptoms of bleeding. He notes that he continues on ASA 81 mg daily with recommendation per Dr. Rockey Situ for as needed Eliquis if tachypalpitations.  We did discuss the indication for Eliquis to clarify its importance. The patient does have symptoms concerning for COVID-19 infection (fever, chills, cough, or  new shortness of breath).   BP today was so elevated that he required clonidine 0.1mg  at the start of the visit.  Home Medications    Current Outpatient Medications on File Prior to Visit  Medication Sig Dispense Refill  . Alpha-Lipoic Acid 600 MG CAPS Take 600 mg by mouth every morning.     Marland Kitchen ALPRAZolam (XANAX) 0.5 MG tablet TAKE 1 TABLET BY MOUTH TWICE A DAY AS NEEDED FOR ANXIETY 60 tablet 1  . aspirin EC 81 MG tablet Take 1 tablet (81 mg total) by mouth daily. 90 tablet 3  . Azelaic Acid 15 % cream Apply 1 application topically daily. After skin is thoroughly washed and patted dry, gently but thoroughly massage a thin film of azelaic acid cream into the affected area once daily    . Carboxymethylcellul-Glycerin (LUBRICATING EYE DROPS OP) Place 1 drop into both eyes 2 (two) times a day.    . cholecalciferol (VITAMIN D3) 25 MCG (1000 UT) tablet Take 1,000 Units by mouth daily.    Marland Kitchen losartan (COZAAR) 100 MG tablet TAKE 1 TABLET BY MOUTH EVERY DAY 90 tablet 2  . Multiple Vitamin (MULTIVITAMIN WITH MINERALS) TABS tablet Take 1 tablet by mouth daily in the afternoon. Centrum    . MYRBETRIQ 50 MG TB24 tablet TAKE 1 TABLET BY MOUTH EVERY DAY 30 tablet 5  . neomycin-polymyxin-hydrocortisone (CORTISPORIN) 3.5-10000-1 OTIC suspension Apply 1-2 drops daily after soaking and cover with bandaid 10 mL 0  . OVER THE COUNTER MEDICATION Take 1 tablet by mouth at bedtime as needed (sleep). GNC PREVENTIVE NUTRITION TRI-SLEEP    . polyethylene glycol (MIRALAX / GLYCOLAX) packet Take 17 g by mouth 2 (two) times daily as needed (constipation).     . rosuvastatin (CRESTOR) 10 MG tablet TAKE 1 TABLET BY MOUTH EVERY DAY 90 tablet 3  . tretinoin (RETIN-A) 0.1 % cream Apply 1 application topically at bedtime.   0  . triamcinolone cream (KENALOG) 0.1 % APPLY TWICE DAILY TO AFFECTED RASH AS NEEDED     No current facility-administered medications on file prior to visit.    Review of Systems    He denies chest pain,  palpitations, dyspnea, pnd, orthopnea, n, v, dizziness, syncope.Marland Kitchen He reports bilateral LEE, fatigue, early satiety, DOE/SOB, weight gain, as well as ongoing sx associated with his cold. Sx of SOB/DOE, LEE, fatigue, wt gain, and early satiety  initially had an improvement (especially the LEE and wt gain) after starting lasix; however, after wine and a dinner out, he notice  return of symptoms the following day.  BP usually improves with anxiety and lasix. All other systems reviewed and are otherwise negative except as noted above.  Physical Exam    VS:  BP (!) 198/108 (BP Location: Left Arm, Patient Position: Sitting, Cuff Size: Normal)   Pulse 83   Ht 6\' 2"  (1.88 m)   Wt 200 lb (90.7 kg)   SpO2 97%   BMI 25.68 kg/m  , BMI Body mass index is 25.68 kg/m. GEN: Well nourished, well developed, in no acute distress. HEENT: normal. Neck: Supple, JVP ~10cm. No carotid bruits, or masses. Cardiac: RRR, 2/6 systolic murmur best appreciated RUSB, 1/6 LLSB diastolic murmur, no rubs or gallops. No clubbing, cyanosis, 2-3+ bilateral edema.  Radials/DP/PT 2+ and equal bilaterally.  Respiratory:  Respirations regular and unlabored, faint bibasilar crackles. GI: Soft, nontender, nondistended, BS + x 4. MS: no deformity or atrophy. Skin: warm and dry, no rash. Neuro:  Strength and sensation are intact. Psych: Normal affect.  Accessory Clinical Findings    ECG personally reviewed by me today -sinus rhythm with first-degree AV block, right bundle branch block, 83 bpm, PR interval 224 ms, QRS 162, QTc 46- no acute changes.  VITALS Reviewed today   Temp Readings from Last 3 Encounters:  03/25/20 97.8 F (36.6 C) (Temporal)  02/14/20 (!) 97 F (36.1 C) (Tympanic)  09/22/19 (!) 97.3 F (36.3 C) (Temporal)   BP Readings from Last 3 Encounters:  05/03/20 (!) 198/108  04/30/20 (!) 172/99  03/25/20 (!) 116/51   Pulse Readings from Last 3 Encounters:  05/03/20 83  03/25/20 65  02/14/20 66    Wt  Readings from Last 3 Encounters:  05/03/20 200 lb (90.7 kg)  04/30/20 196 lb (88.9 kg)  03/25/20 196 lb 9.6 oz (89.2 kg)     LABS  reviewed today   Lab Results  Component Value Date   WBC 4.8 05/03/2020   HGB 14.0 05/03/2020   HCT 41.5 05/03/2020   MCV 97.2 05/03/2020   PLT 187 05/03/2020   Lab Results  Component Value Date   CREATININE 0.86 05/03/2020   BUN 23 05/03/2020   NA 140 05/03/2020   K 4.3 05/03/2020   CL 105 05/03/2020   CO2 28 05/03/2020   Lab Results  Component Value Date   ALT 21 02/14/2020   AST 28 02/14/2020   GGT 26 07/25/2018   ALKPHOS 69 02/14/2020   BILITOT 1.7 (H) 02/14/2020   Lab Results  Component Value Date   CHOL 139 03/18/2020   HDL 66 03/18/2020   LDLCALC 58 03/18/2020   TRIG 66 03/18/2020   CHOLHDL 2.1 03/18/2020    Lab Results  Component Value Date   HGBA1C 5.5 03/18/2020   Lab Results  Component Value Date   TSH 2.479 05/03/2020     STUDIES/PROCEDURES reviewed today   Echo  09/2019 1. 26 mm S3 in aortic position. V max 2.7 m/s, MG 15 mmHG, EOA 1.26 cm2,  DI 0.33. Normal functioning TAVR without paravalvular leak or  regurgitation. Gradients are unchanged from prior. The aortic valve has  been repaired/replaced. Aortic valve  regurgitation is not visualized.  2. Left ventricular ejection fraction, by estimation, is 60 to 65%. The  left ventricle has normal function. The left ventricle has no regional  wall motion abnormalities. There is moderate concentric left ventricular  hypertrophy. Left ventricular  diastolic parameters are consistent with Grade II diastolic dysfunction  (pseudonormalization). Elevated left atrial pressure.  3. Right ventricular  systolic function is normal. The right ventricular  size is normal. There is normal pulmonary artery systolic pressure. The  estimated right ventricular systolic pressure is 99.3 mmHg.  4. Left atrial size was mildly dilated.  5. The mitral valve is grossly normal.  Trivial mitral valve  regurgitation. No evidence of mitral stenosis.  6. The inferior vena cava is normal in size with greater than 50%  respiratory variability, suggesting right atrial pressure of 3 mmHg.  Comparison(s): No significant change from prior study.   Carotid US 09/2019 Summary:  Right Carotid: Velocities in the right ICA are consistent with a 1-39%  stenosis.  Left Carotid: Velocities in the left ICA are consistent with a 40-59%  stenosis.  Vertebrals: Bilateral vertebral arteries demonstrate antegrade flow.  Subclavians: Normal flow hemodynamics were seen in bilateral subclavian        arteries.  *See table(s) above for measurements and observations.  Suggest follow up study in 12 months.   [facility] 07/2018 Final Conclusions:  No significant coronary artery disease Pulmonary HTN Unable to cross aortic valve for pressures or LV function Grossly normal right heart pressures Recommendations:  Given the findings above, we will refer to TAVR clinic in [facility] No complications    ASSESSMENT & PLAN:    Acute on chronic HFpEF  Pulmonary hypertension G2DD, moderate concentric LVH -- Reports signs and symptoms consistent with volume overload, though significantly improved since start of lasix.   Previous echo as above with normal EF and G2 DD, moderate concentric LVH.  Reviewed salt and fluid restrictions. Still volume up. Continue lasix 20mg  daily. Further recommendations pending labs collected today. Obtain CBC, BMET, BNP.  For additional BP support, will start on amlodipine with PRN clonidine as outlined directly below.  Hypertension, poorly controlled  -- Goal BP 130/80.  As above, discontinue OTC medications other than Coricidin and cough drops.  Reviewed guidelines for total salt of 2 g/day and total fluid of 2 L/day.  Suspect elevated BP is likely 2/2 volume overload and at times worsened by anxiety. Continue lasix 20mg  daily. Start amlodipine 2.5mg   daily and likely to be further escalated depending on response. Take clonidine 0.1mg  on PRN basis with SBP greater than 160 as discussed with primary cardiologist today. Case also discussed with TAVR team to keep in the loop. Recommend updating echo as above to reassess EF, heart pressures, and valvular function in the setting of possible COVID-19 and elevated BP with TAVR. Loud murmur on exam.   Atrial flutter/fibrillation with RVR s/p DCCV 01/2019 -- No report of tachypalpitations.  Na DCCV 01/20/2019.  Reports rates well controlled.  Not currently on rate control medication.  He noted adverse effects 2/2 diltiazem, as well as digoxin.  He has since been taken off of these medications.  In the past, beta-blocker has been deferred due to bradycardic rates at home.  Per patient preference, he is not on anticoagulation and instead remains on ASA 81 mg daily. CHA2DS2VASc score of at least5 (Pulmonary HTN, agex2, vascular) with recommendation for daily OAC with Eliquis 5 mg twice daily as he does not meet reduced dosing criteria and to prevent risk of thromboembolic event. Continue to recommend anticoagulation instead of ASA 81 mg daily, as discussed.  Recommend CBC, BMET, TSH.   AS s/p TAVR --  S/p TAVR 09/2018.  Previous echo showed normal LV function, as well as normal device function without significant aortic insufficiency or perivalvular leak.  SBE prophylaxis recommended with patient report that he takes an  antibiotic before his dentist.  As above, continue to recommend Eliquis with patient preference to instead take ASA.  Recommend BP and heart rate control.  Discussed the stress that elevated BP and volume status places on his TAVR.  Given his volume overload, recommend update echo as above.  Continue to follow-up with the TAVR team as recommended. Personally also contacted the TAVR team to make aware and will send them his echo results.  HLD, LDL goal below 70 --Continue statin.  Continue to  monitor with periodic lipid and liver function per PCP.   Arvil Chaco, PA-C 05/03/2020

## 2020-05-03 NOTE — Patient Instructions (Signed)
Medication Instructions:  Your physician has recommended you make the following change in your medication:  1- TAKE Clonidine 0.1 mg (1 tablet) by mouth every 8 hours as needed for Systolic blood pressure (top number) greater than or equal to 160.   *If you need a refill on your cardiac medications before your next appointment, please call your pharmacy*  Lab Work: Your physician recommends that you return for lab work in: [place] [facility] [employer] [facility] as you are leaving. - CBC, TSH, CMP, BNP. - Please go to the [facility]. You will check in at the front desk to the right as you walk into the atrium. Valet Parking is offered if needed. - No appointment needed. You may go any day between 7 am and 6 pm.  If you have labs (blood work) drawn today and your tests are completely normal, you will receive your results only by: Marland Kitchen MyChart Message (if you have MyChart) OR . A paper copy in the mail If you have any lab test that is abnormal or we need to change your treatment, we will call you to review the results.   Testing/Procedures: none  Follow-Up: At [facility], you and your health needs are our priority.  As part of our continuing mission to provide you with exceptional heart care, we have created designated Provider Care Teams.  These Care Teams include your primary Cardiologist (physician) and Advanced Practice Providers (APPs -  Physician Assistants and Nurse Practitioners) who all work together to provide you with the care you need, when you need it.  We recommend signing up for the patient portal called "MyChart".  Sign up information is provided on this After Visit Summary.  MyChart is used to connect with patients for Virtual Visits (Telemedicine).  Patients are able to view lab/test results, encounter notes, upcoming appointments, etc.  Non-urgent messages can be sent to your provider as well.   To learn more about what you can do with MyChart, go to [email or web address].     Your next appointment:   2 week(s)  The format for your next appointment:   In Person  Provider:   You may see Ida Rogue, MD or one of the following Advanced Practice Providers on your designated Care Team:    Murray Hodgkins, NP  Christell Faith, PA-C  Marrianne Mood, PA-C  [place], [place]  Laurann Montana, NP

## 2020-05-05 ENCOUNTER — Telehealth: Payer: Self-pay | Admitting: Physician Assistant

## 2020-05-05 NOTE — Telephone Encounter (Signed)
    He was recently seen in the office on 05/03/2020 with blood pressure 267 systolic and was given clonidine in the office.  Labs were obtained at that time with result note on 1/21 indicating he was to start taking Lasix 20 mg daily and amlodipine 2.5 mg daily with continuation of as needed clonidine for systolic blood pressure greater than 160 mmHg.  He started these medications at that time.  He called the answering service this afternoon with concerns regarding his blood pressure after re-reading his result note from his recent labs.  He indicated after reading the recommendations for systolic blood pressure greater than 130 we may need to increase his amlodipine he became anxious and checked his blood pressure.  It was noted to be 124 systolic.  He took one of his as needed clonidine and contacted our office for further recommendations.  He indicates his blood pressure since being seen in the office last week has been in the 580D systolic on the recently started amlodipine 2.5 mg and Lasix with continuation of losartan.  Since he has already taken a clonidine prior to contacting our office I advised for him to increase his amlodipine to 5 mg daily starting tomorrow, 9/83/3825 for systolic blood pressure greater than 130.  This note will be routed to the provider that last saw him for follow-up and refills of his amlodipine as he will run out sooner.

## 2020-05-06 ENCOUNTER — Other Ambulatory Visit: Payer: Self-pay | Admitting: Urology

## 2020-05-07 MED ORDER — AMLODIPINE BESYLATE 5 MG PO TABS: 5.0000 mg | ORAL_TABLET | Freq: Every day | ORAL | 3 refills | Status: DC

## 2020-05-07 NOTE — Telephone Encounter (Signed)
Thank you!!  Jessie Schrieber S Lillymae Duet, NP  

## 2020-05-07 NOTE — Telephone Encounter (Signed)
Attempted to call pt. No answer. Lmtcb.  

## 2020-05-07 NOTE — Addendum Note (Signed)
Addended by: Darlyne Russian on: 05/07/2020 02:07 PM   Modules accepted: Orders

## 2020-05-07 NOTE — Telephone Encounter (Signed)
Spoke to pt, he is currently take 2 tablets of current Amlodipine 2.5mg  for (5mg ) daily. He does request refill to be sent in for 5mg  tablet 90 day supply. Rx sent to [facility] in [place] for Amlodipine 5mg  daily - 90 day supply.  Pt also reports that since incr dose, his SBP is much improved "110s - 120s". He continues to check BP daily and will contact office if BP consistently greater than 355 systolic.

## 2020-05-16 ENCOUNTER — Ambulatory Visit (INDEPENDENT_AMBULATORY_CARE_PROVIDER_SITE_OTHER): Payer: Medicare Other

## 2020-05-16 ENCOUNTER — Other Ambulatory Visit: Payer: Self-pay

## 2020-05-16 DIAGNOSIS — I5033 Acute on chronic diastolic (congestive) heart failure: Secondary | ICD-10-CM | POA: Diagnosis not present

## 2020-05-16 LAB — ECHOCARDIOGRAM COMPLETE
AR max vel: 0.74 cm2
AV Area VTI: 0.76 cm2
AV Area mean vel: 0.69 cm2
AV Mean grad: 15 mmHg
AV Peak grad: 25.5 mmHg
Ao pk vel: 2.53 m/s
Area-P 1/2: 3.76 cm2
S' Lateral: 3.1 cm

## 2020-05-17 ENCOUNTER — Ambulatory Visit (INDEPENDENT_AMBULATORY_CARE_PROVIDER_SITE_OTHER): Payer: Medicare Other | Admitting: Physician Assistant

## 2020-05-17 ENCOUNTER — Encounter: Payer: Self-pay | Admitting: Physician Assistant

## 2020-05-17 ENCOUNTER — Other Ambulatory Visit
Admission: RE | Admit: 2020-05-17 | Discharge: 2020-05-17 | Disposition: A | Discharge status: Home / Self Care | Payer: Medicare Other | Attending: Physician Assistant | Admitting: Physician Assistant

## 2020-05-17 ENCOUNTER — Other Ambulatory Visit: Payer: Self-pay

## 2020-05-17 VITALS — BP 150/80 | HR 78 | Ht 75.0 in | Wt 188.0 lb

## 2020-05-17 DIAGNOSIS — I779 Disorder of arteries and arterioles, unspecified: Secondary | ICD-10-CM | POA: Insufficient documentation

## 2020-05-17 DIAGNOSIS — E785 Hyperlipidemia, unspecified: Secondary | ICD-10-CM

## 2020-05-17 DIAGNOSIS — I10 Essential (primary) hypertension: Secondary | ICD-10-CM | POA: Insufficient documentation

## 2020-05-17 DIAGNOSIS — Z8679 Personal history of other diseases of the circulatory system: Secondary | ICD-10-CM | POA: Diagnosis not present

## 2020-05-17 DIAGNOSIS — I5033 Acute on chronic diastolic (congestive) heart failure: Secondary | ICD-10-CM | POA: Diagnosis not present

## 2020-05-17 DIAGNOSIS — I350 Nonrheumatic aortic (valve) stenosis: Secondary | ICD-10-CM | POA: Diagnosis not present

## 2020-05-17 DIAGNOSIS — Z952 Presence of prosthetic heart valve: Secondary | ICD-10-CM

## 2020-05-17 DIAGNOSIS — F418 Other specified anxiety disorders: Secondary | ICD-10-CM

## 2020-05-17 LAB — CBC
HCT: 42.8 % (ref 39.0–52.0)
Hemoglobin: 14.4 g/dL (ref 13.0–17.0)
MCH: 32.5 pg (ref 26.0–34.0)
MCHC: 33.6 g/dL (ref 30.0–36.0)
MCV: 96.6 fL (ref 80.0–100.0)
Platelets: 168 10*3/uL (ref 150–400)
RBC: 4.43 MIL/uL (ref 4.22–5.81)
RDW: 13 % (ref 11.5–15.5)
WBC: 5.6 10*3/uL (ref 4.0–10.5)
nRBC: 0 % (ref 0.0–0.2)

## 2020-05-17 LAB — BASIC METABOLIC PANEL
Anion gap: 10 (ref 5–15)
BUN: 29 mg/dL — ABNORMAL HIGH (ref 8–23)
CO2: 25 mmol/L (ref 22–32)
Calcium: 9 mg/dL (ref 8.9–10.3)
Chloride: 104 mmol/L (ref 98–111)
Creatinine, Ser: 0.78 mg/dL (ref 0.61–1.24)
GFR, Estimated: >60 mL/min (ref >60)
Glucose, Bld: 109 mg/dL — ABNORMAL HIGH (ref 70–99)
Potassium: 4.1 mmol/L (ref 3.5–5.1)
Sodium: 139 mmol/L (ref 135–145)

## 2020-05-17 LAB — BRAIN NATRIURETIC PEPTIDE: B Natriuretic Peptide: 162.8 pg/mL — ABNORMAL HIGH (ref 0.0–100.0)

## 2020-05-17 NOTE — Patient Instructions (Signed)
Medication Instructions:   Your physician recommends that you continue on your current medications as directed. Please refer to the Current Medication list given to you today.  *If you need a refill on your cardiac medications before your next appointment, please call your pharmacy*  PLEASE BRING YOUR BLOOD PRESSURE CUFFS TO YOUR NEXT OFFICE VISIT. WE CAN CHECK TO ENSURE THEY ARE ACCURATE.   Lab Work:  -  Your physician recommends that you have lab work TODAY at [facility] [employer] [facility]: BMET, CBC, BNP -  Please go to the [employer]. You will check in at the front desk to the right as you walk into the atrium.   If you have labs (blood work) drawn today and your tests are completely normal, you will receive your results only by: Marland Kitchen MyChart Message (if you have MyChart) OR . A paper copy in the mail If you have any lab test that is abnormal or we need to change your treatment, we will call you to review the results.   Testing/Procedures: None ordered   Follow-Up: At [facility], you and your health needs are our priority.  As part of our continuing mission to provide you with exceptional heart care, we have created designated Provider Care Teams.  These Care Teams include your primary Cardiologist (physician) and Advanced Practice Providers (APPs -  Physician Assistants and Nurse Practitioners) who all work together to provide you with the care you need, when you need it.  We recommend signing up for the patient portal called "MyChart".  Sign up information is provided on this After Visit Summary.  MyChart is used to connect with patients for Virtual Visits (Telemedicine).  Patients are able to view lab/test results, encounter notes, upcoming appointments, etc.  Non-urgent messages can be sent to your provider as well.   To learn more about what you can do with MyChart, go to [email or web address].    Your next appointment:   3 month(s)  The format for your next appointment:    In Person  Provider:   You may see Ida Rogue, MD or one of the following Advanced Practice Providers on your designated Care Team:     Marrianne Mood, PA-C   Other Instructions  PLEASE BRING YOUR BLOOD PRESSURE CUFFS TO YOUR NEXT OFFICE VISIT. WE CAN CHECK TO ENSURE THEY ARE ACCURATE.

## 2020-05-17 NOTE — Progress Notes (Signed)
Office Visit    Patient Name: James Cardenas Date of Encounter: 05/17/2020  Primary Care Provider:  Smitty Cords, DO Primary Cardiologist:  Julien Nordmann, MD  Chief Complaint    Chief Complaint  Patient presents with  . 2 week follow up    "doing well." Medications reviewed by the patient verbally.     80 y.o. male with history of nonobstructive CAD,  TAVR6/2020 for severe AS and moderate AI, AT / atypical atrial flutter with DCCV 01/2019, mild carotid artery dz, HTN, overactive bladder, MGUS, pulmonary HTN, pulmonary nodules, GERD, neuropathy and mild gait imbalance, and seen today for follow-up after initiation of low-dose diuretic to reassess his volume status and vitals.  Past Medical History    Past Medical History:  Diagnosis Date  . Anxiety   . Arthritis of knee   . Atypical atrial flutter (HCC)    a. Dx 12/15/2018. CHA2DS2VASc 3-->eliquis.  . Carotid artery disease (HCC)   . 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 Hearing loss    wears hearing aids  . Hypertension   . Neuropathy   . Nonobstructive Coronary artery disease    a. 07/2018 Cath: LMN nl, LAD/LCX/RCA no significant dzs. EF 55-65%.  . S/P TAVR (transcatheter aortic valve replacement)    a. 09/2018 TAVR: 26 mm Edwards Sapien THV via the TF approach; b. 10/2018 Echo: EF 55-60%. Nl RV fxn. RVSP 20.103mmHg. Mild BAE. No AI or perivalvular leak.  . Severe aortic stenosis    Past Surgical History:  Procedure Laterality Date  . CARDIOVERSION N/A 01/20/2019   Procedure: CARDIOVERSION;  Surgeon: Antonieta Iba, MD;  Location: [facility] ORS;  Service: Cardiovascular;  Laterality: N/A;  . COLONOSCOPY    . EYE SURGERY Bilateral    cataract  . HERNIA REPAIR Left    Inguinal  . NECK SURGERY  2011   Cervical Fusion  . RIGHT HEART CATH AND CORONARY ANGIOGRAPHY N/A 07/28/2018   Procedure: RIGHT HEART CATH AND CORONARY ANGIOGRAPHY;  Surgeon: Antonieta Iba, MD;  Location: [facility] INVASIVE CV LAB;  Service:  Cardiovascular;  Laterality: N/A;  . TEE WITHOUT CARDIOVERSION N/A 09/13/2018   Procedure: TRANSESOPHAGEAL ECHOCARDIOGRAM (TEE);  Surgeon: Tonny Bollman, MD;  Location: [facility] OR;  Service: Open Heart Surgery;  Laterality: N/A;  . TRANSCATHETER AORTIC VALVE REPLACEMENT, TRANSFEMORAL  09/13/2018  . TRANSCATHETER AORTIC VALVE REPLACEMENT, TRANSFEMORAL N/A 09/13/2018   Procedure: TRANSCATHETER AORTIC VALVE REPLACEMENT, TRANSFEMORAL;  Surgeon: Tonny Bollman, MD;  Location: [facility] OR;  Service: Open Heart Surgery;  Laterality: N/A;    Allergies  Allergies  Allergen Reactions  . Nickel Itching    History of Present Illness    James Cardenas is a 80 y.o. male with PMH as above. He reports (and has reported in the past)  frustration that he was active in the past as an adult, playing competitive tennis, biking, and routinely exercising. Starting in early 2020, he developed exertional fatigue and chest tightness, as well as ankle and pedal edema. His activity level/ dropped and increased sedentary lifestyle noted. He was not as active as in the past, due to his slow peripher. al neuropathy affecting his balance, as well as sx as above. Subsequent 04/29/2018 echo showed severely calcified aortic valve with mean gradient of 37 mm HG and peak gradient of 62 mm HG, moderate AI, LVEF 55% with moderate LVH. He then underwent R/L cardiac cath 07/28/2018 for pre-TAVR workup that showed no significant obstructive CAD and  normal right heart pressures. Carotid US with mild dz. Chest CT with small pulmonary nodules.  He underwent successful TAVR 09/2018 for AS/AI with improvement in sx / exercise . Repeat echo showed nl EF without AS/AI/perivalvular leak.   At the end of cardiac rehab, he was found to have new atrial flutter with RVR. He started Eliquis and diltiazem. At RTC, he noted dizziness and hypotension with diltiazem. He was advised to discontinue losartan and continue diltiazem. Initially, it was noted he did not  wish to start diltiazem and instead continued losartan. Due to ongoing elevated rates in the 110s at RTC on diltiazem, he was started on digoxin.He then underwent DCCV 01/20/19.  At RTC, he was in NSR and noted significant side effects with both diltiazem and digoxin. Both medications were discontinued. Per EMR, at RTC with primary cardiologist, decision was made to stop [place] after 1 month and as documented by Dr. Rockey Situ due to cost and anxiety surrounding the medication. He was started on ASA.   Last seen by the structural heart team 09/2019 with chronic mild LEE. He reported he was selling his house and moving to an apartment, as well as giving away his dog. He was being active and walking with his other dog.   At RTC with primary cardiologist, Dr. Rockey Situ, he again declined [facility] due to anxiety surrounding ADRs. He also declined diuresis with lasix, recommended at time of visit per EMR.   He called the office 04/2020 with flu like sx (treating with OTC medications) and fluctuating BP, attributed to coricidin, delsym, and alka seltzer plus. He reported elevated BP when sx were at their worse. He had not seen his PCP. He had not been tested for flu or COVID-19. Recommendation was to discontinue OTC medications and reach out to his PCP. COVID-19 test recommended but declined per EMR. He also indicated preference to continue his current OTC medications.   Visit today is for ongoing labile BP. Ongoing since the 7th  Seen via telemedicine/telephone only visit 04/30/2020 for ongoing cold symptoms and elevated BP.  OTC medications were discontinued at that time.  He reported having cold symptoms since 1/7 after seeing his family over the holidays, which included his son, spending cold.  A day later, he started to experience cold symptoms, including sneezing and runny nose that were persistent.  He had not yet tested for COVID-19 or flu.  With worsening of symptoms, he also noted elevated BP.  He reviewed a detailed  log of his blood pressure.  On further questioning, it was revealed that he has also been gaining weight without change in diet.  Suspicion was expressed regarding his possible retention of fluid.  He was agreeable to restart Lasix 20 mg daily plan at that time was for him to diurese until follow-up on 05/03/2020.  Seen 05/03/20 and noted significant improvement with lasix 20mg  daily. In addition to sx improvement, he noted wt loss. SBP improved with lasix and further improved as his anxiety improved. He lost 4.5 lbs on home scale in 2 days.  He was so excited regarding the improvement in his symptoms that he subsequently decided to celebrate with a take out meal and wine, after which time his BP again increased and sx returned. In clinic, BP was significantly elevated with clonidine 0.1mg  given in clinic and recommendation for outpatient daily amlodipine 2.5mg  started to be escalated as tolerated with clonidine 0.1mg  started and to take on a PRN basis for SBP greater than 160.  Over  the weekend, he called the office with ongoing elevated BP and amlodipine increased to 5mg  daily.   Today, 05/17/20, he returns to clinic and is overall doing well. No CP, SOB, DOE, racing HR, or palpitations. No presyncope or syncope. He notes improvement in his LEE, especially in the feet swelling. He continues to report concern for elevated blood pressure, though he realizes that it is heavily connected with his anxiety. SBP in the past has always been 010-932 systolic.  DBP usually low 70s.  He reports low/well-controlled BP for 20+ years and until recently with his anxiety and recent illness.  He reports between 7-10,000 steps per day.  He has not yet started biking but intends to do so. No signs or symptoms of bleeding. He notes that he continues on ASA 81 mg daily with recommendation per Dr. Rockey Situ for as needed Eliquis if tachypalpitations. He is aware that [facility] is indicated for stroke. He does wonder if he will need lasix  daily on an indefinite basis or for only a short while with diet and fluid intake reviewed. Also reviewed were the reassuring echo results.   Home Medications    Current Outpatient Medications on File Prior to Visit  Medication Sig Dispense Refill  . Alpha-Lipoic Acid 600 MG CAPS Take 600 mg by mouth every morning.     Marland Kitchen ALPRAZolam (XANAX) 0.5 MG tablet TAKE 1 TABLET BY MOUTH TWICE A DAY AS NEEDED FOR ANXIETY 60 tablet 1  . amLODipine (NORVASC) 5 MG tablet Take 1 tablet (5 mg total) by mouth daily. 90 tablet 3  . aspirin EC 81 MG tablet Take 1 tablet (81 mg total) by mouth daily. 90 tablet 3  . Azelaic Acid 15 % cream Apply 1 application topically daily. After skin is thoroughly washed and patted dry, gently but thoroughly massage a thin film of azelaic acid cream into the affected area once daily    . Carboxymethylcellul-Glycerin (LUBRICATING EYE DROPS OP) Place 1 drop into both eyes 2 (two) times a day.    . cholecalciferol (VITAMIN D3) 25 MCG (1000 UT) tablet Take 1,000 Units by mouth daily.    . cloNIDine (CATAPRES) 0.1 MG tablet Take 1 tablet (0.1 mg total) by mouth every 8 (eight) hours as needed (for systolic blood pressure (top number) greater than or equal to 160.). 90 tablet 0  . furosemide (LASIX) 20 MG tablet Take 1 tablet (20 mg total) by mouth daily. 30 tablet 2  . losartan (COZAAR) 100 MG tablet TAKE 1 TABLET BY MOUTH EVERY DAY 90 tablet 2  . Multiple Vitamin (MULTIVITAMIN WITH MINERALS) TABS tablet Take 1 tablet by mouth daily in the afternoon. Centrum    . MYRBETRIQ 50 MG TB24 tablet TAKE 1 TABLET BY MOUTH EVERY DAY 30 tablet 5  . neomycin-polymyxin-hydrocortisone (CORTISPORIN) 3.5-10000-1 OTIC suspension Apply 1-2 drops daily after soaking and cover with bandaid 10 mL 0  . OVER THE COUNTER MEDICATION Take 1 tablet by mouth at bedtime as needed (sleep). [employer] PREVENTIVE NUTRITION TRI-SLEEP    . polyethylene glycol (MIRALAX / GLYCOLAX) packet Take 17 g by mouth 2 (two) times daily  as needed (constipation).     . rosuvastatin (CRESTOR) 10 MG tablet TAKE 1 TABLET BY MOUTH EVERY DAY 90 tablet 3  . tretinoin (RETIN-A) 0.1 % cream Apply 1 application topically at bedtime.   0  . triamcinolone cream (KENALOG) 0.1 % APPLY TWICE DAILY TO AFFECTED RASH AS NEEDED     No current facility-administered medications on file  prior to visit.    Review of Systems    He denies chest pain, palpitations, SOB, dyspnea, pnd, orthopnea, early satiety, n, v, dizziness, syncope.Marland Kitchen He reports bilateral LEE has improved. He reports resolution of fatigue, early satiety, DOE/SOB, weight gain. He reports wt loss with the lasix. BP usually improves with anxiety and lasix. All other systems reviewed and are otherwise negative except as noted above.  Physical Exam    VS:  BP (!) 150/80 (BP Location: Left Arm, Patient Position: Sitting, Cuff Size: Normal)   Pulse 78   Ht 6\' 3"  (1.905 m)   Wt 188 lb (85.3 kg)   SpO2 98%   BMI 23.50 kg/m  , BMI Body mass index is 23.5 kg/m. GEN: Well nourished, well developed, in no acute distress. HEENT: normal. Neck: Supple, JVP 8-9cm. No carotid bruits, or masses. Cardiac: RRR, 1/6 systolic murmur best appreciated RUSB, no rubs or gallops. No clubbing, cyanosis, moderate bilateral edema.  Radials/DP/PT 2+ and equal bilaterally.  Respiratory:  Respirations regular and unlabored, faint bibasilar crackles. GI: Soft, nontender, nondistended, BS + x 4. MS: no deformity or atrophy. Skin: warm and dry, no rash. Neuro:  Strength and sensation are intact. Psych: Normal affect.  Accessory Clinical Findings    ECG personally reviewed by me today -sinus rhythm with first-degree AV block, PRi 282ms, 78bpm, right bundle branch block not new, QRS 177ms, QTc 434ms- no acute changes.  VITALS Reviewed today   Temp Readings from Last 3 Encounters:  03/25/20 97.8 F (36.6 C) (Temporal)  02/14/20 (!) 97 F (36.1 C) (Tympanic)  09/22/19 (!) 97.3 F (36.3 C) (Temporal)    BP Readings from Last 3 Encounters:  05/17/20 (!) 150/80  05/03/20 (!) 198/108  04/30/20 (!) 172/99   Pulse Readings from Last 3 Encounters:  05/17/20 78  05/03/20 83  03/25/20 65    Wt Readings from Last 3 Encounters:  05/17/20 188 lb (85.3 kg)  05/03/20 200 lb (90.7 kg)  04/30/20 196 lb (88.9 kg)     LABS  reviewed today   Lab Results  Component Value Date   WBC 4.8 05/03/2020   HGB 14.0 05/03/2020   HCT 41.5 05/03/2020   MCV 97.2 05/03/2020   PLT 187 05/03/2020   Lab Results  Component Value Date   CREATININE 0.86 05/03/2020   BUN 23 05/03/2020   NA 140 05/03/2020   K 4.3 05/03/2020   CL 105 05/03/2020   CO2 28 05/03/2020   Lab Results  Component Value Date   ALT 21 02/14/2020   AST 28 02/14/2020   GGT 26 07/25/2018   ALKPHOS 69 02/14/2020   BILITOT 1.7 (H) 02/14/2020   Lab Results  Component Value Date   CHOL 139 03/18/2020   HDL 66 03/18/2020   LDLCALC 58 03/18/2020   TRIG 66 03/18/2020   CHOLHDL 2.1 03/18/2020    Lab Results  Component Value Date   HGBA1C 5.5 03/18/2020   Lab Results  Component Value Date   TSH 2.479 05/03/2020     STUDIES/PROCEDURES reviewed today   Echo 05/16/2020 1. Left ventricular ejection fraction, by estimation, is 60 to 65%. The  left ventricle has normal function. The left ventricle has no regional  wall motion abnormalities. There is moderate left ventricular hypertrophy.  Left ventricular diastolic  parameters are consistent with Grade II diastolic dysfunction  (pseudonormalization).  2. Right ventricular systolic function is normal. The right ventricular  size is normal. Moderately increased right ventricular wall thickness.  3.  Left atrial size was mildly dilated.  4. 26 Edwards Sapien bioprosthetic aortic valve (TAVR) valve is present   in the aortic position. Procedure Date: 09/13/18 The aortic valve was  not well visualized. Aortic valve regurgitation is not visualized. Aortic  valve mean  gradient measures 15.0 mmHg. Aortic valve Vmax measures 2.52  m/s. Unchanged from prior study  09/2019. Estimated AVA of 0.74 cm sq does not appear to correlate to  gradients measured.   Echo  09/2019 1. 26 mm S3 in aortic position. V max 2.7 m/s, MG 15 mmHG, EOA 1.26 cm2,  DI 0.33. Normal functioning TAVR without paravalvular leak or  regurgitation. Gradients are unchanged from prior. The aortic valve has  been repaired/replaced. Aortic valve  regurgitation is not visualized.  2. Left ventricular ejection fraction, by estimation, is 60 to 65%. The  left ventricle has normal function. The left ventricle has no regional  wall motion abnormalities. There is moderate concentric left ventricular  hypertrophy. Left ventricular  diastolic parameters are consistent with Grade II diastolic dysfunction  (pseudonormalization). Elevated left atrial pressure.  3. Right ventricular systolic function is normal. The right ventricular  size is normal. There is normal pulmonary artery systolic pressure. The  estimated right ventricular systolic pressure is 09.3 mmHg.  4. Left atrial size was mildly dilated.  5. The mitral valve is grossly normal. Trivial mitral valve  regurgitation. No evidence of mitral stenosis.  6. The inferior vena cava is normal in size with greater than 50%  respiratory variability, suggesting right atrial pressure of 3 mmHg.  Comparison(s): No significant change from prior study.   Carotid US 09/2019 Summary:  Right Carotid: Velocities in the right ICA are consistent with a 1-39%  stenosis.  Left Carotid: Velocities in the left ICA are consistent with a 40-59%  stenosis.  Vertebrals: Bilateral vertebral arteries demonstrate antegrade flow.  Subclavians: Normal flow hemodynamics were seen in bilateral subclavian        arteries.  *See table(s) above for measurements and observations.  Suggest follow up study in 12 months.   [facility] 07/2018 Final Conclusions:   No significant coronary artery disease Pulmonary HTN Unable to cross aortic valve for pressures or LV function Grossly normal right heart pressures Recommendations:  Given the findings above, we will refer to TAVR clinic in [facility] No complications  ASSESSMENT & PLAN:    Acute on chronic HFpEF  Pulmonary hypertension -- S/sx of overload significantly improved since start of lasix.  Updated echo above with EF 60-65%, NRWMA, moderate LVH, G2DD, moderate increase in RVH, mild LAE.  Reviewed salt and fluid restrictions. Still volume up on exam, though significantly improved from previous visits with significant improvement in home wt and BP documented by pt log. Continue lasix 20mg  daily. Obtain CBC, BMET, BNP.    Hypertension, poorly controlled  -- Goal BP 130/80. Reviewed guidelines for total salt of 2 g/day and total fluid of 2 L/day. Cautioned against eating salted nuts. Suspect elevated BP is likely 2/2 volume overload and at times worsened by anxiety. Continue lasix 20mg  daily, amlodipine 5mg  daily, and clonidine 0.1mg  on PRN basis with SBP greater than 160.   Atrial flutter/fibrillation with RVR s/p DCCV 01/2019 -- No report of tachypalpitations. DCCV 01/20/2019.  Reports rates well controlled. He noted adverse effects 2/2 diltiazem, as well as digoxin.  He has since been taken off of these medications.  In the past, beta-blocker has been deferred due to bradycardic rates at home.  Per patient preference, he  is not on anticoagulation and instead remains on ASA 81 mg daily. CHA2DS2VASc score of at least5 (HFpEF, agex2, vascular) with recommendation for daily OAC with Eliquis 5 mg twice daily as he does not meet reduced dosing criteria and to prevent risk of thromboembolic event. Continue to recommend anticoagulation instead of ASA 81 mg daily, as discussed.   AS s/p TAVR --  S/p TAVR 09/2018. Most recent echo as above with findings of aortic valve noted as unchanged from prior study.   SBE prophylaxis recommended with patient report that he takes an antibiotic before his dentist.  As above, continue to recommend Eliquis with patient preference to instead take ASA.  Recommend BP and heart rate control.   HLD, LDL goal below 70 --Continue statin.  Continue to monitor with periodic lipid and liver function per PCP.   Arvil Chaco, PA-C 05/17/2020

## 2020-05-20 ENCOUNTER — Telehealth: Payer: Self-pay | Admitting: Cardiovascular Disease

## 2020-05-20 NOTE — Telephone Encounter (Signed)
Patient calling in wanting to discuss his fluid pills and what they truly do. Patient read some information on his fluid pill and and wants to discuss rehydration and dehydration

## 2020-05-21 NOTE — Telephone Encounter (Signed)
Spoke to pt in detail, reviewed mechanism of action of Furosemide and purpose regarding pulmonary HTN. After reading material on his own, pt with questions regarding how we measure if he is dehydrated while taking Lasix. Notified pt that we monitor Bmet looking at kidney function, and that he may monitor urine output and color on his part and to let us know if color changes to darker and/or decr in output.   Also reviewed lab results with pt from 2/4 - notified BMP consistent with volume overload as noted on exam with Marrianne Mood, PA in office. Renal function improving with diuresis. Electrolytes and blood counts stable. Advised pt to continue medications and treatment plan as discussed in clinic.   Pt appreciative of explanation and verbalized understanding. Pt has no further questions at this time.

## 2020-05-23 ENCOUNTER — Other Ambulatory Visit: Payer: Self-pay

## 2020-05-23 ENCOUNTER — Encounter: Payer: Self-pay | Admitting: Urology

## 2020-05-23 ENCOUNTER — Telehealth: Payer: Self-pay | Admitting: Cardiovascular Disease

## 2020-05-23 ENCOUNTER — Ambulatory Visit (INDEPENDENT_AMBULATORY_CARE_PROVIDER_SITE_OTHER): Payer: Medicare Other | Admitting: Urology

## 2020-05-23 VITALS — BP 143/83 | HR 73 | Ht 72.0 in | Wt 188.0 lb

## 2020-05-23 DIAGNOSIS — N401 Enlarged prostate with lower urinary tract symptoms: Secondary | ICD-10-CM

## 2020-05-23 DIAGNOSIS — R350 Frequency of micturition: Secondary | ICD-10-CM

## 2020-05-23 LAB — BLADDER SCAN AMB NON-IMAGING: Scan Result: 25

## 2020-05-23 NOTE — Telephone Encounter (Signed)
Patient wants to discuss POC when bp drops below 893 systolic.  Patient also needs clarity on water pill.  No further information provided.    Please call.

## 2020-05-23 NOTE — Progress Notes (Signed)
05/23/2020 11:16 AM   Mickle Asper Learn 1940-06-16 [id number]  Referring provider: Olin Hauser, DO [address] [place],   [id number]  Chief Complaint  Patient presents with  . Benign Prostatic Hypertrophy    Urologic history: 1.  BPH lower urinary tract symptoms -Initially seen by Dr. Pilar Jarvis in 2017 -Primarily storage related voiding symptoms -Was on tamsulosin without improvement  -Started Myrbetriq 25 mg 2017 with marked improvement -Tamsulosin discontinued 2018 -Myrbetriq increased 50 mg 2019  2.  Erectile dysfunction -Generic sildenafil  HPI: 80 y.o. male presents for annual follow-up.   Recently started on Lasix for fluid retention and notes a better urinary stream and less urgency  He remains on Myrbetriq however is pain over $200 per month  Denies dysuria, gross hematuria  Denies flank, abdominal or pelvic pain     PMH: Past Medical History:  Diagnosis Date  . Anxiety   . Arthritis of knee   . Atypical atrial flutter ([place])    a. Dx 12/15/2018. CHA2DS2VASc 3-->eliquis.  . Carotid artery disease ([place])   . 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 Hearing loss    wears hearing aids  . Hypertension   . Neuropathy   . Nonobstructive Coronary artery disease    a. 07/2018 Cath: LMN nl, LAD/LCX/RCA no significant dzs. EF 55-65%.  . S/P TAVR (transcatheter aortic valve replacement)    a. 09/2018 TAVR: 26 mm Edwards Sapien THV via the TF approach; b. 10/2018 Echo: EF 55-60%. Nl RV fxn. RVSP 20.2mmHg. Mild BAE. No AI or perivalvular leak.  . Severe aortic stenosis     Surgical History: Past Surgical History:  Procedure Laterality Date  . CARDIOVERSION N/A 01/20/2019   Procedure: CARDIOVERSION;  Surgeon: Minna Merritts, MD;  Location: [facility];  Service: Cardiovascular;  Laterality: N/A;  . COLONOSCOPY    . EYE SURGERY Bilateral    cataract  . HERNIA REPAIR Left    Inguinal  . NECK SURGERY  2011   Cervical Fusion  . RIGHT HEART CATH AND  CORONARY ANGIOGRAPHY N/A 07/28/2018   Procedure: RIGHT HEART CATH AND CORONARY ANGIOGRAPHY;  Surgeon: Minna Merritts, MD;  Location: [facility];  Service: Cardiovascular;  Laterality: N/A;  . TEE WITHOUT CARDIOVERSION N/A 09/13/2018   Procedure: TRANSESOPHAGEAL ECHOCARDIOGRAM (TEE);  Surgeon: Sherren Mocha, MD;  Location: [facility];  Service: Open Heart Surgery;  Laterality: N/A;  . TRANSCATHETER AORTIC VALVE REPLACEMENT, TRANSFEMORAL  09/13/2018  . TRANSCATHETER AORTIC VALVE REPLACEMENT, TRANSFEMORAL N/A 09/13/2018   Procedure: TRANSCATHETER AORTIC VALVE REPLACEMENT, TRANSFEMORAL;  Surgeon: Sherren Mocha, MD;  Location: [facility];  Service: Open Heart Surgery;  Laterality: N/A;    Home Medications:  Allergies as of 05/23/2020      Reactions   Nickel Itching      Medication List       Accurate as of May 23, 2020 11:16 AM. If you have any questions, ask your nurse or doctor.        Alpha-Lipoic Acid 600 MG Caps Take 600 mg by mouth every morning.   ALPRAZolam 0.5 MG tablet Commonly known as: XANAX TAKE 1 TABLET BY MOUTH TWICE A DAY AS NEEDED FOR ANXIETY   amLODipine 5 MG tablet Commonly known as: NORVASC Take 1 tablet (5 mg total) by mouth daily.   aspirin EC 81 MG tablet Take 1 tablet (81 mg total) by mouth daily.   Azelaic Acid 15 % cream Apply 1 application topically daily. After  skin is thoroughly washed and patted dry, gently but thoroughly massage a thin film of azelaic acid cream into the affected area once daily   cholecalciferol 25 MCG (1000 UNIT) tablet Commonly known as: VITAMIN D3 Take 1,000 Units by mouth daily.   cloNIDine 0.1 MG tablet Commonly known as: Catapres Take 1 tablet (0.1 mg total) by mouth every 8 (eight) hours as needed (for systolic blood pressure (top number) greater than or equal to 160.).   furosemide 20 MG tablet Commonly known as: LASIX Take 1 tablet (20 mg total) by mouth daily.   losartan 100 MG tablet Commonly known as:  COZAAR TAKE 1 TABLET BY MOUTH EVERY DAY   LUBRICATING EYE DROPS OP Place 1 drop into both eyes 2 (two) times a day.   multivitamin with minerals Tabs tablet Take 1 tablet by mouth daily in the afternoon. Centrum   Myrbetriq 50 MG Tb24 tablet Generic drug: mirabegron ER TAKE 1 TABLET BY MOUTH EVERY DAY   neomycin-polymyxin-hydrocortisone 3.5-10000-1 OTIC suspension Commonly known as: CORTISPORIN Apply 1-2 drops daily after soaking and cover with bandaid   OVER THE COUNTER MEDICATION Take 1 tablet by mouth at bedtime as needed (sleep). GNC PREVENTIVE NUTRITION TRI-SLEEP   polyethylene glycol 17 g packet Commonly known as: MIRALAX / GLYCOLAX Take 17 g by mouth 2 (two) times daily as needed (constipation).   rosuvastatin 10 MG tablet Commonly known as: CRESTOR TAKE 1 TABLET BY MOUTH EVERY DAY   tretinoin 0.1 % cream Commonly known as: RETIN-A Apply 1 application topically at bedtime.   triamcinolone 0.1 % Commonly known as: KENALOG APPLY TWICE DAILY TO AFFECTED RASH AS NEEDED       Allergies:  Allergies  Allergen Reactions  . Nickel Itching    Family History: Family History  Problem Relation Age of Onset  . Thyroid disease Mother   . Lung cancer Father   . Pancreatic cancer Brother   . Alzheimer's disease Sister   . Prostate cancer Neg Hx   . Bladder Cancer Neg Hx   . Kidney cancer Neg Hx     Social History:  reports that he has never smoked. He has never used smokeless tobacco. He reports current alcohol use of about 7.0 standard drinks of alcohol per week. He reports that he does not use drugs.   Physical Exam: There were no vitals taken for this visit.  Constitutional:  Alert and oriented, No acute distress. HEENT: [place] AT, moist mucus membranes.  Trachea midline, no masses. Cardiovascular: No clubbing, cyanosis, or edema. Respiratory: Normal respiratory effort, no increased work of breathing. Neurologic: Grossly intact, no focal deficits, moving all 4  extremities. Psychiatric: Normal mood and affect.   Assessment & Plan:    1. Benign prostatic hyperplasia with urinary frequency  Most bothersome symptoms are storage related  Bladder scan PVR 25 mL  Myrbetriq co-pay is high and I recommended he try to discontinue the medication and see if he notes any significant change in his symptoms.  If his symptoms do worsen can try an alternate medication to lessen his out-of-pocket cost.  Continue annual follow-up   Abbie Sons, [facility] [address] [place], [place] [address] [phone]

## 2020-05-24 NOTE — Telephone Encounter (Signed)
Patient calling back in to check on status of call  Please advise before leaving for the weekend

## 2020-05-24 NOTE — Telephone Encounter (Signed)
Attempted phone call to number x 4.  Recording keeps stating call cannot be completed as dialed.

## 2020-05-25 ENCOUNTER — Telehealth: Payer: Self-pay | Admitting: Medical

## 2020-05-25 NOTE — Telephone Encounter (Signed)
   Patient called the after hours line to report concerns with low blood pressures.He has taken over 30+ readings over the past 4 days with 2 different machines with steadily declining BP's with SBP's typically in the 110s with only 2 isolated readings in the 120s, though today notes SBP's in the 100s. He has felt increasing fatigue with lower blood pressures. No complaints of dizziness or lightheadedness. He is concerned that now that he is nearing his dry weight, he no longer needs the higher dose of amlodipine. He would like to trial taking amlodipine 2.5mg  daily. We discussed threshold for increasing amlodipine to 5mg  daily. I have no doubt he will keep a close eye on his blood pressures and notify the office with concerns. He was in agreement with the plan and appreciative of the call.  Abigail Butts, PA-C 05/25/20; 2:06 PM

## 2020-05-27 NOTE — Telephone Encounter (Signed)
Patient is calling back in regarding his blood pressure, stating it has been low. Patient wanting to discuss his medication for blood pressure, states he is still taking 5 mg amlodipine.   Confirmed phone number as prior note stated she was unable to get in contact

## 2020-05-27 NOTE — Telephone Encounter (Signed)
I spoke with the patient. He states for the past the 4-5 days his SBP has been running 100-118 and he was feeling lethargic. He states he called the on-call provider on Saturday and was given the ok to decrease amlodipine to 2.5 mg once daily.   He had been on this dose previously and his SBP was in the 130's, therefore the amlodipine was increased to 5 mg once daily.   The patient wanted to let us know that he is pending some minor hand surgery tomorrow and is somewhat anxious about this.  Therefore, he was worried his BP would go up, so he decided to stay on amlodipine 5 mg once daily until after his surgery. He did take a Xanax today and his SBP has been 110-118. He will continue to monitor this post surgery and if his readings are still low, he will decrease the amlodipine to 2.5 mg once daily and call the office to let us know.   I have advised the patient this is fine and we will not change his amlodipine dose in his chart unless he calls back and confirms he has definitely decreased this.   The patient was appreciative for the call back.

## 2020-05-28 ENCOUNTER — Telehealth: Payer: Self-pay | Admitting: *Deleted

## 2020-05-28 ENCOUNTER — Telehealth: Payer: Self-pay | Admitting: Cardiovascular Disease

## 2020-05-28 DIAGNOSIS — M720 Palmar fascial fibromatosis [Dupuytren]: Secondary | ICD-10-CM | POA: Diagnosis not present

## 2020-05-28 NOTE — Telephone Encounter (Signed)
"  I'm calling to see if my orthotics are ready yet that I left for a rebuild about two months or so ago for repair.  I haven't heard anything about them being ready to be picked up.  Anyways, just let me know if they are ready or try to find out when they will be ready."

## 2020-05-28 NOTE — Telephone Encounter (Signed)
Patient calling  Patient has to sometimes take amoxicillin 500 MG  Has some questions regarding this Please call to discuss

## 2020-05-29 NOTE — Telephone Encounter (Signed)
Was able to reach out to pt and return his call. Educated pt on why ABX is necessary before dental procedures and surgeries where there is a break in the skin to reduce the bacteria from entering the bloodstream that can affect the heart, the ABX are usually prescribe as a preventative measure for those with cardia disease. Pt verbalized understanding. Pt also inquired more info on water pills and fluid retention, also reiterated what him and RN Jinny Blossom spoke of a few days ago with added information on water retention, long term therapy, affects of fluid overloads, and how the heart, lungs, and kidneys all have a systematic approach. Pt is very grateful of education and repeated return phone calls. At this time, nothing further.

## 2020-05-30 NOTE — Telephone Encounter (Signed)
[facility], the orthotics are not here.  I checked before I sent the message.  Liliane Channel do you have them?

## 2020-05-30 NOTE — Telephone Encounter (Signed)
"  ok I think I fiqured out James Cardenas. He picked up that pair of refurbished and dropped off another pair to be refurbished. They just came in on Monday so If Dr Amalia Hailey comes in today after surgery I will send it with him if not I will try to remember to give to Dr Prudence Davidson tomorrow for him to bring Monday."  Okay great, thank you.

## 2020-05-31 ENCOUNTER — Telehealth: Payer: Self-pay | Admitting: Cardiovascular Disease

## 2020-05-31 DIAGNOSIS — M720 Palmar fascial fibromatosis [Dupuytren]: Secondary | ICD-10-CM | POA: Diagnosis not present

## 2020-05-31 NOTE — Telephone Encounter (Signed)
Patient calling  Wants to know if he can change from amlodipine from 5 MG to 2.5 MG  Also has questions in regards to meclizine 25mg  that he takes for dizziness Please call to discuss

## 2020-06-03 NOTE — Telephone Encounter (Signed)
Was able to reach back out to pt, Mr. Fotheringham had a in dept conversation with this RN regarding weight loss of 16 lbs or more wince taking his diuretics and some diet modifications. Reports since then managing BP daily,  BP 50-60 times with two different machines and trying different methods of taking BP with BP ranges of 25-427 systolic, occasionally BP will increase to over 110, reports HR remains WNL 60-70 bmp. Pt reports in the evenings his BP typically runs lower 06-237S systolic. Mr. Qin also reports low energy, lethargic at times, and light dizzy spells, especially after walking his dogs. Pt wants to know if he can "pass out when I sleep" form lower BP readings in the evening. Pt concern that he lives alone with his dogs and if he "pass out when he sleeps" what would happen to him?. Mr. Preece has done "research" and would like some of his BP medications modified based on recent weight loss and lower readings. Based on recent phone conversations with other medications questions and adjustment for the past 2 weeks, Blanche East PA-C recommended pt needs a visit to discuss his current medications and answer any questions he may have based on reactions and how the medication interact with other med and current life styles. Would also recommend a visit d/t symptoms of his low energy, lethargic, and dizzy spells. Pt agreeable with speaking to a provider, would prefer virtual d/t having dogs at home. Virtual visit schedule for tomorrow 2/22 at 10:55am.

## 2020-06-04 ENCOUNTER — Telehealth: Payer: Medicare Other | Admitting: Nurse Practitioner

## 2020-06-05 ENCOUNTER — Telehealth: Payer: Self-pay | Admitting: *Deleted

## 2020-06-05 NOTE — Telephone Encounter (Signed)
  Patient Consent for Virtual Visit         James Cardenas has provided verbal consent on 06/05/2020 for a virtual visit (video or telephone).   CONSENT FOR VIRTUAL VISIT FOR:  James Cardenas  By participating in this virtual visit I agree to the following:  I hereby voluntarily request, consent and authorize [facility] and its employed or contracted physicians, physician assistants, nurse practitioners or other licensed health care professionals (the Practitioner), to provide me with telemedicine health care services (the "Services") as deemed necessary by the treating Practitioner. I acknowledge and consent to receive the Services by the Practitioner via telemedicine. I understand that the telemedicine visit will involve communicating with the Practitioner through live audiovisual communication technology and the disclosure of certain medical information by electronic transmission. I acknowledge that I have been given the opportunity to request an in-person assessment or other available alternative prior to the telemedicine visit and am voluntarily participating in the telemedicine visit.  I understand that I have the right to withhold or withdraw my consent to the use of telemedicine in the course of my care at any time, without affecting my right to future care or treatment, and that the Practitioner or I may terminate the telemedicine visit at any time. I understand that I have the right to inspect all information obtained and/or recorded in the course of the telemedicine visit and may receive copies of available information for a reasonable fee.  I understand that some of the potential risks of receiving the Services via telemedicine include:  Marland Kitchen Delay or interruption in medical evaluation due to technological equipment failure or disruption; . Information transmitted may not be sufficient (e.g. poor resolution of images) to allow for appropriate medical decision making by the Practitioner;  and/or  . In rare instances, security protocols could fail, causing a breach of personal health information.  Furthermore, I acknowledge that it is my responsibility to provide information about my medical history, conditions and care that is complete and accurate to the best of my ability. I acknowledge that Practitioner's advice, recommendations, and/or decision may be based on factors not within their control, such as incomplete or inaccurate data provided by me or distortions of diagnostic images or specimens that may result from electronic transmissions. I understand that the practice of medicine is not an exact science and that Practitioner makes no warranties or guarantees regarding treatment outcomes. I acknowledge that a copy of this consent can be made available to me via my patient portal ([facility]), or I can request a printed copy by calling the office of [facility].    I understand that my insurance will be billed for this visit.   I have read or had this consent read to me. . I understand the contents of this consent, which adequately explains the benefits and risks of the Services being provided via telemedicine.  . I have been provided ample opportunity to ask questions regarding this consent and the Services and have had my questions answered to my satisfaction. . I give my informed consent for the services to be provided through the use of telemedicine in my medical care

## 2020-06-06 ENCOUNTER — Other Ambulatory Visit: Payer: Self-pay

## 2020-06-06 ENCOUNTER — Encounter: Payer: Self-pay | Admitting: Physician Assistant

## 2020-06-06 ENCOUNTER — Telehealth (INDEPENDENT_AMBULATORY_CARE_PROVIDER_SITE_OTHER): Payer: Medicare Other | Admitting: Physician Assistant

## 2020-06-06 VITALS — BP 116/68 | HR 64 | Ht 72.0 in | Wt 187.0 lb

## 2020-06-06 DIAGNOSIS — Z952 Presence of prosthetic heart valve: Secondary | ICD-10-CM | POA: Diagnosis not present

## 2020-06-06 DIAGNOSIS — I5033 Acute on chronic diastolic (congestive) heart failure: Secondary | ICD-10-CM | POA: Diagnosis not present

## 2020-06-06 DIAGNOSIS — F418 Other specified anxiety disorders: Secondary | ICD-10-CM | POA: Diagnosis not present

## 2020-06-06 DIAGNOSIS — I350 Nonrheumatic aortic (valve) stenosis: Secondary | ICD-10-CM

## 2020-06-06 DIAGNOSIS — I779 Disorder of arteries and arterioles, unspecified: Secondary | ICD-10-CM

## 2020-06-06 DIAGNOSIS — I10 Essential (primary) hypertension: Secondary | ICD-10-CM

## 2020-06-06 DIAGNOSIS — Z8679 Personal history of other diseases of the circulatory system: Secondary | ICD-10-CM | POA: Diagnosis not present

## 2020-06-06 DIAGNOSIS — M720 Palmar fascial fibromatosis [Dupuytren]: Secondary | ICD-10-CM | POA: Diagnosis not present

## 2020-06-06 DIAGNOSIS — I5032 Chronic diastolic (congestive) heart failure: Secondary | ICD-10-CM

## 2020-06-06 DIAGNOSIS — E785 Hyperlipidemia, unspecified: Secondary | ICD-10-CM | POA: Diagnosis not present

## 2020-06-06 NOTE — Patient Instructions (Addendum)
Medication Instructions:  Your physician recommends that you continue on your current medications as directed. Please refer to the Current Medication list given to you today.  *If you need a refill on your cardiac medications before your next appointment, please call your pharmacy*   Lab Work: None ordered  Testing/Procedures: None ordered   Follow-Up: At [facility], you and your health needs are our priority.  As part of our continuing mission to provide you with exceptional heart care, we have created designated Provider Care Teams.  These Care Teams include your primary Cardiologist (physician) and Advanced Practice Providers (APPs -  Physician Assistants and Nurse Practitioners) who all work together to provide you with the care you need, when you need it.  We recommend signing up for the patient portal called "MyChart".  Sign up information is provided on this After Visit Summary.  MyChart is used to connect with patients for Virtual Visits (Telemedicine).  Patients are able to view lab/test results, encounter notes, upcoming appointments, etc.  Non-urgent messages can be sent to your provider as well.   To learn more about what you can do with MyChart, go to [email or web address].    Your next appointment:    Follow up as scheduled    Other Instructions  Check your device's accuracy against an office model once a year if possible. You can do so by bringing your cuff into the office and notifying the person that rooms you that you would like to check your cuff against our office readings.  We recommend upper arm BP cuffs over that of the wrist.  Measure your BP maximum twice daily. Make sure you take it at the same time every day and in the same arm when comparing BP from day to day.  Each time you measure, take an additional reading if abnormal to ensure accurate. Do not measure BP right after you wake up or exercise. Do not take your BP immediately after a stressful  situation. Avoid caffeine, tobacco, and alcohol for 30 minutes before taking a measurement. Sit quietly for five minutes in a comfortable position with your legs and ankles uncrossed and back supported. Place feet flat on the ground. Have your arm supported and at the level of your heart. Again, always use the same arm when taking your blood pressure. Place the cuff over bare skin rather than clothing. If needed, take a repeat BP reading by waiting 5 minutes after the first reading. Blood pressure varies often throughout the day with higher readings in the morning. BP may also be lower at home than in the office. It is helpful to document the time of each BP reading, as well as any activity or medications taken around the reading. In addition, it is helpful to include HR. Please include any symptoms associated with any BP readings. Please bring your BP log into the office.   Goal BP is 130/80 or lower.   If your blood pressure is consistently elevated with top number above 160 and bottom above 120, this can damage the body. If you have severe increase in your blood pressure or concerning symptoms of severe chest pain, headache with confusion and blurred vision, severe abdominal or back pain, SOB, seizures, or LOC, go to the ED.   -----------------  1. Follow a low-salt diet - you are allowed no more than 2,000mg  of sodium per day. Watch your fluid intake. In general, you should not be taking in more than 2 liters of fluid per day (no  more than 8 glasses per day). This includes sources of water in foods like soup, coffee, tea, milk, etc. 2. Weigh yourself on the same scale at same time of day and keep a log. 3. Call your doctor: (Anytime you feel any of the following symptoms)  - 3lb weight gain overnight or 5lb within a few days - Shortness of breath, with or without a dry hacking cough  - Swelling in the hands, feet or stomach  - If you have to sleep on extra pillows at night in order to breathe

## 2020-06-06 NOTE — Progress Notes (Signed)
Virtual Visit via Telephone Note   This visit type was conducted due to national recommendations for restrictions regarding the COVID-19 Pandemic (e.g. social distancing) in an effort to limit this patient's exposure and mitigate transmission in our community.  Due to his co-morbid illnesses, this patient is at least at moderate risk for complications without adequate follow up.  This format is felt to be most appropriate for this patient at this time.  The patient did not have access to video technology/had technical difficulties with video requiring transitioning to audio format only (telephone).  All issues noted in this document were discussed and addressed.  No physical exam could be performed with this format.  Please refer to the patient's chart for his  consent to telehealth for [facility].   Date:  06/07/2020   ID:  James Cardenas, DOB [birth year], MRN [id number]  Patient Location: Home Provider Location: Office/Clinic  PCP:  Olin Hauser, DO  Cardiologist:  Ida Rogue, MD  Electrophysiologist:  None   Evaluation Performed:  Follow-Up Visit  Chief Complaint:  80 y.o. male with history of nonobstructive CAD,TAVR6/2020 for severe AS and moderate AI, AT / atypical atrial flutter with DCCV 01/2019, mild carotid artery dz,HTN,overactive bladder,MGUS, pulmonary HTN, pulmonary nodules,GERD,neuropathy and mild gait imbalance,and seen today for BP questions.   Chief Complaint  Patient presents with   Follow-up     History of Present Illness:    James Cardenas is a 80 y.o. male with PMH as above. He has history of nonobstructive CAD,TAVR6/2020 for severe AS and moderate AI, AT / atypical atrial flutter with DCCV 01/2019, mild carotid artery dz,HTN,overactive bladder,MGUS, pulmonary HTN, pulmonary nodules,GERD,neuropathy and mild gait imbalance.  He reports (and has reported in the past) frustration that he was active in the past as an adult,  playing competitive tennis, biking, and routinely exercising. Starting in early 2020, he developed exertional fatigue and chest tightness, as well as ankle and pedal edema.His activity level/ dropped and increased sedentary lifestyle noted.He was not as active as in the past, due to his slow peripher.al neuropathy affecting his balance, as well as sx as above.Subsequent1/17/2020 echo showed severely calcified aortic valve with mean gradient of 37 mm HG and peak gradient of 62 mm HG, moderate AI, LVEF 55% with moderate LVH. Hethenunderwent R/L cardiac cath 4/16/2020for pre-TAVR workupthat showed no significant obstructiveCAD and normal right heart pressures. Carotid US with mild dz. Chest CT with small pulmonary nodules.  He underwent successful TAVR 09/2018 for AS/AI with improvement in sx / exercise . Repeat echo showed nl EF without AS/AI/perivalvular leak.   At the end of cardiac rehab, he was found to have new atrial flutter with RVR. He started Eliquis and diltiazem. At RTC, he noted dizziness and hypotension with diltiazem. He was advised to discontinue losartan and continue diltiazem. Initially, it was noted he did not wish to start diltiazem and instead continued losartan. Due to ongoing elevated rates in the 110s at RTC on diltiazem, he was started on digoxin.He then underwent DCCV 01/20/19. At RTC, he was in NSR and noted significant side effects with both diltiazem and digoxin. Both medications were discontinued. Per EMR, at RTC with primary cardiologist, decision was made to stop [place] after 1 month and as documented by Dr. Rockey Situ due to cost and anxiety surrounding the medication. He was started on ASA.   Last seen by the structural heart team 09/2019 with chronic mild LEE. He reported he was selling his house and moving  to an apartment, as well as giving away his dog. He was being active and walking with his other dog.   At RTC with primary cardiologist, Dr. Rockey Situ, he again declined  [facility] due to anxiety surrounding ADRs. He also declined diuresis with lasix, recommended at time of visit per EMR.   He called the office 04/2020 with flu like sx (treating with OTC medications) and fluctuating BP, attributed to coricidin, delsym, and alka seltzer plus. He reported elevated BP when sx were at their worse. He had not seen his PCP. He had not been tested for flu or COVID-19. Recommendation was to discontinue OTC medications and reach out to his PCP. COVID-19 test recommended but declined per EMR. He also indicated preference to continue his current OTC medications.   Visit today is for ongoing labile BP.Ongoing since the 7th  Seen via telemedicine/telephone only visit 04/30/2020 for ongoing cold symptoms and elevated BP.  OTC medications were discontinued at that time.  He reported having cold symptoms since 1/7 after seeing his family over the holidays, which included his son, spending cold.  A day later, he started to experience cold symptoms, including sneezing and runny nose that were persistent.  He had not yet tested for COVID-19 or flu.  With worsening of symptoms, he also noted elevated BP.  He reviewed a detailed log of his blood pressure.  On further questioning, it was revealed that he has also been gaining weight without change in diet.  Suspicion was expressed regarding his possible retention of fluid.  He was agreeable to restart Lasix 20 mg daily plan at that time was for him to diurese until follow-up on 05/03/2020.  Seen 05/03/20 and noted significant improvement with lasix 20mg  daily. In addition to sx improvement, he noted wt loss. SBP improved with lasix and further improved as his anxiety improved. He lost 4.5 lbs on home scale in 2 days.  He was so excited regarding the improvement in his symptoms that he subsequently decided to celebrate with a take out meal and wine, after which time his BP again increased and sx returned. In clinic, BP was significantly elevated with  clonidine 0.1mg  given in clinic and recommendation for outpatient daily amlodipine 2.5mg  started to be escalated as tolerated with clonidine 0.1mg  started and to take on a PRN basis for SBP greater than 160.  Over the weekend, he called the office with ongoing elevated BP and amlodipine increased to 5mg  daily.   He was last seen with echo results reviewed as below  with LVEF 60-65%, NRWMA, moderate LVH, G2DD, moderate RVH, mild LAE.  BP was 143/83. Discussed anxiety in elevated BP, fluid intake, sodium intake. Amlodipine 5mg  QD, Lasix 20mg  QD, Clonidine 0.2mg  PRN for SBP >160.    He contacted the office 05/23/20 noting SBP 100-118 associated with lethargy. He was recommended by on-call provider to reduce Amlodipine to 2.5mg  QD but was hesitant to do so until after his minor hand surgery. Noted to be checking BP frequently with 2 different machines. Denied dizziness, lightheadedness. He contacted the office again 05/31/20 with questions regarding his medications and was recommended for office visit.   Today, 06/06/2020, he is seen in clinic via telemedicine.  He denies any recent symptoms of chest pain, racing heart rate, palpitations, presyncope, syncope.  No signs or symptoms of volume overload. He reports wt loss with diuresis. No signs or symptoms of bleeding.  He reports improved BP with 2 new blood pressure cuffs.  He has recently called  the blood pressure company and has received very detailed instructions regarding blood pressure variations throughout the day and factors that can influence blood pressure measurements.  He shares these with me today.  He has several questions regarding how to manage his blood pressure in terms of his medications, which were also answered today.  Detailed after visit summary was provided with further instruction.  BP recordings as below 136/85 104-114/60-70 Lethargy presyncope 124/80, 120/78 113/ 60s 50-70 123/73 115/71 117/65 116/69 SBP 110-117  The  patient does not have symptoms concerning for COVID-19 infection (fever, chills, cough, or new shortness of breath).   Past Medical History:  Diagnosis Date   Anxiety    Arthritis of knee    Atypical atrial flutter ([place])    a. Dx 12/15/2018. CHA2DS2VASc 3-->eliquis.   Carotid artery disease (HCC)    Constipation    due to Myrbetriq-     Hearing loss    wears hearing aids   Hypertension    Neuropathy    Nonobstructive Coronary artery disease    a. 07/2018 Cath: LMN nl, LAD/LCX/RCA no significant dzs. EF 55-65%.   S/P TAVR (transcatheter aortic valve replacement)    a. 09/2018 TAVR: 26 mm Edwards Sapien THV via the TF approach; b. 10/2018 Echo: EF 55-60%. Nl RV fxn. RVSP 20.64mmHg. Mild BAE. No AI or perivalvular leak.   Severe aortic stenosis    Past Surgical History:  Procedure Laterality Date   CARDIOVERSION N/A 01/20/2019   Procedure: CARDIOVERSION;  Surgeon: Minna Merritts, MD;  Location: [facility] ORS;  Service: Cardiovascular;  Laterality: N/A;   COLONOSCOPY     EYE SURGERY Bilateral    cataract   HERNIA REPAIR Left    Inguinal   NECK SURGERY  2011   Cervical Fusion   RIGHT HEART CATH AND CORONARY ANGIOGRAPHY N/A 07/28/2018   Procedure: RIGHT HEART CATH AND CORONARY ANGIOGRAPHY;  Surgeon: Minna Merritts, MD;  Location: [place] [facility];  Service: Cardiovascular;  Laterality: N/A;   TEE WITHOUT CARDIOVERSION N/A 09/13/2018   Procedure: TRANSESOPHAGEAL ECHOCARDIOGRAM (TEE);  Surgeon: Sherren Mocha, MD;  Location: [place];  Service: Open Heart Surgery;  Laterality: N/A;   TRANSCATHETER AORTIC VALVE REPLACEMENT, TRANSFEMORAL  09/13/2018   TRANSCATHETER AORTIC VALVE REPLACEMENT, TRANSFEMORAL N/A 09/13/2018   Procedure: TRANSCATHETER AORTIC VALVE REPLACEMENT, TRANSFEMORAL;  Surgeon: Sherren Mocha, MD;  Location: [place];  Service: Open Heart Surgery;  Laterality: N/A;     Current Meds  Medication Sig   Alpha-Lipoic Acid 600 MG CAPS Take 600 mg by mouth every  morning.    ALPRAZolam (XANAX) 0.5 MG tablet TAKE 1 TABLET BY MOUTH TWICE A DAY AS NEEDED FOR ANXIETY   amLODipine (NORVASC) 5 MG tablet Take 1 tablet (5 mg total) by mouth daily.   aspirin EC 81 MG tablet Take 1 tablet (81 mg total) by mouth daily.   Azelaic Acid 15 % cream Apply 1 application topically daily. After skin is thoroughly washed and patted dry, gently but thoroughly massage a thin film of azelaic acid cream into the affected area once daily   Carboxymethylcellul-Glycerin (LUBRICATING EYE DROPS OP) Place 1 drop into both eyes 2 (two) times a day.   cholecalciferol (VITAMIN D3) 25 MCG (1000 UT) tablet Take 1,000 Units by mouth daily.   cloNIDine (CATAPRES) 0.1 MG tablet Take 1 tablet (0.1 mg total) by mouth every 8 (eight) hours as needed (for systolic blood pressure (top number) greater than or equal to 160.).   furosemide (LASIX) 20  MG tablet Take 1 tablet (20 mg total) by mouth daily.   losartan (COZAAR) 100 MG tablet TAKE 1 TABLET BY MOUTH EVERY DAY   Multiple Vitamin (MULTIVITAMIN WITH MINERALS) TABS tablet Take 1 tablet by mouth daily in the afternoon. Centrum   MYRBETRIQ 50 MG TB24 tablet TAKE 1 TABLET BY MOUTH EVERY DAY   neomycin-polymyxin-hydrocortisone (CORTISPORIN) 3.5-10000-1 OTIC suspension Apply 1-2 drops daily after soaking and cover with bandaid   OVER THE COUNTER MEDICATION Take 1 tablet by mouth at bedtime as needed (sleep). [employer] PREVENTIVE NUTRITION TRI-SLEEP   polyethylene glycol (MIRALAX / GLYCOLAX) packet Take 17 g by mouth 2 (two) times daily as needed (constipation).    rosuvastatin (CRESTOR) 10 MG tablet TAKE 1 TABLET BY MOUTH EVERY DAY   tretinoin (RETIN-A) 0.1 % cream Apply 1 application topically at bedtime.    triamcinolone cream (KENALOG) 0.1 % APPLY TWICE DAILY TO AFFECTED RASH AS NEEDED     Allergies:   Nickel   Social History   Tobacco Use   Smoking status: Never Smoker   Smokeless tobacco: Never Used  Vaping Use   Vaping  Use: Never used  Substance Use Topics   Alcohol use: Yes    Alcohol/week: 7.0 standard drinks    Types: 1 Cans of beer, 6 Glasses of wine per week   Drug use: No     Family Hx: The patient's family history includes Alzheimer's disease in his sister; Lung cancer in his father; Pancreatic cancer in his brother; Thyroid disease in his mother. There is no history of Prostate cancer, Bladder Cancer, or Kidney cancer.  ROS:   Please see the history of present illness.    He denies chest pain, palpitations, dyspnea, pnd, orthopnea, n, v, dizziness, syncope, edema, weight gain, or early satiety. He reports wt loss and improvement in fluid status and sx. All other systems reviewed and are negative.  Objective:    Vital Signs:  BP 116/68    Pulse 64    Ht 6' (1.829 m)    Wt 187 lb (84.8 kg)    BMI 25.36 kg/m    VITAL SIGNS:  reviewed  Accessory clinical findings reviewed:    EKG:  No ECG reviewed.  Previous vitals reviewed today:    Temp Readings from Last 3 Encounters:  03/25/20 97.8 F (36.6 C) (Temporal)  02/14/20 (!) 97 F (36.1 C) (Tympanic)  09/22/19 (!) 97.3 F (36.3 C) (Temporal)   BP Readings from Last 3 Encounters:  06/06/20 116/68  05/23/20 (!) 143/83  05/17/20 (!) 150/80   Pulse Readings from Last 3 Encounters:  06/06/20 64  05/23/20 73  05/17/20 78    Wt Readings from Last 3 Encounters:  06/06/20 187 lb (84.8 kg)  05/23/20 188 lb (85.3 kg)  05/17/20 188 lb (85.3 kg)     Labs reviewed today:      Lab Results  Component Value Date   WBC 5.6 05/17/2020   HGB 14.4 05/17/2020   HCT 42.8 05/17/2020   MCV 96.6 05/17/2020   PLT 168 05/17/2020   Lab Results  Component Value Date   CREATININE 0.78 05/17/2020   BUN 29 (H) 05/17/2020   NA 139 05/17/2020   K 4.1 05/17/2020   CL 104 05/17/2020   CO2 25 05/17/2020   Lab Results  Component Value Date   ALT 21 02/14/2020   AST 28 02/14/2020   GGT 26 07/25/2018   ALKPHOS 69 02/14/2020   BILITOT 1.7  (H) 02/14/2020  Lab Results  Component Value Date   CHOL 139 03/18/2020   HDL 66 03/18/2020   LDLCALC 58 03/18/2020   TRIG 66 03/18/2020   CHOLHDL 2.1 03/18/2020    Lab Results  Component Value Date   HGBA1C 5.5 03/18/2020   Lab Results  Component Value Date   TSH 2.479 05/03/2020     Prior CV Studies reviewed today:    The following studies were reviewed today:  Echo 05/16/2020 1. Left ventricular ejection fraction, by estimation, is 60 to 65%. The  left ventricle has normal function. The left ventricle has no regional  wall motion abnormalities. There is moderate left ventricular hypertrophy.  Left ventricular diastolic  parameters are consistent with Grade II diastolic dysfunction  (pseudonormalization).  2. Right ventricular systolic function is normal. The right ventricular  size is normal. Moderately increased right ventricular wall thickness.  3. Left atrial size was mildly dilated.  4. 26 Edwards Sapien bioprosthetic aortic valve (TAVR) valve is present   in the aortic position. Procedure Date: 09/13/18 The aortic valve was  not well visualized. Aortic valve regurgitation is not visualized. Aortic  valve mean gradient measures 15.0 mmHg. Aortic valve Vmax measures 2.52  m/s. Unchanged from prior study  09/2019. Estimated AVA of 0.74 cm sq does not appear to correlate to  gradients measured.   Echo  09/2019 1. 26 mm S3 in aortic position. V max 2.7 m/s, MG 15 mmHG, EOA 1.26 cm2,  DI 0.33. Normal functioning TAVR without paravalvular leak or  regurgitation. Gradients are unchanged from prior. The aortic valve has  been repaired/replaced. Aortic valve  regurgitation is not visualized.  2. Left ventricular ejection fraction, by estimation, is 60 to 65%. The  left ventricle has normal function. The left ventricle has no regional  wall motion abnormalities. There is moderate concentric left ventricular  hypertrophy.Left ventricular  diastolic parameters  are consistent with Grade II diastolic dysfunction  (pseudonormalization). Elevated left atrial pressure.  3. Right ventricular systolic function is normal. The right ventricular  size is normal. There is normal pulmonary artery systolic pressure. The  estimated right ventricular systolic pressure is 28.3 mmHg.  4. Left atrial size was mildly dilated.  5. The mitral valve is grossly normal. Trivial mitral valve  regurgitation. No evidence of mitral stenosis.  6. The inferior vena cava is normal in size with greater than 50%  respiratory variability, suggesting right atrial pressure of 3 mmHg.  Comparison(s): No significant change from prior study.   Carotid US 09/2019 Summary:  Right Carotid: Velocities in the right ICA are consistent with a 1-39%  stenosis.  Left Carotid: Velocities in the left ICA are consistent with a 40-59%  stenosis.  Vertebrals: Bilateral vertebral arteries demonstrate antegrade flow.  Subclavians: Normal flow hemodynamics were seen in bilateral subclavian        arteries.  *See table(s) above for measurements and observations.  Suggest follow up study in 12 months.   [facility] 07/2018 Final Conclusions:  No significant coronary artery disease Pulmonary HTN Unable to cross aortic valve for pressures or LV function Grossly normal right heart pressures Recommendations:  Given the findings above, we will refer to TAVR clinic in [facility] No complications   ASSESSMENT & PLAN:    Chronic HFpEF  Pulmonary hypertension --S/sx of overload significantly improved since start of lasix.  Updated echo above with EF 60-65%, NRWMA, moderate LVH, G2DD, moderate increase in RVH, mild LAE. Reviewed salt and fluid restrictions. BP log above.  Continue current medications,  including lasix 20mg  daily.    Hypertension --Goal BP 130/80. Reviewed guidelines for total salt of 2 g/day and total fluid of 2 L/day.  Continue lasix 20mg  daily, amlodipine 5mg  daily,  losartan, and clonidine 0.1mg  on PRN basis with SBP greater than 160. AVS with BP measurement guidelines.  Atrialflutter/fibrillation with RVRs/p DCCV 01/2019 --No report of tachypalpitations. DCCV 01/20/2019. Reports rates well controlled. He noted adverse effects 2/2 diltiazem, as well as digoxin. He has since been taken off of these medications. In the past, beta-blocker has been deferred due to bradycardic rates at home. Per patient preference, he is not on anticoagulation and instead remains onASA 81 mg daily. CHA2DS2VASc score of at least5 (HFpEF, agex2, vascular) with recommendation fordailyOACwith Eliquis 5 mg twice daily as he does not meet reduced dosing criteria and to prevent risk of thromboembolic event.Continue to recommend anticoagulation instead of ASA 81 mg daily, as discussed.   AS s/p TAVR --S/p TAVR 09/2018. Most recent echo as above with findings of aortic valve noted as unchanged from prior study. SBE prophylaxis recommended with patient report that he takes an antibiotic before his dentist. As above, continue to recommend Eliquis with patient preference to instead take ASA. Recommend BP and heart rate control.   HLD, LDL goal below 70 --Continue statin.Continue to monitor with periodic lipid and liver function per PCP.  COVID-19 Education: The signs and symptoms of COVID-19 were discussed with the patient and how to seek care for testing (follow up with PCP or arrange E-visit).  The importance of social distancing was discussed today.  Time:   Today, I have spent 60 minutes with the patient with telehealth technology discussing the above problems.     Medication Adjustments/Labs and Tests Ordered: Current medicines are reviewed at length with the patient today.  Concerns regarding medicines are outlined above.   Disposition: In Person as scheduled with Dr. Rockey Situ.  Signed, Arvil Chaco, PA-C  06/07/2020  [facility]

## 2020-06-13 ENCOUNTER — Telehealth: Payer: Self-pay | Admitting: Cardiovascular Disease

## 2020-06-13 NOTE — Telephone Encounter (Signed)
Spoke with patient and he reports very detailed blood pressures, fluid levels, and medication changes. He has lost a total of 15 pounds in his weight attributed to fluid and since then his blood pressures have been systolic 43-88'I to low 757'V. He feels that his amlodipine needs to go back down to 2.5 mg once daily. He reports that since the fluid is gone his blood pressures have gone down. He has 2 brand new blood pressure machines which he know the numbers are accurate. Advised that I would send this over to provider for review and we would be in touch with any recommendations.

## 2020-06-13 NOTE — Telephone Encounter (Signed)
Pt c/o medication issue:  1. Name of Medication: amlodipine   2. How are you currently taking this medication (dosage and times per day)? 5 MG 1 tablet daily   3. Are you having a reaction (difficulty breathing--STAT)? BP is getting low, no energy, dizzy spells  4. What is your medication issue? Patient would like to change back from 5MG  to 2.5 MG amlodipine.  Please call to discuss.

## 2020-06-14 MED ORDER — AMLODIPINE BESYLATE 2.5 MG PO TABS: 2.5000 mg | ORAL_TABLET | Freq: Every day | ORAL | 3 refills | Status: DC

## 2020-06-14 NOTE — Telephone Encounter (Signed)
Patient wants to go back to amlodipine 2.5 mg once daily dose to see if blood pressures do not get as low. He was on this dose for over a year and blood pressures were well controlled. So he does not want to hold the amlodipine he just wants to take the lower dose. Advised I would check with provider and be in touch.

## 2020-06-14 NOTE — Telephone Encounter (Signed)
OK to hold amlodipine if systolic pressures are running in the 80s-90s.

## 2020-06-14 NOTE — Telephone Encounter (Signed)
OK to decrease to amlodipine 2.5mg  qd and monitor BP after this decrease.

## 2020-06-14 NOTE — Telephone Encounter (Signed)
Reviewed with patient recommendations to decrease amlodipine down to 2.5 mg once daily and then monitor blood pressures. Advised that hopefully that decrease in dose will keep his blood pressures where they need to be. He was very appreciative for the call with no further questions at this time.

## 2020-06-18 NOTE — Telephone Encounter (Signed)
Copied from [place] [id number]. Topic: General - Other >> Jun 18, 2020 10:55 AM Leward Quan A wrote: Reason for CRM: Patient called in to inquire of Dr Marthann Schiller nurse say that he have questions about his constipation problems. Can be reached at Ph# [phone]   Patient stated that the constipation has been for the past couple of days.Marland Kitchen   He normally takes Miralax (2 capfuls) maybe three times a week but he wants to see if he can increase it safely to 5 times a week.   He said that the constipation is worse due to the Myrbetriq.  He would like to know also if there is something he can take along side the miralax to keep him regular.

## 2020-06-18 NOTE — Telephone Encounter (Signed)
Please notify patient  Yes. Miralax is extremely safe. It will not cause dehydration or other issues. The only issue is it can cause too much loose stool if taken too much but it will not cause any medical harm. He can increase up to 5 times a week without any problem. He should have plenty of flexibility to take it as much or as little as needed.  [name], [facility] 06/18/2020, 4:21 PM

## 2020-06-19 ENCOUNTER — Ambulatory Visit
Admission: EM | Admit: 2020-06-19 | Discharge: 2020-06-19 | Disposition: A | Discharge status: Home / Self Care | Payer: Medicare Other | Attending: Sports Medicine | Admitting: Sports Medicine

## 2020-06-19 ENCOUNTER — Encounter: Payer: Self-pay | Admitting: Emergency Medicine

## 2020-06-19 ENCOUNTER — Other Ambulatory Visit: Payer: Self-pay

## 2020-06-19 DIAGNOSIS — Z23 Encounter for immunization: Secondary | ICD-10-CM

## 2020-06-19 DIAGNOSIS — S6992XA Unspecified injury of left wrist, hand and finger(s), initial encounter: Secondary | ICD-10-CM | POA: Diagnosis not present

## 2020-06-19 DIAGNOSIS — S61432A Puncture wound without foreign body of left hand, initial encounter: Secondary | ICD-10-CM

## 2020-06-19 MED ORDER — TETANUS-DIPHTHERIA TOXOIDS TD 5-2 LFU IM INJ
0.5000 mL | INJECTION | Freq: Once | INTRAMUSCULAR | Status: AC
  Administered 2020-06-19: 0.5 mL via INTRAMUSCULAR

## 2020-06-19 NOTE — ED Triage Notes (Addendum)
Patient in today c/o right and left hand injury today while trying to cut his hair. Patient states he was cutting his hair with clippers and the blade slipped and cut his right index finger. Patient was trying to tighten the blade on the clippers with a screw driver and the screwdriver slipped and went into his left hand between the thumb and index finger. Patient usually takes premedication prior to dental appointments for a heart valve replacement. Patient states he went ahead and took that after he hurt his hands. Patient states he cleaned his wounds with peroxide. Patient's last tetanus shot was 08/01/10.

## 2020-06-19 NOTE — Telephone Encounter (Signed)
The pt was notified of the providers recommendation. He verbalize understanding, no questions or concerns. 

## 2020-06-19 NOTE — ED Provider Notes (Signed)
[facility]    CSN: [id number] Arrival date & time: 06/19/20  1646      History   Chief Complaint Chief Complaint  Patient presents with  . Hand Injury    DOI 06/19/20    HPI James Cardenas is a 80 y.o. male.   Patient pleasant 80 year old right-hand-dominant male who presents for evaluation of the above issue.  His injury occurred shortly prior to arrival.  His concern is that he is on a baby aspirin and was bleeding quite a lot mostly from his left hand.       Past Medical History:  Diagnosis Date  . Anxiety   . Arthritis of knee   . Atypical atrial flutter ([place])    a. Dx 12/15/2018. CHA2DS2VASc 3-->eliquis.  . Carotid artery disease ([place])   . 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 Hearing loss    wears hearing aids  . Hypertension   . Neuropathy   . Nonobstructive Coronary artery disease    a. 07/2018 Cath: LMN nl, LAD/LCX/RCA no significant dzs. EF 55-65%.  . S/P TAVR (transcatheter aortic valve replacement)    a. 09/2018 TAVR: 26 mm Edwards Sapien THV via the TF approach; b. 10/2018 Echo: EF 55-60%. Nl RV fxn. RVSP 20.41mmHg. Mild BAE. No AI or perivalvular leak.  . Severe aortic stenosis     Patient Active Problem List   Diagnosis Date Noted  . Surgery follow-up examination 04/18/2020  . Plantar fasciitis 02/12/2020  . Elevated hemoglobin A1c 11/25/2018  . S/P TAVR (transcatheter aortic valve replacement)   . Carotid artery disease ([place])   . Pulmonary hypertension ([place]) 05/02/2018  . Severe aortic valve stenosis 10/12/2017  . Dupuytren's contracture of left hand 10/12/2017  . MGUS (monoclonal gammopathy of unknown significance) 10/12/2017  . Situational anxiety 07/02/2016  . OAB (overactive bladder) 07/02/2016  . Peripheral neuropathy 07/02/2016  . Constipation 07/02/2016  . Osteoarthritis of multiple joints 07/02/2016  . ED (erectile dysfunction) 05/28/2016  . BPH (benign prostatic hyperplasia) 03/11/2016  . Essential hypertension 01/24/2014     Past Surgical History:  Procedure Laterality Date  . CARDIOVERSION N/A 01/20/2019   Procedure: CARDIOVERSION;  Surgeon: Minna Merritts, MD;  Location: [facility];  Service: Cardiovascular;  Laterality: N/A;  . COLONOSCOPY    . EYE SURGERY Bilateral    cataract  . HERNIA REPAIR Left    Inguinal  . NECK SURGERY  2011   Cervical Fusion  . RIGHT HEART CATH AND CORONARY ANGIOGRAPHY N/A 07/28/2018   Procedure: RIGHT HEART CATH AND CORONARY ANGIOGRAPHY;  Surgeon: Minna Merritts, MD;  Location: [facility];  Service: Cardiovascular;  Laterality: N/A;  . TEE WITHOUT CARDIOVERSION N/A 09/13/2018   Procedure: TRANSESOPHAGEAL ECHOCARDIOGRAM (TEE);  Surgeon: Sherren Mocha, MD;  Location: [facility];  Service: Open Heart Surgery;  Laterality: N/A;  . TRANSCATHETER AORTIC VALVE REPLACEMENT, TRANSFEMORAL  09/13/2018  . TRANSCATHETER AORTIC VALVE REPLACEMENT, TRANSFEMORAL N/A 09/13/2018   Procedure: TRANSCATHETER AORTIC VALVE REPLACEMENT, TRANSFEMORAL;  Surgeon: Sherren Mocha, MD;  Location: [facility];  Service: Open Heart Surgery;  Laterality: N/A;       Home Medications    Prior to Admission medications   Medication Sig Start Date End Date Taking? Authorizing Provider  Alpha-Lipoic Acid 600 MG CAPS Take 600 mg by mouth every morning.    Yes [provider]  ALPRAZolam (XANAX) 0.5 MG tablet TAKE 1 TABLET BY MOUTH TWICE A DAY AS NEEDED FOR ANXIETY 03/12/20  Yes Karamalegos, Alexander J, DO  amLODipine (NORVASC) 2.5 MG tablet Take 1 tablet (2.5 mg total) by mouth daily. 06/14/20 09/12/20 Yes Visser, Jacquelyn D, PA-C  amoxicillin (AMOXIL) 500 MG capsule amoxicillin 500 mg capsule  PLEASE SEE ATTACHED FOR DETAILED DIRECTIONS   Yes [provider]  aspirin EC 81 MG tablet Take 1 tablet (81 mg total) by mouth daily. 03/21/19  Yes Minna Merritts, MD  Azelaic Acid 15 % cream Apply 1 application topically daily. After skin is thoroughly washed and patted dry, gently but thoroughly  massage a thin film of azelaic acid cream into the affected area once daily   Yes [provider]  cholecalciferol (VITAMIN D3) 25 MCG (1000 UT) tablet Take 1,000 Units by mouth daily.   Yes [provider]  cloNIDine (CATAPRES) 0.1 MG tablet Take 1 tablet (0.1 mg total) by mouth every 8 (eight) hours as needed (for systolic blood pressure (top number) greater than or equal to 160.). 05/03/20  Yes Visser, Jacquelyn D, PA-C  furosemide (LASIX) 20 MG tablet Take 1 tablet (20 mg total) by mouth daily. 05/03/20 08/01/20 Yes Visser, Jacquelyn D, PA-C  losartan (COZAAR) 100 MG tablet TAKE 1 TABLET BY MOUTH EVERY DAY 02/07/20  Yes Gollan, Kathlene November, MD  Multiple Vitamin (MULTIVITAMIN WITH MINERALS) TABS tablet Take 1 tablet by mouth daily in the afternoon. Centrum   Yes [provider]  MYRBETRIQ 50 MG TB24 tablet TAKE 1 TABLET BY MOUTH EVERY DAY 05/06/20  Yes Stoioff, Ronda Fairly, MD  polyethylene glycol (MIRALAX / GLYCOLAX) packet Take 17 g by mouth 2 (two) times daily as needed (constipation).    Yes [provider]  rosuvastatin (CRESTOR) 10 MG tablet TAKE 1 TABLET BY MOUTH EVERY DAY 10/04/19  Yes Gollan, Kathlene November, MD  tretinoin (RETIN-A) 0.1 % cream Apply 1 application topically at bedtime.  07/10/16  Yes [provider]  triamcinolone cream (KENALOG) 0.1 % APPLY TWICE DAILY TO AFFECTED RASH AS NEEDED 04/20/19  Yes [provider]  Carboxymethylcellul-Glycerin (LUBRICATING EYE DROPS OP) Place 1 drop into both eyes 2 (two) times a day.    [provider]  neomycin-polymyxin-hydrocortisone (CORTISPORIN) 3.5-10000-1 OTIC suspension Apply 1-2 drops daily after soaking and cover with bandaid 04/04/20   McDonald, Stephan Minister, DPM  OVER THE COUNTER MEDICATION Take 1 tablet by mouth at bedtime as needed (sleep). [place] PREVENTIVE NUTRITION TRI-SLEEP    [provider]    Family History Family History  Problem Relation Age of Onset  . Thyroid disease  Mother   . Lung cancer Father   . Pancreatic cancer Brother   . Alzheimer's disease Sister   . Prostate cancer Neg Hx   . Bladder Cancer Neg Hx   . Kidney cancer Neg Hx     Social History Social History   Tobacco Use  . Smoking status: Never Smoker  . Smokeless tobacco: Never Used  Vaping Use  . Vaping Use: Never used  Substance Use Topics  . Alcohol use: Yes    Alcohol/week: 7.0 standard drinks    Types: 1 Cans of beer, 6 Glasses of wine per week  . Drug use: No     Allergies   Nickel   Review of Systems Review of Systems  Constitutional: Negative for chills, diaphoresis, fatigue and fever.  HENT: Negative.   Eyes: Negative.   Respiratory: Negative.   Cardiovascular: Negative.   Gastrointestinal: Negative.   Genitourinary: Negative.   Musculoskeletal: Positive for arthralgias. Negative for joint  swelling and myalgias.  Skin: Positive for wound. Negative for color change, pallor and rash.  Neurological: Negative for dizziness, seizures, syncope, speech difficulty, light-headedness, numbness and headaches.  Hematological: Bruises/bleeds easily.  All other systems reviewed and are negative.    Physical Exam Triage Vital Signs ED Triage Vitals  Enc Vitals Group     BP 06/19/20 1705 (!) 166/88     Pulse Rate 06/19/20 1705 83     Resp 06/19/20 1705 18     Temp 06/19/20 1705 98.4 F (36.9 C)     Temp Source 06/19/20 1705 Oral     SpO2 06/19/20 1705 100 %     Weight 06/19/20 1707 188 lb (85.3 kg)     Height 06/19/20 1707 6\' 3"  (1.905 m)     Head Circumference --      Peak Flow --      Pain Score 06/19/20 1706 6     Pain Loc --      Pain Edu? --      Excl. in [place]? --    No data found.  Updated Vital Signs BP (!) 166/88 (BP Location: Right Arm)   Pulse 83   Temp 98.4 F (36.9 C) (Oral)   Resp 18   Ht 6\' 3"  (1.905 m)   Wt 85.3 kg   SpO2 100%   BMI 23.50 kg/m   Visual Acuity Right Eye Distance:   Left Eye Distance:   Bilateral Distance:     Right Eye Near:   Left Eye Near:    Bilateral Near:     Physical Exam Vitals and nursing note reviewed.  Constitutional:      General: He is not in acute distress.    Appearance: Normal appearance. He is not ill-appearing, toxic-appearing or diaphoretic.  HENT:     Head: Normocephalic and atraumatic.  Cardiovascular:     Rate and Rhythm: Normal rate and regular rhythm.     Pulses: Normal pulses.     Heart sounds: Murmur heard.  No friction rub. No gallop.   Pulmonary:     Effort: Pulmonary effort is normal.     Breath sounds: Normal breath sounds.  Musculoskeletal:     Comments: Tiny cut on the right index finger.  Not actively bleeding.  Small puncture wound on the left hand on the dorsal aspect between the thumb and second metacarpal.  No evidence of foreign body.  There is some bleeding.  Wound is not large enough to suture.  Flexor and extensor tendons are intact.  Skin:    General: Skin is warm.     Capillary Refill: Capillary refill takes less than 2 seconds.     Coloration: Skin is not jaundiced.     Findings: Lesion present. No bruising, erythema or rash.  Neurological:     General: No focal deficit present.     Mental Status: He is alert and oriented to person, place, and time.      UC Treatments / Results  Labs (all labs ordered are listed, but only abnormal results are displayed) Labs Reviewed - No data to display  EKG   Radiology No results found.  Procedures Procedures (including critical care time)  Medications Ordered in UC Medications  tetanus & diphtheria toxoids (adult) (TENIVAC) injection 0.5 mL (0.5 mLs Intramuscular Given 06/19/20 1719)    Initial Impression / Assessment and Plan / UC Course  I have reviewed the triage vital signs and the nursing notes.  Pertinent labs & imaging results  that were available during my care of the patient were reviewed by me and considered in my medical decision making (see chart for details).  Clinical  impression: 1.  Puncture wound to left hand. 2.  Small cut to the right index finger.  Treatment plan: 1.  The findings and treatment plan were discussed in detail with the patient.  Patient was in agreement. 2.  Regarding his right index finger I just recommended keeping a Band-Aid on until it heals. 3.  Do not take any further amoxicillin. 4.  No sutures required to his left hand but I went ahead and put Dermabond on not given that he is on a baby aspirin it was bleeding. 5.  Educational handout was provided.  This included Dermabond. 6.  It is been almost 10 years since his last tetanus so we went ahead and gave him that shot today. 7.  Supportive care, over-the-counter meds as needed. 8.  Advised him that if he was worried at all, developed any fevers, redness around that area, or concerning signs for infection he should contact his primary care physician's office or go to the emergency room. 9.  Follow-up here as needed.    Final Clinical Impressions(s) / UC Diagnoses   Final diagnoses:  Puncture wound of left hand without foreign body, initial encounter  Hand injury, left, initial encounter     Discharge Instructions     You have a puncture wound to your left hand.  Given that you are on a baby aspirin I chose to go ahead and use Dermabond. I gave you educational handouts on the Dermabond as well as the puncture wound. Please keep the area clean and dry as much as possible.  The glue should fall off on its own. Supportive care, over-the-counter meds as needed. If you develop any fevers chills sweats or significant redness around that wound please seek out care of your primary care provider, come back here or go to the emergency room.  I hope you get to feeling better, Dr. Drema Dallas    ED Prescriptions    None     PDMP not reviewed this encounter.   Verda Cumins, MD 06/19/20 [id number]

## 2020-06-19 NOTE — Discharge Instructions (Signed)
You have a puncture wound to your left hand.  Given that you are on a baby aspirin I chose to go ahead and use Dermabond. I gave you educational handouts on the Dermabond as well as the puncture wound. Please keep the area clean and dry as much as possible.  The glue should fall off on its own. Supportive care, over-the-counter meds as needed. If you develop any fevers chills sweats or significant redness around that wound please seek out care of your primary care provider, come back here or go to the emergency room.  I hope you get to feeling better, Dr. Drema Dallas

## 2020-06-20 ENCOUNTER — Encounter: Payer: Self-pay | Admitting: Podiatry

## 2020-06-20 ENCOUNTER — Telehealth: Payer: Self-pay

## 2020-06-20 ENCOUNTER — Ambulatory Visit (INDEPENDENT_AMBULATORY_CARE_PROVIDER_SITE_OTHER): Payer: Medicare Other | Admitting: Podiatry

## 2020-06-20 DIAGNOSIS — B351 Tinea unguium: Secondary | ICD-10-CM

## 2020-06-20 DIAGNOSIS — M79676 Pain in unspecified toe(s): Secondary | ICD-10-CM

## 2020-06-20 DIAGNOSIS — M722 Plantar fascial fibromatosis: Secondary | ICD-10-CM

## 2020-06-20 DIAGNOSIS — G629 Polyneuropathy, unspecified: Secondary | ICD-10-CM

## 2020-06-20 NOTE — Progress Notes (Signed)
This patient returns to my office for at risk foot care.  This patient requires this care by a professional since this patient will be at risk due to having neuropathy.   This patient is unable to cut nails himself since the patient cannot reach his nails.These nails are painful walking and wearing shoes.  This patient presents for at risk foot care today.  General Appearance  Alert, conversant and in no acute stress.  Vascular  Dorsalis pedis and posterior tibial  pulses are palpable  bilaterally.  Capillary return is within normal limits  bilaterally. Temperature is within normal limits  bilaterally.  Neurologic  Senn-Weinstein monofilament wire test within normal limits  bilaterally. Muscle power within normal limits bilaterally.  Nails Thick disfigured discolored nails with subungual debris  from hallux to fifth toes bilaterally. No evidence of bacterial infection or drainage bilaterally.  Orthopedic  No limitations of motion  feet .  No crepitus or effusions noted.  No bony pathology or digital deformities noted.  Palpable pain along the medial band right foot.  Cavus foot  B/L.  Skin  normotropic skin with no porokeratosis noted bilaterally.  No signs of infections or ulcers noted.     Onychomycosis  Pain in right toes  Pain in left toes  Consent was obtained for treatment procedures.   Mechanical debridement of nails 1-5  bilaterally performed with a nail nipper.  Filed with dremel without incident.   Return office visit  9 weeks                    Told patient to return for periodic foot care and evaluation due to potential at risk complications.   Gardiner Barefoot DPM

## 2020-06-20 NOTE — Telephone Encounter (Signed)
Pt brought in orthotics to be refurbished. Sending to [place] office by Dr Prudence Davidson this afternoon and I have advised the pt that I will contact him when they are ready to be picked up.

## 2020-06-24 DIAGNOSIS — H26493 Other secondary cataract, bilateral: Secondary | ICD-10-CM | POA: Diagnosis not present

## 2020-06-25 ENCOUNTER — Telehealth: Payer: Self-pay | Admitting: Cardiovascular Disease

## 2020-06-25 NOTE — Telephone Encounter (Signed)
Patient calling States that BP meds are working well  Believes he may be having side effects from the water pills   Pt c/o medication issue:  1. Name of Medication: furosemide (LASIX)   2. How are you currently taking this medication (dosage and times per day)? 20 MG 1 daily  3. Are you having a reaction (difficulty breathing--STAT)? Dizziness, mild headaches, and sometimes low BP  4. What is your medication issue? Would like to know if he can change the frequency

## 2020-06-25 NOTE — Telephone Encounter (Signed)
Spoke to pt. Would like to ask provider if he may decr frequency and/or dose of Lasix.  Recent decr in Amlodipine to 2.5mg  daily (see note 06/13/20). And continues Lasix 20mg  daily, Losartan 100mg  daily.  Does have Clonidine 0.1mg  PRN, but has not had to take in a while d/t low BP readings.   "Average of over 20 readings of BP are: 104/60s" SBP consistently in range of 98-110.  Pt also reports new onset "mild lightheadedness throughout the day."  States swelling has resolved - "after cold virus 2 months ago, my BP has seemed to significantly decline and my swelling has resolved. I believe the cold virus caused these issues and now they are improved."  Pt denies low BP or symptoms at this time while on phone.   Pt's wt prior was 190-192lb per pt. Now weight is "consistently staying between 186-188lbs."   Overall, pt has voiced concern that Lasix is also contributing to low BP. Reports decr in energy and mild headaches "which is new." Pt has done "research on my water pill" and would like to ask provider if we can consider changing dose and/or frequency.   Advised pt he may hold Lasix if feeling light headed/dizzy or if SBP <100 even without accompanying symptoms. And notified pt I will forward to provider for recc. Pt verbalized understanding and appreciative.

## 2020-06-26 NOTE — Telephone Encounter (Signed)
Spoke with the patient. Patient wanted Jacquelyn to know that the elevated BP reading was after he accidentally cut his hand with a screw driver and was seen in a urgent care for treatment. He has over 200 BP readings over the last 2 weeks with his systolic ranging between 409-735. I asked him why he was checking his BP so often. He sts that he is anxious. He reports increased headaches and dizziness that he feel is related to low BP. Pt insist that he will need to reduce one of his anti-hypertensive meds. Adv the pt that I will fwd the update to Marrianne Mood, PA and her nurse will call back with her recommendation.

## 2020-06-26 NOTE — Telephone Encounter (Signed)
Spoke to pt this morning.  Notified that per JV recc below, we will defer any medication changes until he is seen by provider in the office.  Rescheduled current appt with Dr. Rockey Situ in May to opening next week 07/03/20 @ 0820 (pt prefers to see Dr. Rockey Situ).  Advised pt to hold BP meds if low BP and/or feeling dizzy in the interim. Otherwise, will see in office next week.  Pt appreciative of help and verbalizes understanding. Pt has no further questions or concerns at this time.

## 2020-06-26 NOTE — Telephone Encounter (Signed)
Recent BP in the ED: 166/88

## 2020-06-26 NOTE — Telephone Encounter (Signed)
Please have him defer any medication changes until seen by a provider in office.

## 2020-07-02 NOTE — Progress Notes (Signed)
Evaluation Performed:  Follow-up visit  Date:  07/03/2020   ID:  James Cardenas, DOB April 05, 1941, MRN [id number]  Patient Location:  [address] [place] [address]   Provider location:   [facility], [place] office  PCP:  Olin Hauser, DO  Cardiologist:  Arvid Right [facility]  Chief Complaint  Patient presents with  . Blood pressure management     Patient c/o decreased blood pressure and needs to have some medications reviewed. Medications reviewed by the patient verbally.     History of Present Illness:    James Cardenas is a 80 y.o. male  past medical history of Aortic valve stenosis , severe, s/p TAVR Anxiety GERD Essential hypertension Overactive bladder Peripheral neuropathy Carotid stenosis:  left ICA are consistent with a 40-59% in 09/2019 cardiac catheterization on 07/28/2018 showing no significant coronary disease.  MGUS (monoclonal gammopathy of unknown significance) Chronic LE edema trace to 1 + to mid shin Who presents for follow-up of his severe aortic valve stenosis, TAVR, ,atrial flutter  LOV 09/2019 Echo 05/16/2020  On amlodipine 2.5 daily, Losartan 100 daily,  Lasix 20 daily, crestor,  Clonidine PRN home  Questions about amlodipine 03/2020: got a cold Gained 8 pounds Significant leg swelling Started on norvasc and lasix  Weight back to baseline  Blood pressure home running low,  BUN elevated   Echo 06/2020 1. Left ventricular ejection fraction, by estimation, is 60 to 65%. The  left ventricle has normal function. The left ventricle has no regional  wall motion abnormalities. There is moderate left ventricular hypertrophy.  Left ventricular diastolic  parameters are consistent with Grade II diastolic dysfunction  (pseudonormalization).  2. Right ventricular systolic function is normal. The right ventricular  size is normal. Moderately increased right ventricular wall thickness.  3. Left atrial size was  mildly dilated.  4. 26 Edwards Sapien bioprosthetic aortic valve (TAVR) valve is present   in the aortic position. Procedure Date: 09/13/18 The aortic valve was  not well visualized. Aortic valve regurgitation is not visualized. Aortic  valve mean gradient measures 15.0 mmHg. Aortic valve Vmax measures 2.52  m/s.   EKG personally reviewed by myself on todays visit NSR rate 69 bpm  Other past medical history reviewed  peripheral neuropathy affecting his balance  successful TAVR in 09/2018 a89mm Edwards Sapien 3 THV via the TF approach on 09/13/2018.   Post operative echoshowed EF 55%, normally functioning TAVR with no PVL and mean gradient 19 mm Hg.He was discharged on aspirin and plavix.  1 month Echo 10/18/18 showed EF 55-60%, mean gradient 17 mmHG and no PVL.  On his last day of cardiac rehab noted to be in new onset atrial flutter with RVR underwent successful cardioversion on 01/20/2019.  elevated CHADS2VASc of at least 3 (HTN, age x 2)  He chose not to be on Eliquis/anticoagulation, reported having significant anxiety concerning the medication Reports prior history of depression  Cardiac catheterization July 28, 2018 Left ventriculography: Left ventricular systolic function is normal, LVEF is estimated at 55-65%, there is no significant mitral regurgitation , no significant aortic valve stenosis   Past Medical History:  Diagnosis Date  . Anxiety   . Arthritis of knee   . Atypical atrial flutter ([place])    a. Dx 12/15/2018. CHA2DS2VASc 3-->eliquis.  . Carotid artery disease ([place])   . 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 Hearing loss    wears hearing  aids  . Hypertension   . Neuropathy   . Nonobstructive Coronary artery disease    a. 07/2018 Cath: LMN nl, LAD/LCX/RCA no significant dzs. EF 55-65%.  . S/P TAVR (transcatheter aortic valve replacement)    a. 09/2018 TAVR: 26 mm Edwards Sapien THV via the TF approach; b. 10/2018 Echo: EF 55-60%. Nl RV fxn. RVSP 20.65mmHg.  Mild BAE. No AI or perivalvular leak.  . Severe aortic stenosis    Past Surgical History:  Procedure Laterality Date  . CARDIOVERSION N/A 01/20/2019   Procedure: CARDIOVERSION;  Surgeon: Minna Merritts, MD;  Location: [facility] ORS;  Service: Cardiovascular;  Laterality: N/A;  . COLONOSCOPY    . EYE SURGERY Bilateral    cataract  . HERNIA REPAIR Left    Inguinal  . NECK SURGERY  2011   Cervical Fusion  . RIGHT HEART CATH AND CORONARY ANGIOGRAPHY N/A 07/28/2018   Procedure: RIGHT HEART CATH AND CORONARY ANGIOGRAPHY;  Surgeon: Minna Merritts, MD;  Location: [place] CV LAB;  Service: Cardiovascular;  Laterality: N/A;  . TEE WITHOUT CARDIOVERSION N/A 09/13/2018   Procedure: TRANSESOPHAGEAL ECHOCARDIOGRAM (TEE);  Surgeon: Sherren Mocha, MD;  Location: [place];  Service: Open Heart Surgery;  Laterality: N/A;  . TRANSCATHETER AORTIC VALVE REPLACEMENT, TRANSFEMORAL  09/13/2018  . TRANSCATHETER AORTIC VALVE REPLACEMENT, TRANSFEMORAL N/A 09/13/2018   Procedure: TRANSCATHETER AORTIC VALVE REPLACEMENT, TRANSFEMORAL;  Surgeon: Sherren Mocha, MD;  Location: [place];  Service: Open Heart Surgery;  Laterality: N/A;     Current Meds  Medication Sig  . Alpha-Lipoic Acid 600 MG CAPS Take 600 mg by mouth every morning.   Marland Kitchen ALPRAZolam (XANAX) 0.5 MG tablet TAKE 1 TABLET BY MOUTH TWICE A DAY AS NEEDED FOR ANXIETY  . amLODipine (NORVASC) 2.5 MG tablet Take 1 tablet (2.5 mg total) by mouth daily.  Marland Kitchen amoxicillin (AMOXIL) 500 MG capsule amoxicillin 500 mg capsule  PLEASE SEE ATTACHED FOR DETAILED DIRECTIONS  . aspirin EC 81 MG tablet Take 1 tablet (81 mg total) by mouth daily.  . Azelaic Acid 15 % cream Apply 1 application topically daily. After skin is thoroughly washed and patted dry, gently but thoroughly massage a thin film of azelaic acid cream into the affected area once daily  . Carboxymethylcellul-Glycerin (LUBRICATING EYE DROPS OP) Place 1 drop into both eyes 2 (two) times a day.  . cholecalciferol  (VITAMIN D3) 25 MCG (1000 UT) tablet Take 1,000 Units by mouth daily.  . cloNIDine (CATAPRES) 0.1 MG tablet Take 1 tablet (0.1 mg total) by mouth every 8 (eight) hours as needed (for systolic blood pressure (top number) greater than or equal to 160.).  Marland Kitchen furosemide (LASIX) 20 MG tablet Take 1 tablet (20 mg total) by mouth daily.  Marland Kitchen losartan (COZAAR) 100 MG tablet TAKE 1 TABLET BY MOUTH EVERY DAY  . Multiple Vitamin (MULTIVITAMIN WITH MINERALS) TABS tablet Take 1 tablet by mouth daily in the afternoon. Centrum  . MYRBETRIQ 50 MG TB24 tablet TAKE 1 TABLET BY MOUTH EVERY DAY  . neomycin-polymyxin-hydrocortisone (CORTISPORIN) 3.5-10000-1 OTIC suspension Apply 1-2 drops daily after soaking and cover with bandaid  . OVER THE COUNTER MEDICATION Take 1 tablet by mouth at bedtime as needed (sleep). GNC PREVENTIVE NUTRITION TRI-SLEEP  . polyethylene glycol (MIRALAX / GLYCOLAX) packet Take 17 g by mouth 2 (two) times daily as needed (constipation).   . rosuvastatin (CRESTOR) 10 MG tablet TAKE 1 TABLET BY MOUTH EVERY DAY  . tretinoin (RETIN-A) 0.1 % cream Apply 1 application topically at bedtime.   Marland Kitchen  triamcinolone cream (KENALOG) 0.1 % APPLY TWICE DAILY TO AFFECTED RASH AS NEEDED    Allergies:   Nickel   Social History   Tobacco Use  . Smoking status: Never Smoker  . Smokeless tobacco: Never Used  Vaping Use  . Vaping Use: Never used  Substance Use Topics  . Alcohol use: Yes    Alcohol/week: 7.0 standard drinks    Types: 1 Cans of beer, 6 Glasses of wine per week  . Drug use: No     Family Hx: The patient's family history includes Alzheimer's disease in his sister; Lung cancer in his father; Pancreatic cancer in his brother; Thyroid disease in his mother. There is no history of Prostate cancer, Bladder Cancer, or Kidney cancer.  ROS:   Please see the history of present illness.    Review of Systems  Constitutional: Negative.   HENT: Negative.   Respiratory: Negative.   Cardiovascular:  Positive for leg swelling.  Gastrointestinal: Negative.   Musculoskeletal: Negative.   Neurological: Negative.   Psychiatric/Behavioral: Negative.   All other systems reviewed and are negative.   Labs/Other Tests and Data Reviewed:    Recent Labs: 02/14/2020: ALT 21 05/03/2020: TSH 2.479 05/17/2020: B Natriuretic Peptide 162.8; BUN 29; Creatinine, Ser 0.78; Hemoglobin 14.4; Platelets 168; Potassium 4.1; Sodium 139   Recent Lipid Panel Lab Results  Component Value Date/Time   CHOL 139 03/18/2020 08:12 AM   CHOL 171 11/06/2013 12:00 AM   TRIG 66 03/18/2020 08:12 AM   TRIG 45 11/06/2013 12:00 AM   HDL 66 03/18/2020 08:12 AM   HDL 72 11/06/2013 12:00 AM   CHOLHDL 2.1 03/18/2020 08:12 AM   LDLCALC 58 03/18/2020 08:12 AM    Wt Readings from Last 3 Encounters:  07/03/20 189 lb (85.7 kg)  06/19/20 188 lb (85.3 kg)  06/06/20 187 lb (84.8 kg)     Exam:    Vital Signs: Vital signs may also be detailed in the HPI BP 130/80 (BP Location: Left Arm, Patient Position: Sitting, Cuff Size: Normal)   Pulse 69   Ht 6\' 3"  (1.905 m)   Wt 189 lb (85.7 kg)   SpO2 98%   BMI 23.62 kg/m    Constitutional:  oriented to person, place, and time. No distress.  HENT:  Head: Grossly normal Eyes:  no discharge. No scleral icterus.  Neck: No JVD, no carotid bruits  Cardiovascular: Regular rate and rhythm, no murmurs appreciated 1+ le edema  b/l Pulmonary/Chest: Clear to auscultation bilaterally, no wheezes or rails Abdominal: Soft.  no distension.  no tenderness.  Musculoskeletal: Normal range of motion Neurological:  normal muscle tone. Coordination normal. No atrophy Skin: Skin warm and dry Psychiatric: normal affect, pleasant   ASSESSMENT & PLAN:    Severe aortic stenosis Successful TAVR,  Follow-up echocardiogram, stable valve Echo reviewed  Atrial flutter Maintaining NSR  Anxiety Working with PMD  Essential hypertension Stop amloidpine, BP running low Cut back on Lasix to 3  days a week BUN running high 1 month ago on Lasix daily and with blood pressure running low and orthostasis symptoms, he is overmedicated  MGUS (monoclonal gammopathy of unknown significance) Managed by primary care  Leg edema Suspect component of venous insufficiency Recommended compression hose Stop Ca channel blockers   Total encounter time more than 25 minutes  Greater than 50% was spent in counseling and coordination of care with the patient   Signed, Ida Rogue, MD  07/03/2020 8:31 AM    [facility]  [employer] [address], [place], [place] [address]

## 2020-07-03 ENCOUNTER — Encounter: Payer: Self-pay | Admitting: Cardiovascular Disease

## 2020-07-03 ENCOUNTER — Other Ambulatory Visit: Payer: Self-pay

## 2020-07-03 ENCOUNTER — Ambulatory Visit (INDEPENDENT_AMBULATORY_CARE_PROVIDER_SITE_OTHER): Payer: Medicare Other | Admitting: Cardiovascular Disease

## 2020-07-03 VITALS — BP 130/80 | HR 69 | Ht 75.0 in | Wt 189.0 lb

## 2020-07-03 DIAGNOSIS — E785 Hyperlipidemia, unspecified: Secondary | ICD-10-CM | POA: Diagnosis not present

## 2020-07-03 DIAGNOSIS — I350 Nonrheumatic aortic (valve) stenosis: Secondary | ICD-10-CM

## 2020-07-03 DIAGNOSIS — F418 Other specified anxiety disorders: Secondary | ICD-10-CM | POA: Diagnosis not present

## 2020-07-03 DIAGNOSIS — D2262 Melanocytic nevi of left upper limb, including shoulder: Secondary | ICD-10-CM | POA: Diagnosis not present

## 2020-07-03 DIAGNOSIS — Z85828 Personal history of other malignant neoplasm of skin: Secondary | ICD-10-CM | POA: Diagnosis not present

## 2020-07-03 DIAGNOSIS — L718 Other rosacea: Secondary | ICD-10-CM | POA: Diagnosis not present

## 2020-07-03 DIAGNOSIS — Z952 Presence of prosthetic heart valve: Secondary | ICD-10-CM

## 2020-07-03 DIAGNOSIS — Z8679 Personal history of other diseases of the circulatory system: Secondary | ICD-10-CM | POA: Diagnosis not present

## 2020-07-03 DIAGNOSIS — X32XXXA Exposure to sunlight, initial encounter: Secondary | ICD-10-CM | POA: Diagnosis not present

## 2020-07-03 DIAGNOSIS — I10 Essential (primary) hypertension: Secondary | ICD-10-CM | POA: Diagnosis not present

## 2020-07-03 DIAGNOSIS — I5032 Chronic diastolic (congestive) heart failure: Secondary | ICD-10-CM | POA: Diagnosis not present

## 2020-07-03 DIAGNOSIS — D2261 Melanocytic nevi of right upper limb, including shoulder: Secondary | ICD-10-CM | POA: Diagnosis not present

## 2020-07-03 DIAGNOSIS — D225 Melanocytic nevi of trunk: Secondary | ICD-10-CM | POA: Diagnosis not present

## 2020-07-03 DIAGNOSIS — L570 Actinic keratosis: Secondary | ICD-10-CM | POA: Diagnosis not present

## 2020-07-03 DIAGNOSIS — D2272 Melanocytic nevi of left lower limb, including hip: Secondary | ICD-10-CM | POA: Diagnosis not present

## 2020-07-03 MED ORDER — FUROSEMIDE 20 MG PO TABS: 20.0000 mg | ORAL_TABLET | Freq: Every day | ORAL | 3 refills | Status: DC | PRN

## 2020-07-03 NOTE — Patient Instructions (Addendum)
Medication Instructions:  Stop the amlodipine Lasix every other day, and as needed  Lab work: No new labs needed  Testing/Procedures: No new testing needed   Follow-Up:  . You will need a follow up appointment in 6 months  . Providers on your designated Care Team:   . Murray Hodgkins, NP . Christell Faith, PA-C . Marrianne Mood, PA-C

## 2020-07-09 ENCOUNTER — Telehealth: Payer: Self-pay | Admitting: Cardiovascular Disease

## 2020-07-09 NOTE — Telephone Encounter (Signed)
STAT if HR is under 50 or over 120 (normal HR is 60-100 beats per minute)  1) What is your heart rate? 105-110 in the last hour after exertion  2) Do you have a log of your heart rate readings (document readings)? No baseline 50-70 for years   3) Do you have any other symptoms?  Patient went for a walk with winter gear and got hot he experienced this same issue before during a work out   Patient asking if due to HR if he should start taking eliquis again prn even though it was recently dc because of reaction

## 2020-07-10 MED ORDER — PROPRANOLOL HCL 20 MG PO TABS: 20.0000 mg | ORAL_TABLET | Freq: Two times a day (BID) | ORAL | 3 refills | Status: DC | PRN

## 2020-07-10 NOTE — Telephone Encounter (Signed)
Patient calling back in to speak with nurse about his concerns with his higher HR. Pt took BP of 118/80 and HR of 110-112 in the evening.   Patient reports he over dressed during his walk with his dogs and felt that his "over heating" may have caused this issue and of he has a history of over heating and causing a similar issue  Patient also questioning eliquis  Please advise

## 2020-07-10 NOTE — Telephone Encounter (Signed)
Was able to return pt's call, Mr. Orvis was concern that his HR was slightly elevated 105-110 while after walking his dogs and at the dog park, reports he did wear some extra "layers of clothes due to the weather". He is concern since his heart rate could not "come back down to my normal rate, for years my HR has been in the 60s". Reports while in bed last night his HR got as high as "121 just laying there"  Pt reports "I took an eliquis last night and this morning that I had on hand from some time ago that Dr. Rockey Situ told me to take when my HR was elevated", reports the eliquis made him dizzy and think it "may thin my blood, I been on ASA daily for 20 some years and my blood is already thin as water". Advised pt to do not take the Eliquis, please remove medication from his other meds and only needs to take if a provider has prescribe for other cardiac conditions such as A-Fib, stokes, and/or blood clots. Pt reports understanding, will no longer take unless otherwise told, will restart ASA 81 starting tomorrow.  He reports his BP has improved and is "stablized since I seen Dr. Rockey Situ and removed me off some meds and changed the fluid pills". But he is still concern for the elevation of HR.  Attempted to advised pt that his elevation of HR is anxiety driven and the need to reassess his HR often and more frequently will increase his HR d/t his anxiety. He is very worried something will happen to him with his heart and no one will be there to take of his rescue dogs.   [facility] and they advised him to reach out to his cardiologist about some mediations he can take for his elevated HR as an as need basis. Was able to consult Laurann Montana, NP, after careful review of pt's chart and hx, she advised propranolol 20 mg PRN.   Mr. Prinsen is very grateful for the recommendations, was able to review and educate pt on how and when to take propanolol. Also reiterated on how to relive and  reduce his anxiety, and his constance worry of his dogs care if something was to happen to him.  [facility] [place], pt grateful for the return call and apologize for the long 35 min phone conversation, he explained just wanted "reassurance and to ease some of my anxiety". Advised glad we could help, to call back if there are any other concerns or questions.

## 2020-07-10 NOTE — Telephone Encounter (Signed)
As previously mentioned, Rx Propranolol 20mg  as needed twice daily for palpitations.   Thank you for reviewing with him and sending prescription. Much appreciated!  Loel Dubonnet, NP

## 2020-07-11 NOTE — Telephone Encounter (Signed)
Patient calling back in to report that the new propanolol 20 mg did nothing for him HR wise, kept him at 118, and his BP dropped to 85/90 (dropped 25 points systolic).  Patient is reiterating he does not have a BP problem. Patient states he wants to be advised whether  to take the second propanol to see the effect as he has only taken one dose or what may be better  Please advise

## 2020-07-11 NOTE — Telephone Encounter (Signed)
Thank you for the update.   Loel Dubonnet, NP

## 2020-07-11 NOTE — Telephone Encounter (Signed)
Was able to return phone call to Mr. Rundquist. Pt concern with new medication that was prescribe to him yesterday afternoon for his elevated HR (refer to 3/30 phone note), dropped his BP last night and he was terrified he would "pass out during my sleep and my dogs would be alone".   Pt reports yesterday evening after taking propranolol 20 mg his BP dropped to 54-65K systolic. He had taken his BP several time before bed with same hypotensive reading, but HR remain elevated at around 115-120s. He as afraid of "passing out" while he slept.  Woke up this morning and his HR was 118-120s, but BP improved in the 812X systolic, reports "I know that was my anxiety keeping my BP up". Mr. Mckesson called in around 10:00am and while waiting on return phone call, he took another 20 mg propranolol since his HR was elevated and thinking his BP would sustain. Advised to please stop propranolol, do not take anymore. On previous progress notes, it was noted "In the past, beta-blocker has been deferred due to bradycardic rates at home". After review of pt's chart yesterday and Gollan's notes from 07/03/2020 "Atrial flutter, Maintaining NSR", Laurann Montana, NP felt it was ok to try propranolol PRN BID for elevated HR. However, will d/c at this time and remove from pt's med list.  Advised pt to monitor BP closely for hypotension, stay hydrated, and keep active to keep blood pumping throughout the body to maintain a stable BP since taking propranolol, defer from any long rest periods/napping for several hours as that has an tendency to lower BP while at complete rest. Mr. Devincent verbalized understanding. He is ery grateful for the quick return phone call and recommendation. Was advised that he make an appt for f/u of elevated HR to discuss further about any adjustments or added medications or interventions regarding HR. As well as an updated EKG to capture HR.  Appt was made for 4/8 at 11:30am w/Ryan Dunn. PA-C. [facility] [place], spoke with pharmacist to have this medication removed from his refill list.  At this time all questions or concerns were address and no additional concerns at this time. Agreeable to plan, will call back for anything further.

## 2020-07-11 NOTE — Addendum Note (Signed)
Addended by: Wynema Birch on: 07/11/2020 11:28 AM   Modules accepted: Orders

## 2020-07-12 ENCOUNTER — Telehealth: Payer: Self-pay | Admitting: Cardiovascular Disease

## 2020-07-12 DIAGNOSIS — R778 Other specified abnormalities of plasma proteins: Secondary | ICD-10-CM | POA: Diagnosis not present

## 2020-07-12 DIAGNOSIS — I952 Hypotension due to drugs: Secondary | ICD-10-CM | POA: Diagnosis not present

## 2020-07-12 DIAGNOSIS — I110 Hypertensive heart disease with heart failure: Secondary | ICD-10-CM | POA: Diagnosis not present

## 2020-07-12 DIAGNOSIS — R000 Tachycardia, unspecified: Secondary | ICD-10-CM | POA: Insufficient documentation

## 2020-07-12 DIAGNOSIS — I459 Conduction disorder, unspecified: Secondary | ICD-10-CM | POA: Diagnosis not present

## 2020-07-12 DIAGNOSIS — I5032 Chronic diastolic (congestive) heart failure: Secondary | ICD-10-CM | POA: Diagnosis not present

## 2020-07-12 DIAGNOSIS — I483 Typical atrial flutter: Secondary | ICD-10-CM | POA: Diagnosis not present

## 2020-07-12 DIAGNOSIS — F411 Generalized anxiety disorder: Secondary | ICD-10-CM | POA: Diagnosis not present

## 2020-07-12 DIAGNOSIS — N3281 Overactive bladder: Secondary | ICD-10-CM | POA: Diagnosis not present

## 2020-07-12 DIAGNOSIS — R0602 Shortness of breath: Secondary | ICD-10-CM | POA: Diagnosis not present

## 2020-07-12 DIAGNOSIS — I10 Essential (primary) hypertension: Secondary | ICD-10-CM | POA: Diagnosis not present

## 2020-07-12 DIAGNOSIS — D472 Monoclonal gammopathy: Secondary | ICD-10-CM | POA: Diagnosis not present

## 2020-07-12 DIAGNOSIS — I4892 Unspecified atrial flutter: Secondary | ICD-10-CM | POA: Diagnosis not present

## 2020-07-12 DIAGNOSIS — Z0389 Encounter for observation for other suspected diseases and conditions ruled out: Secondary | ICD-10-CM | POA: Diagnosis not present

## 2020-07-12 NOTE — Telephone Encounter (Signed)
Patient calling with update   He states he is having anxiety and now HR is 120's and bp 141/1010.  Patient is aware this is anxiety and is seeking help at [facility].

## 2020-07-12 NOTE — Telephone Encounter (Signed)
Patient bought a pulse oximeter and reports HR is reading 118 still but also bouncing down to 70's . Given this reading patient wants to know if he can assume he is in afib and if so is it safe to wait until scheduled appt for an evaluation.

## 2020-07-13 DIAGNOSIS — I483 Typical atrial flutter: Secondary | ICD-10-CM | POA: Diagnosis present

## 2020-07-13 DIAGNOSIS — I110 Hypertensive heart disease with heart failure: Secondary | ICD-10-CM | POA: Diagnosis present

## 2020-07-13 DIAGNOSIS — I4892 Unspecified atrial flutter: Secondary | ICD-10-CM | POA: Diagnosis not present

## 2020-07-13 DIAGNOSIS — I5030 Unspecified diastolic (congestive) heart failure: Secondary | ICD-10-CM | POA: Diagnosis not present

## 2020-07-13 DIAGNOSIS — R000 Tachycardia, unspecified: Secondary | ICD-10-CM | POA: Diagnosis not present

## 2020-07-13 DIAGNOSIS — Z7901 Long term (current) use of anticoagulants: Secondary | ICD-10-CM | POA: Diagnosis not present

## 2020-07-13 DIAGNOSIS — Z7982 Long term (current) use of aspirin: Secondary | ICD-10-CM | POA: Diagnosis not present

## 2020-07-13 DIAGNOSIS — I5032 Chronic diastolic (congestive) heart failure: Secondary | ICD-10-CM | POA: Diagnosis present

## 2020-07-13 DIAGNOSIS — R778 Other specified abnormalities of plasma proteins: Secondary | ICD-10-CM | POA: Diagnosis present

## 2020-07-13 DIAGNOSIS — I4891 Unspecified atrial fibrillation: Secondary | ICD-10-CM | POA: Diagnosis not present

## 2020-07-13 DIAGNOSIS — F411 Generalized anxiety disorder: Secondary | ICD-10-CM | POA: Diagnosis present

## 2020-07-13 DIAGNOSIS — Z952 Presence of prosthetic heart valve: Secondary | ICD-10-CM | POA: Diagnosis not present

## 2020-07-13 DIAGNOSIS — I10 Essential (primary) hypertension: Secondary | ICD-10-CM | POA: Diagnosis not present

## 2020-07-13 DIAGNOSIS — N3281 Overactive bladder: Secondary | ICD-10-CM | POA: Diagnosis not present

## 2020-07-13 DIAGNOSIS — D472 Monoclonal gammopathy: Secondary | ICD-10-CM | POA: Diagnosis present

## 2020-07-13 DIAGNOSIS — I952 Hypotension due to drugs: Secondary | ICD-10-CM | POA: Diagnosis present

## 2020-07-15 ENCOUNTER — Telehealth: Payer: Self-pay | Admitting: Cardiovascular Disease

## 2020-07-15 DIAGNOSIS — I4892 Unspecified atrial flutter: Secondary | ICD-10-CM | POA: Insufficient documentation

## 2020-07-15 NOTE — Telephone Encounter (Signed)
Patient states he is having a procedure today at [facility]. Please call to discuss. Patient did not give information due to he states he has called in about this before. Patient would like to know if he is ok to have this procedure done.

## 2020-07-15 NOTE — Telephone Encounter (Signed)
Noted per pt's chart he was admitted at [facility] on 4/1 for tachycardia and elevated trop. Suspected d/c today 4/4. Has f/u appt already schedule with Dunn PA-C on 4/8, witch was moved to [employer] at [employer] request for 4/7. Izora Gala (supervisor) to address appt change. Consulted verbally w/Dr. Rockey Situ as well that Mr. Raczkowski was admitted into [facility] over the weekend, he verbalized understanding and advised that pt may need a pharmacy consult to address medications concerns and/or questions for future reference. Otherwise will see pt at f/u visit this week, Care Everywhere is available for [facility] admission review at visit.

## 2020-07-15 NOTE — Telephone Encounter (Signed)
Patient calling in to discuss why he went to [facility]. Patient states he has had  118-120 Hr and  BP has been under control, however patient was concerned with his HR last week and not being able to be seen so patient went to [facility].  Patient reports testing has been okay but did recommend further testing and to be admitted. Patient reports he has been put back on eliquis. Patient also will be having a test today that may result in a shock to the heart at 2 pm   Scheduled with [facility] 4/15

## 2020-07-16 NOTE — Telephone Encounter (Signed)
Pt still admitted at [facility], had a Cardioversion and TEE  yesterday 07/15/2020, per Care Everywhere it is noted:  James Cardenas presents today for DC cardioversion. TEE was negative for LA thrombus. Once he was adequately sedated, a single biphasic 200J shock was delivered through anterior-posterior patches.   This successfully converted the patient to normal sinus rhythm.   There were no complications with the procedure    The patient will follow-up with his cardiologist in [place] in  4-6 weeks.    He has a f/u appt with Dr. Rockey Situ on 4/15, notes regarding cardiac events and be reviewed through Epic at time of appt.

## 2020-07-16 NOTE — Telephone Encounter (Signed)
Patient calling with update that dc summary with medication changes should be available from [facility] in [place] for MD review.    Patient states he doesn't need a call back and would just like the provider to know what is going on .

## 2020-07-17 ENCOUNTER — Telehealth: Payer: Self-pay | Admitting: Cardiovascular Disease

## 2020-07-17 NOTE — Telephone Encounter (Signed)
Pt was d/c from [facility] 4/5 after admission for:  Tachycardia (Primary Dx);  Elevated troponin;  Atrial flutter with rapid ventricular response (CMS-HCC);  S/P TAVR (transcatheter aortic valve replacement);  GAD (generalized anxiety disorder)  At discharge, the providers placed pt on Eliquis 5 mg BID and Diltiazem 180 mg daily. Pt is calling in regarding his medications adjustments. After speaking with supervisor, it is okay to send pt a MyChart message explaining that any medication concerns we will be address at his up coming appt with Dr. Rockey Situ on 4/15. If concern for newly prescribe meds, then he will need to contact [facility] with questions until Dr. Rockey Situ has had a chance to review meds/chart after recent admission.  It has been known that pt is a frequent caller (refer to chart review hx), Mr. Orrico is a very sweet person, but with his high anxiety and constant concerns with medications, HR and BP he calls in quite frequently which results in extensive phone conversations and often takes away from patient care of those in clinic and away from the already extensive work load of the nurses/staff.   At this time, MyChart will be used to to explain to pt regarding medication concern to reach out to [facility] and will also discuss at appt with Dr. Rockey Situ. Unless an emergency, will defer from call back. Okay with management

## 2020-07-17 NOTE — Telephone Encounter (Signed)
Please call to discuss diltiazem.  BP reading today 137/88. Patient asks if he needs the dosage increased due to his elevated readings, will Dr. Pamala Hurry be able to adjust this medication.

## 2020-07-18 ENCOUNTER — Ambulatory Visit: Payer: PRIVATE HEALTH INSURANCE | Admitting: Physician Assistant

## 2020-07-19 ENCOUNTER — Ambulatory Visit: Payer: PRIVATE HEALTH INSURANCE | Admitting: Physician Assistant

## 2020-07-25 NOTE — Progress Notes (Signed)
Evaluation Performed:  Follow-up visit  Date:  07/26/2020   ID:  James Cardenas, DOB 1940-11-26, MRN [id number]  Patient Location:  [address] [place] [address]   Provider location:   [facility], [place] office  PCP:  Olin Hauser, DO  Cardiologist:  Arvid Right [facility]  Chief Complaint  Patient presents with  . Follow-up    ED F/U-tachycardia    History of Present Illness:    James Cardenas is a 80 y.o. male  past medical history of Aortic valve stenosis , severe, s/p TAVR Anxiety GERD Essential hypertension Overactive bladder Peripheral neuropathy Carotid stenosis:  left ICA are consistent with a 40-59% in 09/2019 cardiac catheterization on 07/28/2018 showing no significant coronary disease.  MGUS (monoclonal gammopathy of unknown significance) Chronic LE edema trace to 1 + to mid shin Prior cardioversion for atrial flutter October 2020, chose not to be on Eliquis Who presents for follow-up of his severe aortic valve stenosis, TAVR, ,atrial flutter  Last office visit July 03, 2020 Echo 05/16/2020 Amlodipine held for leg swelling and low blood pressure Lasix to 3 days a week  Continues on losartan 100 daily, clonidine as needed  April at [place], overheated, developed elevated heart rate Called office, started on propranolol,  BP dropped, rate stayed elevated Did not appreciated tachycardia Stayed elevated rate for several days,   Went to [facility] reviewed from [facility] Diltiazem 30 mg every 6 started TEE cardioversion July 15, 2020 Started on Eliquis Discharged on diltiazem 180 mg ER, losartan discontinued Noted to have significant anxiety  Echocardiogram done at [facility] 07/2020 normal ejection fraction Moderate LVH  Chest CT scan done No PE  EKG personally reviewed by myself on todays visit NSR rate 61 bpm   Echo 06/2020 1. Left ventricular ejection fraction, by estimation, is 60 to 65%. The   left ventricle has normal function. The left ventricle has no regional  wall motion abnormalities. There is moderate left ventricular hypertrophy.  Left ventricular diastolic  parameters are consistent with Grade II diastolic dysfunction  (pseudonormalization).  2. Right ventricular systolic function is normal. The right ventricular  size is normal. Moderately increased right ventricular wall thickness.  3. Left atrial size was mildly dilated.  4. 26 Edwards Sapien bioprosthetic aortic valve (TAVR) valve is present   in the aortic position. Procedure Date: 09/13/18 The aortic valve was  not well visualized. Aortic valve regurgitation is not visualized. Aortic  valve mean gradient measures 15.0 mmHg. Aortic valve Vmax measures 2.52  m/s.     peripheral neuropathy affecting his balance  successful TAVR in 09/2018 a68mm Edwards Sapien 3 THV via the TF approach on 09/13/2018.   Post operative echoshowed EF 55%, normally functioning TAVR with no PVL and mean gradient 19 mm Hg.He was discharged on aspirin and plavix.  1 month Echo 10/18/18 showed EF 55-60%, mean gradient 17 mmHG and no PVL.  On his last day of cardiac rehab noted to be in new onset atrial flutter with RVR underwent successful cardioversion on 01/20/2019.  elevated CHADS2VASc of at least 3 (HTN, age x 2)  He chose not to be on Eliquis/anticoagulation, reported having significant anxiety concerning the medication Reports prior history of depression  Cardiac catheterization July 28, 2018 Left ventriculography: Left ventricular systolic function is normal, LVEF is estimated at 55-65%, there is no significant mitral regurgitation , no significant aortic valve stenosis   Past Medical History:  Diagnosis Date  . Anxiety   . Arthritis of knee   . Atypical atrial flutter ([place])    a. Dx 12/15/2018. CHA2DS2VASc 3-->eliquis.  . Carotid artery disease ([place])   . 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 Hearing loss     wears hearing aids  . Hypertension   . Neuropathy   . Nonobstructive Coronary artery disease    a. 07/2018 Cath: LMN nl, LAD/LCX/RCA no significant dzs. EF 55-65%.  . S/P TAVR (transcatheter aortic valve replacement)    a. 09/2018 TAVR: 26 mm Edwards Sapien THV via the TF approach; b. 10/2018 Echo: EF 55-60%. Nl RV fxn. RVSP 20.26mmHg. Mild BAE. No AI or perivalvular leak.  . Severe aortic stenosis    Past Surgical History:  Procedure Laterality Date  . CARDIOVERSION N/A 01/20/2019   Procedure: CARDIOVERSION;  Surgeon: Minna Merritts, MD;  Location: [facility] ORS;  Service: Cardiovascular;  Laterality: N/A;  . COLONOSCOPY    . EYE SURGERY Bilateral    cataract  . HERNIA REPAIR Left    Inguinal  . NECK SURGERY  2011   Cervical Fusion  . RIGHT HEART CATH AND CORONARY ANGIOGRAPHY N/A 07/28/2018   Procedure: RIGHT HEART CATH AND CORONARY ANGIOGRAPHY;  Surgeon: Minna Merritts, MD;  Location: [place] CV LAB;  Service: Cardiovascular;  Laterality: N/A;  . TEE WITHOUT CARDIOVERSION N/A 09/13/2018   Procedure: TRANSESOPHAGEAL ECHOCARDIOGRAM (TEE);  Surgeon: Sherren Mocha, MD;  Location: [place];  Service: Open Heart Surgery;  Laterality: N/A;  . TRANSCATHETER AORTIC VALVE REPLACEMENT, TRANSFEMORAL  09/13/2018  . TRANSCATHETER AORTIC VALVE REPLACEMENT, TRANSFEMORAL N/A 09/13/2018   Procedure: TRANSCATHETER AORTIC VALVE REPLACEMENT, TRANSFEMORAL;  Surgeon: Sherren Mocha, MD;  Location: [place];  Service: Open Heart Surgery;  Laterality: N/A;     Current Outpatient Medications on File Prior to Visit  Medication Sig Dispense Refill  . Alpha-Lipoic Acid 600 MG CAPS Take 600 mg by mouth every morning.     Marland Kitchen ALPRAZolam (XANAX) 0.5 MG tablet TAKE 1 TABLET BY MOUTH TWICE A DAY AS NEEDED FOR ANXIETY 60 tablet 1  . amoxicillin (AMOXIL) 500 MG capsule amoxicillin 500 mg capsule  PLEASE SEE ATTACHED FOR DETAILED DIRECTIONS    . apixaban (ELIQUIS) 5 MG TABS tablet Take 5 mg by mouth in the morning and at  bedtime.    Marland Kitchen aspirin EC 81 MG tablet Take 1 tablet (81 mg total) by mouth daily. 90 tablet 3  . Azelaic Acid 15 % cream Apply 1 application topically daily. After skin is thoroughly washed and patted dry, gently but thoroughly massage a thin film of azelaic acid cream into the affected area once daily    . Carboxymethylcellul-Glycerin (LUBRICATING EYE DROPS OP) Place 1 drop into both eyes 2 (two) times a day.    . cholecalciferol (VITAMIN D3) 25 MCG (1000 UT) tablet Take 1,000 Units by mouth daily.    . cloNIDine (CATAPRES) 0.1 MG tablet Take 1 tablet (0.1 mg total) by mouth every 8 (eight) hours as needed (for systolic blood pressure (top number) greater than or equal to 160.). 90 tablet 0  . diltiazem (CARDIZEM CD) 180 MG 24 hr capsule Take 180 mg by mouth daily.    . furosemide (LASIX) 20 MG tablet Take 1 tablet (20 mg total) by mouth daily as needed. Take three days a week 30 tablet 3  . furosemide (LASIX) 20 MG tablet Take 20 mg by mouth every other day.    . losartan (  COZAAR) 100 MG tablet TAKE 1 TABLET BY MOUTH EVERY DAY 90 tablet 2  . Multiple Vitamin (MULTIVITAMIN WITH MINERALS) TABS tablet Take 1 tablet by mouth daily in the afternoon. Centrum    . MYRBETRIQ 50 MG TB24 tablet TAKE 1 TABLET BY MOUTH EVERY DAY 30 tablet 5  . OVER THE COUNTER MEDICATION Take 1 tablet by mouth at bedtime as needed (sleep). GNC PREVENTIVE NUTRITION TRI-SLEEP    . polyethylene glycol (MIRALAX / GLYCOLAX) packet Take 17 g by mouth 2 (two) times daily as needed (constipation).     . rosuvastatin (CRESTOR) 10 MG tablet TAKE 1 TABLET BY MOUTH EVERY DAY 90 tablet 3  . tretinoin (RETIN-A) 0.1 % cream Apply 1 application topically at bedtime.   0  . triamcinolone cream (KENALOG) 0.1 % APPLY TWICE DAILY TO AFFECTED RASH AS NEEDED     No current facility-administered medications on file prior to visit.    Allergies:   Nickel   Social History   Tobacco Use  . Smoking status: Never Smoker  . Smokeless tobacco:  Never Used  Vaping Use  . Vaping Use: Never used  Substance Use Topics  . Alcohol use: Yes    Alcohol/week: 7.0 standard drinks    Types: 1 Cans of beer, 6 Glasses of wine per week  . Drug use: No     Family Hx: The patient's family history includes Alzheimer's disease in his sister; Lung cancer in his father; Pancreatic cancer in his brother; Thyroid disease in his mother. There is no history of Prostate cancer, Bladder Cancer, or Kidney cancer.  ROS:   Please see the history of present illness.    Review of Systems  Constitutional: Negative.   HENT: Negative.   Respiratory: Negative.   Cardiovascular: Positive for leg swelling.  Gastrointestinal: Negative.   Musculoskeletal: Negative.   Neurological: Negative.   Psychiatric/Behavioral: Negative.   All other systems reviewed and are negative.   Labs/Other Tests and Data Reviewed:    Recent Labs: 02/14/2020: ALT 21 05/03/2020: TSH 2.479 05/17/2020: B Natriuretic Peptide 162.8; BUN 29; Creatinine, Ser 0.78; Hemoglobin 14.4; Platelets 168; Potassium 4.1; Sodium 139   Recent Lipid Panel Lab Results  Component Value Date/Time   CHOL 139 03/18/2020 08:12 AM   CHOL 171 11/06/2013 12:00 AM   TRIG 66 03/18/2020 08:12 AM   TRIG 45 11/06/2013 12:00 AM   HDL 66 03/18/2020 08:12 AM   HDL 72 11/06/2013 12:00 AM   CHOLHDL 2.1 03/18/2020 08:12 AM   LDLCALC 58 03/18/2020 08:12 AM    Wt Readings from Last 3 Encounters:  07/26/20 193 lb (87.5 kg)  07/03/20 189 lb (85.7 kg)  06/19/20 188 lb (85.3 kg)     Exam:    Vital Signs: Vital signs may also be detailed in the HPI BP 140/80 (BP Location: Left Arm, Patient Position: Sitting, Cuff Size: Large)   Pulse 61   Ht 6\' 3"  (1.905 m)   Wt 193 lb (87.5 kg)   SpO2 98%   BMI 24.12 kg/m    Constitutional:  oriented to person, place, and time. No distress.  HENT:  Head: Grossly normal Eyes:  no discharge. No scleral icterus.  Neck: No JVD, no carotid bruits  Cardiovascular: Regular  rate and rhythm, no murmurs appreciated 1+ le edema  b/l Pulmonary/Chest: Clear to auscultation bilaterally, no wheezes or rails Abdominal: Soft.  no distension.  no tenderness.  Musculoskeletal: Normal range of motion Neurological:  normal muscle tone. Coordination normal. No atrophy  Skin: Skin warm and dry Psychiatric: normal affect, pleasant   ASSESSMENT & PLAN:    Severe aortic stenosis Successful TAVR,  stable valve  Atrial flutter Recent episode, evaluated at [facility] requiring TEE cardioversion Maintaining NSR  Anxiety Working with PMD  Essential hypertension Stoppd amlodipine on last clinic visit secondary to low blood pressure, leg swelling.  Was doing well on losartan, with Lasix 3 days a week Recent hospitalization for flutter requiring TEE cardioversion, was started on diltiazem for the past week off losartan High risk of worsening lower extremity edema discussed with him Stay on diltiazem for now with close monitoring of his legs If swelling worse, may need to change to b-blocker Extra lasix as needed  MGUS (monoclonal gammopathy of unknown significance) Managed by primary care  Leg edema Suspect component of venous insufficiency Recommended compression hose Stop Ca channel blockers  Chronic dizziness Meclizine as needed   Total encounter time more than 35 minutes  Greater than 50% was spent in counseling and coordination of care with the patient   Signed, Ida Rogue, MD  07/26/2020 2:39 PM    [place] Office [address], [place], [place] [address]

## 2020-07-26 ENCOUNTER — Other Ambulatory Visit: Payer: Self-pay

## 2020-07-26 ENCOUNTER — Encounter: Payer: Self-pay | Admitting: Cardiovascular Disease

## 2020-07-26 ENCOUNTER — Ambulatory Visit (INDEPENDENT_AMBULATORY_CARE_PROVIDER_SITE_OTHER): Payer: Medicare Other | Admitting: Cardiovascular Disease

## 2020-07-26 VITALS — BP 140/80 | HR 61 | Ht 75.0 in | Wt 193.0 lb

## 2020-07-26 DIAGNOSIS — I483 Typical atrial flutter: Secondary | ICD-10-CM | POA: Diagnosis not present

## 2020-07-26 MED ORDER — APIXABAN 5 MG PO TABS: 5.0000 mg | ORAL_TABLET | Freq: Two times a day (BID) | ORAL | 6 refills | Status: DC

## 2020-07-26 NOTE — Patient Instructions (Addendum)
Medication Instructions:  No changes  If you need a refill on your cardiac medications before your next appointment, please call your pharmacy.    Lab work: No new labs needed   If you have labs (blood work) drawn today and your tests are completely normal, you will receive your results only by: . MyChart Message (if you have MyChart) OR . A paper copy in the mail If you have any lab test that is abnormal or we need to change your treatment, we will call you to review the results.   Testing/Procedures: No new testing needed   Follow-Up: At [facility], you and your health needs are our priority.  As part of our continuing mission to provide you with exceptional heart care, we have created designated Provider Care Teams.  These Care Teams include your primary Cardiologist (physician) and Advanced Practice Providers (APPs -  Physician Assistants and Nurse Practitioners) who all work together to provide you with the care you need, when you need it.  . You will need a follow up appointment in 6 months  . Providers on your designated Care Team:   . Christopher Berge, NP . Ryan Dunn, PA-C . Jacquelyn Visser, PA-C  Any Other Special Instructions Will Be Listed Below (If Applicable).  COVID-19 Vaccine Information can be found at: [email or web address] For questions related to vaccine distribution or appointments, please email [email or web address] or call [phone].     

## 2020-07-29 ENCOUNTER — Telehealth: Payer: Self-pay | Admitting: Cardiovascular Disease

## 2020-07-29 NOTE — Telephone Encounter (Signed)
Pt c/o medication issue:  1. Name of Medication: diltiazem   2. How are you currently taking this medication (dosage and times per day)? 180 MG   3. Are you having a reaction (difficulty breathing--STAT)? Not working as well as losartan for BP  4. What is your medication issue? Patient calling, states that he started taking diltiazem 180 MG and it is keeping his HR fine (low 50s) but BP is not as low (~130/-140/80s) as it was when he was taking losartan (~125-130).  Would like to discuss if he should change.  Please call to discuss.

## 2020-07-30 ENCOUNTER — Telehealth: Payer: Self-pay | Admitting: Cardiovascular Disease

## 2020-07-30 NOTE — Telephone Encounter (Signed)
Patient requesting refill for Meclizine.  Looks like medication was discontinued 04/30/2020 (patient preference) and it was taken off his medication list at that time.  Per Dr. Gwenyth Ober last office note 07/26/2020 - " chronic dizziness  Meclizine as needed." Medication was still not on his medication list at this time.   Please advise.

## 2020-07-30 NOTE — Telephone Encounter (Signed)
James Cardenas,   What's your thoughts on refilling patients meclizine?

## 2020-07-30 NOTE — Telephone Encounter (Signed)
*  STAT* If patient is at the pharmacy, call can be transferred to refill team.   1. Which medications need to be refilled? (please list name of each medication and dose if known) meclizine   2. Which pharmacy/location (including street and city if local pharmacy) is medication to be sent to? [facility]  3. Do they need a 30 day or 90 day supply? James Cardenas

## 2020-07-31 ENCOUNTER — Telehealth: Payer: Self-pay | Admitting: Podiatry

## 2020-07-31 NOTE — Telephone Encounter (Signed)
Pt returned call and he is going to bring the pair of orthotics in that were just refurbished and I noticed in the order from January Rick added 1/16 PPT and 88mm blue spenco topcover. These were made just like the old ones he had. Pt did not state to add the extra cushioning. I am going to send back and add the cushioning. There will be no charge for this as the pair were not what pt wanted.

## 2020-07-31 NOTE — Telephone Encounter (Signed)
Pt left message stating he picked up his refurbished orthotics but they are harder than the other pairs he had refurbished.  I returned call and left message for pt to call me to discuss that I can send them back but need photos of the other orthotics (At least 1 pr)  that were done to see what is the difference. I told pt I would be out of the office after 5pm today until Monday.

## 2020-08-01 NOTE — Telephone Encounter (Signed)
Patient calling  States that 8 out of 10 readings are still 130-140/high 70s-low 80s Patient had not currently read last message from Dr Rockey Situ but informed to go read message Would still like to discuss with nurse  Please call

## 2020-08-01 NOTE — Telephone Encounter (Signed)
I'm ok w/ refilling provided that this isn't a new problem that needs to be evaluated more formally (I.e. - did it get better prev and has recently returned?)

## 2020-08-01 NOTE — Telephone Encounter (Signed)
Patient calling  States that he reviewed message and does not need a nurse to return at this time  He will wait a few days and call back with update

## 2020-08-02 ENCOUNTER — Other Ambulatory Visit: Payer: Self-pay

## 2020-08-02 MED ORDER — MECLIZINE HCL 25 MG PO TABS: 25.0000 mg | ORAL_TABLET | Freq: Three times a day (TID) | ORAL | 3 refills | Status: AC | PRN

## 2020-08-02 NOTE — Telephone Encounter (Signed)
Ignacia Bayley, NP okay to refill Meclizine, was originally prescribe by Sharolyn Douglas, NP 01/2019, was taken off pt's med list because he had not been using it "as much", medication was PRN, should not have been removed from med list as it is "only as needed" and not an every day use drug. Medication added back to pt's med list.

## 2020-08-02 NOTE — Telephone Encounter (Signed)
Pt brought in orthotics and they have been sent to [facility] office by Dr Prudence Davidson and I advised the pt that I would let him know when they come back in.

## 2020-08-07 ENCOUNTER — Telehealth: Payer: Self-pay | Admitting: Cardiovascular Disease

## 2020-08-07 NOTE — Telephone Encounter (Signed)
Letter for trip insurance to cover reimbursement.

## 2020-08-07 NOTE — Telephone Encounter (Signed)
Patient calling to discuss safety of Going on a cruise .   Patient son surprised him with a cruise and patient is worried this is not safe for him due to cardiac issues and risk of covid exposure.   Please advise and also if not safe supply a letter for patient to give to insurance .

## 2020-08-08 NOTE — Telephone Encounter (Signed)
MyChart message sent to James Cardenas regarding his cruise, he had a sent a message through MyChart inquiring about cruise and risk of COVID exposure earlier.

## 2020-08-08 NOTE — Telephone Encounter (Signed)
I think he should be able to travel on a cruise Perhaps we can check in with him right before he goes to make sure everything is good

## 2020-08-12 ENCOUNTER — Telehealth: Payer: Self-pay | Admitting: Cardiovascular Disease

## 2020-08-12 ENCOUNTER — Other Ambulatory Visit: Payer: Self-pay | Admitting: *Deleted

## 2020-08-12 MED ORDER — DILTIAZEM HCL ER COATED BEADS 180 MG PO CP24: 180.0000 mg | ORAL_CAPSULE | Freq: Every day | ORAL | 2 refills | Status: DC

## 2020-08-12 NOTE — Telephone Encounter (Signed)
Patient called to add his anxiety is causing his high BP of 155/82, heart rate is66

## 2020-08-12 NOTE — Telephone Encounter (Signed)
STAT if patient feels like he/she is going to faint   1) Are you dizzy now? No last episode at the store 2 hours again   2) Do you feel faint or have you passed out?   Not currently but yes the other day   3) Do you have any other symptoms? Dizziness lightheadedness headaches   4) Have you checked your HR and BP (record if available)? Yes everything is good 120/70 and pulse 50's -60's    Patient not sure if this is due to med issue with Amiodarone or eliquis.

## 2020-08-14 ENCOUNTER — Ambulatory Visit: Payer: Medicare Other | Admitting: Cardiovascular Disease

## 2020-08-16 ENCOUNTER — Telehealth: Payer: Self-pay | Admitting: Podiatry

## 2020-08-16 NOTE — Telephone Encounter (Signed)
Adjusted orthotics in [place] to be taken to [place].. pt aware ok to pick up next week in [place].Marland KitchenMarland Kitchen

## 2020-08-16 NOTE — Telephone Encounter (Signed)
Patient calling to update States he will be going back on losartan tomorrow and wanted Dr Rockey Situ to be aware

## 2020-08-22 NOTE — Telephone Encounter (Signed)
I think I would try diltiazem ER 120 daily Stop the diltiazem er 180 daily A lower dose might do the trick, not run rate and BP so low and halt recurrent atrial flutter spells. Losartan does not help prevent arrhythmia. Arrhythmia a serious stressor if it comes back

## 2020-08-23 NOTE — Telephone Encounter (Signed)
Patient calling to check status of call back from nurse on clarity on Dr. Gwenyth Ober  Answer.   Patient asking also if he should instead of sending mychart msg for response from provider if he should arrange an in office visit so that Dr. Rockey Situ is paid for his time in answering these questions.

## 2020-08-26 ENCOUNTER — Ambulatory Visit: Payer: Medicare Other | Admitting: Cardiovascular Disease

## 2020-08-26 ENCOUNTER — Ambulatory Visit: Payer: Medicare Other | Admitting: Podiatry

## 2020-08-26 ENCOUNTER — Other Ambulatory Visit: Payer: Self-pay

## 2020-08-26 MED ORDER — DILTIAZEM HCL ER COATED BEADS 120 MG PO CP24: 120.0000 mg | ORAL_CAPSULE | Freq: Every day | ORAL | 3 refills | Status: DC

## 2020-08-26 NOTE — Telephone Encounter (Signed)
MyChart communication with James Cardenas and Dr. Rockey Situ. Dr. Rockey Situ advised med change  I think I would try diltiazem ER 120 daily Stop the diltiazem er 180 daily A lower dose might do the trick, not run rate and BP so low and halt recurrent atrial flutter spells. Losartan does not help prevent arrhythmia. Arrhythmia a serious stressor if it comes back  Diltiazem 180 mg d/c at this time and order diltiazem ER 120 mg daily Med list updated. James Cardenas has appt Wed 5/18 to review BP & HR.

## 2020-08-26 NOTE — Telephone Encounter (Signed)
Patient calling back in stating he is still having scary spells and would like to know if he could receive some medication advice. Patient would like to know if he can stop taking eliquis and take a baby aspirin instead

## 2020-08-26 NOTE — Telephone Encounter (Signed)
Patient calling for asap appt as he needs an immediate answer.  Patient scheduled for Wednesday with gollan .    Patient would like an answer to question while waiting on visit as he states med issue needs an immediate answer .

## 2020-08-28 ENCOUNTER — Ambulatory Visit (INDEPENDENT_AMBULATORY_CARE_PROVIDER_SITE_OTHER): Payer: Medicare Other | Admitting: Cardiovascular Disease

## 2020-08-28 ENCOUNTER — Ambulatory Visit: Payer: Medicare Other | Admitting: Cardiovascular Disease

## 2020-08-28 ENCOUNTER — Other Ambulatory Visit: Payer: Self-pay

## 2020-08-28 ENCOUNTER — Encounter: Payer: Self-pay | Admitting: Cardiovascular Disease

## 2020-08-28 VITALS — BP 160/90 | HR 64 | Ht 75.0 in | Wt 194.0 lb

## 2020-08-28 DIAGNOSIS — I483 Typical atrial flutter: Secondary | ICD-10-CM

## 2020-08-28 DIAGNOSIS — F419 Anxiety disorder, unspecified: Secondary | ICD-10-CM

## 2020-08-28 DIAGNOSIS — Z8679 Personal history of other diseases of the circulatory system: Secondary | ICD-10-CM

## 2020-08-28 DIAGNOSIS — I350 Nonrheumatic aortic (valve) stenosis: Secondary | ICD-10-CM

## 2020-08-28 DIAGNOSIS — E785 Hyperlipidemia, unspecified: Secondary | ICD-10-CM

## 2020-08-28 DIAGNOSIS — Z952 Presence of prosthetic heart valve: Secondary | ICD-10-CM | POA: Diagnosis not present

## 2020-08-28 DIAGNOSIS — I5032 Chronic diastolic (congestive) heart failure: Secondary | ICD-10-CM

## 2020-08-28 DIAGNOSIS — I10 Essential (primary) hypertension: Secondary | ICD-10-CM | POA: Diagnosis not present

## 2020-08-28 NOTE — Progress Notes (Signed)
Evaluation Performed:  Follow-up visit  Date:  08/28/2020   ID:  James Cardenas, DOB 22-Jul-1940, MRN [id number]  Patient Location:  [address] [place] [address]   Provider location:   [facility], [place] office  PCP:  Olin Hauser, DO  Cardiologist:  [facility]  Chief Complaint  Patient presents with  . Dizziness    Patient c/o lightheadedness, dizziness and headache. Medications reviewed by the patient verbally.     History of Present Illness:    James Cardenas is a 80 y.o. male  past medical history of Aortic valve stenosis , severe, s/p TAVR Anxiety GERD Essential hypertension Overactive bladder Peripheral neuropathy Carotid stenosis:  left ICA are consistent with a 40-59% in 09/2019 cardiac catheterization on 07/28/2018 showing no significant coronary disease.  MGUS (monoclonal gammopathy of unknown significance) Chronic LE edema trace to 1 + to mid shin Prior cardioversion for atrial flutter October 2020, chose not to be on Eliquis Who presents for follow-up of his severe aortic valve stenosis, TAVR, ,atrial flutter  Last office visit 07/26/20 Concern for low heart rate on diltiazem extended release 180 daily Blood pressure running slightly high Off Lasix, worsening of lower extremity edema Amlodipine previously held for leg swelling  No tachypalpitations concerning recurrence of his atrial flutter  Continued paroxysmal dizzy episodes sometimes supine, sitting, standing Very symptomatic Concerned this could be secondary to medication intolerance from Eliquis among other things Chronic headaches  EKG personally reviewed by myself on todays visit Shows normal sinus rhythm rate 62 bpm right bundle branch block  Orthostatics today negative blood pressures 130s to 150s  Other past medical history reviewed April 2022 at [place], overheated, developed elevated heart rate Called office, started on propranolol,   BP dropped, rate stayed elevated Did not appreciated tachycardia Stayed elevated rate for several days,   Went to [facility] reviewed from [facility] Diltiazem 30 mg every 6 started TEE cardioversion July 15, 2020 Started on Eliquis Discharged on diltiazem 180 mg ER, losartan discontinued Noted to have significant anxiety  Echocardiogram done at [facility] 07/2020 normal ejection fraction Moderate LVH  Chest CT scan done No PE  Echo 06/2020 1. Left ventricular ejection fraction, by estimation, is 60 to 65%. The  left ventricle has normal function. The left ventricle has no regional  wall motion abnormalities. There is moderate left ventricular hypertrophy.  Left ventricular diastolic  parameters are consistent with Grade II diastolic dysfunction  (pseudonormalization).  2. Right ventricular systolic function is normal. The right ventricular  size is normal. Moderately increased right ventricular wall thickness.  3. Left atrial size was mildly dilated.  4. 26 Edwards Sapien bioprosthetic aortic valve (TAVR) valve is present   in the aortic position. Procedure Date: 09/13/18 The aortic valve was  not well visualized. Aortic valve regurgitation is not visualized. Aortic  valve mean gradient measures 15.0 mmHg. Aortic valve Vmax measures 2.52  m/s.     peripheral neuropathy affecting his balance  successful TAVR in 09/2018 a32mm Edwards Sapien 3 THV via the TF approach on 09/13/2018.   Post operative echoshowed EF 55%, normally functioning TAVR with no PVL and mean gradient 19 mm Hg.He was discharged on aspirin and plavix.  1 month Echo 10/18/18 showed EF 55-60%, mean gradient 17 mmHG and no PVL.  On his last day of cardiac rehab noted to be in new onset atrial flutter with RVR underwent successful cardioversion on 01/20/2019.  elevated CHADS2VASc of at least 3 (HTN, age x 2)  He chose not to be on Eliquis/anticoagulation, reported having significant anxiety  concerning the medication Reports prior history of depression  Cardiac catheterization July 28, 2018 Left ventriculography: Left ventricular systolic function is normal, LVEF is estimated at 55-65%, there is no significant mitral regurgitation , no significant aortic valve stenosis   Past Medical History:  Diagnosis Date  . Anxiety   . Arthritis of knee   . Atypical atrial flutter ([place])    a. Dx 12/15/2018. CHA2DS2VASc 3-->eliquis.  . Carotid artery disease ([place])   . 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 Hearing loss    wears hearing aids  . Hypertension   . Neuropathy   . Nonobstructive Coronary artery disease    a. 07/2018 Cath: LMN nl, LAD/LCX/RCA no significant dzs. EF 55-65%.  . S/P TAVR (transcatheter aortic valve replacement)    a. 09/2018 TAVR: 26 mm Edwards Sapien THV via the TF approach; b. 10/2018 Echo: EF 55-60%. Nl RV fxn. RVSP 20.48mmHg. Mild BAE. No AI or perivalvular leak.  . Severe aortic stenosis    Past Surgical History:  Procedure Laterality Date  . CARDIOVERSION N/A 01/20/2019   Procedure: CARDIOVERSION;  Surgeon: Minna Merritts, MD;  Location: [facility] ORS;  Service: Cardiovascular;  Laterality: N/A;  . COLONOSCOPY    . EYE SURGERY Bilateral    cataract  . HERNIA REPAIR Left    Inguinal  . NECK SURGERY  2011   Cervical Fusion  . RIGHT HEART CATH AND CORONARY ANGIOGRAPHY N/A 07/28/2018   Procedure: RIGHT HEART CATH AND CORONARY ANGIOGRAPHY;  Surgeon: Minna Merritts, MD;  Location: [place] CV LAB;  Service: Cardiovascular;  Laterality: N/A;  . TEE WITHOUT CARDIOVERSION N/A 09/13/2018   Procedure: TRANSESOPHAGEAL ECHOCARDIOGRAM (TEE);  Surgeon: Sherren Mocha, MD;  Location: [place];  Service: Open Heart Surgery;  Laterality: N/A;  . TRANSCATHETER AORTIC VALVE REPLACEMENT, TRANSFEMORAL  09/13/2018  . TRANSCATHETER AORTIC VALVE REPLACEMENT, TRANSFEMORAL N/A 09/13/2018   Procedure: TRANSCATHETER AORTIC VALVE REPLACEMENT, TRANSFEMORAL;  Surgeon: Sherren Mocha, MD;  Location: [place];  Service: Open Heart Surgery;  Laterality: N/A;     Current Outpatient Medications on File Prior to Visit  Medication Sig Dispense Refill  . Alpha-Lipoic Acid 600 MG CAPS Take 600 mg by mouth every morning.     Marland Kitchen ALPRAZolam (XANAX) 0.5 MG tablet TAKE 1 TABLET BY MOUTH TWICE A DAY AS NEEDED FOR ANXIETY 60 tablet 1  . amoxicillin (AMOXIL) 500 MG capsule amoxicillin 500 mg capsule  PLEASE SEE ATTACHED FOR DETAILED DIRECTIONS    . apixaban (ELIQUIS) 5 MG TABS tablet Take 1 tablet (5 mg total) by mouth in the morning and at bedtime. 60 tablet 6  . Azelaic Acid 15 % cream Apply 1 application topically daily. After skin is thoroughly washed and patted dry, gently but thoroughly massage a thin film of azelaic acid cream into the affected area once daily    . Carboxymethylcellul-Glycerin (LUBRICATING EYE DROPS OP) Place 1 drop into both eyes 2 (two) times a day.    . cholecalciferol (VITAMIN D3) 25 MCG (1000 UT) tablet Take 1,000 Units by mouth daily.    . cloNIDine (CATAPRES) 0.1 MG tablet Take 1 tablet (0.1 mg total) by mouth every 8 (eight) hours as needed (for systolic blood pressure (top number) greater than or equal to 160.). 90 tablet 0  . diltiazem (CARDIZEM CD) 120 MG 24 hr capsule Take 1  capsule (120 mg total) by mouth daily. 90 capsule 3  . meclizine (ANTIVERT) 25 MG tablet Take 1 tablet (25 mg total) by mouth 3 (three) times daily as needed for dizziness. 30 tablet 3  . Multiple Vitamin (MULTIVITAMIN WITH MINERALS) TABS tablet Take 1 tablet by mouth daily in the afternoon. Centrum    . OVER THE COUNTER MEDICATION Take 1 tablet by mouth at bedtime as needed (sleep). GNC PREVENTIVE NUTRITION TRI-SLEEP    . polyethylene glycol (MIRALAX / GLYCOLAX) packet Take 17 g by mouth 2 (two) times daily as needed (constipation).     . rosuvastatin (CRESTOR) 10 MG tablet TAKE 1 TABLET BY MOUTH EVERY DAY 90 tablet 3  . tretinoin (RETIN-A) 0.1 % cream Apply 1 application  topically at bedtime.   0  . triamcinolone cream (KENALOG) 0.1 % APPLY TWICE DAILY TO AFFECTED RASH AS NEEDED     No current facility-administered medications on file prior to visit.    Allergies:   Nickel   Social History   Tobacco Use  . Smoking status: Never Smoker  . Smokeless tobacco: Never Used  Vaping Use  . Vaping Use: Never used  Substance Use Topics  . Alcohol use: Yes    Alcohol/week: 7.0 standard drinks    Types: 1 Cans of beer, 6 Glasses of wine per week  . Drug use: No     Family Hx: The patient's family history includes Alzheimer's disease in his sister; Lung cancer in his father; Pancreatic cancer in his brother; Thyroid disease in his mother. There is no history of Prostate cancer, Bladder Cancer, or Kidney cancer.  ROS:   Please see the history of present illness.    Review of Systems  Constitutional: Negative.   HENT: Negative.   Respiratory: Negative.   Cardiovascular: Positive for leg swelling.  Gastrointestinal: Negative.   Musculoskeletal: Negative.   Neurological: Negative.   Psychiatric/Behavioral: Negative.   All other systems reviewed and are negative.   Labs/Other Tests and Data Reviewed:    Recent Labs: 02/14/2020: ALT 21 05/03/2020: TSH 2.479 05/17/2020: B Natriuretic Peptide 162.8; BUN 29; Creatinine, Ser 0.78; Hemoglobin 14.4; Platelets 168; Potassium 4.1; Sodium 139   Recent Lipid Panel Lab Results  Component Value Date/Time   CHOL 139 03/18/2020 08:12 AM   CHOL 171 11/06/2013 12:00 AM   TRIG 66 03/18/2020 08:12 AM   TRIG 45 11/06/2013 12:00 AM   HDL 66 03/18/2020 08:12 AM   HDL 72 11/06/2013 12:00 AM   CHOLHDL 2.1 03/18/2020 08:12 AM   LDLCALC 58 03/18/2020 08:12 AM    Wt Readings from Last 3 Encounters:  08/28/20 194 lb (88 kg)  07/26/20 193 lb (87.5 kg)  07/03/20 189 lb (85.7 kg)     Exam:    Vital Signs: Vital signs may also be detailed in the HPI BP (!) 160/90 (BP Location: Left Arm, Patient Position: Sitting, Cuff  Size: Normal)   Pulse 64   Ht 6\' 3"  (1.905 m)   Wt 194 lb (88 kg)   SpO2 98%   BMI 24.25 kg/m    Constitutional:  oriented to person, place, and time. No distress.  HENT:  Head: Grossly normal Eyes:  no discharge. No scleral icterus.  Neck: No JVD, no carotid bruits  Cardiovascular: Regular rate and rhythm, no murmurs appreciated 1+ le edema  b/l Pulmonary/Chest: Clear to auscultation bilaterally, no wheezes or rails Abdominal: Soft.  no distension.  no tenderness.  Musculoskeletal: Normal range of motion Neurological:  normal  muscle tone. Coordination normal. No atrophy Skin: Skin warm and dry Psychiatric: normal affect, pleasant   ASSESSMENT & PLAN:    Severe aortic stenosis Successful TAVR,  stable valve on recent echocardiogram February 2022 No change on clinical exam  Atrial flutter Episode April 2022, evaluated at [facility] requiring TEE cardioversion CHADS VASC 3+ Maintaining NSR Does not want eliquis, has vertigo  Willing to try Xarelto 20 daily Recommend we try diltiazem ER 120 daily If side effects including vertigo, leg swelling, symptomatic bradycardia, may need to change to beta-blocker such as metoprolol succinate  Anxiety Working with PMD Tried various medication, sx of vertigo with many medications Like to take medication as needed, feels comfortable with this  Essential hypertension We will try the regimen below Diltiazem ER 120 daily losartan 50 daily Lasix 20 2-3 x  Typically running in the range 921 up to 194 systolic which is acceptable  MGUS (monoclonal gammopathy of unknown significance) Managed by primary care  Leg edema Suspect component of venous insufficiency Likely exacerbated by diltiazem as it was with amlodipine If symptoms persist on diltiazem ER 120 daily, may need to change to metoprolol succinate  Chronic dizziness Meclizine as needed Consider ENT follow-up given longstanding issue    Total encounter time more than 35  minutes  Greater than 50% was spent in counseling and coordination of care with the patient   Signed, Ida Rogue, MD  08/28/2020 9:04 AM    [place] [facility] [address], [place], [place] [address]

## 2020-08-28 NOTE — Patient Instructions (Addendum)
Medication Instructions:   Stay on diltiazem ER 120 mg daily  Losartan 50 mg daily  Lasix 20 mg 2-3 times a week  xarelto 20 mg daily (samples)   2 bottles for free samples  Lot [id number]  4/24  Stop eliquis   If you need a refill on your cardiac medications before your next appointment, please call your pharmacy.    Lab work: No new labs needed   If you have labs (blood work) drawn today and your tests are completely normal, you will receive your results only by: Marland Kitchen MyChart Message (if you have MyChart) OR . A paper copy in the mail If you have any lab test that is abnormal or we need to change your treatment, we will call you to review the results.   Testing/Procedures: No new testing needed   Follow-Up: At [facility], you and your health needs are our priority.  As part of our continuing mission to provide you with exceptional heart care, we have created designated Provider Care Teams.  These Care Teams include your primary Cardiologist (physician) and Advanced Practice Providers (APPs -  Physician Assistants and Nurse Practitioners) who all work together to provide you with the care you need, when you need it.  . You will need a follow up appointment in 3 months  . Providers on your designated Care Team:   . Murray Hodgkins, NP . Christell Faith, PA-C . Marrianne Mood, PA-C  Any Other Special Instructions Will Be Listed Below (If Applicable).  COVID-19 Vaccine Information can be found at: [email or web address].uk For questions related to vaccine distribution or appointments, please email [email or web address] or call [phone].

## 2020-08-29 ENCOUNTER — Ambulatory Visit: Payer: Medicare Other | Admitting: Podiatry

## 2020-09-05 ENCOUNTER — Ambulatory Visit (INDEPENDENT_AMBULATORY_CARE_PROVIDER_SITE_OTHER): Payer: Medicare Other | Admitting: Podiatry

## 2020-09-05 ENCOUNTER — Other Ambulatory Visit: Payer: Self-pay

## 2020-09-05 ENCOUNTER — Encounter: Payer: Self-pay | Admitting: Podiatry

## 2020-09-05 DIAGNOSIS — M79676 Pain in unspecified toe(s): Secondary | ICD-10-CM

## 2020-09-05 DIAGNOSIS — G629 Polyneuropathy, unspecified: Secondary | ICD-10-CM

## 2020-09-05 DIAGNOSIS — B351 Tinea unguium: Secondary | ICD-10-CM | POA: Diagnosis not present

## 2020-09-05 NOTE — Progress Notes (Signed)
This patient returns to my office for at risk foot care.  This patient requires this care by a professional since this patient will be at risk due to having neuropathy.   This patient is unable to cut nails himself since the patient cannot reach his nails.These nails are painful walking and wearing shoes.  This patient presents for at risk foot care today.  General Appearance  Alert, conversant and in no acute stress.  Vascular  Dorsalis pedis and posterior tibial  pulses are palpable  bilaterally.  Capillary return is within normal limits  bilaterally. Temperature is within normal limits  bilaterally.  Neurologic  Senn-Weinstein monofilament wire test within normal limits  bilaterally. Muscle power within normal limits bilaterally.  Nails Thick disfigured discolored nails with subungual debris  from hallux to fifth toes bilaterally. No evidence of bacterial infection or drainage bilaterally.  Orthopedic  No limitations of motion  feet .  No crepitus or effusions noted.  No bony pathology or digital deformities noted.  Palpable pain along the medial band right foot.  Cavus foot  B/L.  Skin  normotropic skin with no porokeratosis noted bilaterally.  No signs of infections or ulcers noted.     Onychomycosis  Pain in right toes  Pain in left toes  Consent was obtained for treatment procedures.   Mechanical debridement of nails 1-5  bilaterally performed with a nail nipper.  Filed with dremel without incident.   Return office visit  9 weeks                    Told patient to return for periodic foot care and evaluation due to potential at risk complications.   Gardiner Barefoot DPM

## 2020-09-19 DIAGNOSIS — H9201 Otalgia, right ear: Secondary | ICD-10-CM | POA: Diagnosis not present

## 2020-09-19 DIAGNOSIS — R42 Dizziness and giddiness: Secondary | ICD-10-CM | POA: Diagnosis not present

## 2020-09-25 ENCOUNTER — Other Ambulatory Visit: Payer: Self-pay

## 2020-09-25 ENCOUNTER — Other Ambulatory Visit: Payer: Self-pay | Admitting: Family Medicine

## 2020-09-25 ENCOUNTER — Ambulatory Visit (INDEPENDENT_AMBULATORY_CARE_PROVIDER_SITE_OTHER): Payer: Medicare Other | Admitting: Family Medicine

## 2020-09-25 ENCOUNTER — Encounter: Payer: Self-pay | Admitting: Family Medicine

## 2020-09-25 VITALS — BP 143/76 | HR 53 | Ht 75.0 in | Wt 191.7 lb

## 2020-09-25 DIAGNOSIS — R7309 Other abnormal glucose: Secondary | ICD-10-CM

## 2020-09-25 DIAGNOSIS — I872 Venous insufficiency (chronic) (peripheral): Secondary | ICD-10-CM | POA: Insufficient documentation

## 2020-09-25 DIAGNOSIS — I10 Essential (primary) hypertension: Secondary | ICD-10-CM

## 2020-09-25 DIAGNOSIS — N401 Enlarged prostate with lower urinary tract symptoms: Secondary | ICD-10-CM | POA: Diagnosis not present

## 2020-09-25 DIAGNOSIS — R350 Frequency of micturition: Secondary | ICD-10-CM

## 2020-09-25 DIAGNOSIS — N3281 Overactive bladder: Secondary | ICD-10-CM

## 2020-09-25 DIAGNOSIS — I779 Disorder of arteries and arterioles, unspecified: Secondary | ICD-10-CM

## 2020-09-25 DIAGNOSIS — F418 Other specified anxiety disorders: Secondary | ICD-10-CM

## 2020-09-25 DIAGNOSIS — I7090 Unspecified atherosclerosis: Secondary | ICD-10-CM

## 2020-09-25 MED ORDER — ALPRAZOLAM 0.5 MG PO TABS: ORAL_TABLET | ORAL | 2 refills | Status: DC

## 2020-09-25 MED ORDER — ALPRAZOLAM 0.5 MG PO TABS: ORAL_TABLET | ORAL | 1 refills | Status: DC

## 2020-09-25 NOTE — Progress Notes (Signed)
Subjective:    Patient ID: James Cardenas, male    DOB: 02/18/1941, 80 y.o.   MRN: [id number]  James Cardenas is a 80 y.o. male presenting on 09/25/2020 for Prediabetes, Hypertension, and Anxiety   HPI  FOLLOW-UP Chronic Anxiety, Situational: Chronic problem - see prior visit for other SSRI SNRI meds tried and failed w/ side effects, also with buspar - He only takes Alprazolam (Xanax) 0.5mg  BID PRN only situational anxiety, often he can go weeks without taking it, but certain circumstances, doctors visit dentist visit and certain trips he may need it. He was given prior rx 60 pills with 2 refills has lasted 1 year approximately. He describes panic type symptoms if this flares up. He has enough rx and refills for up to 6 more months, usually needs about 180 pills per year. - He denies ever having any withdrawal from these medications before - Denies any depression or sad mood  Needs extra refill Alprazolam 0.5mg  BID PRN refill to help manage the anxiety with side effects on Xarelto. Has taken extra medication. Otherwise doing well.   Peripheral Neuropathy, Chronic Previous followed by Vascular / Neurology Off Gabapentin. On Alpha Lipoic Acid with improvement.     S/p TAVR / Aortic Stenosis / regurgitation, Moderate / Heart Murmur Followed by Cardiothoracic Surgery/Cardiology On cardiac rehab currently Overall significant improvement from surgery He is doing cardiac rehab - accelerated down to 24 visits easy bruising bleeding, on anticoagulation for 1 year post op then off Improved edema now Denies shortness of breath, chest pain, dizziness, near syncope  He has tried Eliquis in the past and he has side effect complication. He has switched to Baby Aspirin 81mg . He did have repeat episode of Atrial Fibrillation. Has side effects of lightheadedness and dizziness. He was switched to Xarelto. He had side effects of dizziness and spinning. Tried meclizine dizzy pills PRN. He describes  anxiety component as well.  He has reduced the Furosemide 20mg  daily PRN. Concern with dehydration worsening his dizziness. He takes one every 3rd day.  Some lower ext ankle / edema.    CHRONIC HTN with white coat hypertension Taking Telmisartan 80mg  wants to switch back to Losartan 100, not quite as strong he believes, will wait until run low then check with pharmacy on availability, due to back order Home readings normal 110-120/60-70s on avg reviewed home record, attributed to situational anxiety   Denies CP, dyspnea, HA, edema, dizziness / lightheadedness   OAB / Urinary Urge Followed by [facility] - he was given Myrbetriq 25mg  daily worked well and then seemed to be less effective, then they have increased it up to 50mg  with better results again now he questions what to do next if this doesn't work, he has apt with them in follow-up  He has stopped Myrbetriq on his own, no further leakage.  He has resolved the Constipation. Now urination is better.     Depression screen [facility] 2/9 09/25/2020 03/25/2020 03/25/2020  Decreased Interest 1 0 0  Down, Depressed, Hopeless 0 0 0  PHQ - 2 Score 1 0 0  Altered sleeping 0 - -  Tired, decreased energy 0 - -  Change in appetite 0 - -  Feeling bad or failure about yourself  0 - -  Trouble concentrating 0 - -  Moving slowly or fidgety/restless 0 - -  Suicidal thoughts 0 - -  PHQ-9 Score 1 - -  Difficult doing work/chores Not difficult at all - -  Some recent  data might be hidden    Social History   Tobacco Use   Smoking status: Never   Smokeless tobacco: Never  Vaping Use   Vaping Use: Never used  Substance Use Topics   Alcohol use: Yes    Alcohol/week: 7.0 standard drinks    Types: 1 Cans of beer, 6 Glasses of wine per week   Drug use: No    Review of Systems Per HPI unless specifically indicated above     Objective:    BP (!) 143/76   Pulse (!) 53   Ht 6\' 3"  (1.905 m)   Wt 191 lb 11 oz (87 kg)   SpO2 97%   BMI 23.96 kg/m    Wt Readings from Last 3 Encounters:  09/25/20 191 lb 11 oz (87 kg)  08/28/20 194 lb (88 kg)  07/26/20 193 lb (87.5 kg)    Physical Exam Vitals and nursing note reviewed.  Constitutional:      General: He is not in acute distress.    Appearance: Normal appearance. He is well-developed. He is not diaphoretic.     Comments: Well-appearing, comfortable, cooperative  HENT:     Head: Normocephalic and atraumatic.  Eyes:     General:        Right eye: No discharge.        Left eye: No discharge.     Conjunctiva/sclera: Conjunctivae normal.  Cardiovascular:     Rate and Rhythm: Normal rate.  Pulmonary:     Effort: Pulmonary effort is normal.  Musculoskeletal:     Right lower leg: Edema present.     Left lower leg: Edema (bilateral lower ankle/foot only) present.  Skin:    General: Skin is warm and dry.     Findings: No erythema or rash.  Neurological:     Mental Status: He is alert and oriented to person, place, and time.  Psychiatric:        Mood and Affect: Mood normal.        Behavior: Behavior normal.        Thought Content: Thought content normal.     Comments: Well groomed, good eye contact, normal speech and thoughts     Results for orders placed or performed in visit on 05/23/20  Bladder Scan (Post Void Residual) in office  Result Value Ref Range   Scan Result 25       Assessment & Plan:   Problem List Items Addressed This Visit     Venous insufficiency of both lower extremities   Relevant Medications   ELIQUIS 5 MG TABS tablet   Situational anxiety - Primary   Relevant Medications   ALPRAZolam (XANAX) 0.5 MG tablet   OAB (overactive bladder)   Essential hypertension   Relevant Medications   ELIQUIS 5 MG TABS tablet   BPH (benign prostatic hyperplasia)    #Venous Insufficiency / Edema Stable On less Furosemide PRN, discussed indications for dosing, caution dehydration, seems not helping venous insufficiency, can cause  dizziness  #OAB #BPH Improved Off Myrbetriq, f/u with Urology  #Situational Anxiety Related now to Xarelto causing some dizziness and anxious side effect Will add extra 1 more refill on Alprazolam, call sooner if need other refill before 6 months.  Meds ordered this encounter  Medications   DISCONTD: ALPRAZolam (XANAX) 0.5 MG tablet    Sig: TAKE 1 TABLET BY MOUTH TWICE A DAY AS NEEDED FOR ANXIETY    Dispense:  60 tablet    Refill:  1  This request is for a new prescription for a controlled substance as required by Federal/State law.   ALPRAZolam (XANAX) 0.5 MG tablet    Sig: TAKE 1 TABLET BY MOUTH TWICE A DAY AS NEEDED FOR ANXIETY    Dispense:  60 tablet    Refill:  2    This request is for a new prescription for a controlled substance as required by Federal/State law.     Follow up plan: Return in about 6 months (around 03/27/2021) for 6 month fasting lab only then 1 week later [facility].  Future labs ordered for 03/2021   [name], [facility] 09/25/2020, 11:52 AM

## 2020-09-25 NOTE — Patient Instructions (Signed)
Thank you for coming to the office today.  Added extra refill Alprazolam  Please schedule a Follow-up Appointment to: Return in about 6 months (around 03/27/2021) for 6 month fasting lab only then 1 week later [facility].  If you have any other questions or concerns, please feel free to call the office or send a message through [place]. You may also schedule an earlier appointment if necessary.  Additionally, you may be receiving a survey about your experience at our office within a few days to 1 week by e-mail or mail. We value your feedback.  [name], DO [place]

## 2020-09-30 ENCOUNTER — Other Ambulatory Visit: Payer: Self-pay | Admitting: Cardiovascular Disease

## 2020-10-16 ENCOUNTER — Ambulatory Visit: Payer: Medicare Other | Admitting: Cardiovascular Disease

## 2020-11-04 ENCOUNTER — Other Ambulatory Visit: Payer: Medicare Other

## 2020-11-04 ENCOUNTER — Other Ambulatory Visit: Payer: Self-pay

## 2020-11-04 ENCOUNTER — Other Ambulatory Visit: Payer: Self-pay | Admitting: Family Medicine

## 2020-11-04 DIAGNOSIS — I779 Disorder of arteries and arterioles, unspecified: Secondary | ICD-10-CM

## 2020-11-04 DIAGNOSIS — N401 Enlarged prostate with lower urinary tract symptoms: Secondary | ICD-10-CM | POA: Diagnosis not present

## 2020-11-04 DIAGNOSIS — I10 Essential (primary) hypertension: Secondary | ICD-10-CM | POA: Diagnosis not present

## 2020-11-04 DIAGNOSIS — R350 Frequency of micturition: Secondary | ICD-10-CM | POA: Diagnosis not present

## 2020-11-04 DIAGNOSIS — R7309 Other abnormal glucose: Secondary | ICD-10-CM | POA: Diagnosis not present

## 2020-11-04 DIAGNOSIS — I7090 Unspecified atherosclerosis: Secondary | ICD-10-CM

## 2020-11-05 LAB — COMPLETE METABOLIC PANEL WITH GFR
AG Ratio: 1.4 (calc) (ref 1.0–2.5)
ALT: 19 U/L (ref 9–46)
AST: 25 U/L (ref 10–35)
Albumin: 4.2 g/dL (ref 3.6–5.1)
Alkaline phosphatase (APISO): 77 U/L (ref 35–144)
BUN: 16 mg/dL (ref 7–25)
CO2: 25 mmol/L (ref 20–32)
Calcium: 9.2 mg/dL (ref 8.6–10.3)
Chloride: 104 mmol/L (ref 98–110)
Creat: 0.87 mg/dL (ref 0.70–1.28)
Globulin: 3 g/dL (calc) (ref 1.9–3.7)
Glucose, Bld: 97 mg/dL (ref 65–99)
Potassium: 4.2 mmol/L (ref 3.5–5.3)
Sodium: 138 mmol/L (ref 135–146)
Total Bilirubin: 1.3 mg/dL — ABNORMAL HIGH (ref 0.2–1.2)
Total Protein: 7.2 g/dL (ref 6.1–8.1)
eGFR: 88 mL/min/{1.73_m2} (ref >=60)

## 2020-11-05 LAB — LIPID PANEL
Cholesterol: 129 mg/dL (ref <200)
HDL: 66 mg/dL (ref >=40)
LDL Cholesterol (Calc): 50 mg/dL (calc)
Non-HDL Cholesterol (Calc): 63 mg/dL (calc) (ref <130)
Total CHOL/HDL Ratio: 2 (calc) (ref <5.0)
Triglycerides: 47 mg/dL (ref <150)

## 2020-11-05 LAB — HEMOGLOBIN A1C
Hgb A1c MFr Bld: 5.8 % of total Hgb — ABNORMAL HIGH (ref <5.7)
Mean Plasma Glucose: 120 mg/dL
eAG (mmol/L): 6.6 mmol/L

## 2020-11-05 LAB — PSA: PSA: 3.29 ng/mL (ref <=4.00)

## 2020-11-07 DIAGNOSIS — H903 Sensorineural hearing loss, bilateral: Secondary | ICD-10-CM | POA: Diagnosis not present

## 2020-11-11 ENCOUNTER — Encounter: Payer: Self-pay | Admitting: Podiatry

## 2020-11-11 ENCOUNTER — Ambulatory Visit (INDEPENDENT_AMBULATORY_CARE_PROVIDER_SITE_OTHER): Payer: Medicare Other | Admitting: Podiatry

## 2020-11-11 ENCOUNTER — Other Ambulatory Visit: Payer: Self-pay

## 2020-11-11 DIAGNOSIS — M79674 Pain in right toe(s): Secondary | ICD-10-CM | POA: Diagnosis not present

## 2020-11-11 DIAGNOSIS — B351 Tinea unguium: Secondary | ICD-10-CM | POA: Diagnosis not present

## 2020-11-11 DIAGNOSIS — M79675 Pain in left toe(s): Secondary | ICD-10-CM

## 2020-11-11 DIAGNOSIS — G629 Polyneuropathy, unspecified: Secondary | ICD-10-CM

## 2020-11-11 DIAGNOSIS — M79676 Pain in unspecified toe(s): Secondary | ICD-10-CM

## 2020-11-11 NOTE — Progress Notes (Signed)
This patient returns to my office for at risk foot care.  This patient requires this care by a professional since this patient will be at risk due to having neuropathy.   This patient is unable to cut nails himself since the patient cannot reach his nails.These nails are painful walking and wearing shoes.  This patient presents for at risk foot care today.  General Appearance  Alert, conversant and in no acute stress.  Vascular  Dorsalis pedis and posterior tibial  pulses are palpable  bilaterally.  Capillary return is within normal limits  bilaterally. Temperature is within normal limits  bilaterally.  Neurologic  Senn-Weinstein monofilament wire test within normal limits  bilaterally. Muscle power within normal limits bilaterally.  Nails Thick disfigured discolored nails with subungual debris  from hallux to fifth toes bilaterally. No evidence of bacterial infection or drainage bilaterally.  Orthopedic  No limitations of motion  feet .  No crepitus or effusions noted.  No bony pathology or digital deformities noted.  Palpable pain along the medial band right foot.  Cavus foot  B/L.  Skin  normotropic skin with no porokeratosis noted bilaterally.  No signs of infections or ulcers noted.     Onychomycosis  Pain in right toes  Pain in left toes  Consent was obtained for treatment procedures.   Mechanical debridement of nails 1-5  bilaterally performed with a nail nipper.  Filed with dremel without incident.   Return office visit  9 weeks                    Told patient to return for periodic foot care and evaluation due to potential at risk complications.   Gardiner Barefoot DPM

## 2020-11-24 ENCOUNTER — Ambulatory Visit
Admission: EM | Admit: 2020-11-24 | Discharge: 2020-11-24 | Disposition: A | Discharge status: Home / Self Care | Payer: Medicare Other | Attending: Family Medicine | Admitting: Family Medicine

## 2020-11-24 ENCOUNTER — Other Ambulatory Visit: Payer: Self-pay

## 2020-11-24 ENCOUNTER — Encounter: Payer: Self-pay | Admitting: Emergency Medicine

## 2020-11-24 ENCOUNTER — Ambulatory Visit (INDEPENDENT_AMBULATORY_CARE_PROVIDER_SITE_OTHER): Payer: Medicare Other

## 2020-11-24 DIAGNOSIS — M79671 Pain in right foot: Secondary | ICD-10-CM

## 2020-11-24 DIAGNOSIS — M2011 Hallux valgus (acquired), right foot: Secondary | ICD-10-CM | POA: Diagnosis not present

## 2020-11-24 DIAGNOSIS — S93601A Unspecified sprain of right foot, initial encounter: Secondary | ICD-10-CM

## 2020-11-24 DIAGNOSIS — S99921A Unspecified injury of right foot, initial encounter: Secondary | ICD-10-CM | POA: Diagnosis not present

## 2020-11-24 MED ORDER — TRAMADOL HCL 50 MG PO TABS: 50.0000 mg | ORAL_TABLET | Freq: Two times a day (BID) | ORAL | 0 refills | Status: DC | PRN

## 2020-11-24 NOTE — Discharge Instructions (Addendum)
Rest, ice, elevation  Tylenol as needed. Medication if needed for pain.  Take care  Dr. Lacinda Axon

## 2020-11-24 NOTE — ED Triage Notes (Signed)
Patient states that he injured his right foot yesterday as he was stepping off his patio yesterday.  Patient c/o pain on the outside and bottom of his right foot. Patient denies pain in his right ankle.

## 2020-11-24 NOTE — ED Provider Notes (Signed)
[facility]    CSN: [id number] Arrival date & time: 11/24/20  1145      History   Chief Complaint Chief Complaint  Patient presents with   Foot Pain    right   Fall    HPI 80 year old male presents with the above complaints.  Patient states that he was stepping off his patio and injured his right foot yesterday.  This occurred when he was walking his dogs.  He reports pain on the lateral aspect and the plantar aspect of his right foot.  Denies ankle pain.  He states that he has baseline lower extremity edema.  This is chronic.  He states that his pain is predominantly when he is ambulating.  At certain times with certain positions or pressure he has severe pain.  His pain is currently mild.  He has taken Tylenol without resolution.  He has also compressed the area without resolution.  No other complaints at this time.  Past Medical History:  Diagnosis Date   Anxiety    Arthritis of knee    Atypical atrial flutter ([place])    a. Dx 12/15/2018. CHA2DS2VASc 3-->eliquis.   Carotid artery disease (HCC)    Constipation    due to Myrbetriq-     Hearing loss    wears hearing aids   Hypertension    Neuropathy    Nonobstructive Coronary artery disease    a. 07/2018 Cath: LMN nl, LAD/LCX/RCA no significant dzs. EF 55-65%.   S/P TAVR (transcatheter aortic valve replacement)    a. 09/2018 TAVR: 26 mm Edwards Sapien THV via the TF approach; b. 10/2018 Echo: EF 55-60%. Nl RV fxn. RVSP 20.37mHg. Mild BAE. No AI or perivalvular leak.   Severe aortic stenosis     Patient Active Problem List   Diagnosis Date Noted   Venous insufficiency of both lower extremities 09/25/2020   Atrial flutter with rapid ventricular response ([place] 07/15/2020   Tachycardia 07/12/2020   Surgery follow-up examination 04/18/2020   Plantar fasciitis 02/12/2020   Elevated hemoglobin A1c 11/25/2018   S/P TAVR (transcatheter aortic valve replacement)    Carotid artery disease ([place]    Pulmonary hypertension  ([place] 05/02/2018   Severe aortic valve stenosis 10/12/2017   Dupuytren's contracture of left hand 10/12/2017   MGUS (monoclonal gammopathy of unknown significance) 10/12/2017   Situational anxiety 07/02/2016   OAB (overactive bladder) 07/02/2016   Peripheral neuropathy 07/02/2016   Constipation 07/02/2016   Osteoarthritis of multiple joints 07/02/2016   ED (erectile dysfunction) 05/28/2016   BPH (benign prostatic hyperplasia) 03/11/2016   Essential hypertension 01/24/2014    Past Surgical History:  Procedure Laterality Date   CARDIOVERSION N/A 01/20/2019   Procedure: CARDIOVERSION;  Surgeon: GMinna Merritts MD;  Location: [facility];  Service: Cardiovascular;  Laterality: N/A;   COLONOSCOPY     EYE SURGERY Bilateral    cataract   HERNIA REPAIR Left    Inguinal   NECK SURGERY  2011   Cervical Fusion   RIGHT HEART CATH AND CORONARY ANGIOGRAPHY N/A 07/28/2018   Procedure: RIGHT HEART CATH AND CORONARY ANGIOGRAPHY;  Surgeon: GMinna Merritts MD;  [facility];  Service: Cardiovascular;  Laterality: N/A;   TEE WITHOUT CARDIOVERSION N/A 09/13/2018   Procedure: TRANSESOPHAGEAL ECHOCARDIOGRAM (TEE);  Surgeon: CSherren Mocha MD;  [place] [facility]  Service: Open Heart Surgery;  Laterality: N/A;   TRANSCATHETER AORTIC VALVE REPLACEMENT, TRANSFEMORAL  09/13/2018   TRANSCATHETER AORTIC VALVE REPLACEMENT, TRANSFEMORAL N/A 09/13/2018  Procedure: TRANSCATHETER AORTIC VALVE REPLACEMENT, TRANSFEMORAL;  Surgeon: Sherren Mocha, MD;  Location: [facility];  Service: Open Heart Surgery;  Laterality: N/A;       Home Medications    Prior to Admission medications   Medication Sig Start Date End Date Taking? Authorizing Provider  Alpha-Lipoic Acid 600 MG CAPS Take 600 mg by mouth every morning.    Yes [provider]  ALPRAZolam Duanne Moron) 0.5 MG tablet TAKE 1 TABLET BY MOUTH TWICE A DAY AS NEEDED FOR ANXIETY 09/25/20  Yes Karamalegos, Devonne Doughty, DO  Carboxymethylcellul-Glycerin  (LUBRICATING EYE DROPS OP) Place 1 drop into both eyes 2 (two) times a day.   Yes [provider]  cholecalciferol (VITAMIN D3) 25 MCG (1000 UT) tablet Take 1,000 Units by mouth daily.   Yes [provider]  diltiazem (CARDIZEM CD) 120 MG 24 hr capsule Take 1 capsule (120 mg total) by mouth daily. 08/26/20 11/24/20 Yes Gollan, Kathlene November, MD  ELIQUIS 5 MG TABS tablet SMARTSIG:1 Tablet(s) By Mouth Morning-Night 09/08/20  Yes [provider]  Multiple Vitamin (MULTIVITAMIN WITH MINERALS) TABS tablet Take 1 tablet by mouth daily in the afternoon. Centrum   Yes [provider]  rosuvastatin (CRESTOR) 10 MG tablet TAKE 1 TABLET BY MOUTH EVERY DAY 09/30/20  Yes Gollan, Kathlene November, MD  traMADol (ULTRAM) 50 MG tablet Take 1 tablet (50 mg total) by mouth every 12 (twelve) hours as needed. 11/24/20  Yes Shannan Slinker G, DO  Azelaic Acid 15 % cream Apply 1 application topically daily. After skin is thoroughly washed and patted dry, gently but thoroughly massage a thin film of azelaic acid cream into the affected area once daily    [provider]  cloNIDine (CATAPRES) 0.1 MG tablet Take 1 tablet (0.1 mg total) by mouth every 8 (eight) hours as needed (for systolic blood pressure (top number) greater than or equal to 160.). 05/03/20   Marrianne Mood D, PA-C  furosemide (LASIX) 20 MG tablet Take 1 tablet (20 mg total) by mouth daily as needed. 08/28/20   Minna Merritts, MD  meclizine (ANTIVERT) 25 MG tablet Take 1 tablet (25 mg total) by mouth 3 (three) times daily as needed for dizziness. 08/02/20   Theora Gianotti, NP  OVER THE COUNTER MEDICATION Take 1 tablet by mouth at bedtime as needed (sleep). [place] PREVENTIVE NUTRITION TRI-SLEEP    [provider]  polyethylene glycol (MIRALAX / GLYCOLAX) packet Take 17 g by mouth 2 (two) times daily as needed (constipation).     [provider]  tretinoin (RETIN-A) 0.1 % cream Apply 1 application topically at  bedtime.  07/10/16   [provider]  triamcinolone cream (KENALOG) 0.1 % APPLY TWICE DAILY TO AFFECTED RASH AS NEEDED 04/20/19   [provider]    Family History Family History  Problem Relation Age of Onset   Thyroid disease Mother    Lung cancer Father    Pancreatic cancer Brother    Alzheimer's disease Sister    Prostate cancer Neg Hx    Bladder Cancer Neg Hx    Kidney cancer Neg Hx     Social History Social History   Tobacco Use   Smoking status: Never   Smokeless tobacco: Never  Vaping Use   Vaping Use: Never used  Substance Use Topics   Alcohol use: Yes    Alcohol/week: 7.0 standard drinks    Types: 1 Cans of beer, 6 Glasses of wine per week   Drug use: No  Allergies   Nickel   Review of Systems Review of Systems Per HPI  Physical Exam Triage Vital Signs ED Triage Vitals  Enc Vitals Group     BP 11/24/20 1226 (!) 156/85     Pulse Rate 11/24/20 1226 72     Resp 11/24/20 1226 16     Temp 11/24/20 1226 98.2 F (36.8 C)     Temp Source 11/24/20 1226 Oral     SpO2 11/24/20 1226 98 %     Weight 11/24/20 1221 184 lb (83.5 kg)     Height 11/24/20 1221 '6\' 3"'$  (1.905 m)     Head Circumference --      Peak Flow --      Pain Score 11/24/20 1220 9     Pain Loc --      Pain Edu? --      Excl. in [place]? --    Updated Vital Signs BP (!) 156/85 (BP Location: Left Arm)   Pulse 72   Temp 98.2 F (36.8 C) (Oral)   Resp 16   Ht '6\' 3"'$  (1.905 m)   Wt 83.5 kg   SpO2 98%   BMI 23.00 kg/m   Visual Acuity Right Eye Distance:   Left Eye Distance:   Bilateral Distance:    Right Eye Near:   Left Eye Near:    Bilateral Near:     Physical Exam Vitals and nursing note reviewed.  Constitutional:      General: He is not in acute distress.    Appearance: Normal appearance. He is not ill-appearing.  HENT:     Head: Normocephalic and atraumatic.  Eyes:     General:        Right eye: No discharge.        Left eye: No discharge.      Conjunctiva/sclera: Conjunctivae normal.  Pulmonary:     Effort: Pulmonary effort is normal. No respiratory distress.  Musculoskeletal:     Comments: Right foot -lower extremity edema noted.  No significant tenderness to palpation on exam.  Patient is endorsing pain on the lateral aspect of the foot.  Palpable enthesophyte noted on the posterior calcaneus.  Neurological:     Mental Status: He is alert.  Psychiatric:        Mood and Affect: Mood normal.        Behavior: Behavior normal.   UC Treatments / Results  Labs (all labs ordered are listed, but only abnormal results are displayed) Labs Reviewed - No data to display  EKG   Radiology DG Foot Complete Right  Result Date: 11/24/2020 CLINICAL DATA:  Injury yesterday.  Pain. EXAM: RIGHT FOOT COMPLETE - 3+ VIEW COMPARISON:  Plain film of the RIGHT foot dated 05/13/2011. FINDINGS: No fracture line or displaced fracture fragment. Mild hallux valgus deformity at the RIGHT first MTP joint. No evidence of acute osseous dislocation. Stable appearance of the large enthesophytes at the dorsal and plantar margins of the posterior calcaneus. Vascular calcifications about the RIGHT ankle and midfoot. Soft tissues about the RIGHT foot are otherwise unremarkable. IMPRESSION: 1. No acute findings. No osseous fracture or dislocation. 2. Stable appearance of the large chronic enthesophytes at the dorsal and plantar margins of the posterior calcaneus. Electronically Signed   By: Franki Cabot M.D.   On: 11/24/2020 13:09    Procedures Procedures (including critical care time)  Medications Ordered in UC Medications - No data to display  Initial Impression / Assessment and Plan / UC Course  I have reviewed the triage vital signs and the nursing notes.  Pertinent labs & imaging results that were available during my care of the patient were reviewed by me and considered in my medical decision making (see chart for details).    80 year old male presents  with a foot sprain.  X-ray was obtained and was independently reviewed by me.  Interpretation: No evidence of fracture.  Advised rest, ice, elevation.  Tramadol as needed for pain.  Placed in a postop shoe for comfort.  Final Clinical Impressions(s) / UC Diagnoses   Final diagnoses:  Sprain of right foot, initial encounter     Discharge Instructions      Rest, ice, elevation  Tylenol as needed. Medication if needed for pain.  Take care  Dr. Lacinda Axon    ED Prescriptions     Medication Sig Dispense Auth. Provider   traMADol (ULTRAM) 50 MG tablet Take 1 tablet (50 mg total) by mouth every 12 (twelve) hours as needed. 10 tablet Thersa Salt G, DO      I have reviewed the PDMP during this encounter.   Coral Spikes, [place] 11/24/20 1354

## 2020-11-27 ENCOUNTER — Other Ambulatory Visit: Payer: Self-pay

## 2020-11-27 ENCOUNTER — Ambulatory Visit (INDEPENDENT_AMBULATORY_CARE_PROVIDER_SITE_OTHER): Payer: Medicare Other | Admitting: Podiatry

## 2020-11-27 DIAGNOSIS — S93601A Unspecified sprain of right foot, initial encounter: Secondary | ICD-10-CM | POA: Diagnosis not present

## 2020-11-27 DIAGNOSIS — G629 Polyneuropathy, unspecified: Secondary | ICD-10-CM | POA: Diagnosis not present

## 2020-11-27 NOTE — Patient Instructions (Signed)
Wear the compression sleeve if you can tolerate it (if it makes your pain worse do not wear it, stick with the ACE wrap) with the ankle brace   Rest at least until Monday and then you can increase your activity. Listen to your foot, if your pain gets worse with activity it is probably too much for you yet   See me in 5-6 weeks if still bothering you

## 2020-11-28 ENCOUNTER — Telehealth: Payer: Self-pay | Admitting: Cardiovascular Disease

## 2020-11-28 NOTE — Telephone Encounter (Signed)
Pt c/o BP issue: STAT if pt c/o blurred vision, one-sided weakness or slurred speech  1. What are your last 5 BP readings? 170/90  2. Are you having any other symptoms (ex. Dizziness, headache, blurred vision, passed out)? headache  3. What is your BP issue? Bp high. Would like to discuss possible medication changes.

## 2020-11-29 NOTE — Telephone Encounter (Signed)
Attempted to call the patient. No answer- I left a message to please call back.  

## 2020-11-29 NOTE — Telephone Encounter (Signed)
I spoke with the patient. He advised that he recently had an accident where he sprained his foot. He is currently in a brace and having trouble getting around due to not being able to put much weight on the affected foot.  He states he has been having a good bit of pain with the foot. He reports his BP has been well controlled until last night, he decided to take this and got a reading of 170/90. He had called the office close to 5pm yesterday, but we were unable to call him back.  He states he does have anxiety and started to feel anxious about his BP being elevated. He took this again and the reading was about the same with a SBP of 170.  He decided to take his PRN alprazolam and 0.1 mg of PRN clonidine. He states that ~ 1 hour later his BP was 114/80 and then a little later he was 128/81.  He advised that his concern is what to do with his BP over the next 2 weeks as he is dealing with the pain in his foot. He was questioning if he should go back on losartan.  I advised the patient I could see that a tramadol RX was sent in by Dr. Lacinda Axon on 11/24/20. In inquired if he has tried this and he advised he was concerned this would cause him to feel dizzy, then become more anxious.  I have advised the patient that since the root cause of his elevated pressures right now seems to be from his foot pain, he should try the tramadol to try to get this under control. At that point, he should be able to just use his PRN clonidine and less often than that use his PRN alprazolam.  I have advised when taking the tramadol, to start out taking this at bedtime to see how he does.  He is already scheduled for a follow up with Dr. Rockey Situ on 12/11/20.  The patient voices understanding of the above recommendations and is agreeable.  He will call back in the interim if needed.

## 2020-12-02 ENCOUNTER — Telehealth: Payer: Self-pay

## 2020-12-02 NOTE — Telephone Encounter (Signed)
Patient left a message earlier in the day. I called him back and discussed at length wearing the brace and the compression sleeve. Patient voiced understanding. Advised to call back with any further questions or problems.

## 2020-12-02 NOTE — Progress Notes (Signed)
  Subjective:  Patient ID: James Cardenas, male    DOB: June 29, 1940,  MRN: [id number]  Chief Complaint  Patient presents with   Toe Pain      two big toes painful    80 y.o. male returns with a new issue he has pain in the toes and pressure he thinks may be from a different pair shoes.  Feels burning over the top of the left foot.  He has ankle soreness and pain in the right side.  Recently injured his right hallux and is in a postop shoe he had x-rays done Objective:  Physical Exam: warm, good capillary refill, no trophic changes or ulcerative lesions, normal DP and PT pulses and normal sensory exam.  Mild pain over the lateral right fifth metatarsal base.  He has no reproducible pain in the toes themselves.  Prominent bone in the top of the foot bilaterally  Assessment:   1. Sprain of right foot, initial encounter   2. Peripheral neuritis      Plan:  Patient was evaluated and treated and all questions answered.  Most of his pain is in the fifth metatarsal base he has a sprain of the right foot.  No evidence of ankle injury or need for x-rays today.  No fracture on his previous x-rays.  I recommended supportive Tri-Lock ankle brace and compression sleeve.  This should resolve on its own.  Thick most of his toe pain is due to changes in shoe gear as well as pain with top of the foot causing compression of the dorsal nerve.  He already knows which shoes are painful for him and he will stop wearing these.      Return if symptoms worsen or fail to improve.

## 2020-12-04 ENCOUNTER — Telehealth: Payer: Self-pay | Admitting: Podiatry

## 2020-12-04 ENCOUNTER — Ambulatory Visit: Payer: Medicare Other | Admitting: Podiatry

## 2020-12-04 NOTE — Telephone Encounter (Signed)
Pt left message on nurse line in [place] and it was forwarded to me. Pt has questions about the shoe ( post op shoe)we gave him and his orthotics he cannot wear his orthotics in the shoe as it does not hold them in place. It makes his foot go a little sideways. How long should he wear that shoe and if he can fit the brace (tri loc)in a shoe would that be ok for him to wear.

## 2020-12-05 NOTE — Telephone Encounter (Signed)
Notified pt and he said thank you for checking and getting back to him. I told him if any other issues to call and we can schedule him an appt.

## 2020-12-05 NOTE — Telephone Encounter (Signed)
Thank you :)

## 2020-12-10 ENCOUNTER — Telehealth: Payer: Self-pay | Admitting: *Deleted

## 2020-12-10 NOTE — Telephone Encounter (Signed)
"  I have questions about the compression sock and the brace.  Please give me a call."    I am returning your call.  How can I help you?  "How long do I have to wear this compression sock.  It's starting to bother me.  I am wearing it under this brace.  It's hard to get all of this into a shoe.  When will I be able to increase my walking?"  Dr. Sherryle Lis said for you to wear the compression sleeve if you can tolerate it (if it makes your pain worse do not wear it, stick with the ACE wrap) with the ankle brace.  You were supposed to rest at least until Monday and then he said you can increase your activity. Listen to your foot, if your pain gets worse with activity it is probably too much for you yet.  "Okay, sorry to bother you, thanks for answering my questions.  I didn't want to do anything wrong."

## 2020-12-11 ENCOUNTER — Encounter: Payer: Self-pay | Admitting: Cardiovascular Disease

## 2020-12-11 ENCOUNTER — Ambulatory Visit (INDEPENDENT_AMBULATORY_CARE_PROVIDER_SITE_OTHER): Payer: Medicare Other | Admitting: Cardiovascular Disease

## 2020-12-11 ENCOUNTER — Other Ambulatory Visit: Payer: Self-pay

## 2020-12-11 VITALS — BP 164/80 | HR 65 | Ht 75.0 in | Wt 187.1 lb

## 2020-12-11 DIAGNOSIS — I483 Typical atrial flutter: Secondary | ICD-10-CM | POA: Diagnosis not present

## 2020-12-11 DIAGNOSIS — F419 Anxiety disorder, unspecified: Secondary | ICD-10-CM | POA: Diagnosis not present

## 2020-12-11 DIAGNOSIS — I5032 Chronic diastolic (congestive) heart failure: Secondary | ICD-10-CM | POA: Diagnosis not present

## 2020-12-11 DIAGNOSIS — Z8679 Personal history of other diseases of the circulatory system: Secondary | ICD-10-CM

## 2020-12-11 DIAGNOSIS — I10 Essential (primary) hypertension: Secondary | ICD-10-CM | POA: Diagnosis not present

## 2020-12-11 DIAGNOSIS — I350 Nonrheumatic aortic (valve) stenosis: Secondary | ICD-10-CM

## 2020-12-11 MED ORDER — AMOXICILLIN 500 MG PO CAPS: 2000.0000 mg | ORAL_CAPSULE | Freq: Every day | ORAL | 4 refills | Status: AC | PRN

## 2020-12-11 MED ORDER — LOSARTAN POTASSIUM 50 MG PO TABS: 50.0000 mg | ORAL_TABLET | Freq: Every day | ORAL | 3 refills | Status: DC | PRN

## 2020-12-11 NOTE — Progress Notes (Signed)
Evaluation Performed:  Follow-up visit  Date:  12/11/2020   ID:  [name], DOB Oct 11, 1940, MRN [id number]  Patient Location:  [address] [place] [address]   Provider location:   [facility], [place] office  PCP:  Olin Hauser, DO  Cardiologist:  Patsy Baltimore  Chief Complaint  Patient presents with   3 month follow up     "Doing well." Medications reviewed by the patient verbally.     History of Present Illness:    James Cardenas is a 80 y.o. male  past medical history of Aortic valve stenosis , severe, s/p TAVR Anxiety GERD Essential hypertension Overactive bladder Peripheral neuropathy PAD: Carotid stenosis:  left ICA are consistent with a 40-59% in 09/2019 cardiac catheterization on 07/28/2018 showing no significant coronary disease.  MGUS (monoclonal gammopathy of unknown significance) Chronic LE edema trace to 1 + to mid shin Prior cardioversion for atrial flutter October 2020,  Who presents for follow-up of his severe aortic valve stenosis, TAVR, ,atrial flutter  In follow up today, several issues to discuss Recent fall, twisted his ankle on right, and a brace Feels the pain in the ankle causing his blood pressure to run high  Tolerating eliquis, "learning to live with symptoms", h/a, take tylenol prn Dizziness is slowly getting better, still with lightheadedness, weakness which she attributes to the medication  Several months ago had 8 pound weight gain, took Lasix as needed with improvement of his weight and leg swelling Not on Lasix on a regular basis Down to 188 to 192 pounds, goal weight  Stopped Myrbetriq, thought it was causing symptoms of constipation has polyuria now  Work reviewed Total chol 129, LDL 50 A1C 5.8  Previously on diltiazem ER 180, dose was decreased down to 124 office visit On diltiazem er 120 mg has been doing well Chronic lower extremity edema  Reports he is moving up to  [place] to be closer to family Plans to move this December 2022  EKG personally reviewed by myself on todays visit Normal sinus rhythm rate 65 bpm no significant ST-T wave changes  Other past medical history [place], overheated, developed elevated heart rate Called office, started on propranolol,  BP dropped, rate stayed elevated Did not appreciated tachycardia Stayed elevated rate for several days,   Went to [facility] reviewed from [facility] Diltiazem 30 mg every 6 started TEE cardioversion July 15, 2020 Started on Eliquis Discharged on diltiazem 180 mg ER, losartan discontinued Noted to have significant anxiety  Echocardiogram done at [facility] 07/2020 normal ejection fraction Moderate LVH  Chest CT scan done No PE  Echo 06/2020  1. Left ventricular ejection fraction, by estimation, is 60 to 65%. The  left ventricle has normal function. The left ventricle has no regional  wall motion abnormalities. There is moderate left ventricular hypertrophy.  Left ventricular diastolic  parameters are consistent with Grade II diastolic dysfunction  (pseudonormalization).   2. Right ventricular systolic function is normal. The right ventricular  size is normal. Moderately increased right ventricular wall thickness.   3. Left atrial size was mildly dilated.   4. 26 Edwards Sapien bioprosthetic aortic valve (TAVR) valve is present      in the aortic position. Procedure Date: 09/13/18 The aortic valve was  not well visualized. Aortic valve regurgitation is not visualized. Aortic  valve mean gradient measures 15.0 mmHg. Aortic valve Vmax measures 2.52  m/s.    peripheral neuropathy affecting his balance  successful TAVR in 09/2018 a 26 mm Edwards Sapien 3 THV via the TF approach on 09/13/2018.   Post operative echo showed EF 55%, normally functioning TAVR with no PVL and mean gradient 19 mm Hg. He was discharged on aspirin and plavix.   1 month Echo 10/18/18  showed EF 55-60%, mean gradient 17 mmHG and no PVL.  On his last day of cardiac rehab noted to be in new onset atrial flutter with RVR underwent successful cardioversion on 01/20/2019.  elevated CHADS2VASc of at least 3 (HTN, age x 2)  He chose not to be on Eliquis/anticoagulation, reported having significant anxiety concerning the medication Reports prior history of depression  Cardiac catheterization July 28, 2018 Left ventriculography: Left ventricular systolic function is normal, LVEF is estimated at 55-65%, there is no significant mitral regurgitation , no significant aortic valve stenosis   Past Medical History:  Diagnosis Date   Anxiety    Arthritis of knee    Atypical atrial flutter ([place])    a. Dx 12/15/2018. CHA2DS2VASc 3-->eliquis.   Carotid artery disease (HCC)    Constipation    due to Myrbetriq-     Hearing loss    wears hearing aids   Hypertension    Neuropathy    Nonobstructive Coronary artery disease    a. 07/2018 Cath: LMN nl, LAD/LCX/RCA no significant dzs. EF 55-65%.   S/P TAVR (transcatheter aortic valve replacement)    a. 09/2018 TAVR: 26 mm Edwards Sapien THV via the TF approach; b. 10/2018 Echo: EF 55-60%. Nl RV fxn. RVSP 20.78mHg. Mild BAE. No AI or perivalvular leak.   Severe aortic stenosis    Past Surgical History:  Procedure Laterality Date   CARDIOVERSION N/A 01/20/2019   Procedure: CARDIOVERSION;  Surgeon: GMinna Merritts MD;  Location: [facility] ORS;  Service: Cardiovascular;  Laterality: N/A;   COLONOSCOPY     EYE SURGERY Bilateral    cataract   HERNIA REPAIR Left    Inguinal   NECK SURGERY  2011   Cervical Fusion   RIGHT HEART CATH AND CORONARY ANGIOGRAPHY N/A 07/28/2018   Procedure: RIGHT HEART CATH AND CORONARY ANGIOGRAPHY;  Surgeon: GMinna Merritts MD;  [facility];  Service: Cardiovascular;  Laterality: N/A;   TEE WITHOUT CARDIOVERSION N/A 09/13/2018   Procedure: TRANSESOPHAGEAL ECHOCARDIOGRAM (TEE);  Surgeon: CSherren Mocha MD;  [place]: [facility]  Service: Open Heart Surgery;  Laterality: N/A;   TRANSCATHETER AORTIC VALVE REPLACEMENT, TRANSFEMORAL  09/13/2018   TRANSCATHETER AORTIC VALVE REPLACEMENT, TRANSFEMORAL N/A 09/13/2018   Procedure: TRANSCATHETER AORTIC VALVE REPLACEMENT, TRANSFEMORAL;  Surgeon: CSherren Mocha MD;  Location: [facility]  Service: Open Heart Surgery;  Laterality: N/A;     Current Outpatient Medications on File Prior to Visit  Medication Sig Dispense Refill   Alpha-Lipoic Acid 600 MG CAPS Take 600 mg by mouth every morning.      ALPRAZolam (XANAX) 0.5 MG tablet TAKE 1 TABLET BY MOUTH TWICE A DAY AS NEEDED FOR ANXIETY 60 tablet 2   Azelaic Acid 15 % cream Apply 1 application topically daily. After skin is thoroughly washed and patted dry, gently but thoroughly massage a thin film of azelaic acid cream into the affected area once daily     Carboxymethylcellul-Glycerin (LUBRICATING EYE DROPS OP) Place 1 drop into both eyes 2 (two) times a day.     cholecalciferol (VITAMIN D3) 25 MCG (1000 UT) tablet Take 1,000 Units by mouth daily.  cloNIDine (CATAPRES) 0.1 MG tablet Take 1 tablet (0.1 mg total) by mouth every 8 (eight) hours as needed (for systolic blood pressure (top number) greater than or equal to 160.). 90 tablet 0   diltiazem (CARDIZEM CD) 120 MG 24 hr capsule Take 1 capsule (120 mg total) by mouth daily. 90 capsule 3   ELIQUIS 5 MG TABS tablet SMARTSIG:1 Tablet(s) By Mouth Morning-Night     Multiple Vitamin (MULTIVITAMIN WITH MINERALS) TABS tablet Take 1 tablet by mouth daily in the afternoon. Centrum     OVER THE COUNTER MEDICATION Take 1 tablet by mouth at bedtime as needed (sleep). GNC PREVENTIVE NUTRITION TRI-SLEEP     polyethylene glycol (MIRALAX / GLYCOLAX) packet Take 17 g by mouth 2 (two) times daily as needed (constipation).      rosuvastatin (CRESTOR) 10 MG tablet TAKE 1 TABLET BY MOUTH EVERY DAY 90 tablet 0   traMADol (ULTRAM) 50 MG tablet Take 1 tablet (50 mg total) by  mouth every 12 (twelve) hours as needed. 10 tablet 0   tretinoin (RETIN-A) 0.1 % cream Apply 1 application topically at bedtime.   0   triamcinolone cream (KENALOG) 0.1 % APPLY TWICE DAILY TO AFFECTED RASH AS NEEDED     furosemide (LASIX) 20 MG tablet Take 1 tablet (20 mg total) by mouth daily as needed. (Patient not taking: Reported on 12/11/2020) 90 tablet 3   meclizine (ANTIVERT) 25 MG tablet Take 1 tablet (25 mg total) by mouth 3 (three) times daily as needed for dizziness. (Patient not taking: Reported on 12/11/2020) 30 tablet 3   No current facility-administered medications on file prior to visit.    Allergies:   Nickel   Social History   Tobacco Use   Smoking status: Never   Smokeless tobacco: Never  Vaping Use   Vaping Use: Never used  Substance Use Topics   Alcohol use: Yes    Alcohol/week: 7.0 standard drinks    Types: 1 Cans of beer, 6 Glasses of wine per week   Drug use: No     Family Hx: The patient's family history includes Alzheimer's disease in his sister; Lung cancer in his father; Pancreatic cancer in his brother; Thyroid disease in his mother. There is no history of Prostate cancer, Bladder Cancer, or Kidney cancer.  ROS:   Please see the history of present illness.    Review of Systems  Constitutional: Negative.   HENT: Negative.    Respiratory: Negative.    Cardiovascular:  Positive for leg swelling.  Gastrointestinal: Negative.   Musculoskeletal: Negative.   Neurological: Negative.   Psychiatric/Behavioral: Negative.    All other systems reviewed and are negative.  Labs/Other Tests and Data Reviewed:    Recent Labs: 05/03/2020: TSH 2.479 05/17/2020: B Natriuretic Peptide 162.8; Hemoglobin 14.4; Platelets 168 11/04/2020: ALT 19; BUN 16; Creat 0.87; Potassium 4.2; Sodium 138   Recent Lipid Panel Lab Results  Component Value Date/Time   CHOL 129 11/04/2020 10:54 AM   CHOL 171 11/06/2013 12:00 AM   TRIG 47 11/04/2020 10:54 AM   TRIG 45 11/06/2013  12:00 AM   HDL 66 11/04/2020 10:54 AM   HDL 72 11/06/2013 12:00 AM   CHOLHDL 2.0 11/04/2020 10:54 AM   LDLCALC 50 11/04/2020 10:54 AM    Wt Readings from Last 3 Encounters:  12/11/20 187 lb 2 oz (84.9 kg)  11/24/20 184 lb (83.5 kg)  09/25/20 191 lb 11 oz (87 kg)     Exam:    Vital Signs: Vital  signs may also be detailed in the HPI BP (!) 164/80 (BP Location: Left Arm, Patient Position: Sitting, Cuff Size: Normal)   Pulse 65   Ht '6\' 3"'$  (1.905 m)   Wt 187 lb 2 oz (84.9 kg)   SpO2 99%   BMI 23.39 kg/m    Constitutional:  oriented to person, place, and time. No distress.  HENT:  Head: Grossly normal Eyes:  no discharge. No scleral icterus.  Neck: No JVD, no carotid bruits  Cardiovascular: Regular rate and rhythm, no murmurs appreciated Pulmonary/Chest: Clear to auscultation bilaterally, no wheezes or rails Abdominal: Soft.  no distension.  no tenderness.  Musculoskeletal: Normal range of motion Neurological:  normal muscle tone. Coordination normal. No atrophy Skin: Skin warm and dry Psychiatric: normal affect, pleasant   ASSESSMENT & PLAN:    Severe aortic stenosis Successful TAVR,  stable valve on recent echocardiogram February 2022 No change on clinical exam Discussed need for prophylactic antibiotics prior to dental cleaning, amoxicillin prescription provided  Atrial flutter Episode April 2022, evaluated at [facility] requiring TEE cardioversion CHADS VASC 3+ Continues to maintain normal sinus rhythm Tolerating Eliquis Continue diltiazem ER 120 daily   Generalized anxiety disorder Working with PMD Seems to do much better with taking Xanax on as-needed basis This helps his blood pressure is well  Essential hypertension Diltiazem ER 120 daily Recommend he take losartan 50 mg as needed for systolic pressure over [id number] Prefers to treat blood pressure aggressively and have as needed medication He does have clonidine for pressure over 160 Lasix as needed Blood pressure  typically running [id number] up to [id number] systolic Slightly higher in the setting of recent pain to his ankle  MGUS (monoclonal gammopathy of unknown significance) Managed by primary care  Leg edema Suspect component of venous insufficiency Recommend compression hose Potentially could hold the calcium channel blocker if symptoms get worse but he is happy with current situation  Chronic dizziness Meclizine as needed   Total encounter time more than 35 minutes  Greater than 50% was spent in counseling and coordination of care with the patient   Signed, Ida Rogue, MD  12/11/2020 12:01 PM    [facility] [id number], [place], [place] [address]

## 2020-12-11 NOTE — Patient Instructions (Addendum)
Medication Instructions:  Please take Losartan 50 mg daily as needed for pressure > 140.   If you need a refill on your cardiac medications before your next appointment, please call your pharmacy.    Lab work: No new labs needed   If you have labs (blood work) drawn today and your tests are completely normal, you will receive your results only by: [place] (if you have MyChart) OR A paper copy in the mail If you have any lab test that is abnormal or we need to change your treatment, we will call you to review the results.   Testing/Procedures: No new testing needed   Follow-Up: At [facility], you and your health needs are our priority.  As part of our continuing mission to provide you with exceptional heart care, we have created designated Provider Care Teams.  These Care Teams include your primary Cardiologist (physician) and Advanced Practice Providers (APPs -  Physician Assistants and Nurse Practitioners) who all work together to provide you with the care you need, when you need it.  You will need a follow up appointment in 6 months   Providers on your designated Care Team:   Murray Hodgkins, NP Christell Faith, PA-C Marrianne Mood, PA-C Cadence Kathlen Mody, [place]  Any Other Special Instructions Will Be Listed Below (If Applicable).  COVID-19 Vaccine Information can be found at: [email or web address].uk For questions related to vaccine distribution or appointments, please email [email or web address] or call [phone].

## 2020-12-18 ENCOUNTER — Other Ambulatory Visit: Payer: Self-pay

## 2020-12-18 ENCOUNTER — Ambulatory Visit (INDEPENDENT_AMBULATORY_CARE_PROVIDER_SITE_OTHER): Payer: Medicare Other | Admitting: Podiatry

## 2020-12-18 DIAGNOSIS — G629 Polyneuropathy, unspecified: Secondary | ICD-10-CM | POA: Diagnosis not present

## 2020-12-18 DIAGNOSIS — M19271 Secondary osteoarthritis, right ankle and foot: Secondary | ICD-10-CM | POA: Diagnosis not present

## 2020-12-18 DIAGNOSIS — S93601A Unspecified sprain of right foot, initial encounter: Secondary | ICD-10-CM

## 2020-12-18 DIAGNOSIS — L600 Ingrowing nail: Secondary | ICD-10-CM

## 2020-12-18 DIAGNOSIS — M19272 Secondary osteoarthritis, left ankle and foot: Secondary | ICD-10-CM

## 2020-12-19 NOTE — Progress Notes (Signed)
  Subjective:  Patient ID: James Cardenas, male    DOB: 23-Oct-1940,  MRN: [id number]  Chief Complaint  Patient presents with   Foot Pain    follow up has some questions    80 y.o. male returns for follow-up his injury from his right foot is doing much better and continues to progress.  He has a several shoes that he would like me to evaluate to see if he is on the best thing for him.  Toe on the left foot has a small hangnail that he would like to be debrided Objective:  Physical Exam: warm, good capillary refill, no trophic changes or ulcerative lesions, normal DP and PT pulses and normal sensory exam.  Minimal pain over the lateral right fifth metatarsal base.  He has no reproducible pain in the toes themselves.  Prominent bone in the top of the foot bilaterally.  Left fourth toe medial border has a small hangnail  Assessment:   1. Sprain of right foot, initial encounter   2. Peripheral neuritis   3. Ingrown nail of fourth toe of left foot   4. Other secondary osteoarthritis of both feet      Plan:  Patient was evaluated and treated and all questions answered.  Doing well and recovering from his injury.  I think he can discontinue using the brace for most of his ambulation he should wear good supportive shoe gear which I reviewed a number of his shoes today I think all of these are appropriate for him but advised that he should avoid anything compressing or pressing on the top of his foot as this may be causing some of the neuritic pain in combination with midfoot arthritis which we are aware of and he is understanding that there is likely not a good solution for this and something we will have to live with   Debrided the hangnail on the left fourth medial toe    No follow-ups on file.

## 2020-12-25 ENCOUNTER — Other Ambulatory Visit: Payer: Self-pay | Admitting: Cardiovascular Disease

## 2021-01-02 DIAGNOSIS — Z23 Encounter for immunization: Secondary | ICD-10-CM | POA: Diagnosis not present

## 2021-01-03 ENCOUNTER — Other Ambulatory Visit: Payer: Self-pay | Admitting: Family Medicine

## 2021-01-03 DIAGNOSIS — F418 Other specified anxiety disorders: Secondary | ICD-10-CM

## 2021-01-03 MED ORDER — ALPRAZOLAM 0.5 MG PO TABS: ORAL_TABLET | ORAL | 2 refills | Status: DC

## 2021-01-03 NOTE — Telephone Encounter (Signed)
Copied from [place] [id number]. Topic: Quick Communication - Rx Refill/Question >> Jan 03, 2021 11:02 AM Leward Quan A wrote: Medication: ALPRAZolam Duanne Moron) 0.5 MG tablet Per patient heart Dr states he need this to help with his BP  Has the patient contacted their pharmacy? No. (Agent: If no, request that the patient contact the pharmacy for the refill.) (Agent: If yes, when and what did the pharmacy advise?)  Preferred Pharmacy (with phone number or street name): [id number] - [place], [place]  Phone:  [phone] Fax:  [phone]    Has the patient been seen for an appointment in the last year OR does the patient have an upcoming appointment? Yes.    Agent: Please be advised that RX refills may take up to 3 business days. We ask that you follow-up with your pharmacy.

## 2021-01-03 NOTE — Telephone Encounter (Signed)
Requested medication (s) are due for refill today: Yes  Requested medication (s) are on the active medication list: Yes  Last refill:  09/25/20  Future visit scheduled: Yes  Notes to clinic:  Unable to refill per protocol, cannot delegate.      Requested Prescriptions  Pending Prescriptions Disp Refills   ALPRAZolam (XANAX) 0.5 MG tablet 60 tablet 2    Sig: TAKE 1 TABLET BY MOUTH TWICE A DAY AS NEEDED FOR ANXIETY     Not Delegated - Psychiatry:  Anxiolytics/Hypnotics Failed - 01/03/2021  1:13 PM      Failed - This refill cannot be delegated      Failed - Urine Drug Screen completed in last 360 days      Passed - Valid encounter within last 6 months    Recent Outpatient Visits           3 months ago Situational anxiety   [place], DO   8 months ago Viral URI with cough   [place], DO   9 months ago Essential hypertension   [place], DO   1 year ago Elevated hemoglobin A1c   [place], DO   1 year ago Other constipation   [place], DO       Future Appointments             In 1 month [place], Devonne Doughty, [facility], [place]   In 4 months [place], Ronda Fairly, [facility]

## 2021-01-06 DIAGNOSIS — Z23 Encounter for immunization: Secondary | ICD-10-CM | POA: Diagnosis not present

## 2021-01-07 ENCOUNTER — Ambulatory Visit: Payer: PRIVATE HEALTH INSURANCE | Admitting: Cardiovascular Disease

## 2021-01-20 ENCOUNTER — Telehealth: Payer: Self-pay | Admitting: Podiatry

## 2021-01-20 NOTE — Telephone Encounter (Signed)
Pt left message asking about estimated time for refurbished orthotics back.  I returned call and pt said he called back and they told him 4 to 6 wks.  I explained for refurbishing its usually about 2 to maybe 3 weeks.  He also asked about the cost and was told it went up to 98.00 and I told pt not to my knowledge that it has went up so it is still 90.00 that I am aware of. He is going to drop them off this week and we will try to have them back for his 10.24 appt.

## 2021-01-21 ENCOUNTER — Telehealth: Payer: Self-pay | Admitting: Cardiovascular Disease

## 2021-01-21 DIAGNOSIS — M722 Plantar fascial fibromatosis: Secondary | ICD-10-CM

## 2021-01-21 NOTE — Telephone Encounter (Signed)
Pt c/o swelling: STAT is pt has developed SOB within 24 hours  If swelling, where is the swelling located? R ankle and foot  c/o some numbness denies injury or pain in that foot   How much weight have you gained and in what time span? Lost weight   Have you gained 3 pounds in a day or 5 pounds in a week? No   Do you have a log of your daily weights (if so, list)? 181 down from 192   Are you currently taking a fluid pill? Not taking concerned about dehydration   Are you currently SOB? No   Have you traveled recently? No   Patient not sure if he should take fluid pill.  He is concerned with recent weight loss and yellow color of urine with increased dehydration.    Please advise

## 2021-01-22 ENCOUNTER — Ambulatory Visit: Payer: Self-pay

## 2021-01-22 NOTE — Telephone Encounter (Signed)
Was able to reach out to Mr. James Cardenas, returning his call regarding his right leg, ankle, and foot swelling. Pt reports no redness, no tightness, or pain. Denies any swelling to the left leg or other areas. No new injuries, did sprain his right foot and was seen 8/14 for same.   Pt reports no weight gain, was 198 lbs before summer, was on lasix and weight reduce to 181 lbs. Pt stop his lasix during the summer d/t concern for dehydration and his swelling ,weight, and BP were "under control"  Pt reports took a lasix 20 mg yesterday, noticed some swelling to his right foot did reduce, pt reports does have long hx of right foot/ankle swelling which is "normal for me, but now it is outside my normal". Pt st unsure if it could be from injury, his nephrology, or something else as it is isolated to right leg only.   Advised to take his Lasix 20 mg daily as this has been stop by self over the summer, monitor BP, monitor weight, can increase fluid intake if concern for possible dehydration d/t "urine being yellow urine". Mr. James Cardenas verbalized understanding and agrees with plan.  Will call Dr. Parks Cardenas if not better as he feels this may not be a cardiac issues right now, if things change will call back with further concerns. Mr. James Cardenas thankful for returning his call and advice.

## 2021-01-22 NOTE — Telephone Encounter (Signed)
Patient called and says he has right ankle swelling that goes up to the bottom of the leg. He says yesterday he took a fluid pill and the swelling is better today, mild-moderate swelling he reports. He denies pain, no redness, no tenderness, no other symptoms. MyChart video visit scheduled for Friday 01/24/21 at 1120 with Dr. Parks Ranger, care advice given, patient verbalized understanding.  Reason for Disposition  Swollen ankle joint  (Exception: area of localized swelling which is itchy)  Answer Assessment - Initial Assessment Questions 1. LOCATION: "Which ankle is swollen?" "Where is the swelling?"     Right ankle and foot up to the bottom of the leg 2. ONSET: "When did the swelling start?"     Swelling worst yesterday 3. SIZE: "How large is the swelling?"     Today I can see the ankles and foot definition (light to medium swelling) 4. PAIN: "Is there any pain?" If Yes, ask: "How bad is it?" (Scale 1-10; or mild, moderate, severe)   - NONE (0): no pain.   - MILD (1-3): doesn't interfere with normal activities.    - MODERATE (4-7): interferes with normal activities (e.g., work or school) or awakens from sleep, limping.    - SEVERE (8-10): excruciating pain, unable to do any normal activities, unable to walk.      No 5. CAUSE: "What do you think caused the ankle swelling?"      Chronic 6. OTHER SYMPTOMS: "Do you have any other symptoms?" (e.g., fever, chest pain, difficulty breathing, calf pain)     Foot numbness 7. PREGNANCY: "Is there any chance you are pregnant?" "When was your last menstrual period?"     N/A  Protocols used: Ankle Swelling-A-AH

## 2021-01-24 ENCOUNTER — Other Ambulatory Visit: Payer: Self-pay

## 2021-01-24 ENCOUNTER — Telehealth (INDEPENDENT_AMBULATORY_CARE_PROVIDER_SITE_OTHER): Payer: Medicare Other | Admitting: Family Medicine

## 2021-01-24 ENCOUNTER — Encounter: Payer: Self-pay | Admitting: Family Medicine

## 2021-01-24 VITALS — Ht 75.0 in | Wt 181.0 lb

## 2021-01-24 DIAGNOSIS — G609 Hereditary and idiopathic neuropathy, unspecified: Secondary | ICD-10-CM | POA: Diagnosis not present

## 2021-01-24 DIAGNOSIS — I872 Venous insufficiency (chronic) (peripheral): Secondary | ICD-10-CM | POA: Diagnosis not present

## 2021-01-24 NOTE — Patient Instructions (Addendum)
Thank you for coming to the office today.  Use furosemide 20mg  as needed or twice a week for now for prevention  Use RICE therapy: - R - Rest / relative rest with activity modification avoid overuse of joint - I - Ice packs (make sure you use a towel or sock / something to protect skin) - C - Compression Socks to apply pressure and reduce swelling allowing more support - E - Elevation - if significant swelling, lift leg above heart level (toes above your nose) to help reduce swelling, most helpful at night after day of being on your feet   Please schedule a Follow-up Appointment to: Return if symptoms worsen or fail to improve, for keep upcoming apt.  If you have any other questions or concerns, please feel free to call the office or send a message through [place]. You may also schedule an earlier appointment if necessary.  Additionally, you may be receiving a survey about your experience at our office within a few days to 1 week by e-mail or mail. We value your feedback.  [name], DO [place]

## 2021-01-24 NOTE — Progress Notes (Signed)
Virtual Visit via Telephone The purpose of this virtual visit is to provide medical care while limiting exposure to the novel coronavirus (COVID19) for both patient and office staff.  Consent was obtained for phone visit:  Yes.   Answered questions that patient had about telehealth interaction:  Yes.   I discussed the limitations, risks, security and privacy concerns of performing an evaluation and management service by telephone. I also discussed with the patient that there may be a patient responsible charge related to this service. The patient expressed understanding and agreed to proceed.  Patient Location: Home Provider Location: [facility] (Office)  Participants in virtual visit: - Patient: James Cardenas "Ron Revak" - CMA: Orinda Kenner, CMA - Provider: Dr Parks Ranger  ---------------------------------------------------------------------- Chief Complaint  Patient presents with   Leg Swelling    S: Reviewed CMA documentation. I have called patient and gathered additional HPI as follows:   Right Lower Extremity We reviewed background history with Cardiology has put him on diuretic due to water weight and edema.  Interval updates - his weight has gone down, with diet and lifestyle changes, he said that he was monitoring  Wt down to 181 lbs, he was asking when to restart Fluid pills if needed again.  Recently he contacted our office 2 days ago 10/12 with acute R ankle swelling and fluid edema and uncertain cause, it has improved with Furosemide 35m as needed, now he takes twice a week and tries to remain hydrated. He uses compression socks as well.  He feels occasional lightheaded dizziness dehydration symptoms. He has had some side effect of eliquis as well similar  Peripheral Neuropathy, Chronic Previous followed by Vascular / Neurology Off Gabapentin. On Alpha Lipoic Acid with improvement.   S/p TAVR / Aortic Stenosis / regurgitation, Moderate / Heart  Murmur Followed by Cardiothoracic Surgery/Cardiology   Denies any known or suspected exposure to person with or possibly with COVID19.  Denies any fevers, chills, sweats, body ache, cough, shortness of breath, sinus pain or pressure, headache, abdominal pain, diarrhea  Past Medical History:  Diagnosis Date   Anxiety    Arthritis of knee    Atypical atrial flutter ([facility]    a. Dx 12/15/2018. CHA2DS2VASc 3-->eliquis.   Carotid artery disease (HCC)    Constipation    due to Myrbetriq-     Hearing loss    wears hearing aids   Hypertension    Neuropathy    Nonobstructive Coronary artery disease    a. 07/2018 Cath: LMN nl, LAD/LCX/RCA no significant dzs. EF 55-65%.   S/P TAVR (transcatheter aortic valve replacement)    a. 09/2018 TAVR: 26 mm Edwards Sapien THV via the TF approach; b. 10/2018 Echo: EF 55-60%. Nl RV fxn. RVSP 20.137mg. Mild BAE. No AI or perivalvular leak.   Severe aortic stenosis    Social History   Tobacco Use   Smoking status: Never   Smokeless tobacco: Never  Vaping Use   Vaping Use: Never used  Substance Use Topics   Alcohol use: Yes    Alcohol/week: 7.0 standard drinks    Types: 1 Cans of beer, 6 Glasses of wine per week   Drug use: No    Current Outpatient Medications:    Alpha-Lipoic Acid 600 MG CAPS, Take 600 mg by mouth every morning. , Disp: , Rfl:    ALPRAZolam (XANAX) 0.5 MG tablet, TAKE 1 TABLET BY MOUTH TWICE A DAY AS NEEDED FOR ANXIETY, Disp: 60 tablet, Rfl: 2  amoxicillin (AMOXIL) 500 MG capsule, Take 4 capsules (2,000 mg total) by mouth daily as needed (before dental procedure)., Disp: 4 capsule, Rfl: 4   Azelaic Acid 15 % cream, Apply 1 application topically daily. After skin is thoroughly washed and patted dry, gently but thoroughly massage a thin film of azelaic acid cream into the affected area once daily, Disp: , Rfl:    Carboxymethylcellul-Glycerin (LUBRICATING EYE DROPS OP), Place 1 drop into both eyes 2 (two) times a day., Disp: , Rfl:     cholecalciferol (VITAMIN D3) 25 MCG (1000 UT) tablet, Take 1,000 Units by mouth daily., Disp: , Rfl:    cloNIDine (CATAPRES) 0.1 MG tablet, Take 1 tablet (0.1 mg total) by mouth every 8 (eight) hours as needed (for systolic blood pressure (top number) greater than or equal to 160.)., Disp: 90 tablet, Rfl: 0   ELIQUIS 5 MG TABS tablet, SMARTSIG:1 Tablet(s) By Mouth Morning-Night, Disp: , Rfl:    losartan (COZAAR) 50 MG tablet, Take 1 tablet (50 mg total) by mouth daily as needed (For pressure >140)., Disp: 60 tablet, Rfl: 3   Multiple Vitamin (MULTIVITAMIN WITH MINERALS) TABS tablet, Take 1 tablet by mouth daily in the afternoon. Centrum, Disp: , Rfl:    OVER THE COUNTER MEDICATION, Take 1 tablet by mouth at bedtime as needed (sleep). GNC PREVENTIVE NUTRITION TRI-SLEEP, Disp: , Rfl:    polyethylene glycol (MIRALAX / GLYCOLAX) packet, Take 17 g by mouth 2 (two) times daily as needed (constipation). , Disp: , Rfl:    rosuvastatin (CRESTOR) 10 MG tablet, TAKE 1 TABLET BY MOUTH EVERY DAY, Disp: 90 tablet, Rfl: 2   traMADol (ULTRAM) 50 MG tablet, Take 1 tablet (50 mg total) by mouth every 12 (twelve) hours as needed., Disp: 10 tablet, Rfl: 0   tretinoin (RETIN-A) 0.1 % cream, Apply 1 application topically at bedtime. , Disp: , Rfl: 0   triamcinolone cream (KENALOG) 0.1 %, APPLY TWICE DAILY TO AFFECTED RASH AS NEEDED, Disp: , Rfl:    diltiazem (CARDIZEM CD) 120 MG 24 hr capsule, Take 1 capsule (120 mg total) by mouth daily., Disp: 90 capsule, Rfl: 3   furosemide (LASIX) 20 MG tablet, Take 1 tablet (20 mg total) by mouth daily as needed. (Patient not taking: No sig reported), Disp: 90 tablet, Rfl: 3   meclizine (ANTIVERT) 25 MG tablet, Take 1 tablet (25 mg total) by mouth 3 (three) times daily as needed for dizziness. (Patient not taking: No sig reported), Disp: 30 tablet, Rfl: 3  Depression screen [facility] 2/9 09/25/2020 03/25/2020 03/25/2020  Decreased Interest 1 0 0  Down, Depressed, Hopeless 0 0 0  PHQ - 2  Score 1 0 0  Altered sleeping 0 - -  Tired, decreased energy 0 - -  Change in appetite 0 - -  Feeling bad or failure about yourself  0 - -  Trouble concentrating 0 - -  Moving slowly or fidgety/restless 0 - -  Suicidal thoughts 0 - -  PHQ-9 Score 1 - -  Difficult doing work/chores Not difficult at all - -  Some recent data might be hidden    GAD 7 : Generalized Anxiety Score 09/25/2020 04/16/2017 10/06/2016 07/02/2016  Nervous, Anxious, on Edge 1 0 1 1  Control/stop worrying 0 0 0 0  Worry too much - different things 0 0 0 0  Trouble relaxing 0 0 0 0  Restless 0 0 0 0  Easily annoyed or irritable 0 - 0 0  Afraid - awful might  happen 0 0 0 0  Total GAD 7 Score 1 - 1 1  Anxiety Difficulty Not difficult at all Not difficult at all Not difficult at all Not difficult at all    -------------------------------------------------------------------------- O: No physical exam performed due to remote telephone encounter.  Lab results reviewed.  Recent Results (from the past 2160 hour(s))  COMPLETE METABOLIC PANEL WITH GFR     Status: Abnormal   Collection Time: 11/04/20 10:54 AM  Result Value Ref Range   Glucose, Bld 97 65 - 99 mg/dL    Comment: .            Fasting reference interval .    BUN 16 7 - 25 mg/dL   Creat 0.87 0.70 - 1.28 mg/dL   eGFR 88 > OR = 60 mL/min/1.76m    Comment: The eGFR is based on the CKD-EPI 2021 equation. To calculate  the new eGFR from a previous Creatinine or Cystatin C result, go to https://www.kidney.org/professionals/ kdoqi/gfr%5Fcalculator    BUN/Creatinine Ratio NOT APPLICABLE 6 - 22 (calc)   Sodium 138 135 - 146 mmol/L   Potassium 4.2 3.5 - 5.3 mmol/L   Chloride 104 98 - 110 mmol/L   CO2 25 20 - 32 mmol/L   Calcium 9.2 8.6 - 10.3 mg/dL   Total Protein 7.2 6.1 - 8.1 g/dL   Albumin 4.2 3.6 - 5.1 g/dL   Globulin 3.0 1.9 - 3.7 g/dL (calc)   AG Ratio 1.4 1.0 - 2.5 (calc)   Total Bilirubin 1.3 (H) 0.2 - 1.2 mg/dL   Alkaline phosphatase (APISO) 77  35 - 144 U/L   AST 25 10 - 35 U/L   ALT 19 9 - 46 U/L  PSA     Status: None   Collection Time: 11/04/20 10:54 AM  Result Value Ref Range   PSA 3.29 < OR = 4.00 ng/mL    Comment: The total PSA value from this assay system is  standardized against the WHO standard. The test  result will be approximately 20% lower when compared  to the equimolar-standardized total PSA (Beckman  Coulter). Comparison of serial PSA results should be  interpreted with this fact in mind. . This test was performed using the Siemens  chemiluminescent method. Values obtained from  different assay methods cannot be used interchangeably. PSA levels, regardless of value, should not be interpreted as absolute evidence of the presence or absence of disease.   Lipid panel     Status: None   Collection Time: 11/04/20 10:54 AM  Result Value Ref Range   Cholesterol 129 <200 mg/dL   HDL 66 > OR = 40 mg/dL   Triglycerides 47 <150 mg/dL   LDL Cholesterol (Calc) 50 mg/dL (calc)    Comment: Reference range: <100 . Desirable range <100 mg/dL for primary prevention;   <70 mg/dL for patients with CHD or diabetic patients  with > or = 2 CHD risk factors. .Marland KitchenLDL-C is now calculated using the Martin-Hopkins  calculation, which is a validated novel method providing  better accuracy than the Friedewald equation in the  estimation of LDL-C.  MCresenciano Genreet al. JAnnamaria Helling [id number](68: [id number]  (http://education.[employer].com/faq/FAQ164)    Total CHOL/HDL Ratio 2.0 <5.0 (calc)   Non-HDL Cholesterol (Calc) 63 <130 mg/dL (calc)    Comment: For patients with diabetes plus 1 major ASCVD risk  factor, treating to a non-HDL-C goal of <100 mg/dL  (LDL-C of <70 mg/dL) is considered a therapeutic  option.   Hemoglobin A1c  Status: Abnormal   Collection Time: 11/04/20 10:54 AM  Result Value Ref Range   Hgb A1c MFr Bld 5.8 (H) <5.7 % of total Hgb    Comment: For someone without known diabetes, a hemoglobin  A1c value  between 5.7% and 6.4% is consistent with prediabetes and should be confirmed with a  follow-up test. . For someone with known diabetes, a value <7% indicates that their diabetes is well controlled. A1c targets should be individualized based on duration of diabetes, age, comorbid conditions, and other considerations. . This assay result is consistent with an increased risk of diabetes. . Currently, no consensus exists regarding use of hemoglobin A1c for diagnosis of diabetes for children. .    Mean Plasma Glucose 120 mg/dL   eAG (mmol/L) 6.6 mmol/L    -------------------------------------------------------------------------- A&P:  Problem List Items Addressed This Visit     Venous insufficiency of both lower extremities - Primary   Peripheral neuropathy   Venous Insufficiency, LE Edema Similar to prior discussion with edema in legs, now some asymmetry has improved with furosemide Discussed agreement with intermittent dosing can do more preventative with Furosemide 51m twice a week for now and goal to do RICE therapy elevation compression socks as well. Monitor Creatinine / kidney function in future   No orders of the defined types were placed in this encounter.   Follow-up: - Return in for scheduled apt 2 weeks  Patient verbalizes understanding with the above medical recommendations including the limitation of remote medical advice.  Specific follow-up and call-back criteria were given for patient to follow-up or seek medical care more urgently if needed.   - Time spent in direct consultation with patient on phone: 15 minutes   ANobie Putnam [facility] 01/24/2021, 11:36 AM

## 2021-01-28 ENCOUNTER — Ambulatory Visit (INDEPENDENT_AMBULATORY_CARE_PROVIDER_SITE_OTHER): Payer: Medicare Other

## 2021-01-28 VITALS — Ht 75.0 in | Wt 180.0 lb

## 2021-01-28 DIAGNOSIS — Z0000 Encounter for general adult medical examination without abnormal findings: Secondary | ICD-10-CM

## 2021-01-28 NOTE — Progress Notes (Signed)
I connected with Etai Copado today by telephone and verified that I am speaking with the correct person using two identifiers. Location patient: home Location provider: work Persons participating in the virtual visit: Glynn, Yepes LPN.   I discussed the limitations, risks, security and privacy concerns of performing an evaluation and management service by telephone and the availability of in person appointments. I also discussed with the patient that there may be a patient responsible charge related to this service. The patient expressed understanding and verbally consented to this telephonic visit.    Interactive audio and video telecommunications were attempted between this provider and patient, however failed, due to patient having technical difficulties OR patient did not have access to video capability.  We continued and completed visit with audio only.     Vital signs may be patient reported or missing.  Subjective:   James Cardenas is a 80 y.o. male who presents for Medicare Annual/Subsequent preventive examination.  Review of Systems     Cardiac Risk Factors include: advanced age (>50men, >76 women);hypertension;male gender     Objective:    Today's Vitals   01/28/21 1016  Weight: 180 lb (81.6 kg)  Height: 6\' 3"  (1.905 m)   Body mass index is 22.5 kg/m.  Advanced Directives 01/28/2021 11/24/2020 02/14/2020 01/23/2020 08/14/2019 01/20/2019 01/20/2019  Does Patient Have a Medical Advance Directive? Yes No Yes Yes Yes Yes Yes  Type of Paramedic of [place];Living will - Living will;Healthcare Power of [place];Living will Living will;Healthcare Power of [place];Living will [place]  Does patient want to make changes to medical advance directive? - - - - - - No - Patient declined  Copy of [place] in Chart? No - copy requested - No - copy  requested No - copy requested No - copy requested - No - copy requested    Current Medications (verified) Outpatient Encounter Medications as of 01/28/2021  Medication Sig   Alpha-Lipoic Acid 600 MG CAPS Take 600 mg by mouth every morning.    ALPRAZolam (XANAX) 0.5 MG tablet TAKE 1 TABLET BY MOUTH TWICE A DAY AS NEEDED FOR ANXIETY   amoxicillin (AMOXIL) 500 MG capsule Take 4 capsules (2,000 mg total) by mouth daily as needed (before dental procedure).   Azelaic Acid 15 % cream Apply 1 application topically daily. After skin is thoroughly washed and patted dry, gently but thoroughly massage a thin film of azelaic acid cream into the affected area once daily   Carboxymethylcellul-Glycerin (LUBRICATING EYE DROPS OP) Place 1 drop into both eyes 2 (two) times a day.   cholecalciferol (VITAMIN D3) 25 MCG (1000 UT) tablet Take 1,000 Units by mouth daily.   ELIQUIS 5 MG TABS tablet SMARTSIG:1 Tablet(s) By Mouth Morning-Night   Multiple Vitamin (MULTIVITAMIN WITH MINERALS) TABS tablet Take 1 tablet by mouth daily in the afternoon. Centrum   OVER THE COUNTER MEDICATION Take 1 tablet by mouth at bedtime as needed (sleep). [facility]NC PREVENTIVE NUTRITION TRI-SLEEP   polyethylene glycol (MIRALAX / GLYCOLAX) packet Take 17 g by mouth 2 (two) times daily as needed (constipation).    rosuvastatin (CRESTOR) 10 MG tablet TAKE 1 TABLET BY MOUTH EVERY DAY   tretinoin (RETIN-A) 0.1 % cream Apply 1 application topically at bedtime.    triamcinolone cream (KENALOG) 0.1 % APPLY TWICE DAILY TO AFFECTED RASH AS NEEDED   cloNIDine (CATAPRES) 0.1 MG tablet Take 1 tablet (0.1 mg total)  by mouth every 8 (eight) hours as needed (for systolic blood pressure (top number) greater than or equal to 160.). (Patient not taking: Reported on 01/28/2021)   diltiazem (CARDIZEM CD) 120 MG 24 hr capsule Take 1 capsule (120 mg total) by mouth daily.   furosemide (LASIX) 20 MG tablet Take 1 tablet (20 mg total) by mouth daily as needed. (Patient  not taking: No sig reported)   losartan (COZAAR) 50 MG tablet Take 1 tablet (50 mg total) by mouth daily as needed (For pressure >140). (Patient not taking: Reported on 01/28/2021)   meclizine (ANTIVERT) 25 MG tablet Take 1 tablet (25 mg total) by mouth 3 (three) times daily as needed for dizziness. (Patient not taking: No sig reported)   traMADol (ULTRAM) 50 MG tablet Take 1 tablet (50 mg total) by mouth every 12 (twelve) hours as needed. (Patient not taking: Reported on 01/28/2021)   No facility-administered encounter medications on file as of 01/28/2021.    Allergies (verified) Nickel   History: Past Medical History:  Diagnosis Date   Anxiety    Arthritis of knee    Atypical atrial flutter ([place])    a. Dx 12/15/2018. CHA2DS2VASc 3-->eliquis.   Carotid artery disease (HCC)    Constipation    due to Myrbetriq-     Hearing loss    wears hearing aids   Hypertension    Neuropathy    Nonobstructive Coronary artery disease    a. 07/2018 Cath: LMN nl, LAD/LCX/RCA no significant dzs. EF 55-65%.   S/P TAVR (transcatheter aortic valve replacement)    a. 09/2018 TAVR: 26 mm Edwards Sapien THV via the TF approach; b. 10/2018 Echo: EF 55-60%. Nl RV fxn. RVSP 20.4mmHg. Mild BAE. No AI or perivalvular leak.   Severe aortic stenosis    Past Surgical History:  Procedure Laterality Date   CARDIOVERSION N/A 01/20/2019   Procedure: CARDIOVERSION;  Surgeon: Minna Merritts, MD;  Location: [facility];  Service: Cardiovascular;  Laterality: N/A;   COLONOSCOPY     EYE SURGERY Bilateral    cataract   HERNIA REPAIR Left    Inguinal   NECK SURGERY  2011   Cervical Fusion   RIGHT HEART CATH AND CORONARY ANGIOGRAPHY N/A 07/28/2018   Procedure: RIGHT HEART CATH AND CORONARY ANGIOGRAPHY;  Surgeon: Minna Merritts, MD;  Location: [facility];  Service: Cardiovascular;  Laterality: N/A;   TEE WITHOUT CARDIOVERSION N/A 09/13/2018   Procedure: TRANSESOPHAGEAL ECHOCARDIOGRAM (TEE);  Surgeon: Sherren Mocha, MD;  Location: [facility];  Service: Open Heart Surgery;  Laterality: N/A;   TRANSCATHETER AORTIC VALVE REPLACEMENT, TRANSFEMORAL  09/13/2018   TRANSCATHETER AORTIC VALVE REPLACEMENT, TRANSFEMORAL N/A 09/13/2018   Procedure: TRANSCATHETER AORTIC VALVE REPLACEMENT, TRANSFEMORAL;  Surgeon: Sherren Mocha, MD;  Location: [facility];  Service: Open Heart Surgery;  Laterality: N/A;   Family History  Problem Relation Age of Onset   Thyroid disease Mother    Lung cancer Father    Pancreatic cancer Brother    Alzheimer's disease Sister    Prostate cancer Neg Hx    Bladder Cancer Neg Hx    Kidney cancer Neg Hx    Social History   Socioeconomic History   Marital status: Divorced    Spouse name: Not on file   Number of children: Not on file   Years of education: Not on file   Highest education level: Bachelor's degree (e.g., BA, AB, BS)  Occupational History   Occupation: Retired Designer, multimedia, Public affairs consultant / heat treating)  Tobacco Use   Smoking status: Never   Smokeless tobacco: Never  Vaping Use   Vaping Use: Never used  Substance and Sexual Activity   Alcohol use: Yes    Alcohol/week: 7.0 standard drinks    Types: 1 Cans of beer, 6 Glasses of wine per week   Drug use: No   Sexual activity: Yes  Other Topics Concern   Not on file  Social History Narrative   Not on file   Social Determinants of Health   Financial Resource Strain: Low Risk    Difficulty of Paying Living Expenses: Not hard at all  Food Insecurity: No Food Insecurity   Worried About Charity fundraiser in the Last Year: Never true   Ran Out of Food in the Last Year: Never true  Transportation Needs: No Transportation Needs   Lack of Transportation (Medical): No   Lack of Transportation (Non-Medical): No  Physical Activity: Inactive   Days of Exercise per Week: 0 days   Minutes of Exercise per Session: 0 min  Stress: No Stress Concern Present   Feeling of Stress : Not at all  Social Connections: Not on file     Tobacco Counseling Counseling given: Not Answered   Clinical Intake:  Pre-visit preparation completed: Yes  Pain : No/denies pain     Nutritional Status: BMI of 19-24  Normal Nutritional Risks: None Diabetes: No  How often do you need to have someone help you when you read instructions, pamphlets, or other written materials from your doctor or pharmacy?: 1 - Never What is the last grade level you completed in school?: bachelors degree  Diabetic? no  Interpreter Needed?: No  Information entered by :: NAllen LPN   Activities of Daily Living In your present state of health, do you have any difficulty performing the following activities: 01/28/2021  Hearing? Y  Comment high pitches, has hearing aides that wears sometimes  Vision? N  Difficulty concentrating or making decisions? N  Walking or climbing stairs? N  Dressing or bathing? N  Doing errands, shopping? N  Preparing Food and eating ? N  Using the Toilet? N  In the past six months, have you accidently leaked urine? Y  Do you have problems with loss of bowel control? N  Managing your Medications? N  Managing your Finances? N  Housekeeping or managing your Housekeeping? N  Some recent data might be hidden    Patient Care Team: Olin Hauser, DO as PCP - General (Family Medicine) Minna Merritts, MD as PCP - Cardiology (Cardiology) Renata Caprice as Physician Assistant (Orthopedic Surgery) Nickie Retort, MD (Inactive) as Consulting Physician (Urology) Gardiner Barefoot, DPM as Consulting Physician (Podiatry) Stark Klein Bing Neighbors, NP as Nurse Practitioner (Neurology)  Indicate any recent Medical Services you may have received from other than [facility] providers in the past year (date may be approximate).     Assessment:   This is a routine wellness examination for James Cardenas.  Hearing/Vision screen Vision Screening - Comments:: Regular eye exams, [facility]  Dietary issues  and exercise activities discussed: Current Exercise Habits: The patient does not participate in regular exercise at present   Goals Addressed             This Visit's Progress    Patient Stated       01/28/2021, maintain health he has, wants to improve neuropathy       Depression Screen [facility] 2/9 Scores 01/28/2021 09/25/2020 03/25/2020 03/25/2020 01/23/2020 09/22/2019  11/29/2018  PHQ - 2 Score 0 1 0 0 0 0 0  PHQ- 9 Score - 1 - - - - 0    Fall Risk Fall Risk  01/28/2021 09/25/2020 03/25/2020 01/23/2020 09/22/2019  Falls in the past year? 0 0 0 0 0  Number falls in past yr: - 0 0 - 0  Injury with Fall? - 0 0 - 0  Risk for fall due to : Medication side effect - - Medication side effect -  Follow up Falls evaluation completed;Education provided;Falls prevention discussed Falls evaluation completed Falls evaluation completed Falls evaluation completed;Education provided;Falls prevention discussed Falls evaluation completed    FALL RISK PREVENTION PERTAINING TO THE HOME:  Any stairs in or around the home? No  If so, are there any without handrails?  N/a Home free of loose throw rugs in walkways, pet beds, electrical cords, etc? Yes  Adequate lighting in your home to reduce risk of falls? Yes   ASSISTIVE DEVICES UTILIZED TO PREVENT FALLS:  Life alert? No  Use of a cane, walker or w/c? No  Grab bars in the bathroom? Yes  Shower chair or bench in shower? No  Elevated toilet seat or a handicapped toilet? Yes   TIMED UP AND GO:  Was the test performed? No .      Cognitive Function:     6CIT Screen 01/28/2021 01/23/2020 11/23/2017 09/29/2016  What Year? 0 points 0 points 0 points 0 points  What month? 0 points 0 points 0 points 0 points  What time? 0 points 0 points 0 points 0 points  Count back from 20 0 points 0 points 0 points 0 points  Months in reverse 0 points 2 points 0 points 0 points  Repeat phrase 0 points 2 points 2 points 0 points  Total Score 0 4 2 0     Immunizations Immunization History  Administered Date(s) Administered   Influenza, High Dose Seasonal PF 02/12/2017, 12/31/2017, 01/06/2021   Influenza,inj,Quad PF,6+ Mos 01/05/2019   Influenza-Unspecified 11/12/2015, 01/19/2020   Moderna SARS-COV2 Booster Vaccination 01/06/2021   PFIZER(Purple Top)SARS-COV-2 Vaccination 05/03/2019, 06/06/2019, 02/22/2020   Pneumococcal Conjugate-13 07/02/2016, 12/31/2017   Pneumococcal Polysaccharide-23 05/24/2008   Td 06/19/2020   Tetanus 08/01/2010   Zoster, Live 04/13/2009    TDAP status: Up to date  Flu Vaccine status: Up to date  Pneumococcal vaccine status: Up to date  Covid-19 vaccine status: Completed vaccines  Qualifies for Shingles Vaccine? Yes   Zostavax completed Yes   Shingrix Completed?: needs second dose  Screening Tests Health Maintenance  Topic Date Due   Zoster Vaccines- Shingrix (2 of 2) 02/27/2021   COVID-19 Vaccine (5 - Booster for Pfizer series) 05/08/2021   TETANUS/TDAP  06/20/2030   INFLUENZA VACCINE  Completed   Hepatitis C Screening  Completed   HPV VACCINES  Aged Out    Health Maintenance  There are no preventive care reminders to display for this patient.   Colorectal cancer screening: No longer required.   Lung Cancer Screening: (Low Dose CT Chest recommended if Age 39-80 years, 30 pack-year currently smoking OR have quit w/in 15years.) does not qualify.   Lung Cancer Screening Referral: no  Additional Screening:  Hepatitis C Screening: does qualify; Completed 07/25/2018  Vision Screening: Recommended annual ophthalmology exams for early detection of glaucoma and other disorders of the eye. Is the patient up to date with their annual eye exam?  Yes  Who is the provider or what is the name of the office in which  the patient attends annual eye exams? [facility] If pt is not established with a provider, would they like to be referred to a provider to establish care? No .   Dental  Screening: Recommended annual dental exams for proper oral hygiene  Community Resource Referral / Chronic Care Management: CRR required this visit?  No   CCM required this visit?  No      Plan:     I have personally reviewed and noted the following in the patient's chart:   Medical and social history Use of alcohol, tobacco or illicit drugs  Current medications and supplements including opioid prescriptions. Patient is not currently taking opioid prescriptions. Functional ability and status Nutritional status Physical activity Advanced directives List of other physicians Hospitalizations, surgeries, and ER visits in previous 12 months Vitals Screenings to include cognitive, depression, and falls Referrals and appointments  In addition, I have reviewed and discussed with patient certain preventive protocols, quality metrics, and best practice recommendations. A written personalized care plan for preventive services as well as general preventive health recommendations were provided to patient.     Kellie Simmering, LPN   24/23/5361   Nurse Notes:

## 2021-01-28 NOTE — Patient Instructions (Signed)
James Cardenas , Thank you for taking time to come for your Medicare Wellness Visit. I appreciate your ongoing commitment to your health goals. Please review the following plan we discussed and let me know if I can assist you in the future.   Screening recommendations/referrals: Colonoscopy: not required Recommended yearly ophthalmology/optometry visit for glaucoma screening and checkup Recommended yearly dental visit for hygiene and checkup  Vaccinations: Influenza vaccine: completed 01/06/2021 Pneumococcal vaccine: completed 12/31/2017 Tdap vaccine: completed 06/19/2020, due 06/20/2030 Shingles vaccine: needs second dose   Covid-19:  01/06/2021, 02/22/2020, 06/06/2019, 05/03/2019  Advanced directives: Please bring a copy of your POA (Power of Attorney) and/or Living Will to your next appointment.   Conditions/risks identified: none  Next appointment: Follow up in one year for your annual wellness visit.   Preventive Care 79 Years and Older, Male Preventive care refers to lifestyle choices and visits with your health care provider that can promote health and wellness. What does preventive care include? A yearly physical exam. This is also called an annual well check. Dental exams once or twice a year. Routine eye exams. Ask your health care provider how often you should have your eyes checked. Personal lifestyle choices, including: Daily care of your teeth and gums. Regular physical activity. Eating a healthy diet. Avoiding tobacco and drug use. Limiting alcohol use. Practicing safe sex. Taking low doses of aspirin every day. Taking vitamin and mineral supplements as recommended by your health care provider. What happens during an annual well check? The services and screenings done by your health care provider during your annual well check will depend on your age, overall health, lifestyle risk factors, and family history of disease. Counseling  Your health care provider may ask you  questions about your: Alcohol use. Tobacco use. Drug use. Emotional well-being. Home and relationship well-being. Sexual activity. Eating habits. History of falls. Memory and ability to understand (cognition). Work and work Statistician. Screening  You may have the following tests or measurements: Height, weight, and BMI. Blood pressure. Lipid and cholesterol levels. These may be checked every 5 years, or more frequently if you are over 52 years old. Skin check. Lung cancer screening. You may have this screening every year starting at age 78 if you have a 30-pack-year history of smoking and currently smoke or have quit within the past 15 years. Fecal occult blood test (FOBT) of the stool. You may have this test every year starting at age 90. Flexible sigmoidoscopy or colonoscopy. You may have a sigmoidoscopy every 5 years or a colonoscopy every 10 years starting at age 92. Prostate cancer screening. Recommendations will vary depending on your family history and other risks. Hepatitis C blood test. Hepatitis B blood test. Sexually transmitted disease (STD) testing. Diabetes screening. This is done by checking your blood sugar (glucose) after you have not eaten for a while (fasting). You may have this done every 1-3 years. Abdominal aortic aneurysm (AAA) screening. You may need this if you are a current or former smoker. Osteoporosis. You may be screened starting at age 57 if you are at high risk. Talk with your health care provider about your test results, treatment options, and if necessary, the need for more tests. Vaccines  Your health care provider may recommend certain vaccines, such as: Influenza vaccine. This is recommended every year. Tetanus, diphtheria, and acellular pertussis (Tdap, Td) vaccine. You may need a Td booster every 10 years. Zoster vaccine. You may need this after age 96. Pneumococcal 13-valent conjugate (PCV13) vaccine. One  dose is recommended after age  21. Pneumococcal polysaccharide (PPSV23) vaccine. One dose is recommended after age 20. Talk to your health care provider about which screenings and vaccines you need and how often you need them. This information is not intended to replace advice given to you by your health care provider. Make sure you discuss any questions you have with your health care provider. Document Released: 04/26/2015 Document Revised: 12/18/2015 Document Reviewed: 01/29/2015 Elsevier Interactive Patient Education  2017 [place] Prevention in the Home Falls can cause injuries. They can happen to people of all ages. There are many things you can do to make your home safe and to help prevent falls. What can I do on the outside of my home? Regularly fix the edges of walkways and driveways and fix any cracks. Remove anything that might make you trip as you walk through a door, such as a raised step or threshold. Trim any bushes or trees on the path to your home. Use bright outdoor lighting. Clear any walking paths of anything that might make someone trip, such as rocks or tools. Regularly check to see if handrails are loose or broken. Make sure that both sides of any steps have handrails. Any raised decks and porches should have guardrails on the edges. Have any leaves, snow, or ice cleared regularly. Use sand or salt on walking paths during winter. Clean up any spills in your garage right away. This includes oil or grease spills. What can I do in the bathroom? Use night lights. Install grab bars by the toilet and in the tub and shower. Do not use towel bars as grab bars. Use non-skid mats or decals in the tub or shower. If you need to sit down in the shower, use a plastic, non-slip stool. Keep the floor dry. Clean up any water that spills on the floor as soon as it happens. Remove soap buildup in the tub or shower regularly. Attach bath mats securely with double-sided non-slip rug tape. Do not have throw  rugs and other things on the floor that can make you trip. What can I do in the bedroom? Use night lights. Make sure that you have a light by your bed that is easy to reach. Do not use any sheets or blankets that are too big for your bed. They should not hang down onto the floor. Have a firm chair that has side arms. You can use this for support while you get dressed. Do not have throw rugs and other things on the floor that can make you trip. What can I do in the kitchen? Clean up any spills right away. Avoid walking on wet floors. Keep items that you use a lot in easy-to-reach places. If you need to reach something above you, use a strong step stool that has a grab bar. Keep electrical cords out of the way. Do not use floor polish or wax that makes floors slippery. If you must use wax, use non-skid floor wax. Do not have throw rugs and other things on the floor that can make you trip. What can I do with my stairs? Do not leave any items on the stairs. Make sure that there are handrails on both sides of the stairs and use them. Fix handrails that are broken or loose. Make sure that handrails are as long as the stairways. Check any carpeting to make sure that it is firmly attached to the stairs. Fix any carpet that is loose or worn.  Avoid having throw rugs at the top or bottom of the stairs. If you do have throw rugs, attach them to the floor with carpet tape. Make sure that you have a light switch at the top of the stairs and the bottom of the stairs. If you do not have them, ask someone to add them for you. What else can I do to help prevent falls? Wear shoes that: Do not have high heels. Have rubber bottoms. Are comfortable and fit you well. Are closed at the toe. Do not wear sandals. If you use a stepladder: Make sure that it is fully opened. Do not climb a closed stepladder. Make sure that both sides of the stepladder are locked into place. Ask someone to hold it for you, if  possible. Clearly mark and make sure that you can see: Any grab bars or handrails. First and last steps. Where the edge of each step is. Use tools that help you move around (mobility aids) if they are needed. These include: Canes. Walkers. Scooters. Crutches. Turn on the lights when you go into a dark area. Replace any light bulbs as soon as they burn out. Set up your furniture so you have a clear path. Avoid moving your furniture around. If any of your floors are uneven, fix them. If there are any pets around you, be aware of where they are. Review your medicines with your doctor. Some medicines can make you feel dizzy. This can increase your chance of falling. Ask your doctor what other things that you can do to help prevent falls. This information is not intended to replace advice given to you by your health care provider. Make sure you discuss any questions you have with your health care provider. Document Released: 01/24/2009 Document Revised: 09/05/2015 Document Reviewed: 05/04/2014 Elsevier Interactive Patient Education  2017 [employer].

## 2021-02-03 ENCOUNTER — Encounter: Payer: Self-pay | Admitting: Podiatry

## 2021-02-03 ENCOUNTER — Other Ambulatory Visit: Payer: Self-pay

## 2021-02-03 ENCOUNTER — Ambulatory Visit (INDEPENDENT_AMBULATORY_CARE_PROVIDER_SITE_OTHER): Payer: Medicare Other | Admitting: Podiatry

## 2021-02-03 DIAGNOSIS — I872 Venous insufficiency (chronic) (peripheral): Secondary | ICD-10-CM | POA: Diagnosis not present

## 2021-02-03 DIAGNOSIS — B351 Tinea unguium: Secondary | ICD-10-CM

## 2021-02-03 DIAGNOSIS — G629 Polyneuropathy, unspecified: Secondary | ICD-10-CM

## 2021-02-03 DIAGNOSIS — M79676 Pain in unspecified toe(s): Secondary | ICD-10-CM | POA: Diagnosis not present

## 2021-02-03 NOTE — Progress Notes (Signed)
This patient returns to my office for at risk foot care.  This patient requires this care by a professional since this patient will be at risk due to having neuropathy.   This patient is unable to cut nails himself since the patient cannot reach his nails.These nails are painful walking and wearing shoes.  This patient presents for at risk foot care today.  General Appearance  Alert, conversant and in no acute stress.  Vascular  Dorsalis pedis and posterior tibial  pulses are palpable  bilaterally.  Capillary return is within normal limits  bilaterally. Temperature is within normal limits  bilaterally.  Neurologic  Senn-Weinstein monofilament wire test within normal limits  bilaterally. Muscle power within normal limits bilaterally.  Nails Thick disfigured discolored nails with subungual debris  from hallux to fifth toes bilaterally. No evidence of bacterial infection or drainage bilaterally.  Orthopedic  No limitations of motion  feet .  No crepitus or effusions noted.  No bony pathology or digital deformities noted.    Cavus foot  B/L.  Skin  normotropic skin with no porokeratosis noted bilaterally.  No signs of infections or ulcers noted.     Onychomycosis  Pain in right toes  Pain in left toes  Consent was obtained for treatment procedures.   Mechanical debridement of nails 1-5  bilaterally performed with a nail nipper.  Filed with dremel without incident.   Return office visit   prn                   Told patient to return for periodic foot care and evaluation due to potential at risk complications.   Gardiner Barefoot DPM

## 2021-02-04 ENCOUNTER — Telehealth: Payer: Self-pay | Admitting: Podiatry

## 2021-02-04 NOTE — Telephone Encounter (Signed)
Refurbished orthotics in [place] to be taken to [facility].. pt aware and will pick them up on 11.8 in [place] office.

## 2021-02-05 ENCOUNTER — Telehealth: Payer: Self-pay | Admitting: Podiatry

## 2021-02-05 NOTE — Telephone Encounter (Signed)
Pt left message yesterday afternoon about the refurbished orthotics. He is not going to be able to pick them up on 11.8 as he thought and the next time he maybe able to would be 11.28. Pt asked if we could mail to him.   I discussed with Dr Prudence Davidson and Dr Prudence Davidson offered to drop it off at his house if it was close to the office.  I called pt back and asked where he lived and he lives in [place] which is not close so I told pt I would mail them to his home.I verified the address. I did tell pt it would not be until later this week as I had sent the orthotics to the [place] office. He said thank you for all the assistance and working with him.

## 2021-02-06 ENCOUNTER — Telehealth: Payer: Self-pay | Admitting: Oncology

## 2021-02-06 NOTE — Telephone Encounter (Signed)
Pt called in to change his visit appt to vit. Appt.  Also wants to cancel lab appt, Please call at [phone]

## 2021-02-06 NOTE — Telephone Encounter (Signed)
Spoke to pt and he states that he will be moving to [place]. He wants to cancel lab and have a virtual visit to further discuss diagnosis  and ask other questions that he has so that when he establishes care in [place], he knows what to do regarding diagnosis.   Please cancel lab and change appt to virtual (same day ok, but it will have to be in the afternoon).

## 2021-02-06 NOTE — Telephone Encounter (Signed)
Pt scheduled for MD visit on 11/10, wanting to switch to virtual, and requesting to cancel labs on 11/3 (cbc,cmp, mult myeloma, light chain). Please advise.

## 2021-02-07 ENCOUNTER — Telehealth: Payer: Self-pay | Admitting: *Deleted

## 2021-02-07 NOTE — Telephone Encounter (Signed)
Please cancel lab on 11/3 and change MD appt to Mychart. Informed patient that Mychart visits are done in the afternoon and he voiced understanding.

## 2021-02-07 NOTE — Telephone Encounter (Signed)
Patient informed, samples left upfront.

## 2021-02-07 NOTE — Telephone Encounter (Signed)
Please change appt to Mychart and move to the afternoon (ok to DB) thanks.

## 2021-02-07 NOTE — Telephone Encounter (Signed)
Patient called Triage line regarding urinary frequency. Denies any other symptoms. He discontinued Myrbetriq 4 months ago due to cost and frequent constipation. He has been having ongoing urinary frequency-would like to try an alternate medication. He will be moving to [place] at the end of November.

## 2021-02-07 NOTE — Telephone Encounter (Signed)
Can give Gemtesa samples #28 tabs

## 2021-02-10 ENCOUNTER — Other Ambulatory Visit: Payer: Self-pay | Admitting: Urology

## 2021-02-10 MED ORDER — GEMTESA 75 MG PO TABS: 75.0000 mg | ORAL_TABLET | Freq: Every day | ORAL | 0 refills | Status: DC

## 2021-02-10 NOTE — Addendum Note (Signed)
Addended by: Alvera Novel on: 02/10/2021 09:42 AM   Modules accepted: Orders

## 2021-02-10 NOTE — Telephone Encounter (Signed)
Pt called stating he is unable to get a ride to [place] and the cab would cost him too much. Pt asked for [place] to send in a prescription to his pharmacy.

## 2021-02-12 MED ORDER — GEMTESA 75 MG PO TABS: 75.0000 mg | ORAL_TABLET | Freq: Every day | ORAL | 0 refills | Status: DC

## 2021-02-12 NOTE — Telephone Encounter (Signed)
LMOM informed patient that James Cardenas was sent to the pharmacy and Myrbetriq was discontinued.

## 2021-02-12 NOTE — Telephone Encounter (Signed)
Patient called the office back today.  He is requesting a prescription for Gemtesa to [facility] [place].  He understands that his insurance may not cover it, but he does not want the prescription for Myrbetriq.

## 2021-02-12 NOTE — Addendum Note (Signed)
Addended by: Kyra Manges on: 02/12/2021 01:29 PM   Modules accepted: Orders

## 2021-02-13 ENCOUNTER — Other Ambulatory Visit: Payer: Medicare Other

## 2021-02-13 ENCOUNTER — Other Ambulatory Visit: Payer: Self-pay | Admitting: Urology

## 2021-02-18 ENCOUNTER — Encounter: Payer: Self-pay | Admitting: Family Medicine

## 2021-02-18 ENCOUNTER — Ambulatory Visit (INDEPENDENT_AMBULATORY_CARE_PROVIDER_SITE_OTHER): Payer: Medicare Other | Admitting: Family Medicine

## 2021-02-18 ENCOUNTER — Other Ambulatory Visit: Payer: Self-pay

## 2021-02-18 VITALS — BP 157/81 | HR 67 | Ht 74.0 in | Wt 177.0 lb

## 2021-02-18 DIAGNOSIS — N401 Enlarged prostate with lower urinary tract symptoms: Secondary | ICD-10-CM | POA: Diagnosis not present

## 2021-02-18 DIAGNOSIS — X32XXXA Exposure to sunlight, initial encounter: Secondary | ICD-10-CM | POA: Diagnosis not present

## 2021-02-18 DIAGNOSIS — I5032 Chronic diastolic (congestive) heart failure: Secondary | ICD-10-CM | POA: Diagnosis not present

## 2021-02-18 DIAGNOSIS — F418 Other specified anxiety disorders: Secondary | ICD-10-CM | POA: Diagnosis not present

## 2021-02-18 DIAGNOSIS — I700 Atherosclerosis of aorta: Secondary | ICD-10-CM | POA: Diagnosis not present

## 2021-02-18 DIAGNOSIS — D2271 Melanocytic nevi of right lower limb, including hip: Secondary | ICD-10-CM | POA: Diagnosis not present

## 2021-02-18 DIAGNOSIS — I272 Pulmonary hypertension, unspecified: Secondary | ICD-10-CM

## 2021-02-18 DIAGNOSIS — G609 Hereditary and idiopathic neuropathy, unspecified: Secondary | ICD-10-CM | POA: Diagnosis not present

## 2021-02-18 DIAGNOSIS — I872 Venous insufficiency (chronic) (peripheral): Secondary | ICD-10-CM | POA: Diagnosis not present

## 2021-02-18 DIAGNOSIS — D2261 Melanocytic nevi of right upper limb, including shoulder: Secondary | ICD-10-CM | POA: Diagnosis not present

## 2021-02-18 DIAGNOSIS — R350 Frequency of micturition: Secondary | ICD-10-CM

## 2021-02-18 DIAGNOSIS — R7309 Other abnormal glucose: Secondary | ICD-10-CM

## 2021-02-18 DIAGNOSIS — Z85828 Personal history of other malignant neoplasm of skin: Secondary | ICD-10-CM | POA: Diagnosis not present

## 2021-02-18 DIAGNOSIS — D2262 Melanocytic nevi of left upper limb, including shoulder: Secondary | ICD-10-CM | POA: Diagnosis not present

## 2021-02-18 DIAGNOSIS — I10 Essential (primary) hypertension: Secondary | ICD-10-CM

## 2021-02-18 DIAGNOSIS — L570 Actinic keratosis: Secondary | ICD-10-CM | POA: Diagnosis not present

## 2021-02-18 MED ORDER — ALPRAZOLAM 0.5 MG PO TABS: ORAL_TABLET | ORAL | 1 refills | Status: AC

## 2021-02-18 NOTE — Progress Notes (Signed)
Subjective:    Patient ID: ABED SCHAR, male    DOB: 1941-01-14, 80 y.o.   MRN: [id number]  LORAN FLEET is a 80 y.o. male presenting on 02/18/2021 for Medicare Wellness   HPI  Upcoming stressors with move in December to independent living facility in [place]. His family will be closer to him at that point.  Followed by Dr Prudence [facility] for foot pain  Can have elevated BP temporarily due to pain / anxiety  Doing well with Alprazolam PRN Needs 1 order now for 60 pills and he may fill the refill in [place].  BPH / Bladder Dysfunction Followed by [facility] He was off Myrbetriq due to side effect of constipation. He was taking Miralax more regularly for period of time. He was hesitant or cautious on this. Then bladder started to reset again to similar problems. He contacted Urology and they were going to switch to [place] but high cost. They were going to restart Myrbetriq. He is now on this and also taking the Miralax x 2 packet per day, then occasionally he takes higher dose.   CHF / Heart Failure, preserved ejection fraction Interval updates - his weight has gone down, with diet and lifestyle changes, he said that he was monitoring  Limited fluid intake Increased urination He was able to lose some weight with diuretic, leveled off at 188 lbs He is followed by Cardiology Has Furosemide PRN   He feels occasional lightheaded dizziness dehydration symptoms. He has had some side effect of eliquis as well similar  Aorta Atherosclerosis On prior imaging On Statin therapy.   Peripheral Neuropathy, Chronic Previous followed by Vascular / Neurology Off Gabapentin. On Alpha Lipoic Acid with improvement.   S/p TAVR / Aortic Stenosis / regurgitation, Moderate / Heart Murmur Followed by Cardiothoracic Surgery/Cardiology   Depression screen [facility] 2/9 02/18/2021 01/28/2021 09/25/2020  Decreased Interest 0 0 1  Down, Depressed, Hopeless 0 0 0  PHQ - 2 Score 0 0 1  Altered sleeping 0  - 0  Tired, decreased energy 0 - 0  Change in appetite 0 - 0  Feeling bad or failure about yourself  0 - 0  Trouble concentrating 0 - 0  Moving slowly or fidgety/restless 0 - 0  Suicidal thoughts 0 - 0  PHQ-9 Score 0 - 1  Difficult doing work/chores Not difficult at all - Not difficult at all  Some recent data might be hidden    Past Medical History:  Diagnosis Date   Anxiety    Arthritis of knee    Atypical atrial flutter ([place])    a. Dx 12/15/2018. CHA2DS2VASc 3-->eliquis.   Carotid artery disease (HCC)    Constipation    due to Myrbetriq-     Hearing loss    wears hearing aids   Hypertension    Neuropathy    Nonobstructive Coronary artery disease    a. 07/2018 Cath: LMN nl, LAD/LCX/RCA no significant dzs. EF 55-65%.   S/P TAVR (transcatheter aortic valve replacement)    a. 09/2018 TAVR: 26 mm Edwards Sapien THV via the TF approach; b. 10/2018 Echo: EF 55-60%. Nl RV fxn. RVSP 20.50mHg. Mild BAE. No AI or perivalvular leak.   Severe aortic stenosis    Past Surgical History:  Procedure Laterality Date   CARDIOVERSION N/A 01/20/2019   Procedure: CARDIOVERSION;  Surgeon: GMinna Merritts MD;  Location: [facility];  Service: Cardiovascular;  Laterality: N/A;   COLONOSCOPY     EYE SURGERY Bilateral  cataract   HERNIA REPAIR Left    Inguinal   NECK SURGERY  2011   Cervical Fusion   RIGHT HEART CATH AND CORONARY ANGIOGRAPHY N/A 07/28/2018   Procedure: RIGHT HEART CATH AND CORONARY ANGIOGRAPHY;  Surgeon: Minna Merritts, MD;  Location: [facility];  Service: Cardiovascular;  Laterality: N/A;   TEE WITHOUT CARDIOVERSION N/A 09/13/2018   Procedure: TRANSESOPHAGEAL ECHOCARDIOGRAM (TEE);  Surgeon: Sherren Mocha, MD;  Location: [facility];  Service: Open Heart Surgery;  Laterality: N/A;   TRANSCATHETER AORTIC VALVE REPLACEMENT, TRANSFEMORAL  09/13/2018   TRANSCATHETER AORTIC VALVE REPLACEMENT, TRANSFEMORAL N/A 09/13/2018   Procedure: TRANSCATHETER AORTIC VALVE REPLACEMENT,  TRANSFEMORAL;  Surgeon: Sherren Mocha, MD;  Location: [facility];  Service: Open Heart Surgery;  Laterality: N/A;   Social History   Socioeconomic History   Marital status: Divorced    Spouse name: Not on file   Number of children: Not on file   Years of education: Not on file   Highest education level: Bachelor's degree (e.g., BA, AB, BS)  Occupational History   Occupation: Retired Designer, multimedia, Public affairs consultant / heat treating)  Tobacco Use   Smoking status: Never   Smokeless tobacco: Never  Vaping Use   Vaping Use: Never used  Substance and Sexual Activity   Alcohol use: Yes    Alcohol/week: 7.0 standard drinks    Types: 6 Glasses of wine, 1 Cans of beer per week    Comment: OCCASIONAL GLASS OF WINE AND A BEER   Drug use: No   Sexual activity: Yes  Other Topics Concern   Not on file  Social History Narrative   Not on file   Social Determinants of Health   Financial Resource Strain: Low Risk    Difficulty of Paying Living Expenses: Not hard at all  Food Insecurity: No Food Insecurity   Worried About Charity fundraiser in the Last Year: Never true   Ran Out of Food in the Last Year: Never true  Transportation Needs: No Transportation Needs   Lack of Transportation (Medical): No   Lack of Transportation (Non-Medical): No  Physical Activity: Inactive   Days of Exercise per Week: 0 days   Minutes of Exercise per Session: 0 min  Stress: No Stress Concern Present   Feeling of Stress : Not at all  Social Connections: Not on file  Intimate Partner Violence: Not on file   Family History  Problem Relation Age of Onset   Thyroid disease Mother    Lung cancer Father    Pancreatic cancer Brother    Alzheimer's disease Sister    Prostate cancer Neg Hx    Bladder Cancer Neg Hx    Kidney cancer Neg Hx    Current Outpatient Medications on File Prior to Visit  Medication Sig   Alpha-Lipoic Acid 600 MG CAPS Take 600 mg by mouth every morning.    amoxicillin (AMOXIL) 500 MG  capsule Take 4 capsules (2,000 mg total) by mouth daily as needed (before dental procedure).   Carboxymethylcellul-Glycerin (LUBRICATING EYE DROPS OP) Place 1 drop into both eyes 2 (two) times a day.   cholecalciferol (VITAMIN D3) 25 MCG (1000 UT) tablet Take 1,000 Units by mouth daily.   ELIQUIS 5 MG TABS tablet SMARTSIG:1 Tablet(s) By Mouth Morning-Night   Multiple Vitamin (MULTIVITAMIN WITH MINERALS) TABS tablet Take 1 tablet by mouth daily in the afternoon. Centrum   OVER THE COUNTER MEDICATION Take 1 tablet by mouth at bedtime as needed (sleep). [place] PREVENTIVE NUTRITION  TRI-SLEEP   polyethylene glycol (MIRALAX / GLYCOLAX) packet Take 17 g by mouth 2 (two) times daily as needed (constipation).    rosuvastatin (CRESTOR) 10 MG tablet TAKE 1 TABLET BY MOUTH EVERY DAY   tretinoin (RETIN-A) 0.1 % cream Apply 1 application topically at bedtime.    triamcinolone cream (KENALOG) 0.1 % APPLY TWICE DAILY TO AFFECTED RASH AS NEEDED   diltiazem (CARDIZEM CD) 120 MG 24 hr capsule Take 1 capsule (120 mg total) by mouth daily.   FINACEA 15 % FOAM Apply topically daily.   meclizine (ANTIVERT) 25 MG tablet Take 1 tablet (25 mg total) by mouth 3 (three) times daily as needed for dizziness. (Patient not taking: No sig reported)   MYRBETRIQ 25 MG TB24 tablet Take 25 mg by mouth daily.   No current facility-administered medications on file prior to visit.    Review of Systems  Constitutional:  Negative for activity change, appetite change, chills, diaphoresis, fatigue and fever.  HENT:  Negative for congestion and hearing loss.   Eyes:  Negative for visual disturbance.  Respiratory:  Negative for cough, chest tightness, shortness of breath and wheezing.   Cardiovascular:  Negative for chest pain, palpitations and leg swelling.  Gastrointestinal:  Negative for abdominal pain, constipation, diarrhea, nausea and vomiting.  Genitourinary:  Negative for dysuria, frequency and hematuria.  Musculoskeletal:   Negative for arthralgias and neck pain.  Skin:  Negative for rash.  Neurological:  Negative for dizziness, weakness, light-headedness, numbness and headaches.  Hematological:  Negative for adenopathy.  Psychiatric/Behavioral:  Negative for behavioral problems, dysphoric mood and sleep disturbance.   Per HPI unless specifically indicated above     Objective:    BP (!) 157/81   Pulse 67   Ht '6\' 2"'  (1.88 m)   Wt 177 lb (80.3 kg)   SpO2 99%   BMI 22.73 kg/m   Wt Readings from Last 3 Encounters:  02/18/21 177 lb (80.3 kg)  01/28/21 180 lb (81.6 kg)  01/24/21 181 lb (82.1 kg)    Physical Exam Vitals and nursing note reviewed.  Constitutional:      General: He is not in acute distress.    Appearance: He is well-developed. He is not diaphoretic.     Comments: Well-appearing, comfortable, cooperative  HENT:     Head: Normocephalic and atraumatic.  Eyes:     General:        Right eye: No discharge.        Left eye: No discharge.     Conjunctiva/sclera: Conjunctivae normal.     Pupils: Pupils are equal, round, and reactive to light.  Neck:     Thyroid: No thyromegaly.  Cardiovascular:     Rate and Rhythm: Normal rate and regular rhythm.     Pulses: Normal pulses.     Heart sounds: Normal heart sounds. No murmur heard. Pulmonary:     Effort: Pulmonary effort is normal. No respiratory distress.     Breath sounds: Normal breath sounds. No wheezing or rales.  Abdominal:     General: Bowel sounds are normal. There is no distension.     Palpations: Abdomen is soft. There is no mass.     Tenderness: There is no abdominal tenderness.  Musculoskeletal:        General: No tenderness. Normal range of motion.     Cervical back: Normal range of motion and neck supple.     Comments: Upper / Lower Extremities: - Normal muscle tone, strength bilateral upper extremities 5/5,  lower extremities 5/5  Lymphadenopathy:     Cervical: No cervical adenopathy.  Skin:    General: Skin is warm and  dry.     Findings: No erythema or rash.  Neurological:     Mental Status: He is alert and oriented to person, place, and time.     Comments: Distal sensation intact to light touch all extremities  Psychiatric:        Mood and Affect: Mood normal.        Behavior: Behavior normal.        Thought Content: Thought content normal.     Comments: Well groomed, good eye contact, normal speech and thoughts     Results for orders placed or performed in visit on 11/04/20  COMPLETE METABOLIC PANEL WITH GFR  Result Value Ref Range   Glucose, Bld 97 65 - 99 mg/dL   BUN 16 7 - 25 mg/dL   Creat 0.87 0.70 - 1.28 mg/dL   eGFR 88 > OR = 60 mL/min/1.30m   BUN/Creatinine Ratio NOT APPLICABLE 6 - 22 (calc)   Sodium 138 135 - 146 mmol/L   Potassium 4.2 3.5 - 5.3 mmol/L   Chloride 104 98 - 110 mmol/L   CO2 25 20 - 32 mmol/L   Calcium 9.2 8.6 - 10.3 mg/dL   Total Protein 7.2 6.1 - 8.1 g/dL   Albumin 4.2 3.6 - 5.1 g/dL   Globulin 3.0 1.9 - 3.7 g/dL (calc)   AG Ratio 1.4 1.0 - 2.5 (calc)   Total Bilirubin 1.3 (H) 0.2 - 1.2 mg/dL   Alkaline phosphatase (APISO) 77 35 - 144 U/L   AST 25 10 - 35 U/L   ALT 19 9 - 46 U/L  PSA  Result Value Ref Range   PSA 3.29 < OR = 4.00 ng/mL  Lipid panel  Result Value Ref Range   Cholesterol 129 <200 mg/dL   HDL 66 > OR = 40 mg/dL   Triglycerides 47 <150 mg/dL   LDL Cholesterol (Calc) 50 mg/dL (calc)   Total CHOL/HDL Ratio 2.0 <5.0 (calc)   Non-HDL Cholesterol (Calc) 63 <130 mg/dL (calc)  Hemoglobin A1c  Result Value Ref Range   Hgb A1c MFr Bld 5.8 (H) <5.7 % of total Hgb   Mean Plasma Glucose 120 mg/dL   eAG (mmol/L) 6.6 mmol/L   I have personally reviewed the radiology report from CT 08/25/18  CLINICAL DATA:  80year old male with severe symptomatic aortic stenosis. Pre-TAVR evaluation.   EXAM: CT ANGIOGRAPHY CHEST, ABDOMEN AND PELVIS   TECHNIQUE: Multidetector CT imaging through the chest, abdomen and pelvis was performed using the standard  protocol during bolus administration of intravenous contrast. Multiplanar reconstructed images and MIPs were obtained and reviewed to evaluate the vascular anatomy.   CONTRAST:  1048mOMNIPAQUE IOHEXOL 350 MG/ML SOLN   COMPARISON:  10/18/2008 chest radiograph.   FINDINGS: CTA CHEST FINDINGS   Cardiovascular: Mild cardiomegaly. No significant pericardial effusion/thickening. Marked thickening and coarse calcification of the aortic valve. Mildly atherosclerotic nonaneurysmal thoracic aorta. Normal caliber pulmonary arteries. No central pulmonary emboli.   Mediastinum/Nodes: No discrete thyroid nodules. Unremarkable esophagus. No pathologically enlarged axillary, mediastinal or hilar lymph nodes.   Lungs/Pleura: No pneumothorax. No pleural effusion. No acute consolidative airspace disease or lung masses. Two tiny scattered solid pulmonary nodules, largest 3 mm in the subpleural apical left upper lobe (series 15/image 12).   Musculoskeletal: No aggressive appearing focal osseous lesions. Marked thoracic spondylosis.   CTA ABDOMEN AND PELVIS FINDINGS  Hepatobiliary: Normal liver with no liver mass. Normal gallbladder with no radiopaque cholelithiasis. No biliary ductal dilatation.   Pancreas: Normal, with no mass or duct dilation.   Spleen: Normal size. No mass.   Adrenals/Urinary Tract: Normal adrenals. No hydronephrosis. No contour deforming renal masses. Normal bladder.   Stomach/Bowel: Normal non-distended stomach. Normal caliber small bowel with no small bowel wall thickening. Normal appendix. Normal large bowel with no diverticulosis, large bowel wall thickening or pericolonic fat stranding.   Vascular/Lymphatic: Atherosclerotic nonaneurysmal abdominal aorta. Patent splenic and renal veins. No pathologically enlarged lymph nodes in the abdomen or pelvis.   Reproductive: Moderate prostatomegaly.   Other: No pneumoperitoneum, ascites or focal fluid collection.    Musculoskeletal: No aggressive appearing focal osseous lesions. Marked lumbar spondylosis.   VASCULAR MEASUREMENTS PERTINENT TO TAVR:   AORTA:   Minimal Aortic Diameter-17.4 x 15.9 mm   Severity of Aortic Calcification-mild   RIGHT PELVIS:   Right Common Iliac Artery -   Minimal Diameter-9.9 x 9.1 mm   Tortuosity-mild   Calcification-mild   Right External Iliac Artery -   Minimal Diameter-8.9 x 8.7 mm   Tortuosity-moderate   Calcification-none   Right Common Femoral Artery -   Minimal Diameter-9.0 x 8.3 mm   Tortuosity-mild   Calcification-minimal   LEFT PELVIS:   Left Common Iliac Artery -   Minimal Diameter-9.8 x 8.9 mm   Tortuosity-mild   Calcification-mild   Left External Iliac Artery -   Minimal Diameter-8.8 x 8.1 mm   Tortuosity-moderate   Calcification-none   Left Common Femoral Artery -   Minimal Diameter-7.7 x 6.9 mm   Tortuosity-mild   Calcification-mild   Review of the MIP images confirms the above findings.   IMPRESSION: 1. Vascular findings and measurements pertinent to potential TAVR procedure, as detailed. 2. Marked thickening and calcification of the aortic valve, compatible with the reported history of severe symptomatic aortic stenosis. 3. Mild cardiomegaly. 4. Two tiny solid pulmonary nodules, largest 3 mm. No follow-up needed if patient is low-risk (and has no known or suspected primary neoplasm). Non-contrast chest CT can be considered in 12 months if patient is high-risk. This recommendation follows the consensus statement: Guidelines for Management of Incidental Pulmonary Nodules Detected on CT Images:From the Fleischner Society 2017; published online before print (10.1148/radiol.7741423953). 5. Moderately enlarged prostate. 6. Aortic Atherosclerosis (ICD10-I70.0).     Electronically Signed   By: Ilona Sorrel M.D.   On: 08/25/2018 16:01     Assessment & Plan:   Problem List Items Addressed This Visit      Venous insufficiency of both lower extremities   Situational anxiety   Relevant Medications   ALPRAZolam (XANAX) 0.5 MG tablet   Pulmonary hypertension (HCC)   Peripheral neuropathy   Relevant Medications   ALPRAZolam (XANAX) 0.5 MG tablet   Essential hypertension - Primary   Elevated hemoglobin A1c   Chronic heart failure with preserved ejection fraction (HCC)   BPH (benign prostatic hyperplasia)   Other Visit Diagnoses     Aortic atherosclerosis ([place])           Updated Health Maintenance information Reviewed recent lab results with patient Encouraged improvement to lifestyle with diet and exercise Goal of weight loss  #Aortic Atherosclerosis Identified on imaging CT previously On Statin therapy  #Anxiety Stable chronic problem PDMP reviewed Refill Alprazolam, he may fill refill out of state x 1 if need  #peripheral neuropathy Chronic problem Working with Neuro / Podiatry Continue current therapy  He will relocate  to new PCP he is going to independent living facility in [place] to be closer to family in December  Meds ordered this encounter  Medications   ALPRAZolam (XANAX) 0.5 MG tablet    Sig: TAKE 1 TABLET BY MOUTH TWICE A DAY AS NEEDED FOR ANXIETY    Dispense:  60 tablet    Refill:  1      Follow up plan: Return if symptoms worsen or fail to improve.  [name], [facility] 02/18/2021, 11:36 AM

## 2021-02-18 NOTE — Patient Instructions (Addendum)
Thank you for coming to the office today.  Family Medicine, Internal Medicine, Geriatrics (age 79+ only), any of these "General Practitioners" can help assist you for future medical care.  Refilled Alprazolam for 60 pill count + 1 refill [facility] to [facility].  You can take the Furosemide Lasix as needed when if you need for swelling.  Okay to take the Miralax up to 2 dose per day, and then when needed can take 3 then up to 4 or more at a time for when you are needing it.   Please schedule a Follow-up Appointment to: Return if symptoms worsen or fail to improve.  If you have any other questions or concerns, please feel free to call the office or send a message through [facility]. You may also schedule an earlier appointment if necessary.  Additionally, you may be receiving a survey about your experience at our office within a few days to 1 week by e-mail or mail. We value your feedback.  [name], DO [place]

## 2021-02-20 ENCOUNTER — Other Ambulatory Visit: Payer: Self-pay

## 2021-02-20 ENCOUNTER — Ambulatory Visit: Payer: Medicare Other | Admitting: Oncology

## 2021-02-20 ENCOUNTER — Inpatient Hospital Stay: Payer: PRIVATE HEALTH INSURANCE | Attending: Oncology | Admitting: Oncology

## 2021-02-20 ENCOUNTER — Encounter: Payer: Self-pay | Admitting: Oncology

## 2021-02-20 DIAGNOSIS — D472 Monoclonal gammopathy: Secondary | ICD-10-CM | POA: Diagnosis not present

## 2021-02-20 NOTE — Progress Notes (Signed)
Patient states he is moving to [place].

## 2021-02-21 NOTE — Progress Notes (Signed)
HEMATOLOGY-ONCOLOGY TeleHEALTH VISIT PROGRESS NOTE  I connected with James Cardenas on 02/21/21  at  2:45 PM EST by video enabled telemedicine visit and verified that I am speaking with the correct person using two identifiers. I discussed the limitations, risks, security and privacy concerns of performing an evaluation and management service by telemedicine and the availability of in-person appointments. The patient expressed understanding and agreed to proceed.   Other persons participating in the visit and their role in the encounter:  None  Patient's location: Home  Provider's location: office Chief Complaint: MGUS   INTERVAL HISTORY James Cardenas is a 80 y.o. male who has above history reviewed by me today presents for follow up visit for MGUS Patient reports that he has neuropathy and not able to come to the clinic and he requests visit to be switched to virtual visit.  He also has not had a blood work done due to the same reason.  He is in the process of moving to [place], to be closer to his children.    Review of Systems  Constitutional:  Negative for appetite change, chills, fatigue, fever and unexpected weight change.  HENT:   Negative for hearing loss and voice change.   Eyes:  Negative for eye problems and icterus.  Respiratory:  Negative for chest tightness, cough and shortness of breath.   Cardiovascular:  Negative for chest pain and leg swelling.  Gastrointestinal:  Negative for abdominal distention and abdominal pain.  Endocrine: Negative for hot flashes.  Genitourinary:  Negative for difficulty urinating, dysuria and frequency.   Musculoskeletal:  Negative for arthralgias.  Skin:  Negative for itching and rash.  Neurological:  Positive for numbness. Negative for light-headedness.  Hematological:  Negative for adenopathy. Does not bruise/bleed easily.  Psychiatric/Behavioral:  Negative for confusion.    Past Medical History:  Diagnosis Date   Anxiety     Arthritis of knee    Atypical atrial flutter ([facility])    a. Dx 12/15/2018. CHA2DS2VASc 3-->eliquis.   Carotid artery disease (HCC)    Constipation    due to Myrbetriq-     Hearing loss    wears hearing aids   Hypertension    Neuropathy    Nonobstructive Coronary artery disease    a. 07/2018 Cath: LMN nl, LAD/LCX/RCA no significant dzs. EF 55-65%.   S/P TAVR (transcatheter aortic valve replacement)    a. 09/2018 TAVR: 26 mm Edwards Sapien THV via the TF approach; b. 10/2018 Echo: EF 55-60%. Nl RV fxn. RVSP 20.35mHg. Mild BAE. No AI or perivalvular leak.   Severe aortic stenosis    Past Surgical History:  Procedure Laterality Date   CARDIOVERSION N/A 01/20/2019   Procedure: CARDIOVERSION;  Surgeon: GMinna Merritts MD;  Location: [facility];  Service: Cardiovascular;  Laterality: N/A;   COLONOSCOPY     EYE SURGERY Bilateral    cataract   HERNIA REPAIR Left    Inguinal   NECK SURGERY  2011   Cervical Fusion   RIGHT HEART CATH AND CORONARY ANGIOGRAPHY N/A 07/28/2018   Procedure: RIGHT HEART CATH AND CORONARY ANGIOGRAPHY;  Surgeon: GMinna Merritts MD;  Location: [facility];  Service: Cardiovascular;  Laterality: N/A;   TEE WITHOUT CARDIOVERSION N/A 09/13/2018   Procedure: TRANSESOPHAGEAL ECHOCARDIOGRAM (TEE);  Surgeon: CSherren Mocha MD;  Location: [facility]  Service: Open Heart Surgery;  Laterality: N/A;   TRANSCATHETER AORTIC VALVE REPLACEMENT, TRANSFEMORAL  09/13/2018   TRANSCATHETER AORTIC VALVE REPLACEMENT, TRANSFEMORAL N/A 09/13/2018   Procedure:  TRANSCATHETER AORTIC VALVE REPLACEMENT, TRANSFEMORAL;  Surgeon: Sherren Mocha, MD;  Location: [facility];  Service: Open Heart Surgery;  Laterality: N/A;    Family History  Problem Relation Age of Onset   Thyroid disease Mother    Lung cancer Father    Pancreatic cancer Brother    Alzheimer's disease Sister    Prostate cancer Neg Hx    Bladder Cancer Neg Hx    Kidney cancer Neg Hx     Social History   Socioeconomic History    Marital status: Divorced    Spouse name: Not on file   Number of children: Not on file   Years of education: Not on file   Highest education level: Bachelor's degree (e.g., BA, AB, BS)  Occupational History   Occupation: Retired Designer, multimedia, Public affairs consultant / heat treating)  Tobacco Use   Smoking status: Never   Smokeless tobacco: Never  Vaping Use   Vaping Use: Never used  Substance and Sexual Activity   Alcohol use: Yes    Alcohol/week: 7.0 standard drinks    Types: 6 Glasses of wine, 1 Cans of beer per week    Comment: OCCASIONAL GLASS OF WINE AND A BEER   Drug use: No   Sexual activity: Yes  Other Topics Concern   Not on file  Social History Narrative   Not on file   Social Determinants of Health   Financial Resource Strain: Low Risk    Difficulty of Paying Living Expenses: Not hard at all  Food Insecurity: No Food Insecurity   Worried About Charity fundraiser in the Last Year: Never true   Ran Out of Food in the Last Year: Never true  Transportation Needs: No Transportation Needs   Lack of Transportation (Medical): No   Lack of Transportation (Non-Medical): No  Physical Activity: Inactive   Days of Exercise per Week: 0 days   Minutes of Exercise per Session: 0 min  Stress: No Stress Concern Present   Feeling of Stress : Not at all  Social Connections: Not on file  Intimate Partner Violence: Not on file    Current Outpatient Medications on File Prior to Visit  Medication Sig Dispense Refill   Alpha-Lipoic Acid 600 MG CAPS Take 600 mg by mouth every morning.      ALPRAZolam (XANAX) 0.5 MG tablet TAKE 1 TABLET BY MOUTH TWICE A DAY AS NEEDED FOR ANXIETY 60 tablet 1   amoxicillin (AMOXIL) 500 MG capsule Take 4 capsules (2,000 mg total) by mouth daily as needed (before dental procedure). 4 capsule 4   Carboxymethylcellul-Glycerin (LUBRICATING EYE DROPS OP) Place 1 drop into both eyes 2 (two) times a day.     cholecalciferol (VITAMIN D3) 25 MCG (1000 UT) tablet Take  1,000 Units by mouth daily.     diltiazem (CARDIZEM CD) 120 MG 24 hr capsule Take 1 capsule (120 mg total) by mouth daily. 90 capsule 3   ELIQUIS 5 MG TABS tablet SMARTSIG:1 Tablet(s) By Mouth Morning-Night     FINACEA 15 % FOAM Apply topically daily.     Multiple Vitamin (MULTIVITAMIN WITH MINERALS) TABS tablet Take 1 tablet by mouth daily in the afternoon. Centrum     MYRBETRIQ 25 MG TB24 tablet Take 25 mg by mouth daily.     OVER THE COUNTER MEDICATION Take 1 tablet by mouth at bedtime as needed (sleep). GNC PREVENTIVE NUTRITION TRI-SLEEP     polyethylene glycol (MIRALAX / GLYCOLAX) packet Take 17 g by mouth 2 (two) times daily  as needed (constipation).      rosuvastatin (CRESTOR) 10 MG tablet TAKE 1 TABLET BY MOUTH EVERY DAY 90 tablet 2   tretinoin (RETIN-A) 0.1 % cream Apply 1 application topically at bedtime.   0   triamcinolone cream (KENALOG) 0.1 % APPLY TWICE DAILY TO AFFECTED RASH AS NEEDED     meclizine (ANTIVERT) 25 MG tablet Take 1 tablet (25 mg total) by mouth 3 (three) times daily as needed for dizziness. (Patient not taking: No sig reported) 30 tablet 3   No current facility-administered medications on file prior to visit.    Allergies  Allergen Reactions   Nickel Itching       Observations/Objective: Today's Vitals   02/20/21 1319  PainSc: 0-No pain   There is no height or weight on file to calculate BMI.  Physical Exam Neurological:     Mental Status: He is alert.    CBC    Component Value Date/Time   WBC 5.6 05/17/2020 1211   RBC 4.43 05/17/2020 1211   HGB 14.4 05/17/2020 1211   HGB 14.4 02/03/2019 1454   HCT 42.8 05/17/2020 1211   HCT 42.3 02/03/2019 1454   PLT 168 05/17/2020 1211   PLT 157 02/03/2019 1454   MCV 96.6 05/17/2020 1211   MCV 94 02/03/2019 1454   MCH 32.5 05/17/2020 1211   MCHC 33.6 05/17/2020 1211   RDW 13.0 05/17/2020 1211   RDW 12.1 02/03/2019 1454   LYMPHSABS 0.9 02/14/2020 0945   MONOABS 0.5 02/14/2020 0945   EOSABS 0.5  02/14/2020 0945   BASOSABS 0.1 02/14/2020 0945    CMP     Component Value Date/Time   NA 138 11/04/2020 1054   NA 138 02/03/2019 1454   NA 139 11/09/2015 0000   K 4.2 11/04/2020 1054   K 4.5 11/09/2015 0000   CL 104 11/04/2020 1054   CL 101 11/09/2015 0000   CO2 25 11/04/2020 1054   CO2 25 11/09/2015 0000   GLUCOSE 97 11/04/2020 1054   BUN 16 11/04/2020 1054   BUN 17 02/03/2019 1454   BUN 20 11/09/2015 0000   CREATININE 0.87 11/04/2020 1054   CALCIUM 9.2 11/04/2020 1054   CALCIUM 8.9 11/09/2015 0000   PROT 7.2 11/04/2020 1054   PROT 7.6 02/03/2019 1454   ALBUMIN 4.3 02/14/2020 0945   ALBUMIN 4.5 02/03/2019 1454   AST 25 11/04/2020 1054   ALT 19 11/04/2020 1054   ALKPHOS 69 02/14/2020 0945   BILITOT 1.3 (H) 11/04/2020 1054   BILITOT 0.9 02/03/2019 1454   GFRNONAA >60 05/17/2020 1211   GFRNONAA 78 03/18/2020 0812   GFRAA 91 03/18/2020 0812     Assessment and Plan: 1. MGUS (monoclonal gammopathy of unknown significance)    #IgG Kappa MGUS. I discussed with patient about the diagnosis of IgG MGUS which is an asymptomatic condition which has a small risk of progression to smoldering multiple myeloma and to symptomatic multiple myeloma. Less frequently, these patients progress to AL amyloidosis, light chain deposition disease, or another lymphoproliferative disorder. For now I recommend observation.  Patient is in the process of [place] to another state.  His M protein was checked last year and has been stable around 0.6-0.7 I recommend observation.  He may ask his future primary care provider or hematology specialist to check CBC, CMP SPEP and light chain ratio every 6-12 months.  Patient appreciates the explanation and agrees with the plan.  Follow Up Instructions: Patient will be discharged from our clinic as he is  moving out of the state.   I discussed the assessment and treatment plan with the patient. The patient was provided an opportunity to ask questions and all  were answered. The patient agreed with the plan and demonstrated an understanding of the instructions.  The patient was advised to call back or seek an in-person evaluation if the symptoms worsen or if the condition fails to improve as anticipated.    Earlie Server, MD 02/21/2021 10:26 AM

## 2021-03-03 ENCOUNTER — Other Ambulatory Visit: Payer: Self-pay | Admitting: Family Medicine

## 2021-03-03 ENCOUNTER — Other Ambulatory Visit: Payer: Self-pay | Admitting: Urology

## 2021-03-03 MED ORDER — MYRBETRIQ 25 MG PO TB24: 25.0000 mg | ORAL_TABLET | Freq: Every day | ORAL | 4 refills | Status: AC

## 2021-03-03 NOTE — Telephone Encounter (Signed)
Patient called and is moving to another states. He requested Myrbetriq to be sent to the pharmacy with 4 refills so he can get established with a urologist after he has moved and settled. RX sent to pharmacy.

## 2021-03-05 ENCOUNTER — Telehealth: Payer: Self-pay | Admitting: Cardiovascular Disease

## 2021-03-05 MED ORDER — ELIQUIS 5 MG PO TABS: 5.0000 mg | ORAL_TABLET | Freq: Two times a day (BID) | ORAL | 1 refills | Status: AC

## 2021-03-05 NOTE — Telephone Encounter (Signed)
Refill Request.  

## 2021-03-05 NOTE — Telephone Encounter (Signed)
Prescription refill request for Eliquis received. Indication: aflutter Last office visit: [place], 12/11/2020 Scr: 0.87, 11/04/2020 Age: 80 yo  Weight: 80.3 kg   Refill sent.

## 2021-03-05 NOTE — Telephone Encounter (Signed)
*  STAT* If patient is at the pharmacy, call can be transferred to refill team.   1. Which medications need to be refilled? (please list name of each medication and dose if known)   Eliquis 5 mg po BID  2. Which pharmacy/location (including street and city if local pharmacy) is medication to be sent to? [facility] [address]   3. Do they need a 30 day or 90 day supply? 90 with 3-4 refills   Patient moving to [place] and will need time to establish   Patient aware if not approved he can call and have sent to a new pharmacy in [place] next time.

## 2021-05-09 ENCOUNTER — Other Ambulatory Visit: Payer: Self-pay | Admitting: Cardiovascular Disease

## 2021-05-12 NOTE — Telephone Encounter (Signed)
Attempted to schedule no ans no vm  

## 2021-05-12 NOTE — Telephone Encounter (Signed)
Please schedule 6 month F/U appointment. Thank you! 

## 2021-05-23 ENCOUNTER — Ambulatory Visit: Payer: Self-pay | Admitting: Urology

## 2021-07-24 NOTE — Telephone Encounter (Signed)
Declined fu moved to [place].  ?

## 2021-07-25 NOTE — Telephone Encounter (Signed)
Noted  

## 2022-02-03 ENCOUNTER — Ambulatory Visit: Payer: Medicare Other
